# Patient Record
Sex: Female | Born: 1960 | Race: Black or African American | Hispanic: No | Marital: Married | Smoking: Never smoker
Health system: Southern US, Community
[De-identification: ages and names within clinical notes are randomized; demographics above are authoritative.]

## PROBLEM LIST (undated history)

## (undated) DIAGNOSIS — K219 Gastro-esophageal reflux disease without esophagitis: Secondary | ICD-10-CM

## (undated) DIAGNOSIS — A419 Sepsis, unspecified organism: Secondary | ICD-10-CM

## (undated) DIAGNOSIS — F988 Other specified behavioral and emotional disorders with onset usually occurring in childhood and adolescence: Secondary | ICD-10-CM

## (undated) DIAGNOSIS — E119 Type 2 diabetes mellitus without complications: Secondary | ICD-10-CM

## (undated) DIAGNOSIS — G4733 Obstructive sleep apnea (adult) (pediatric): Secondary | ICD-10-CM

## (undated) DIAGNOSIS — E7800 Pure hypercholesterolemia, unspecified: Secondary | ICD-10-CM

## (undated) DIAGNOSIS — M549 Dorsalgia, unspecified: Secondary | ICD-10-CM

## (undated) DIAGNOSIS — C73 Malignant neoplasm of thyroid gland: Secondary | ICD-10-CM

## (undated) DIAGNOSIS — Z923 Personal history of irradiation: Secondary | ICD-10-CM

## (undated) DIAGNOSIS — D649 Anemia, unspecified: Secondary | ICD-10-CM

## (undated) DIAGNOSIS — C50919 Malignant neoplasm of unspecified site of unspecified female breast: Secondary | ICD-10-CM

## (undated) DIAGNOSIS — M1990 Unspecified osteoarthritis, unspecified site: Secondary | ICD-10-CM

## (undated) DIAGNOSIS — K760 Fatty (change of) liver, not elsewhere classified: Secondary | ICD-10-CM

## (undated) DIAGNOSIS — I10 Essential (primary) hypertension: Secondary | ICD-10-CM

## (undated) DIAGNOSIS — E042 Nontoxic multinodular goiter: Secondary | ICD-10-CM

## (undated) DIAGNOSIS — K429 Umbilical hernia without obstruction or gangrene: Secondary | ICD-10-CM

## (undated) DIAGNOSIS — F419 Anxiety disorder, unspecified: Secondary | ICD-10-CM

## (undated) DIAGNOSIS — E559 Vitamin D deficiency, unspecified: Secondary | ICD-10-CM

## (undated) DIAGNOSIS — Z91018 Allergy to other foods: Secondary | ICD-10-CM

## (undated) DIAGNOSIS — F32A Depression, unspecified: Secondary | ICD-10-CM

## (undated) DIAGNOSIS — F329 Major depressive disorder, single episode, unspecified: Secondary | ICD-10-CM

## (undated) DIAGNOSIS — A403 Sepsis due to Streptococcus pneumoniae: Secondary | ICD-10-CM

## (undated) DIAGNOSIS — E039 Hypothyroidism, unspecified: Secondary | ICD-10-CM

## (undated) DIAGNOSIS — R6521 Severe sepsis with septic shock: Secondary | ICD-10-CM

## (undated) DIAGNOSIS — Z9989 Dependence on other enabling machines and devices: Secondary | ICD-10-CM

## (undated) DIAGNOSIS — M2550 Pain in unspecified joint: Secondary | ICD-10-CM

## (undated) DIAGNOSIS — G43909 Migraine, unspecified, not intractable, without status migrainosus: Secondary | ICD-10-CM

## (undated) DIAGNOSIS — J189 Pneumonia, unspecified organism: Secondary | ICD-10-CM

## (undated) HISTORY — PX: ABDOMINAL HYSTERECTOMY: SHX81

## (undated) HISTORY — DX: Malignant neoplasm of unspecified site of unspecified female breast: C50.919

## (undated) HISTORY — DX: Pain in unspecified joint: M25.50

## (undated) HISTORY — DX: Anxiety disorder, unspecified: F41.9

## (undated) HISTORY — DX: Major depressive disorder, single episode, unspecified: F32.9

## (undated) HISTORY — DX: Fatty (change of) liver, not elsewhere classified: K76.0

## (undated) HISTORY — DX: Other specified behavioral and emotional disorders with onset usually occurring in childhood and adolescence: F98.8

## (undated) HISTORY — DX: Dorsalgia, unspecified: M54.9

## (undated) HISTORY — DX: Sepsis, unspecified organism: A41.9

## (undated) HISTORY — DX: Essential (primary) hypertension: I10

## (undated) HISTORY — DX: Sepsis, unspecified organism: R65.21

## (undated) HISTORY — DX: Allergy to other foods: Z91.018

## (undated) HISTORY — DX: Sepsis due to Streptococcus pneumoniae: A40.3

## (undated) HISTORY — DX: Vitamin D deficiency, unspecified: E55.9

## (undated) HISTORY — PX: BREAST REDUCTION SURGERY: SHX8

## (undated) HISTORY — DX: Anemia, unspecified: D64.9

## (undated) HISTORY — DX: Depression, unspecified: F32.A

## (undated) HISTORY — DX: Migraine, unspecified, not intractable, without status migrainosus: G43.909

## (undated) HISTORY — DX: Umbilical hernia without obstruction or gangrene: K42.9

---

## 1984-07-16 HISTORY — PX: DIAPHRAGM SURGERY: SHX612

## 1993-07-16 HISTORY — PX: REDUCTION MAMMAPLASTY: SUR839

## 1998-06-17 ENCOUNTER — Encounter: Payer: Self-pay | Admitting: Obstetrics and Gynecology

## 1998-06-17 ENCOUNTER — Ambulatory Visit (HOSPITAL_COMMUNITY): Admission: RE | Admit: 1998-06-17 | Discharge: 1998-06-17 | Payer: Self-pay | Admitting: Obstetrics and Gynecology

## 1998-12-19 ENCOUNTER — Emergency Department (HOSPITAL_COMMUNITY): Admission: EM | Admit: 1998-12-19 | Discharge: 1998-12-19 | Payer: Self-pay | Admitting: Emergency Medicine

## 1998-12-20 ENCOUNTER — Emergency Department (HOSPITAL_COMMUNITY): Admission: EM | Admit: 1998-12-20 | Discharge: 1998-12-20 | Payer: Self-pay | Admitting: Emergency Medicine

## 2000-05-22 ENCOUNTER — Encounter (HOSPITAL_BASED_OUTPATIENT_CLINIC_OR_DEPARTMENT_OTHER): Payer: Self-pay | Admitting: General Surgery

## 2000-05-22 ENCOUNTER — Encounter: Admission: RE | Admit: 2000-05-22 | Discharge: 2000-05-22 | Payer: Self-pay | Admitting: General Surgery

## 2000-10-30 ENCOUNTER — Emergency Department (HOSPITAL_COMMUNITY): Admission: EM | Admit: 2000-10-30 | Discharge: 2000-10-30 | Payer: Self-pay

## 2001-03-20 ENCOUNTER — Emergency Department (HOSPITAL_COMMUNITY): Admission: EM | Admit: 2001-03-20 | Discharge: 2001-03-20 | Payer: Self-pay | Admitting: Emergency Medicine

## 2006-04-26 ENCOUNTER — Emergency Department (HOSPITAL_COMMUNITY): Admission: EM | Admit: 2006-04-26 | Discharge: 2006-04-26 | Payer: Self-pay | Admitting: Family Medicine

## 2007-07-17 HISTORY — PX: BREAST LUMPECTOMY: SHX2

## 2008-04-15 ENCOUNTER — Encounter: Admission: RE | Admit: 2008-04-15 | Discharge: 2008-04-15 | Payer: Self-pay | Admitting: Internal Medicine

## 2008-05-03 ENCOUNTER — Encounter: Admission: RE | Admit: 2008-05-03 | Discharge: 2008-05-03 | Payer: Self-pay | Admitting: Internal Medicine

## 2008-05-21 ENCOUNTER — Encounter: Admission: RE | Admit: 2008-05-21 | Discharge: 2008-05-21 | Payer: Self-pay | Admitting: General Surgery

## 2008-05-24 ENCOUNTER — Encounter (HOSPITAL_BASED_OUTPATIENT_CLINIC_OR_DEPARTMENT_OTHER): Payer: Self-pay | Admitting: General Surgery

## 2008-05-24 ENCOUNTER — Ambulatory Visit (HOSPITAL_BASED_OUTPATIENT_CLINIC_OR_DEPARTMENT_OTHER): Admission: RE | Admit: 2008-05-24 | Discharge: 2008-05-24 | Payer: Self-pay | Admitting: General Surgery

## 2008-05-24 ENCOUNTER — Encounter: Admission: RE | Admit: 2008-05-24 | Discharge: 2008-05-24 | Payer: Self-pay | Admitting: General Surgery

## 2008-06-08 ENCOUNTER — Ambulatory Visit: Payer: Self-pay | Admitting: Oncology

## 2008-06-09 ENCOUNTER — Ambulatory Visit: Admission: RE | Admit: 2008-06-09 | Discharge: 2008-08-26 | Payer: Self-pay | Admitting: Radiation Oncology

## 2008-07-19 LAB — COMPREHENSIVE METABOLIC PANEL
ALT: 23 U/L (ref 0–35)
AST: 20 U/L (ref 0–37)
Albumin: 4.3 g/dL (ref 3.5–5.2)
Alkaline Phosphatase: 55 U/L (ref 39–117)
BUN: 10 mg/dL (ref 6–23)
CO2: 22 mEq/L (ref 19–32)
Calcium: 9.6 mg/dL (ref 8.4–10.5)
Chloride: 99 mEq/L (ref 96–112)
Creatinine, Ser: 0.81 mg/dL (ref 0.40–1.20)
Glucose, Bld: 151 mg/dL — ABNORMAL HIGH (ref 70–99)
Potassium: 4.1 mEq/L (ref 3.5–5.3)
Sodium: 135 mEq/L (ref 135–145)
Total Bilirubin: 0.3 mg/dL (ref 0.3–1.2)
Total Protein: 7.7 g/dL (ref 6.0–8.3)

## 2008-10-26 ENCOUNTER — Ambulatory Visit: Payer: Self-pay | Admitting: Oncology

## 2009-03-01 ENCOUNTER — Ambulatory Visit: Payer: Self-pay | Admitting: Oncology

## 2009-03-03 LAB — FOLLICLE STIMULATING HORMONE: FSH: 46.8 m[IU]/mL

## 2009-03-03 LAB — LUTEINIZING HORMONE: LH: 39.1 m[IU]/mL

## 2009-03-13 LAB — ESTRADIOL, ULTRA SENS

## 2009-06-13 ENCOUNTER — Ambulatory Visit: Payer: Self-pay | Admitting: Oncology

## 2009-06-14 ENCOUNTER — Encounter: Admission: RE | Admit: 2009-06-14 | Discharge: 2009-06-14 | Payer: Self-pay | Admitting: Oncology

## 2009-06-15 LAB — CBC WITH DIFFERENTIAL/PLATELET
BASO%: 0.2 % (ref 0.0–2.0)
Basophils Absolute: 0 10*3/uL (ref 0.0–0.1)
EOS%: 2.3 % (ref 0.0–7.0)
Eosinophils Absolute: 0.2 10*3/uL (ref 0.0–0.5)
HCT: 39.2 % (ref 34.8–46.6)
HGB: 12.7 g/dL (ref 11.6–15.9)
LYMPH%: 12.5 % — ABNORMAL LOW (ref 14.0–49.7)
MCH: 24.9 pg — ABNORMAL LOW (ref 25.1–34.0)
MCHC: 32.5 g/dL (ref 31.5–36.0)
MCV: 76.8 fL — ABNORMAL LOW (ref 79.5–101.0)
MONO#: 0.7 10*3/uL (ref 0.1–0.9)
MONO%: 8.1 % (ref 0.0–14.0)
NEUT#: 6.5 10*3/uL (ref 1.5–6.5)
NEUT%: 76.9 % — ABNORMAL HIGH (ref 38.4–76.8)
Platelets: 272 10*3/uL (ref 145–400)
RBC: 5.11 10*6/uL (ref 3.70–5.45)
RDW: 16.6 % — ABNORMAL HIGH (ref 11.2–14.5)
WBC: 8.4 10*3/uL (ref 3.9–10.3)
lymph#: 1.1 10*3/uL (ref 0.9–3.3)

## 2009-06-15 LAB — COMPREHENSIVE METABOLIC PANEL
ALT: 54 U/L — ABNORMAL HIGH (ref 0–35)
AST: 31 U/L (ref 0–37)
Albumin: 4.3 g/dL (ref 3.5–5.2)
Alkaline Phosphatase: 60 U/L (ref 39–117)
BUN: 22 mg/dL (ref 6–23)
CO2: 29 mEq/L (ref 19–32)
Calcium: 9.8 mg/dL (ref 8.4–10.5)
Chloride: 97 mEq/L (ref 96–112)
Creatinine, Ser: 0.96 mg/dL (ref 0.40–1.20)
Glucose, Bld: 260 mg/dL — ABNORMAL HIGH (ref 70–99)
Potassium: 4.2 mEq/L (ref 3.5–5.3)
Sodium: 137 mEq/L (ref 135–145)
Total Bilirubin: 0.2 mg/dL — ABNORMAL LOW (ref 0.3–1.2)
Total Protein: 7.2 g/dL (ref 6.0–8.3)

## 2009-06-15 LAB — FOLLICLE STIMULATING HORMONE: FSH: 64.9 m[IU]/mL

## 2009-06-27 LAB — ESTRADIOL, ULTRA SENS: Estradiol, Ultra Sensitive: <2 pg/mL

## 2010-06-09 ENCOUNTER — Ambulatory Visit: Payer: Self-pay | Admitting: Oncology

## 2010-06-16 ENCOUNTER — Encounter: Admission: RE | Admit: 2010-06-16 | Discharge: 2010-06-16 | Payer: Self-pay | Admitting: Oncology

## 2010-06-19 LAB — CBC WITH DIFFERENTIAL/PLATELET
BASO%: 0.4 % (ref 0.0–2.0)
Basophils Absolute: 0 10*3/uL (ref 0.0–0.1)
EOS%: 3.8 % (ref 0.0–7.0)
Eosinophils Absolute: 0.3 10*3/uL (ref 0.0–0.5)
HCT: 39.6 % (ref 34.8–46.6)
HGB: 12.8 g/dL (ref 11.6–15.9)
LYMPH%: 14.4 % (ref 14.0–49.7)
MCH: 24.3 pg — ABNORMAL LOW (ref 25.1–34.0)
MCHC: 32.3 g/dL (ref 31.5–36.0)
MCV: 75.1 fL — ABNORMAL LOW (ref 79.5–101.0)
MONO#: 0.5 10*3/uL (ref 0.1–0.9)
MONO%: 6.6 % (ref 0.0–14.0)
NEUT#: 6.2 10*3/uL (ref 1.5–6.5)
NEUT%: 74.8 % (ref 38.4–76.8)
Platelets: 320 10*3/uL (ref 145–400)
RBC: 5.27 10*6/uL (ref 3.70–5.45)
RDW: 15.5 % — ABNORMAL HIGH (ref 11.2–14.5)
WBC: 8.3 10*3/uL (ref 3.9–10.3)
lymph#: 1.2 10*3/uL (ref 0.9–3.3)

## 2010-06-19 LAB — COMPREHENSIVE METABOLIC PANEL
ALT: 38 U/L — ABNORMAL HIGH (ref 0–35)
AST: 26 U/L (ref 0–37)
Albumin: 4 g/dL (ref 3.5–5.2)
Alkaline Phosphatase: 57 U/L (ref 39–117)
BUN: 20 mg/dL (ref 6–23)
CO2: 32 mEq/L (ref 19–32)
Calcium: 9.7 mg/dL (ref 8.4–10.5)
Chloride: 100 mEq/L (ref 96–112)
Creatinine, Ser: 1.03 mg/dL (ref 0.40–1.20)
Glucose, Bld: 203 mg/dL — ABNORMAL HIGH (ref 70–99)
Potassium: 4 mEq/L (ref 3.5–5.3)
Sodium: 141 mEq/L (ref 135–145)
Total Bilirubin: 0.3 mg/dL (ref 0.3–1.2)
Total Protein: 7.3 g/dL (ref 6.0–8.3)

## 2010-06-19 LAB — FOLLICLE STIMULATING HORMONE: FSH: 54.3 m[IU]/mL

## 2010-06-19 LAB — CANCER ANTIGEN 27.29: CA 27.29: 25 U/mL (ref 0–39)

## 2010-06-19 LAB — VITAMIN D 25 HYDROXY (VIT D DEFICIENCY, FRACTURES): Vit D, 25-Hydroxy: 66 ng/mL (ref 30–89)

## 2010-06-24 LAB — ESTRADIOL, ULTRA SENS: Estradiol, Ultra Sensitive: 24 pg/mL

## 2010-11-28 NOTE — Op Note (Signed)
Brittany, [name]:  [id number]   MEDICAL RECORD NO.:  [id number]          PATIENT TYPE:  AMB   LOCATION:  DSC                          FACILITY:  [facility]   PHYSICIAN:  Leonie Man, M.D.   DATE OF BIRTH:  01/21/1961   DATE OF PROCEDURE:  05/24/2008  DATE OF DISCHARGE:                               OPERATIVE REPORT   PREOPERATIVE DIAGNOSIS:  Papilloma of the left breast.   POSTOPERATIVE DIAGNOSIS:  Papilloma of the left breast.   PATHOLOGY:  Pending.   PROCEDURE:  Needle localized excision of left breast lesion.   SURGEON:  Leonie Man, MD   ASSISTANT:  OR nurse.   ANESTHESIA:  General.   NOTES AND INDICATIONS:  Ms. Badgett is a 50 year old female who is status  post excisional biopsy of the left breast in the remote past for  papillomatosis.  She is currently status post bilateral reduction  mammoplasties and on mammogram in early October 2009 was seen to have an  area of pleomorphic calcifications in the upper outer quadrant of the  breast.  The calcifications were biopsied by stereotactic core biopsy  with pathology showing a papilloma and addition to this, there was some  columnar ulceration without atypia, apocrine metaplasia and duct  ectasia.  There was also some a lobular hyperplasia without atypia.   Because of the presence of the intraductal papilloma, she comes now to  the operating room following needle localization for excision of this  lesion.   PROCEDURE:  The patient was positioned supinely following the induction  of general endotracheal anesthesia.  The left breast was prepped and  draped to be included in a sterile operative field.  The needle  insertion site was somewhat high and close to the anterior axillary  line.  I made an incision below this into the breast tissue, deepened  this through the skin and into the subcutaneous tissue raising a flap  toward the localizing needle.  The localizing needle now brought down  into the wound.  Large wedge of breast tissue was taken with the  localizing needle.  This was forwarded for pathologic evaluation after  specimen mammography showed the lesion to be centrally placed in the  breast and the clip was present.   Hemostasis was then obtained with electrocautery.  Sponge and instrument  counts to pathology verified.  Incision closed in three layers using  interrupted 2-0 Vicryl sutures in the deep breast layers, interrupted 3-  0 Vicryl in the  subcutaneous layers and 5-0 Monocryl in the skin.  This was reinforced  with Dermabond and Steri-Strips.  Sterile dressings were applied.  The  anesthetic reversed.  The patient removed from the operating room to the  recovery room in stable condition.  She tolerated the procedure well.      Leonie Man, M.D.  Electronically Signed     PB/MEDQ  D:  05/24/2008  T:  05/25/2008  Job:  [id number]

## 2011-04-17 LAB — COMPREHENSIVE METABOLIC PANEL
ALT: 19
AST: 21
Albumin: 3.6
Alkaline Phosphatase: 50
BUN: 8
CO2: 31
Calcium: 9.3
Chloride: 98
Creatinine, Ser: 0.71
GFR calc Af Amer: >60
GFR calc non Af Amer: >60
Glucose, Bld: 243 — ABNORMAL HIGH
Potassium: 4.3
Sodium: 135
Total Bilirubin: 0.6
Total Protein: 7.1

## 2011-04-17 LAB — DIFFERENTIAL
Basophils Absolute: 0
Basophils Relative: 0
Eosinophils Absolute: 0.2
Eosinophils Relative: 3
Lymphocytes Relative: 22
Lymphs Abs: 2.1
Monocytes Absolute: 0.7
Monocytes Relative: 7
Neutro Abs: 6.5
Neutrophils Relative %: 68

## 2011-04-17 LAB — CBC
HCT: 39.4
Hemoglobin: 12.3
MCHC: 31.3
MCV: 76.4 — ABNORMAL LOW
Platelets: 315
RBC: 5.16 — ABNORMAL HIGH
RDW: 15.8 — ABNORMAL HIGH
WBC: 9.5

## 2011-04-17 LAB — GLUCOSE, CAPILLARY
Glucose-Capillary: 152 — ABNORMAL HIGH
Glucose-Capillary: 170 — ABNORMAL HIGH

## 2011-06-14 ENCOUNTER — Other Ambulatory Visit: Payer: Self-pay | Admitting: Oncology

## 2011-06-14 DIAGNOSIS — Z853 Personal history of malignant neoplasm of breast: Secondary | ICD-10-CM

## 2011-06-29 ENCOUNTER — Ambulatory Visit
Admission: RE | Admit: 2011-06-29 | Discharge: 2011-06-29 | Disposition: A | Discharge status: Home / Self Care | Payer: BC Managed Care – PPO | Source: Ambulatory Visit | Attending: Oncology | Admitting: Oncology

## 2011-06-29 DIAGNOSIS — Z853 Personal history of malignant neoplasm of breast: Secondary | ICD-10-CM

## 2011-07-05 ENCOUNTER — Other Ambulatory Visit: Payer: BC Managed Care – PPO | Admitting: Lab

## 2011-07-05 ENCOUNTER — Other Ambulatory Visit: Payer: Self-pay | Admitting: Oncology

## 2011-07-05 ENCOUNTER — Ambulatory Visit (HOSPITAL_BASED_OUTPATIENT_CLINIC_OR_DEPARTMENT_OTHER): Payer: BC Managed Care – PPO | Admitting: Oncology

## 2011-07-05 VITALS — BP 143/82 | HR 85 | Temp 98.6°F | Ht 65.0 in | Wt 253.1 lb

## 2011-07-05 DIAGNOSIS — D0590 Unspecified type of carcinoma in situ of unspecified breast: Secondary | ICD-10-CM

## 2011-07-05 DIAGNOSIS — C50419 Malignant neoplasm of upper-outer quadrant of unspecified female breast: Secondary | ICD-10-CM

## 2011-07-05 DIAGNOSIS — K219 Gastro-esophageal reflux disease without esophagitis: Secondary | ICD-10-CM

## 2011-07-05 DIAGNOSIS — Z853 Personal history of malignant neoplasm of breast: Secondary | ICD-10-CM

## 2011-07-05 DIAGNOSIS — Z170 Estrogen receptor positive status [ER+]: Secondary | ICD-10-CM

## 2011-07-05 DIAGNOSIS — C50919 Malignant neoplasm of unspecified site of unspecified female breast: Secondary | ICD-10-CM | POA: Insufficient documentation

## 2011-07-05 DIAGNOSIS — Z923 Personal history of irradiation: Secondary | ICD-10-CM

## 2011-07-05 LAB — CBC WITH DIFFERENTIAL/PLATELET
BASO%: 0.2 % (ref 0.0–2.0)
Basophils Absolute: 0 10*3/uL (ref 0.0–0.1)
EOS%: 3.9 % (ref 0.0–7.0)
Eosinophils Absolute: 0.3 10*3/uL (ref 0.0–0.5)
HCT: 38.6 % (ref 34.8–46.6)
HGB: 12.4 g/dL (ref 11.6–15.9)
LYMPH%: 14.8 % (ref 14.0–49.7)
MCH: 23.7 pg — ABNORMAL LOW (ref 25.1–34.0)
MCHC: 32.1 g/dL (ref 31.5–36.0)
MCV: 73.8 fL — ABNORMAL LOW (ref 79.5–101.0)
MONO#: 0.7 10*3/uL (ref 0.1–0.9)
MONO%: 9.4 % (ref 0.0–14.0)
NEUT#: 5.5 10*3/uL (ref 1.5–6.5)
NEUT%: 71.7 % (ref 38.4–76.8)
Platelets: 253 10*3/uL (ref 145–400)
RBC: 5.23 10*6/uL (ref 3.70–5.45)
RDW: 16.4 % — ABNORMAL HIGH (ref 11.2–14.5)
WBC: 7.6 10*3/uL (ref 3.9–10.3)
lymph#: 1.1 10*3/uL (ref 0.9–3.3)

## 2011-07-05 LAB — FERRITIN: Ferritin: 25 ng/mL (ref 10–291)
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
## 2011-07-06 ENCOUNTER — Telehealth: Payer: Self-pay | Admitting: *Deleted

## 2011-07-06 NOTE — Telephone Encounter (Signed)
gave patient appointment for 11-2011 with the labs a week before doctor's visit

## 2011-11-19 ENCOUNTER — Other Ambulatory Visit (HOSPITAL_BASED_OUTPATIENT_CLINIC_OR_DEPARTMENT_OTHER): Payer: BC Managed Care – PPO | Admitting: Lab

## 2011-11-19 DIAGNOSIS — C50919 Malignant neoplasm of unspecified site of unspecified female breast: Secondary | ICD-10-CM

## 2011-11-19 DIAGNOSIS — C50419 Malignant neoplasm of upper-outer quadrant of unspecified female breast: Secondary | ICD-10-CM

## 2011-11-19 DIAGNOSIS — Z853 Personal history of malignant neoplasm of breast: Secondary | ICD-10-CM

## 2011-11-19 LAB — COMPREHENSIVE METABOLIC PANEL
ALT: 49 U/L — ABNORMAL HIGH (ref 0–35)
AST: 33 U/L (ref 0–37)
Albumin: 4.1 g/dL (ref 3.5–5.2)
Alkaline Phosphatase: 56 U/L (ref 39–117)
BUN: 16 mg/dL (ref 6–23)
CO2: 32 mEq/L (ref 19–32)
Calcium: 9.3 mg/dL (ref 8.4–10.5)
Chloride: 97 mEq/L (ref 96–112)
Creatinine, Ser: 0.95 mg/dL (ref 0.50–1.10)
Glucose, Bld: 331 mg/dL — ABNORMAL HIGH (ref 70–99)
Potassium: 4.3 mEq/L (ref 3.5–5.3)
Sodium: 136 mEq/L (ref 135–145)
Total Bilirubin: 0.3 mg/dL (ref 0.3–1.2)
Total Protein: 7.2 g/dL (ref 6.0–8.3)

## 2011-11-19 LAB — CBC WITH DIFFERENTIAL/PLATELET
BASO%: 0.4 % (ref 0.0–2.0)
Basophils Absolute: 0 10*3/uL (ref 0.0–0.1)
EOS%: 3.3 % (ref 0.0–7.0)
Eosinophils Absolute: 0.3 10*3/uL (ref 0.0–0.5)
HCT: 39.9 % (ref 34.8–46.6)
HGB: 12.7 g/dL (ref 11.6–15.9)
LYMPH%: 17.5 % (ref 14.0–49.7)
MCH: 23.4 pg — ABNORMAL LOW (ref 25.1–34.0)
MCHC: 31.8 g/dL (ref 31.5–36.0)
MCV: 73.6 fL — ABNORMAL LOW (ref 79.5–101.0)
MONO#: 0.8 10*3/uL (ref 0.1–0.9)
MONO%: 9.2 % (ref 0.0–14.0)
NEUT#: 5.8 10*3/uL (ref 1.5–6.5)
NEUT%: 69.6 % (ref 38.4–76.8)
Platelets: 283 10*3/uL (ref 145–400)
RBC: 5.43 10*6/uL (ref 3.70–5.45)
RDW: 16.6 % — ABNORMAL HIGH (ref 11.2–14.5)
WBC: 8.4 10*3/uL (ref 3.9–10.3)
lymph#: 1.5 10*3/uL (ref 0.9–3.3)
nRBC: 0 % (ref 0–0)

## 2011-11-26 ENCOUNTER — Telehealth: Payer: Self-pay | Admitting: *Deleted

## 2011-11-26 ENCOUNTER — Encounter: Payer: Self-pay | Admitting: Physician Assistant

## 2011-11-26 ENCOUNTER — Ambulatory Visit (HOSPITAL_BASED_OUTPATIENT_CLINIC_OR_DEPARTMENT_OTHER): Payer: BC Managed Care – PPO | Admitting: Physician Assistant

## 2011-11-26 VITALS — BP 151/91 | HR 103 | Temp 98.5°F | Ht 65.0 in | Wt 251.1 lb

## 2011-11-26 DIAGNOSIS — C50919 Malignant neoplasm of unspecified site of unspecified female breast: Secondary | ICD-10-CM

## 2011-11-26 DIAGNOSIS — C50419 Malignant neoplasm of upper-outer quadrant of unspecified female breast: Secondary | ICD-10-CM

## 2011-11-26 DIAGNOSIS — E119 Type 2 diabetes mellitus without complications: Secondary | ICD-10-CM

## 2011-11-26 DIAGNOSIS — Z853 Personal history of malignant neoplasm of breast: Secondary | ICD-10-CM

## 2011-11-26 DIAGNOSIS — I10 Essential (primary) hypertension: Secondary | ICD-10-CM | POA: Insufficient documentation

## 2011-11-26 DIAGNOSIS — G43909 Migraine, unspecified, not intractable, without status migrainosus: Secondary | ICD-10-CM | POA: Insufficient documentation

## 2011-11-26 HISTORY — DX: Essential (primary) hypertension: I10

## 2011-11-26 NOTE — Telephone Encounter (Signed)
made patient appointment for mammogram at the breast center in 06-2012 gave patient appointment for 2014 printed out calendar and gave to the patient
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
## 2012-06-20 ENCOUNTER — Telehealth: Payer: Self-pay | Admitting: Emergency Medicine

## 2012-06-20 NOTE — Telephone Encounter (Signed)
Patient called with complaints of "bloody discharge in right nipple".  Per Dr Darnelle Catalan, mammogram to be rescheduled ASAP.   Patient has appointment for 12/16.   At this time [facility] for [facility] next week.

## 2012-06-24 ENCOUNTER — Telehealth: Payer: Self-pay | Admitting: Emergency Medicine

## 2012-06-24 NOTE — Telephone Encounter (Signed)
Per GM, instructed patient to keep her mammogram as scheduled for 12/16.  Instructed patient to call with any concerns in the meantime.

## 2012-06-26 ENCOUNTER — Other Ambulatory Visit: Payer: Self-pay | Admitting: *Deleted

## 2012-06-30 ENCOUNTER — Other Ambulatory Visit: Payer: Self-pay | Admitting: Physician Assistant

## 2012-06-30 ENCOUNTER — Ambulatory Visit
Admission: RE | Admit: 2012-06-30 | Discharge: 2012-06-30 | Disposition: A | Discharge status: Home / Self Care | Payer: BC Managed Care – PPO | Source: Ambulatory Visit | Attending: Physician Assistant | Admitting: Physician Assistant

## 2012-06-30 DIAGNOSIS — N6310 Unspecified lump in the right breast, unspecified quadrant: Secondary | ICD-10-CM

## 2012-06-30 DIAGNOSIS — Z853 Personal history of malignant neoplasm of breast: Secondary | ICD-10-CM

## 2012-06-30 DIAGNOSIS — C50919 Malignant neoplasm of unspecified site of unspecified female breast: Secondary | ICD-10-CM

## 2012-07-11 ENCOUNTER — Ambulatory Visit
Admission: RE | Admit: 2012-07-11 | Discharge: 2012-07-11 | Disposition: A | Discharge status: Home / Self Care | Payer: BC Managed Care – PPO | Source: Ambulatory Visit | Attending: Physician Assistant | Admitting: Physician Assistant

## 2012-07-11 DIAGNOSIS — Z853 Personal history of malignant neoplasm of breast: Secondary | ICD-10-CM

## 2012-07-11 DIAGNOSIS — N6310 Unspecified lump in the right breast, unspecified quadrant: Secondary | ICD-10-CM

## 2012-07-11 DIAGNOSIS — C50919 Malignant neoplasm of unspecified site of unspecified female breast: Secondary | ICD-10-CM

## 2012-07-16 HISTORY — PX: BREAST EXCISIONAL BIOPSY: SUR124

## 2012-07-16 HISTORY — PX: BREAST LUMPECTOMY: SHX2

## 2012-07-18 ENCOUNTER — Ambulatory Visit (INDEPENDENT_AMBULATORY_CARE_PROVIDER_SITE_OTHER): Payer: BC Managed Care – PPO | Admitting: Surgery

## 2012-07-18 ENCOUNTER — Encounter (INDEPENDENT_AMBULATORY_CARE_PROVIDER_SITE_OTHER): Payer: Self-pay | Admitting: Surgery

## 2012-07-18 VITALS — BP 140/88 | HR 80 | Temp 97.3°F | Resp 18 | Ht 65.0 in | Wt 250.6 lb

## 2012-07-18 DIAGNOSIS — D249 Benign neoplasm of unspecified breast: Secondary | ICD-10-CM

## 2012-07-18 NOTE — Progress Notes (Signed)
Patient ID: Brittany Rubio, female   DOB: 04/18/1961, 51 y.o.   MRN: [id number]  Chief Complaint  Patient presents with  . Pre-op Exam    eval R br    HPI Brittany Rubio is a 51 y.o. female.  Patient sent at the request of Dr. Jackson due to right breast nipple discharge which is bloody for 2 months. 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 Jackson in the right breast. Denies any history of breast mass, and his chronic breast pain from previous breast reduction surgery. She had a previous left breast papilloma excised in 2009. HPI  Past Medical History  Diagnosis Date  . Hypertension 11/26/2011  . Diabetes mellitus 11/26/2011  . Migraines 11/26/2011  . Cancer     Breast    Past Surgical History  Procedure Date  . Breast surgery 1995    Breast Reduction  . Breast surgery 2009    Lumpectomy  . Ruptured dia   . Diaphragm surgery 1986  . Abdominal hysterectomy ? 1997    Family History  Problem Relation Age of Onset  . Cancer Mother     Pancreatic    Social History History  Substance Use Topics  . Smoking status: Never Smoker   . Smokeless tobacco: Never Used  . Alcohol Use: Yes     Comment: social    No Known Allergies  Current Outpatient Prescriptions  Medication Sig Dispense Refill  . butorphanol (STADOL) 10 MG/ML nasal spray Place 1 spray into the nose every 4 (four) hours as needed.        . diltiazem (MATZIM LA) 240 MG 24 hr tablet Take 240 mg by mouth daily.      . levocetirizine (XYZAL) 5 MG tablet Take 5 mg by mouth every evening.      . lisinopril-hydrochlorothiazide (PRINZIDE,ZESTORETIC) 20-12.5 MG per tablet Take 1 tablet by mouth daily.        . promethazine (PHENERGAN) 25 MG tablet Take 25 mg by mouth every 6 (six) hours as needed.        . Saxagliptin-Metformin (KOMBIGLYZE XR) 2.11-998 MG TB24 Take 2 tablets by mouth daily.       . sitaGLIPtin (JANUVIA) 25 MG tablet Take 25 mg  by mouth.        Review of Systems Review of Systems  Constitutional: Negative for fever, chills and unexpected weight change.  HENT: Negative for hearing loss, congestion, sore throat, trouble swallowing and voice change.   Eyes: Negative for visual disturbance.  Respiratory: Negative for cough and wheezing.   Cardiovascular: Negative for chest pain, palpitations and leg swelling.  Gastrointestinal: Negative for nausea, vomiting, abdominal pain, diarrhea, constipation, blood in stool, abdominal distention and anal bleeding.  Genitourinary: Negative for hematuria, vaginal bleeding and difficulty urinating.  Musculoskeletal: Negative for arthralgias.  Skin: Negative for rash and wound.  Neurological: Negative for seizures, syncope and headaches.  Hematological: Negative for adenopathy. Does not bruise/bleed easily.  Psychiatric/Behavioral: Negative for confusion.    Blood pressure 140/88, pulse 80, temperature 97.3 F (36.3 C), temperature source Temporal, resp. rate 18, height 5' 5" (1.651 m), weight 250 lb 9.6 oz (113.671 kg).  Physical Exam Physical Exam  Constitutional: She is oriented to person, place, and time. She appears well-developed and well-nourished.  HENT:  Head: Normocephalic and atraumatic.  Eyes: EOM are normal. Pupils are equal, round, and reactive to light.  Neck: Normal range of motion. Neck supple.    Cardiovascular: Normal rate and regular rhythm.   Pulmonary/Chest: Right breast exhibits nipple discharge. Right breast exhibits no inverted nipple, no mass, no skin change and no tenderness. Left breast exhibits no inverted nipple, no mass, no nipple discharge and no tenderness.    Musculoskeletal: Normal range of motion.  Neurological: She is alert and oriented to person, place, and time.  Skin: Skin is warm and dry.  Psychiatric: She has a normal mood and affect. Her behavior is normal. Thought content normal.    Data Reviewed The pathology associated withthe  right breast ultrasound guided  core biopsy demonstrated an intraductal papilloma. There was no  evidence for malignancy. The pathology is concordant with imaging  findings. I have discussed the findings with the patient by  telephone and answered her questions. The patient states that her  biopsy site is clean and dry without hematoma formation. Post  biopsy wound care instructions were reviewed with the patient.  Surgical excision of the papilloma is recommended and the patient  has been scheduled to see Dr. Manessa Buley on 07/18/2012. The patient  was encouraged to call the [facility] for additional questions  or concerns.   Assessment    Right breast bloody nipple discharge and papilloma    Plan    Right breast partial mastectomy with needle localization The procedure has been discussed with the patient. Alternatives to surgery have been discussed with the patient.  Risks of surgery include bleeding,  Infection,  Seroma formation, death,  and the need for further surgery.   The patient understands and wishes to proceed.       Brittany Rubio A. 07/18/2012, 4:32 PM    

## 2012-07-18 NOTE — Patient Instructions (Signed)
Lumpectomy, Breast Conserving Surgery A lumpectomy is breast surgery that removes only part of the breast. Another name used may be partial mastectomy. The amount removed varies. Make sure you understand how much of your breast will be removed. Reasons for a lumpectomy:  Any solid breast mass.  Grouped significant nodularity that may be confused with a solitary breast mass. Lumpectomy is the most common form of breast cancer surgery today. The surgeon removes the portion of your breast which contains the tumor (cancer). This is the lump. Some normal tissue around the lump is also removed to be sure that all the tumor has been removed.  If cancer cells are found in the margins where the breast tissue was removed, your surgeon will do more surgery to remove the remaining cancer tissue. This is called re-excision surgery. Radiation and/or chemotherapy treatments are often given following a lumpectomy to kill any cancer cells that could possibly remain.  REASONS YOU MAY NOT BE ABLE TO HAVE BREAST CONSERVING SURGERY:  The tumor is located in more than one place.  Your breast is small and the tumor is large so the breast would be disfigured.  The entire tumor removal is not successful with a lumpectomy.  You cannot commit to a full course of chemotherapy, radiation therapy or are pregnant and cannot have radiation.  You have previously had radiation to the breast to treat cancer. HOW A LUMPECTOMY IS PERFORMED If overnight nursing is not required following a biopsy, a lumpectomy can be performed as a same-day surgery. This can be done in a hospital, clinic, or surgical center. The anesthesia used will depend on your surgeon. They will discuss this with you. A general anesthetic keeps you sleeping through the procedure. LET YOUR CAREGIVERS KNOW ABOUT THE FOLLOWING:  Allergies  Medications taken including herbs, eye drops, over the counter medications, and creams.  Use of steroids (by mouth or  creams)  Previous problems with anesthetics or Novocaine.  Possibility of pregnancy, if this applies  History of blood clots (thrombophlebitis)  History of bleeding or blood problems.  Previous surgery  Other health problems BEFORE THE PROCEDURE You should be present one hour prior to your procedure unless directed otherwise.  AFTER THE PROCEDURE  After surgery, you will be taken to the recovery area where a nurse will watch and check your progress. Once you're awake, stable, and taking fluids well, barring other problems you will be allowed to go home.  Ice packs applied to your operative site may help with discomfort and keep the swelling down.  A small rubber drain may be placed in the breast for a couple of days to prevent a hematoma from developing in the breast.  A pressure dressing may be applied for 24 to 48 hours to prevent bleeding.  Keep the wound dry.  You may resume a normal diet and activities as directed. Avoid strenuous activities affecting the arm on the side of the biopsy site such as tennis, swimming, heavy lifting (more than 10 pounds) or pulling.  Bruising in the breast is normal following this procedure.  Wearing a bra - even to bed - may be more comfortable and also help keep the dressing on.  Change dressings as directed.  Only take over-the-counter or prescription medicines for pain, discomfort, or fever as directed by your caregiver. Call for your results as instructed by your surgeon. Remember it is your responsibility to get the results of your lumpectomy if your surgeon asked you to follow-up. Do not assume   everything is fine if you have not heard from your caregiver. SEEK MEDICAL CARE IF:   There is increased bleeding (more than a small spot) from the wound.  You notice redness, swelling, or increasing pain in the wound.  Pus is coming from wound.  An unexplained oral temperature above 102 F (38.9 C) develops.  You notice a foul smell  coming from the wound or dressing. SEEK IMMEDIATE MEDICAL CARE IF:   You develop a rash.  You have difficulty breathing.  You have any allergic problems. Document Released: 08/13/2006 Document Revised: 09/24/2011 Document Reviewed: 11/14/2006 [employer] Patient Information 2013 [employer].  

## 2012-07-21 ENCOUNTER — Other Ambulatory Visit (INDEPENDENT_AMBULATORY_CARE_PROVIDER_SITE_OTHER): Payer: Self-pay | Admitting: Surgery

## 2012-07-31 ENCOUNTER — Other Ambulatory Visit (INDEPENDENT_AMBULATORY_CARE_PROVIDER_SITE_OTHER): Payer: Self-pay | Admitting: Surgery

## 2012-07-31 DIAGNOSIS — D249 Benign neoplasm of unspecified breast: Secondary | ICD-10-CM

## 2012-08-05 ENCOUNTER — Encounter (HOSPITAL_COMMUNITY)
Admission: RE | Admit: 2012-08-05 | Discharge: 2012-08-05 | Disposition: A | Discharge status: Home / Self Care | Payer: BC Managed Care – PPO | Source: Ambulatory Visit | Attending: Surgery | Admitting: Surgery

## 2012-08-05 ENCOUNTER — Encounter (HOSPITAL_COMMUNITY): Payer: Self-pay

## 2012-08-05 ENCOUNTER — Encounter (HOSPITAL_COMMUNITY)
Admission: RE | Admit: 2012-08-05 | Discharge: 2012-08-05 | Disposition: A | Discharge status: Home / Self Care | Payer: BC Managed Care – PPO | Source: Ambulatory Visit | Attending: Anesthesiology | Admitting: Anesthesiology

## 2012-08-05 HISTORY — DX: Unspecified osteoarthritis, unspecified site: M19.90

## 2012-08-05 HISTORY — DX: Gastro-esophageal reflux disease without esophagitis: K21.9

## 2012-08-05 LAB — CBC WITH DIFFERENTIAL/PLATELET
Basophils Absolute: 0 10*3/uL (ref 0.0–0.1)
Basophils Relative: 0 % (ref 0–1)
Eosinophils Absolute: 0.3 10*3/uL (ref 0.0–0.7)
Eosinophils Relative: 4 % (ref 0–5)
HCT: 40.3 % (ref 36.0–46.0)
Hemoglobin: 13.3 g/dL (ref 12.0–15.0)
Lymphocytes Relative: 27 % (ref 12–46)
Lymphs Abs: 2 10*3/uL (ref 0.7–4.0)
MCH: 24.2 pg — ABNORMAL LOW (ref 26.0–34.0)
MCHC: 33 g/dL (ref 30.0–36.0)
MCV: 73.4 fL — ABNORMAL LOW (ref 78.0–100.0)
Monocytes Absolute: 0.6 10*3/uL (ref 0.1–1.0)
Monocytes Relative: 8 % (ref 3–12)
Neutro Abs: 4.6 10*3/uL (ref 1.7–7.7)
Neutrophils Relative %: 62 % (ref 43–77)
Platelets: 261 10*3/uL (ref 150–400)
RBC: 5.49 MIL/uL — ABNORMAL HIGH (ref 3.87–5.11)
RDW: 15.5 % (ref 11.5–15.5)
WBC: 7.5 10*3/uL (ref 4.0–10.5)

## 2012-08-05 LAB — COMPREHENSIVE METABOLIC PANEL
ALT: 48 U/L — ABNORMAL HIGH (ref 0–35)
AST: 31 U/L (ref 0–37)
Albumin: 3.5 g/dL (ref 3.5–5.2)
Alkaline Phosphatase: 55 U/L (ref 39–117)
BUN: 12 mg/dL (ref 6–23)
CO2: 29 mEq/L (ref 19–32)
Calcium: 9.2 mg/dL (ref 8.4–10.5)
Chloride: 100 mEq/L (ref 96–112)
Creatinine, Ser: 0.67 mg/dL (ref 0.50–1.10)
GFR calc Af Amer: >90 mL/min (ref >90)
GFR calc non Af Amer: >90 mL/min (ref >90)
Glucose, Bld: 172 mg/dL — ABNORMAL HIGH (ref 70–99)
Potassium: 4.3 mEq/L (ref 3.5–5.1)
Sodium: 139 mEq/L (ref 135–145)
Total Bilirubin: 0.2 mg/dL — ABNORMAL LOW (ref 0.3–1.2)
Total Protein: 7.8 g/dL (ref 6.0–8.3)

## 2012-08-05 LAB — SURGICAL PCR SCREEN
MRSA, PCR: NEGATIVE
Staphylococcus aureus: POSITIVE — AB

## 2012-08-05 MED ORDER — CHLORHEXIDINE GLUCONATE 4 % EX LIQD: 1.0000 "application " | Freq: Once | CUTANEOUS | Status: DC

## 2012-08-05 NOTE — Progress Notes (Signed)
Contacted Dr. Allyne Gee office to request EKG.  Office stated no EKG's on record.  Forwarded EKG from 08/05/12 to anesthesia for review.

## 2012-08-05 NOTE — Pre-Procedure Instructions (Signed)
Brittany Rubio  08/05/2012   Your procedure is scheduled on:  Tuesday August 12, 2012  Report to [facility] at 12:30PM.  Call this number if you have problems the morning of surgery: [phone]   Remember:   Do not eat food or drink liquids after midnight.   Take these medicines the morning of surgery with A SIP OF WATER: stadol, diltiazem, phenergan (if needed),   Do not wear jewelry, make-up or nail polish.  Do not wear lotions, powders, or perfumes.   Do not shave 48 hours prior to surgery.  Do not bring valuables to the hospital.  Contacts, dentures or bridgework may not be worn into surgery.  Leave suitcase in the car. After surgery it may be brought to your room.  For patients admitted to the hospital, checkout time is 11:00 AM the day of  discharge.   Patients discharged the day of surgery will not be allowed to drive home.  Name and phone number of your driver: family / friend  Special Instructions: Shower using CHG 2 nights before surgery and the night before surgery.  If you shower the day of surgery use CHG.  Use special wash - you have one bottle of CHG for all showers.  You should use approximately 1/3 of the bottle for each shower.   Please read over the following fact sheets that you were given: Pain Booklet, Coughing and Deep Breathing, MRSA Information and Surgical Site Infection Prevention

## 2012-08-11 MED ORDER — DEXTROSE 5 % IV SOLN
3.0000 g | INTRAVENOUS | Status: DC
  Filled 2012-08-11: qty 3000

## 2012-08-12 ENCOUNTER — Other Ambulatory Visit (INDEPENDENT_AMBULATORY_CARE_PROVIDER_SITE_OTHER): Payer: Self-pay | Admitting: Surgery

## 2012-08-12 ENCOUNTER — Encounter (HOSPITAL_COMMUNITY)
Admission: RE | Disposition: A | Discharge status: Home / Self Care | Payer: Self-pay | Source: Ambulatory Visit | Attending: Surgery

## 2012-08-12 ENCOUNTER — Ambulatory Visit (HOSPITAL_COMMUNITY): Payer: BC Managed Care – PPO | Admitting: Certified Registered Nurse Anesthetist

## 2012-08-12 ENCOUNTER — Encounter (HOSPITAL_COMMUNITY): Payer: Self-pay | Admitting: Certified Registered Nurse Anesthetist

## 2012-08-12 ENCOUNTER — Ambulatory Visit
Admission: RE | Admit: 2012-08-12 | Discharge: 2012-08-12 | Disposition: A | Discharge status: Home / Self Care | Payer: BC Managed Care – PPO | Source: Ambulatory Visit | Attending: Surgery | Admitting: Surgery

## 2012-08-12 ENCOUNTER — Ambulatory Visit (HOSPITAL_COMMUNITY)
Admission: RE | Admit: 2012-08-12 | Discharge: 2012-08-12 | Disposition: A | Discharge status: Home / Self Care | Payer: BC Managed Care – PPO | Source: Ambulatory Visit | Attending: Surgery | Admitting: Surgery

## 2012-08-12 ENCOUNTER — Encounter (HOSPITAL_COMMUNITY): Payer: Self-pay | Admitting: *Deleted

## 2012-08-12 DIAGNOSIS — D249 Benign neoplasm of unspecified breast: Secondary | ICD-10-CM

## 2012-08-12 DIAGNOSIS — E119 Type 2 diabetes mellitus without complications: Secondary | ICD-10-CM | POA: Insufficient documentation

## 2012-08-12 DIAGNOSIS — Z01812 Encounter for preprocedural laboratory examination: Secondary | ICD-10-CM | POA: Insufficient documentation

## 2012-08-12 DIAGNOSIS — Z01810 Encounter for preprocedural cardiovascular examination: Secondary | ICD-10-CM | POA: Insufficient documentation

## 2012-08-12 DIAGNOSIS — N6089 Other benign mammary dysplasias of unspecified breast: Secondary | ICD-10-CM

## 2012-08-12 DIAGNOSIS — I10 Essential (primary) hypertension: Secondary | ICD-10-CM | POA: Insufficient documentation

## 2012-08-12 DIAGNOSIS — N63 Unspecified lump in unspecified breast: Secondary | ICD-10-CM

## 2012-08-12 DIAGNOSIS — Z01818 Encounter for other preprocedural examination: Secondary | ICD-10-CM | POA: Insufficient documentation

## 2012-08-12 HISTORY — PX: PARTIAL MASTECTOMY WITH NEEDLE LOCALIZATION: SHX6008

## 2012-08-12 LAB — GLUCOSE, CAPILLARY
Glucose-Capillary: 120 mg/dL — ABNORMAL HIGH (ref 70–99)
Glucose-Capillary: 107 mg/dL — ABNORMAL HIGH (ref 70–99)

## 2012-08-12 SURGERY — PARTIAL MASTECTOMY WITH NEEDLE LOCALIZATION
Anesthesia: General | Site: Breast | Laterality: Right | Wound class: Clean

## 2012-08-12 MED ORDER — PROPOFOL 10 MG/ML IV BOLUS
INTRAVENOUS | Status: DC | PRN
  Administered 2012-08-12: 200 mg via INTRAVENOUS

## 2012-08-12 MED ORDER — BUPIVACAINE-EPINEPHRINE PF 0.25-1:200000 % IJ SOLN
INTRAMUSCULAR | Status: AC
Start: 2012-08-12 — End: 2012-08-12
  Filled 2012-08-12: qty 30

## 2012-08-12 MED ORDER — OXYCODONE HCL 5 MG PO TABS
ORAL_TABLET | ORAL | Status: AC
  Filled 2012-08-12: qty 1

## 2012-08-12 MED ORDER — LIDOCAINE HCL (CARDIAC) 20 MG/ML IV SOLN
INTRAVENOUS | Status: DC | PRN
  Administered 2012-08-12: 100 mg via INTRAVENOUS

## 2012-08-12 MED ORDER — BUPIVACAINE-EPINEPHRINE 0.25% -1:200000 IJ SOLN
INTRAMUSCULAR | Status: DC | PRN
  Administered 2012-08-12: 30 mL

## 2012-08-12 MED ORDER — OXYCODONE HCL 5 MG PO TABS
5.0000 mg | ORAL_TABLET | Freq: Once | ORAL | Status: AC | PRN
  Administered 2012-08-12: 5 mg via ORAL

## 2012-08-12 MED ORDER — DEXTROSE 5 % IV SOLN: 3.0000 g | INTRAVENOUS | Status: DC

## 2012-08-12 MED ORDER — OXYCODONE HCL 5 MG/5ML PO SOLN: 5.0000 mg | Freq: Once | ORAL | Status: AC | PRN

## 2012-08-12 MED ORDER — ARTIFICIAL TEARS OP OINT
TOPICAL_OINTMENT | OPHTHALMIC | Status: DC | PRN
  Administered 2012-08-12: 1 via OPHTHALMIC

## 2012-08-12 MED ORDER — FENTANYL CITRATE 0.05 MG/ML IJ SOLN
INTRAMUSCULAR | Status: DC | PRN
  Administered 2012-08-12: 50 ug via INTRAVENOUS
  Administered 2012-08-12: 25 ug via INTRAVENOUS
  Administered 2012-08-12: 50 ug via INTRAVENOUS
  Administered 2012-08-12: 25 ug via INTRAVENOUS

## 2012-08-12 MED ORDER — ONDANSETRON HCL 4 MG/2ML IJ SOLN
INTRAMUSCULAR | Status: DC | PRN
  Administered 2012-08-12: 4 mg via INTRAVENOUS

## 2012-08-12 MED ORDER — METOCLOPRAMIDE HCL 5 MG/ML IJ SOLN
10.0000 mg | Freq: Once | INTRAMUSCULAR | Status: AC | PRN
  Administered 2012-08-12: 10 mg via INTRAVENOUS

## 2012-08-12 MED ORDER — MIDAZOLAM HCL 5 MG/5ML IJ SOLN
INTRAMUSCULAR | Status: DC | PRN
  Administered 2012-08-12 (×2): 2 mg via INTRAVENOUS

## 2012-08-12 MED ORDER — 0.9 % SODIUM CHLORIDE (POUR BTL) OPTIME
TOPICAL | Status: DC | PRN
  Administered 2012-08-12: 1000 mL

## 2012-08-12 MED ORDER — HYDROMORPHONE HCL PF 1 MG/ML IJ SOLN
0.2500 mg | INTRAMUSCULAR | Status: DC | PRN
  Administered 2012-08-12 (×2): 0.5 mg via INTRAVENOUS

## 2012-08-12 MED ORDER — OXYCODONE-ACETAMINOPHEN 5-325 MG PO TABS: 1.0000 | ORAL_TABLET | ORAL | Status: DC | PRN

## 2012-08-12 MED ORDER — HYDROMORPHONE HCL PF 1 MG/ML IJ SOLN
INTRAMUSCULAR | Status: AC
  Filled 2012-08-12: qty 1

## 2012-08-12 MED ORDER — LACTATED RINGERS IV SOLN
INTRAVENOUS | Status: DC | PRN
  Administered 2012-08-12: 15:00:00 via INTRAVENOUS

## 2012-08-12 MED ORDER — PHENYLEPHRINE HCL 10 MG/ML IJ SOLN
INTRAMUSCULAR | Status: DC | PRN
  Administered 2012-08-12: 40 ug via INTRAVENOUS
  Administered 2012-08-12: 20 ug via INTRAVENOUS

## 2012-08-12 MED ORDER — DEXAMETHASONE SODIUM PHOSPHATE 4 MG/ML IJ SOLN
INTRAMUSCULAR | Status: DC | PRN
  Administered 2012-08-12: 8 mg via INTRAVENOUS

## 2012-08-12 SURGICAL SUPPLY — 48 items
APPLIER CLIP 9.375 MED OPEN (MISCELLANEOUS)
BINDER BREAST LRG (GAUZE/BANDAGES/DRESSINGS) IMPLANT
BINDER BREAST XLRG (GAUZE/BANDAGES/DRESSINGS) IMPLANT
BLADE SURG 10 STRL SS (BLADE) ×2 IMPLANT
BLADE SURG 15 STRL LF DISP TIS (BLADE) ×1 IMPLANT
BLADE SURG 15 STRL SS (BLADE) ×1
CANISTER SUCTION 2500CC (MISCELLANEOUS) IMPLANT
CHLORAPREP W/TINT 26ML (MISCELLANEOUS) ×2 IMPLANT
CLIP APPLIE 9.375 MED OPEN (MISCELLANEOUS) IMPLANT
CLOTH BEACON ORANGE TIMEOUT ST (SAFETY) ×2 IMPLANT
COVER SURGICAL LIGHT HANDLE (MISCELLANEOUS) ×2 IMPLANT
DERMABOND ADVANCED (GAUZE/BANDAGES/DRESSINGS) ×1
DERMABOND ADVANCED .7 DNX12 (GAUZE/BANDAGES/DRESSINGS) ×1 IMPLANT
DEVICE DUBIN SPECIMEN MAMMOGRA (MISCELLANEOUS) ×2 IMPLANT
DRAPE CHEST BREAST 15X10 FENES (DRAPES) ×2 IMPLANT
DRAPE UTILITY 15X26 W/TAPE STR (DRAPE) ×4 IMPLANT
ELECT CAUTERY BLADE 6.4 (BLADE) ×2 IMPLANT
ELECT REM PT RETURN 9FT ADLT (ELECTROSURGICAL) ×2
ELECTRODE REM PT RTRN 9FT ADLT (ELECTROSURGICAL) ×1 IMPLANT
GLOVE BIO SURGEON STRL SZ7 (GLOVE) ×2 IMPLANT
GLOVE BIO SURGEON STRL SZ7.5 (GLOVE) ×2 IMPLANT
GLOVE BIOGEL PI IND STRL 7.5 (GLOVE) ×1 IMPLANT
GLOVE BIOGEL PI IND STRL 8 (GLOVE) ×1 IMPLANT
GLOVE BIOGEL PI INDICATOR 7.5 (GLOVE) ×1
GLOVE BIOGEL PI INDICATOR 8 (GLOVE) ×1
GOWN STRL NON-REIN LRG LVL3 (GOWN DISPOSABLE) ×4 IMPLANT
KIT BASIN OR (CUSTOM PROCEDURE TRAY) ×2 IMPLANT
KIT MARKER MARGIN INK (KITS) IMPLANT
KIT ROOM TURNOVER OR (KITS) ×2 IMPLANT
NEEDLE 22X1 1/2 (OR ONLY) (NEEDLE) ×2 IMPLANT
NS IRRIG 1000ML POUR BTL (IV SOLUTION) ×2 IMPLANT
PACK SURGICAL SETUP 50X90 (CUSTOM PROCEDURE TRAY) ×2 IMPLANT
PAD ARMBOARD 7.5X6 YLW CONV (MISCELLANEOUS) ×2 IMPLANT
PENCIL BUTTON HOLSTER BLD 10FT (ELECTRODE) ×2 IMPLANT
SPONGE LAP 18X18 X RAY DECT (DISPOSABLE) ×2 IMPLANT
STAPLER VISISTAT 35W (STAPLE) ×2 IMPLANT
SUT MNCRL AB 4-0 PS2 18 (SUTURE) IMPLANT
SUT SILK 2 0 SH (SUTURE) IMPLANT
SUT VIC AB 2-0 SH 27 (SUTURE) ×1
SUT VIC AB 2-0 SH 27XBRD (SUTURE) ×1 IMPLANT
SUT VIC AB 3-0 SH 27 (SUTURE) ×1
SUT VIC AB 3-0 SH 27X BRD (SUTURE) ×1 IMPLANT
SYR BULB 3OZ (MISCELLANEOUS) ×2 IMPLANT
SYR CONTROL 10ML LL (SYRINGE) ×4 IMPLANT
TOWEL OR 17X24 6PK STRL BLUE (TOWEL DISPOSABLE) ×2 IMPLANT
TOWEL OR 17X26 10 PK STRL BLUE (TOWEL DISPOSABLE) ×2 IMPLANT
TUBE CONNECTING 12X1/4 (SUCTIONS) IMPLANT
YANKAUER SUCT BULB TIP NO VENT (SUCTIONS) IMPLANT

## 2012-08-12 NOTE — Anesthesia Preprocedure Evaluation (Signed)
Anesthesia Evaluation  Patient identified by MRN, date of birth, ID band Patient awake    Reviewed: Allergy & Precautions, H&P , NPO status , Patient's Chart, lab work & pertinent test results, reviewed documented beta blocker date and time   Airway Mallampati: II TM Distance: >3 FB Neck ROM: full    Dental   Pulmonary neg pulmonary ROS,  breath sounds clear to auscultation        Cardiovascular hypertension, On Medications Rhythm:regular     Neuro/Psych  Headaches, negative psych ROS   GI/Hepatic Neg liver ROS, GERD-  Medicated and Controlled,  Endo/Other  diabetes, Oral Hypoglycemic AgentsMorbid obesity  Renal/GU negative Renal ROS  negative genitourinary   Musculoskeletal   Abdominal   Peds  Hematology negative hematology ROS (+)   Anesthesia Other Findings See surgeon's H&P   Reproductive/Obstetrics negative OB ROS                           Anesthesia Physical Anesthesia Plan  ASA: III  Anesthesia Plan: General   Post-op Pain Management:    Induction: Intravenous  Airway Management Planned: LMA  Additional Equipment:   Intra-op Plan:   Post-operative Plan: Extubation in OR  Informed Consent: I have reviewed the patients History and Physical, chart, labs and discussed the procedure including the risks, benefits and alternatives for the proposed anesthesia with the patient or authorized representative who has indicated his/her understanding and acceptance.   Dental Advisory Given  Plan Discussed with: CRNA and Surgeon  Anesthesia Plan Comments:         Anesthesia Quick Evaluation

## 2012-08-12 NOTE — H&P (View-Only) (Signed)
Patient ID: Brittany Rubio, female   DOB: 15-Aug-1960, 52 y.o.   MRN: [id number]  Chief Complaint  Patient presents with  . Pre-op Exam    eval R br    HPI Brittany Rubio is a 52 y.o. female.  Patient sent at the request of Dr. Jean Rosenthal due to right breast nipple discharge which is bloody for 2 months. 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 Jean Rosenthal in the right breast. Denies any history of breast mass, and his chronic breast pain from previous breast reduction surgery. She had a previous left breast papilloma excised in 2009. HPI  Past Medical History  Diagnosis Date  . Hypertension 11/26/2011  . Diabetes mellitus 11/26/2011  . Migraines 11/26/2011  . Cancer     Breast    Past Surgical History  Procedure Date  . Breast surgery 1995    Breast Reduction  . Breast surgery 2009    Lumpectomy  . Ruptured dia   . Diaphragm surgery 1986  . Abdominal hysterectomy ? 1997    Family History  Problem Relation Age of Onset  . Cancer Mother     Pancreatic    Social History History  Substance Use Topics  . Smoking status: Never Smoker   . Smokeless tobacco: Never Used  . Alcohol Use: Yes     Comment: social    No Known Allergies  Current Outpatient Prescriptions  Medication Sig Dispense Refill  . butorphanol (STADOL) 10 MG/ML nasal spray Place 1 spray into the nose every 4 (four) hours as needed.        . diltiazem (MATZIM LA) 240 MG 24 hr tablet Take 240 mg by mouth daily.      Marland Kitchen levocetirizine (XYZAL) 5 MG tablet Take 5 mg by mouth every evening.      Marland Kitchen lisinopril-hydrochlorothiazide (PRINZIDE,ZESTORETIC) 20-12.5 MG per tablet Take 1 tablet by mouth daily.        . promethazine (PHENERGAN) 25 MG tablet Take 25 mg by mouth every 6 (six) hours as needed.        . Saxagliptin-Metformin (KOMBIGLYZE XR) 2.11-998 MG TB24 Take 2 tablets by mouth daily.       . sitaGLIPtin (JANUVIA) 25 MG tablet Take 25 mg  by mouth.        Review of Systems Review of Systems  Constitutional: Negative for fever, chills and unexpected weight change.  HENT: Negative for hearing loss, congestion, sore throat, trouble swallowing and voice change.   Eyes: Negative for visual disturbance.  Respiratory: Negative for cough and wheezing.   Cardiovascular: Negative for chest pain, palpitations and leg swelling.  Gastrointestinal: Negative for nausea, vomiting, abdominal pain, diarrhea, constipation, blood in stool, abdominal distention and anal bleeding.  Genitourinary: Negative for hematuria, vaginal bleeding and difficulty urinating.  Musculoskeletal: Negative for arthralgias.  Skin: Negative for rash and wound.  Neurological: Negative for seizures, syncope and headaches.  Hematological: Negative for adenopathy. Does not bruise/bleed easily.  Psychiatric/Behavioral: Negative for confusion.    Blood pressure 140/88, pulse 80, temperature 97.3 F (36.3 C), temperature source Temporal, resp. rate 18, height 5\' 5"  (1.651 m), weight 250 lb 9.6 oz (113.671 kg).  Physical Exam Physical Exam  Constitutional: She is oriented to person, place, and time. She appears well-developed and well-nourished.  HENT:  Head: Normocephalic and atraumatic.  Eyes: EOM are normal. Pupils are equal, round, and reactive to light.  Neck: Normal range of motion. Neck supple.  Cardiovascular: Normal rate and regular rhythm.   Pulmonary/Chest: Right breast exhibits nipple discharge. Right breast exhibits no inverted nipple, no mass, no skin change and no tenderness. Left breast exhibits no inverted nipple, no mass, no nipple discharge and no tenderness.    Musculoskeletal: Normal range of motion.  Neurological: She is alert and oriented to person, place, and time.  Skin: Skin is warm and dry.  Psychiatric: She has a normal mood and affect. Her behavior is normal. Thought content normal.    Data Reviewed The pathology associated withthe  right breast ultrasound guided  core biopsy demonstrated an intraductal papilloma. There was no  evidence for malignancy. The pathology is concordant with imaging  findings. I have discussed the findings with the patient by  telephone and answered her questions. The patient states that her  biopsy site is clean and dry without hematoma formation. Post  biopsy wound care instructions were reviewed with the patient.  Surgical excision of the papilloma is recommended and the patient  has been scheduled to see Dr. Luisa Hart on 07/18/2012. The patient  was encouraged to call the [facility] for additional questions  or concerns.   Assessment    Right breast bloody nipple discharge and papilloma    Plan    Right breast partial mastectomy with needle localization The procedure has been discussed with the patient. Alternatives to surgery have been discussed with the patient.  Risks of surgery include bleeding,  Infection,  Seroma formation, death,  and the need for further surgery.   The patient understands and wishes to proceed.       Natausha Jungwirth A. 07/18/2012, 4:32 PM

## 2012-08-12 NOTE — Progress Notes (Signed)
Dr Frederick at bedside.

## 2012-08-12 NOTE — Interval H&P Note (Signed)
History and Physical Interval Note:  08/12/2012 2:03 PM  Brittany Rubio  has presented today for surgery, with the diagnosis of right breast papilloma  The various methods of treatment have been discussed with the patient and family. After consideration of risks, benefits and other options for treatment, the patient has consented to  Procedure(s) (LRB) with comments: PARTIAL MASTECTOMY WITH NEEDLE LOCALIZATION (Right) as a surgical intervention .  The patient's history has been reviewed, patient examined, no change in status, stable for surgery.  I have reviewed the patient's chart and labs.  Questions were answered to the patient's satisfaction.     Rogerick Baldwin A.

## 2012-08-12 NOTE — Op Note (Signed)
Preoperative diagnosis: right breast mass  Postop diagnosis: Same  Procedure: right  Partial mastectomy with wire localization  Surgeon: Harriette Bouillon M.D.  Anesthesia: LMA with 0.25% Sensorcaine local  EBL: Less than 40 cc  Specimen: Breast mass with wire and clip verified by radiography to pathology  Drains: None  Indications for procedure: The patient presents with a breast mass. Core biopsy showed a mass with papilloma.  Risk of malignancy is 10 %. The patient was to proceed with partial mastectomy with wire localization.  Description of procedure: The patient was seen in the holding area and the appropriate side was marked. Questions are answered. Wire localization was done the radiology. The patient was taken back to the operating room and placed supine on the operating room table. After induction of general anesthesia, chest and upper arm  were prepped and draped in a sterile fashion. Timeout was done and she received preoperative antibiotics. Curvilinear incision was made around the wire insertion site at the level of the nipple. . All tissue around the wire was excised and hemostasis was achieved with cautery.  Nipple was viable.  The area was removed in its entirety upon gross examination. Gross margin negative. Radiograph revealed the mass, wire and clip to be in the specimen. The wound was closed in layers using 3-0 Vicryl and 4-0 Monocryl subcuticular stitch. Dermabond applied. All final counts found to be correct. Patient awoke extubated taken recovery in satisfactory condition.

## 2012-08-12 NOTE — Progress Notes (Signed)
Report received from Elise RN

## 2012-08-12 NOTE — Preoperative (Signed)
Beta Blockers   Reason not to administer Beta Blockers:Not Applicable 

## 2012-08-12 NOTE — Transfer of Care (Signed)
Immediate Anesthesia Transfer of Care Note  Patient: Brittany Rubio  Procedure(s) Performed: Procedure(s) (LRB) with comments: PARTIAL MASTECTOMY WITH NEEDLE LOCALIZATION (Right)  Patient Location: PACU  Anesthesia Type:General  Level of Consciousness: awake, alert  and oriented  Airway & Oxygen Therapy: Patient Spontanous Breathing and Patient connected to nasal cannula oxygen  Post-op Assessment: Report given to PACU RN, Post -op Vital signs reviewed and stable and Patient moving all extremities  Post vital signs: Reviewed and stable  Complications: No apparent anesthesia complications

## 2012-08-12 NOTE — Anesthesia Postprocedure Evaluation (Signed)
Anesthesia Post Note  Patient: Brittany Rubio  Procedure(s) Performed: Procedure(s) (LRB): PARTIAL MASTECTOMY WITH NEEDLE LOCALIZATION (Right)  Anesthesia type: general  Patient location: PACU  Post pain: Pain level controlled  Post assessment: Patient's Cardiovascular Status Stable  Last Vitals:  Filed Vitals:   08/12/12 1730  BP: 146/77  Pulse: 93  Temp:   Resp: 19    Post vital signs: Reviewed and stable  Level of consciousness: sedated  Complications: No apparent anesthesia complications

## 2012-08-13 ENCOUNTER — Encounter (HOSPITAL_COMMUNITY): Payer: Self-pay | Admitting: Surgery

## 2012-08-15 ENCOUNTER — Telehealth (INDEPENDENT_AMBULATORY_CARE_PROVIDER_SITE_OTHER): Payer: Self-pay

## 2012-08-15 NOTE — Telephone Encounter (Signed)
The pt called in for the pathology results.  I gave them to her and let her know they were benign.

## 2012-09-01 ENCOUNTER — Ambulatory Visit (INDEPENDENT_AMBULATORY_CARE_PROVIDER_SITE_OTHER): Payer: BC Managed Care – PPO | Admitting: Surgery

## 2012-09-01 ENCOUNTER — Encounter (INDEPENDENT_AMBULATORY_CARE_PROVIDER_SITE_OTHER): Payer: Self-pay | Admitting: Surgery

## 2012-09-01 ENCOUNTER — Encounter (INDEPENDENT_AMBULATORY_CARE_PROVIDER_SITE_OTHER): Payer: BC Managed Care – PPO | Admitting: Surgery

## 2012-09-01 VITALS — BP 160/88 | HR 76 | Temp 98.0°F | Resp 20 | Ht 65.0 in | Wt 248.0 lb

## 2012-09-01 DIAGNOSIS — Z9889 Other specified postprocedural states: Secondary | ICD-10-CM

## 2012-09-01 NOTE — Progress Notes (Signed)
Patient returns after right breast lumpectomy. She's doing well. Final pathology shows papilloma.  Exam: Right breast incision clean dry and intact.  Impression: Status post right breast partial mastectomy for papilloma with negative margins  Plan: Resume routine screening mammography. Followup as needed

## 2012-09-01 NOTE — Patient Instructions (Signed)
Your diagnosis is papilloma  and this is benign. Resume routine screening mammogram.

## 2012-11-20 ENCOUNTER — Other Ambulatory Visit (HOSPITAL_BASED_OUTPATIENT_CLINIC_OR_DEPARTMENT_OTHER): Payer: BC Managed Care – PPO | Admitting: Lab

## 2012-11-20 DIAGNOSIS — C50419 Malignant neoplasm of upper-outer quadrant of unspecified female breast: Secondary | ICD-10-CM

## 2012-11-20 DIAGNOSIS — C50919 Malignant neoplasm of unspecified site of unspecified female breast: Secondary | ICD-10-CM

## 2012-11-20 DIAGNOSIS — Z853 Personal history of malignant neoplasm of breast: Secondary | ICD-10-CM

## 2012-11-20 LAB — CBC WITH DIFFERENTIAL/PLATELET
BASO%: 0.5 % (ref 0.0–2.0)
Basophils Absolute: 0.1 10*3/uL (ref 0.0–0.1)
EOS%: 1.6 % (ref 0.0–7.0)
Eosinophils Absolute: 0.2 10*3/uL (ref 0.0–0.5)
HCT: 40.1 % (ref 34.8–46.6)
HGB: 12.8 g/dL (ref 11.6–15.9)
LYMPH%: 18.1 % (ref 14.0–49.7)
MCH: 23.4 pg — ABNORMAL LOW (ref 25.1–34.0)
MCHC: 32 g/dL (ref 31.5–36.0)
MCV: 73.1 fL — ABNORMAL LOW (ref 79.5–101.0)
MONO#: 0.9 10*3/uL (ref 0.1–0.9)
MONO%: 8.8 % (ref 0.0–14.0)
NEUT#: 6.9 10*3/uL — ABNORMAL HIGH (ref 1.5–6.5)
NEUT%: 71 % (ref 38.4–76.8)
Platelets: 294 10*3/uL (ref 145–400)
RBC: 5.49 10*6/uL — ABNORMAL HIGH (ref 3.70–5.45)
RDW: 16.6 % — ABNORMAL HIGH (ref 11.2–14.5)
WBC: 9.7 10*3/uL (ref 3.9–10.3)
lymph#: 1.8 10*3/uL (ref 0.9–3.3)

## 2012-11-20 LAB — COMPREHENSIVE METABOLIC PANEL (CC13)
ALT: 42 U/L (ref 0–55)
AST: 28 U/L (ref 5–34)
Albumin: 3.7 g/dL (ref 3.5–5.0)
Alkaline Phosphatase: 55 U/L (ref 40–150)
BUN: 17 mg/dL (ref 7.0–26.0)
CO2: 29 mEq/L (ref 22–29)
Calcium: 9.7 mg/dL (ref 8.4–10.4)
Chloride: 99 mEq/L (ref 98–107)
Creatinine: 1 mg/dL (ref 0.6–1.1)
Glucose: 142 mg/dl — ABNORMAL HIGH (ref 70–99)
Potassium: 4.1 mEq/L (ref 3.5–5.1)
Sodium: 141 mEq/L (ref 136–145)
Total Bilirubin: 0.2 mg/dL (ref 0.20–1.20)
Total Protein: 8 g/dL (ref 6.4–8.3)

## 2012-11-27 ENCOUNTER — Ambulatory Visit (HOSPITAL_BASED_OUTPATIENT_CLINIC_OR_DEPARTMENT_OTHER): Payer: BC Managed Care – PPO | Admitting: Oncology

## 2012-11-27 VITALS — BP 138/61 | HR 96 | Temp 98.6°F | Resp 20 | Ht 65.0 in | Wt 238.2 lb

## 2012-11-27 DIAGNOSIS — C50912 Malignant neoplasm of unspecified site of left female breast: Secondary | ICD-10-CM

## 2012-11-27 DIAGNOSIS — C50919 Malignant neoplasm of unspecified site of unspecified female breast: Secondary | ICD-10-CM
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
## 2012-11-28 ENCOUNTER — Telehealth: Payer: Self-pay | Admitting: Oncology

## 2012-11-28 NOTE — Telephone Encounter (Signed)
, °

## 2013-02-16 ENCOUNTER — Other Ambulatory Visit: Payer: Self-pay | Admitting: Internal Medicine

## 2013-02-16 DIAGNOSIS — E049 Nontoxic goiter, unspecified: Secondary | ICD-10-CM

## 2013-02-27 ENCOUNTER — Ambulatory Visit
Admission: RE | Admit: 2013-02-27 | Discharge: 2013-02-27 | Disposition: A | Discharge status: Home / Self Care | Payer: BC Managed Care – PPO | Source: Ambulatory Visit | Attending: Internal Medicine | Admitting: Internal Medicine

## 2013-02-27 DIAGNOSIS — E049 Nontoxic goiter, unspecified: Secondary | ICD-10-CM

## 2013-05-27 ENCOUNTER — Telehealth: Payer: Self-pay | Admitting: *Deleted

## 2013-05-27 NOTE — Telephone Encounter (Signed)
Pt called stating that she will be having a dental procedure on 11/20. gv appt for 11/18 @ 8:45am...td

## 2013-05-28 ENCOUNTER — Encounter (INDEPENDENT_AMBULATORY_CARE_PROVIDER_SITE_OTHER): Payer: Self-pay

## 2013-05-28 ENCOUNTER — Other Ambulatory Visit (HOSPITAL_BASED_OUTPATIENT_CLINIC_OR_DEPARTMENT_OTHER): Payer: BC Managed Care – PPO

## 2013-05-28 DIAGNOSIS — C50912 Malignant neoplasm of unspecified site of left female breast: Secondary | ICD-10-CM

## 2013-05-28 DIAGNOSIS — C50419 Malignant neoplasm of upper-outer quadrant of unspecified female breast: Secondary | ICD-10-CM

## 2013-05-28 LAB — CBC WITH DIFFERENTIAL/PLATELET
BASO%: 1 % (ref 0.0–2.0)
Basophils Absolute: 0.1 10*3/uL (ref 0.0–0.1)
EOS%: 2.1 % (ref 0.0–7.0)
Eosinophils Absolute: 0.2 10*3/uL (ref 0.0–0.5)
HCT: 40.8 % (ref 34.8–46.6)
HGB: 12.9 g/dL (ref 11.6–15.9)
LYMPH%: 17.8 % (ref 14.0–49.7)
MCH: 22.9 pg — ABNORMAL LOW (ref 25.1–34.0)
MCHC: 31.7 g/dL (ref 31.5–36.0)
MCV: 72.1 fL — ABNORMAL LOW (ref 79.5–101.0)
MONO#: 0.8 10*3/uL (ref 0.1–0.9)
MONO%: 8.7 % (ref 0.0–14.0)
NEUT#: 6.7 10*3/uL — ABNORMAL HIGH (ref 1.5–6.5)
NEUT%: 70.4 % (ref 38.4–76.8)
Platelets: 317 10*3/uL (ref 145–400)
RBC: 5.66 10*6/uL — ABNORMAL HIGH (ref 3.70–5.45)
RDW: 17.6 % — ABNORMAL HIGH (ref 11.2–14.5)
WBC: 9.5 10*3/uL (ref 3.9–10.3)
lymph#: 1.7 10*3/uL (ref 0.9–3.3)

## 2013-05-28 LAB — COMPREHENSIVE METABOLIC PANEL (CC13)
ALT: 23 U/L (ref 0–55)
AST: 15 U/L (ref 5–34)
Albumin: 3.7 g/dL (ref 3.5–5.0)
Alkaline Phosphatase: 58 U/L (ref 40–150)
Anion Gap: 11 mEq/L (ref 3–11)
BUN: 16.9 mg/dL (ref 7.0–26.0)
CO2: 28 mEq/L (ref 22–29)
Calcium: 9.8 mg/dL (ref 8.4–10.4)
Chloride: 104 mEq/L (ref 98–109)
Creatinine: 0.8 mg/dL (ref 0.6–1.1)
Glucose: 94 mg/dl (ref 70–140)
Potassium: 4.4 mEq/L (ref 3.5–5.1)
Sodium: 143 mEq/L (ref 136–145)
Total Bilirubin: 0.28 mg/dL (ref 0.20–1.20)
Total Protein: 7.9 g/dL (ref 6.4–8.3)

## 2013-06-02 ENCOUNTER — Ambulatory Visit (HOSPITAL_BASED_OUTPATIENT_CLINIC_OR_DEPARTMENT_OTHER): Payer: BC Managed Care – PPO | Admitting: Physician Assistant

## 2013-06-02 ENCOUNTER — Encounter: Payer: Self-pay | Admitting: Physician Assistant

## 2013-06-02 VITALS — BP 139/78 | HR 87 | Temp 98.3°F | Resp 18 | Ht 65.0 in | Wt 234.0 lb

## 2013-06-02 DIAGNOSIS — D249 Benign neoplasm of unspecified breast: Secondary | ICD-10-CM | POA: Insufficient documentation

## 2013-06-02 DIAGNOSIS — Z853 Personal history of malignant neoplasm of breast: Secondary | ICD-10-CM

## 2013-06-02 DIAGNOSIS — D241 Benign neoplasm of right breast: Secondary | ICD-10-CM

## 2013-06-02 DIAGNOSIS — C50912 Malignant neoplasm of unspecified site of left female breast: Secondary | ICD-10-CM
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
## 2013-06-04 ENCOUNTER — Ambulatory Visit: Payer: BC Managed Care – PPO | Admitting: Physician Assistant

## 2013-06-18 ENCOUNTER — Other Ambulatory Visit: Payer: Self-pay | Admitting: Internal Medicine

## 2013-06-18 DIAGNOSIS — Z853 Personal history of malignant neoplasm of breast: Secondary | ICD-10-CM

## 2013-07-06 ENCOUNTER — Ambulatory Visit
Admission: RE | Admit: 2013-07-06 | Discharge: 2013-07-06 | Disposition: A | Discharge status: Home / Self Care | Payer: BC Managed Care – PPO | Source: Ambulatory Visit | Attending: Internal Medicine | Admitting: Internal Medicine

## 2013-07-06 DIAGNOSIS — Z853 Personal history of malignant neoplasm of breast: Secondary | ICD-10-CM

## 2013-07-16 DIAGNOSIS — E042 Nontoxic multinodular goiter: Secondary | ICD-10-CM

## 2013-07-16 HISTORY — DX: Nontoxic multinodular goiter: E04.2

## 2014-05-15 ENCOUNTER — Encounter (HOSPITAL_COMMUNITY): Payer: Self-pay | Admitting: Emergency Medicine

## 2014-05-15 ENCOUNTER — Emergency Department (HOSPITAL_COMMUNITY): Payer: BC Managed Care – PPO

## 2014-05-15 ENCOUNTER — Emergency Department (HOSPITAL_COMMUNITY)
Admission: EM | Admit: 2014-05-15 | Discharge: 2014-05-15 | Disposition: A | Discharge status: Home / Self Care | Payer: BC Managed Care – PPO | Attending: Emergency Medicine | Admitting: Emergency Medicine

## 2014-05-15 DIAGNOSIS — Z853 Personal history of malignant neoplasm of breast: Secondary | ICD-10-CM | POA: Diagnosis not present

## 2014-05-15 DIAGNOSIS — M545 Low back pain, unspecified: Secondary | ICD-10-CM

## 2014-05-15 DIAGNOSIS — M1990 Unspecified osteoarthritis, unspecified site: Secondary | ICD-10-CM | POA: Diagnosis not present

## 2014-05-15 DIAGNOSIS — K219 Gastro-esophageal reflux disease without esophagitis: Secondary | ICD-10-CM | POA: Insufficient documentation

## 2014-05-15 DIAGNOSIS — E041 Nontoxic single thyroid nodule: Secondary | ICD-10-CM | POA: Diagnosis not present

## 2014-05-15 DIAGNOSIS — E119 Type 2 diabetes mellitus without complications: Secondary | ICD-10-CM | POA: Insufficient documentation

## 2014-05-15 DIAGNOSIS — Z79899 Other long term (current) drug therapy: Secondary | ICD-10-CM | POA: Insufficient documentation

## 2014-05-15 DIAGNOSIS — S161XXA Strain of muscle, fascia and tendon at neck level, initial encounter: Secondary | ICD-10-CM | POA: Diagnosis not present

## 2014-05-15 DIAGNOSIS — S0990XA Unspecified injury of head, initial encounter: Secondary | ICD-10-CM | POA: Insufficient documentation

## 2014-05-15 DIAGNOSIS — Y9389 Activity, other specified: Secondary | ICD-10-CM | POA: Diagnosis not present

## 2014-05-15 DIAGNOSIS — I10 Essential (primary) hypertension: Secondary | ICD-10-CM | POA: Diagnosis not present

## 2014-05-15 DIAGNOSIS — Y92410 Unspecified street and highway as the place of occurrence of the external cause: Secondary | ICD-10-CM | POA: Insufficient documentation

## 2014-05-15 DIAGNOSIS — S3992XA Unspecified injury of lower back, initial encounter: Secondary | ICD-10-CM | POA: Diagnosis present

## 2014-05-15 DIAGNOSIS — M542 Cervicalgia: Secondary | ICD-10-CM

## 2014-05-15 MED ORDER — METHOCARBAMOL 500 MG PO TABS: 500.0000 mg | ORAL_TABLET | Freq: Two times a day (BID) | ORAL | Status: DC

## 2014-05-15 MED ORDER — DIAZEPAM 5 MG PO TABS
10.0000 mg | ORAL_TABLET | Freq: Once | ORAL | Status: AC
  Administered 2014-05-15: 10 mg via ORAL
  Filled 2014-05-15: qty 2

## 2014-05-15 MED ORDER — HYDROCODONE-ACETAMINOPHEN 5-325 MG PO TABS: 1.0000 | ORAL_TABLET | Freq: Four times a day (QID) | ORAL | Status: DC | PRN

## 2014-05-15 MED ORDER — METHOCARBAMOL 500 MG PO TABS: 500.0000 mg | ORAL_TABLET | Freq: Once | ORAL | Status: DC

## 2014-05-15 MED ORDER — OXYCODONE-ACETAMINOPHEN 5-325 MG PO TABS
2.0000 | ORAL_TABLET | Freq: Once | ORAL | Status: AC
  Administered 2014-05-15: 2 via ORAL
  Filled 2014-05-15: qty 2

## 2014-05-15 MED ORDER — IBUPROFEN 400 MG PO TABS
800.0000 mg | ORAL_TABLET | Freq: Once | ORAL | Status: AC
  Administered 2014-05-15: 800 mg via ORAL
  Filled 2014-05-15: qty 2

## 2014-05-15 MED ORDER — IBUPROFEN 800 MG PO TABS: 800.0000 mg | ORAL_TABLET | Freq: Three times a day (TID) | ORAL | Status: DC

## 2014-05-15 NOTE — Discharge Instructions (Signed)
1. Medications: robaxin, naproxyn, vicodin, usual home medications °2. Treatment: rest, drink plenty of fluids, gentle stretching as discussed, alternate ice and heat °3. Follow Up: Please followup with your primary doctor in 3 days for discussion of your diagnoses and further evaluation after today's visit; if you do not have a primary care doctor use the resource guide provided to find one;  Return to the ER for worsening back pain, difficulty walking, loss of bowel or bladder control or other concerning symptoms ° ° °Back Exercises °Back exercises help treat and prevent back injuries. The goal of back exercises is to increase the strength of your abdominal and back muscles and the flexibility of your back. These exercises should be started when you no longer have back pain. Back exercises include: °· Pelvic Tilt. Lie on your back with your knees bent. Tilt your pelvis until the lower part of your back is against the floor. Hold this position 5 to 10 sec and repeat 5 to 10 times. °· Knee to Chest. Pull first 1 knee up against your chest and hold for 20 to 30 seconds, repeat this with the other knee, and then both knees. This may be done with the other leg straight or bent, whichever feels better. °· Sit-Ups or Curl-Ups. Bend your knees 90 degrees. Start with tilting your pelvis, and do a partial, slow sit-up, lifting your trunk only 30 to 45 degrees off the floor. Take at least 2 to 3 seconds for each sit-up. Do not do sit-ups with your knees out straight. If partial sit-ups are difficult, simply do the above but with only tightening your abdominal muscles and holding it as directed. °· Hip-Lift. Lie on your back with your knees flexed 90 degrees. Push down with your feet and shoulders as you raise your hips a couple inches off the floor; hold for 10 seconds, repeat 5 to 10 times. °· Back arches. Lie on your stomach, propping yourself up on bent elbows. Slowly press on your hands, causing an arch in your low back.  Repeat 3 to 5 times. Any initial stiffness and discomfort should lessen with repetition over time. °· Shoulder-Lifts. Lie face down with arms beside your body. Keep hips and torso pressed to floor as you slowly lift your head and shoulders off the floor. °Do not overdo your exercises, especially in the beginning. Exercises may cause you some mild back discomfort which lasts for a few minutes; however, if the pain is more severe, or lasts for more than 15 minutes, do not continue exercises until you see your caregiver. Improvement with exercise therapy for back problems is slow.  °See your caregivers for assistance with developing a proper back exercise program. °Document Released: 08/09/2004 Document Revised: 09/24/2011 Document Reviewed: 05/03/2011 °ExitCare® Patient Information ©2015 [employer]. This information is not intended to replace advice given to you by your health care provider. Make sure you discuss any questions you have with your health care provider. ° ° °

## 2014-05-15 NOTE — ED Provider Notes (Signed)
Medical screening examination/treatment/procedure(s) were performed by non-physician practitioner and as supervising physician I was immediately available for consultation/collaboration.   EKG Interpretation None        Delice Bison Ward, DO 05/15/14 2351
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
## 2014-06-16 ENCOUNTER — Other Ambulatory Visit: Payer: Self-pay | Admitting: Otolaryngology

## 2014-06-28 ENCOUNTER — Other Ambulatory Visit: Payer: Self-pay | Admitting: Internal Medicine

## 2014-06-28 DIAGNOSIS — Z853 Personal history of malignant neoplasm of breast: Secondary | ICD-10-CM

## 2014-06-28 NOTE — Pre-Procedure Instructions (Signed)
Brittany Rubio  06/28/2014   Your procedure is scheduled on:  Wednesday, December 23rd  Report to [facility] Admitting at 8 AM.  Call this number if you have problems the morning of surgery: [phone]   Remember:   Do not eat food or drink liquids after midnight.   Take these medicines the morning of surgery with A SIP OF WATER: nexium, diltiazem, hydrocodone if needed   Do not wear jewelry, make-up or nail polish.  Do not wear lotions, powders, or perfumes, deodorant.  Do not shave 48 hours prior to surgery. Men may shave face and neck.  Do not bring valuables to the hospital.  [facility] is not responsible for any belongings or valuables.               Contacts, dentures or bridgework may not be worn into surgery.  Leave suitcase in the car. After surgery it may be brought to your room.  For patients admitted to the hospital, discharge time is determined by your   treatment team.          Please read over the following fact sheets that you were given: Pain Booklet, Coughing and Deep Breathing and Surgical Site Infection Prevention  [place] - Preparing for Surgery  Before surgery, you can play an important role.  Because skin is not sterile, your skin needs to be as free of germs as possible.  You can reduce the number of germs on you skin by washing with CHG (chlorahexidine gluconate) soap before surgery.  CHG is an antiseptic cleaner which kills germs and bonds with the skin to continue killing germs even after washing.  Please DO NOT use if you have an allergy to CHG or antibacterial soaps.  If your skin becomes reddened/irritated stop using the CHG and inform your nurse when you arrive at Short Stay.  Do not shave (including legs and underarms) for at least 48 hours prior to the first CHG shower.  You may shave your face.  Please follow these instructions carefully:   1.  Shower with CHG Soap the night before surgery and the morning of Surgery.  2.  If  you choose to wash your hair, wash your hair first as usual with your normal shampoo.  3.  After you shampoo, rinse your hair and body thoroughly to remove the shampoo.  4.  Use CHG as you would any other liquid soap.  You can apply CHG directly to the skin and wash gently with scrungie or a clean washcloth.  5.  Apply the CHG Soap to your body ONLY FROM THE NECK DOWN.  Do not use on open wounds or open sores.  Avoid contact with your eyes, ears, mouth and genitals (private parts).  Wash genitals (private parts) with your normal soap.  6.  Wash thoroughly, paying special attention to the area where your surgery will be performed.  7.  Thoroughly rinse your body with warm water from the neck down.  8.  DO NOT shower/wash with your normal soap after using and rinsing off the CHG Soap.  9.  Pat yourself dry with a clean towel.            10.  Wear clean pajamas.            11.  Place clean sheets on your bed the night of your first shower and do not sleep with pets.  Day of Surgery  Do not apply any lotions/deoderants the  morning of surgery.  Please wear clean clothes to the hospital/surgery center.

## 2014-06-29 ENCOUNTER — Encounter (HOSPITAL_COMMUNITY): Payer: Self-pay

## 2014-06-29 ENCOUNTER — Encounter (HOSPITAL_COMMUNITY)
Admission: RE | Admit: 2014-06-29 | Discharge: 2014-06-29 | Disposition: A | Discharge status: Home / Self Care | Payer: BC Managed Care – PPO | Source: Ambulatory Visit | Attending: Otolaryngology | Admitting: Otolaryngology

## 2014-06-29 ENCOUNTER — Ambulatory Visit (HOSPITAL_COMMUNITY)
Admission: RE | Admit: 2014-06-29 | Discharge: 2014-06-29 | Disposition: A | Discharge status: Home / Self Care | Payer: BC Managed Care – PPO | Source: Ambulatory Visit | Attending: Anesthesiology | Admitting: Anesthesiology

## 2014-06-29 DIAGNOSIS — Z01818 Encounter for other preprocedural examination: Secondary | ICD-10-CM

## 2014-06-29 LAB — CBC
HCT: 39.1 % (ref 36.0–46.0)
Hemoglobin: 12.4 g/dL (ref 12.0–15.0)
MCH: 22.9 pg — ABNORMAL LOW (ref 26.0–34.0)
MCHC: 31.7 g/dL (ref 30.0–36.0)
MCV: 72.3 fL — ABNORMAL LOW (ref 78.0–100.0)
Platelets: 281 10*3/uL (ref 150–400)
RBC: 5.41 MIL/uL — ABNORMAL HIGH (ref 3.87–5.11)
RDW: 16.8 % — ABNORMAL HIGH (ref 11.5–15.5)
WBC: 7.8 10*3/uL (ref 4.0–10.5)

## 2014-06-29 LAB — BASIC METABOLIC PANEL
Anion gap: 14 (ref 5–15)
BUN: 13 mg/dL (ref 6–23)
CO2: 25 mEq/L (ref 19–32)
Calcium: 9.3 mg/dL (ref 8.4–10.5)
Chloride: 101 mEq/L (ref 96–112)
Creatinine, Ser: 0.75 mg/dL (ref 0.50–1.10)
GFR calc Af Amer: >90 mL/min (ref >90)
GFR calc non Af Amer: >90 mL/min (ref >90)
Glucose, Bld: 129 mg/dL — ABNORMAL HIGH (ref 70–99)
Potassium: 4.3 mEq/L (ref 3.7–5.3)
Sodium: 140 mEq/L (ref 137–147)

## 2014-06-29 NOTE — Progress Notes (Signed)
[facility]   PCP R. Baird Cancer is aware.  She does have CPAP, but doesn't know the settings.

## 2014-07-07 ENCOUNTER — Encounter (HOSPITAL_COMMUNITY): Payer: Self-pay | Admitting: *Deleted

## 2014-07-07 ENCOUNTER — Ambulatory Visit (HOSPITAL_COMMUNITY): Payer: BC Managed Care – PPO | Admitting: Anesthesiology

## 2014-07-07 ENCOUNTER — Encounter (HOSPITAL_COMMUNITY)
Admission: RE | Disposition: A | Discharge status: Home / Self Care | Payer: Self-pay | Source: Ambulatory Visit | Attending: Otolaryngology

## 2014-07-07 ENCOUNTER — Ambulatory Visit (HOSPITAL_COMMUNITY)
Admission: RE | Admit: 2014-07-07 | Discharge: 2014-07-10 | Disposition: A | Discharge status: Home / Self Care | Payer: BC Managed Care – PPO | Source: Ambulatory Visit | Attending: Otolaryngology | Admitting: Otolaryngology

## 2014-07-07 DIAGNOSIS — M1990 Unspecified osteoarthritis, unspecified site: Secondary | ICD-10-CM | POA: Insufficient documentation

## 2014-07-07 DIAGNOSIS — E042 Nontoxic multinodular goiter: Secondary | ICD-10-CM | POA: Insufficient documentation

## 2014-07-07 DIAGNOSIS — Z90710 Acquired absence of both cervix and uterus: Secondary | ICD-10-CM | POA: Insufficient documentation

## 2014-07-07 DIAGNOSIS — Z9889 Other specified postprocedural states: Secondary | ICD-10-CM

## 2014-07-07 DIAGNOSIS — Z833 Family history of diabetes mellitus: Secondary | ICD-10-CM | POA: Insufficient documentation

## 2014-07-07 DIAGNOSIS — I10 Essential (primary) hypertension: Secondary | ICD-10-CM | POA: Insufficient documentation

## 2014-07-07 DIAGNOSIS — E890 Postprocedural hypothyroidism: Secondary | ICD-10-CM

## 2014-07-07 DIAGNOSIS — C73 Malignant neoplasm of thyroid gland: Secondary | ICD-10-CM | POA: Insufficient documentation

## 2014-07-07 DIAGNOSIS — Z853 Personal history of malignant neoplasm of breast: Secondary | ICD-10-CM | POA: Insufficient documentation

## 2014-07-07 HISTORY — DX: Pure hypercholesterolemia, unspecified: E78.00

## 2014-07-07 HISTORY — DX: Dependence on other enabling machines and devices: Z99.89

## 2014-07-07 HISTORY — PX: THYROIDECTOMY, PARTIAL: SHX18

## 2014-07-07 HISTORY — DX: Nontoxic multinodular goiter: E04.2

## 2014-07-07 HISTORY — PX: THYROIDECTOMY: SHX17

## 2014-07-07 HISTORY — DX: Obstructive sleep apnea (adult) (pediatric): G47.33

## 2014-07-07 HISTORY — DX: Type 2 diabetes mellitus without complications: E11.9

## 2014-07-07 LAB — GLUCOSE, CAPILLARY
Glucose-Capillary: 138 mg/dL — ABNORMAL HIGH (ref 70–99)
Glucose-Capillary: 203 mg/dL — ABNORMAL HIGH (ref 70–99)

## 2014-07-07 SURGERY — THYROIDECTOMY
Anesthesia: General | Site: Neck | Laterality: Left

## 2014-07-07 MED ORDER — LACTATED RINGERS IV SOLN
INTRAVENOUS | Status: DC
  Administered 2014-07-07: 09:00:00 via INTRAVENOUS

## 2014-07-07 MED ORDER — PROPOFOL 10 MG/ML IV BOLUS
INTRAVENOUS | Status: AC
  Filled 2014-07-07: qty 20

## 2014-07-07 MED ORDER — PROMETHAZINE HCL 25 MG RE SUPP: 25.0000 mg | Freq: Four times a day (QID) | RECTAL | Status: DC | PRN

## 2014-07-07 MED ORDER — PRAVASTATIN SODIUM 80 MG PO TABS
80.0000 mg | ORAL_TABLET | Freq: Every day | ORAL | Status: DC
  Administered 2014-07-08 – 2014-07-09 (×2): 80 mg via ORAL
  Filled 2014-07-07 (×4): qty 1

## 2014-07-07 MED ORDER — SUCCINYLCHOLINE CHLORIDE 20 MG/ML IJ SOLN
INTRAMUSCULAR | Status: DC | PRN
  Administered 2014-07-07: 120 mg via INTRAVENOUS

## 2014-07-07 MED ORDER — BUTORPHANOL TARTRATE 10 MG/ML NA SOLN
1.0000 | NASAL | Status: DC | PRN
  Administered 2014-07-10: 1 via NASAL

## 2014-07-07 MED ORDER — SAXAGLIPTIN-METFORMIN ER 2.5-1000 MG PO TB24: 2.0000 | ORAL_TABLET | Freq: Every day | ORAL | Status: DC

## 2014-07-07 MED ORDER — 0.9 % SODIUM CHLORIDE (POUR BTL) OPTIME
TOPICAL | Status: DC | PRN
  Administered 2014-07-07: 1000 mL

## 2014-07-07 MED ORDER — PNEUMOCOCCAL VAC POLYVALENT 25 MCG/0.5ML IJ INJ: 0.5000 mL | INJECTION | INTRAMUSCULAR | Status: DC

## 2014-07-07 MED ORDER — PROMETHAZINE HCL 25 MG PO TABS
25.0000 mg | ORAL_TABLET | Freq: Four times a day (QID) | ORAL | Status: DC | PRN
  Administered 2014-07-09 – 2014-07-10 (×2): 25 mg via ORAL
  Filled 2014-07-07 (×2): qty 1

## 2014-07-07 MED ORDER — FENTANYL CITRATE 0.05 MG/ML IJ SOLN
INTRAMUSCULAR | Status: AC
  Filled 2014-07-07: qty 5

## 2014-07-07 MED ORDER — HYDROMORPHONE HCL 1 MG/ML IJ SOLN
0.2500 mg | INTRAMUSCULAR | Status: DC | PRN
  Administered 2014-07-07 (×2): 0.5 mg via INTRAVENOUS

## 2014-07-07 MED ORDER — LACTATED RINGERS IV SOLN
INTRAVENOUS | Status: DC | PRN
  Administered 2014-07-07 (×2): via INTRAVENOUS

## 2014-07-07 MED ORDER — LIDOCAINE-EPINEPHRINE 1 %-1:100000 IJ SOLN
INTRAMUSCULAR | Status: DC | PRN
  Administered 2014-07-07: 20 mL

## 2014-07-07 MED ORDER — HYDROMORPHONE HCL 1 MG/ML IJ SOLN
INTRAMUSCULAR | Status: AC
Start: 2014-07-07 — End: 2014-07-07
  Administered 2014-07-07: 0.5 mg via INTRAVENOUS
  Filled 2014-07-07: qty 1

## 2014-07-07 MED ORDER — ONDANSETRON HCL 4 MG/2ML IJ SOLN
INTRAMUSCULAR | Status: DC | PRN
  Administered 2014-07-07: 4 mg via INTRAVENOUS

## 2014-07-07 MED ORDER — PHENYLEPHRINE 40 MCG/ML (10ML) SYRINGE FOR IV PUSH (FOR BLOOD PRESSURE SUPPORT)
PREFILLED_SYRINGE | INTRAVENOUS | Status: AC
  Filled 2014-07-07: qty 20

## 2014-07-07 MED ORDER — OXYCODONE-ACETAMINOPHEN 5-325 MG PO TABS: 1.0000 | ORAL_TABLET | ORAL | Status: DC | PRN

## 2014-07-07 MED ORDER — ONDANSETRON HCL 4 MG/2ML IJ SOLN
INTRAMUSCULAR | Status: AC
  Filled 2014-07-07: qty 2

## 2014-07-07 MED ORDER — MIDAZOLAM HCL 2 MG/2ML IJ SOLN
INTRAMUSCULAR | Status: AC
  Filled 2014-07-07: qty 2

## 2014-07-07 MED ORDER — DEXAMETHASONE SODIUM PHOSPHATE 4 MG/ML IJ SOLN
INTRAMUSCULAR | Status: AC
  Filled 2014-07-07: qty 1

## 2014-07-07 MED ORDER — LEVOCETIRIZINE DIHYDROCHLORIDE 5 MG PO TABS: 5.0000 mg | ORAL_TABLET | Freq: Every evening | ORAL | Status: DC

## 2014-07-07 MED ORDER — PHENYLEPHRINE HCL 10 MG/ML IJ SOLN
INTRAMUSCULAR | Status: DC | PRN
  Administered 2014-07-07: 120 ug via INTRAVENOUS
  Administered 2014-07-07 (×2): 140 ug via INTRAVENOUS

## 2014-07-07 MED ORDER — LIDOCAINE HCL (CARDIAC) 20 MG/ML IV SOLN
INTRAVENOUS | Status: DC | PRN
  Administered 2014-07-07: 100 mg via INTRAVENOUS

## 2014-07-07 MED ORDER — AMPHETAMINE-DEXTROAMPHETAMINE 10 MG PO TABS
20.0000 mg | ORAL_TABLET | Freq: Every day | ORAL | Status: DC
  Filled 2014-07-07 (×3): qty 2

## 2014-07-07 MED ORDER — POTASSIUM CHLORIDE IN NACL 20-0.45 MEQ/L-% IV SOLN
INTRAVENOUS | Status: DC
  Administered 2014-07-07 – 2014-07-10 (×6): via INTRAVENOUS
  Filled 2014-07-07 (×8): qty 1000

## 2014-07-07 MED ORDER — EPHEDRINE SULFATE 50 MG/ML IJ SOLN
INTRAMUSCULAR | Status: AC
  Filled 2014-07-07: qty 1

## 2014-07-07 MED ORDER — PROPOFOL 10 MG/ML IV BOLUS
INTRAVENOUS | Status: DC | PRN
  Administered 2014-07-07: 200 mg via INTRAVENOUS
  Administered 2014-07-07: 50 mg via INTRAVENOUS

## 2014-07-07 MED ORDER — EPHEDRINE SULFATE 50 MG/ML IJ SOLN
INTRAMUSCULAR | Status: DC | PRN
  Administered 2014-07-07 (×2): 15 mg via INTRAVENOUS

## 2014-07-07 MED ORDER — CEFAZOLIN SODIUM-DEXTROSE 2-3 GM-% IV SOLR
INTRAVENOUS | Status: DC | PRN
  Administered 2014-07-07: 2 g via INTRAVENOUS

## 2014-07-07 MED ORDER — LORATADINE 10 MG PO TABS
10.0000 mg | ORAL_TABLET | Freq: Every day | ORAL | Status: DC
  Administered 2014-07-09: 10 mg via ORAL
  Filled 2014-07-07 (×4): qty 1

## 2014-07-07 MED ORDER — METFORMIN HCL ER 500 MG PO TB24
2000.0000 mg | ORAL_TABLET | Freq: Every day | ORAL | Status: DC
  Administered 2014-07-09 – 2014-07-10 (×2): 2000 mg via ORAL
  Filled 2014-07-07 (×4): qty 1

## 2014-07-07 MED ORDER — DILTIAZEM HCL ER COATED BEADS 240 MG PO TB24
240.0000 mg | ORAL_TABLET | Freq: Every day | ORAL | Status: DC
  Administered 2014-07-09 – 2014-07-10 (×2): 240 mg via ORAL
  Filled 2014-07-07 (×4): qty 1

## 2014-07-07 MED ORDER — OXYCODONE-ACETAMINOPHEN 5-325 MG PO TABS
1.0000 | ORAL_TABLET | ORAL | Status: DC | PRN
  Administered 2014-07-07 – 2014-07-10 (×9): 2 via ORAL
  Filled 2014-07-07 (×9): qty 2

## 2014-07-07 MED ORDER — TRAZODONE HCL 50 MG PO TABS
50.0000 mg | ORAL_TABLET | Freq: Every day | ORAL | Status: DC
  Administered 2014-07-07 – 2014-07-09 (×3): 50 mg via ORAL
  Filled 2014-07-07 (×5): qty 1

## 2014-07-07 MED ORDER — MORPHINE SULFATE 2 MG/ML IJ SOLN
2.0000 mg | INTRAMUSCULAR | Status: DC | PRN
  Administered 2014-07-07: 4 mg via INTRAVENOUS
  Administered 2014-07-07: 2 mg via INTRAVENOUS
  Administered 2014-07-08 (×3): 4 mg via INTRAVENOUS
  Administered 2014-07-09: 2 mg via INTRAVENOUS
  Administered 2014-07-09: 4 mg via INTRAVENOUS
  Administered 2014-07-09 – 2014-07-10 (×2): 2 mg via INTRAVENOUS
  Filled 2014-07-07: qty 2
  Filled 2014-07-07 (×2): qty 1
  Filled 2014-07-07 (×3): qty 2
  Filled 2014-07-07: qty 1
  Filled 2014-07-07: qty 2
  Filled 2014-07-07: qty 1

## 2014-07-07 MED ORDER — LISINOPRIL-HYDROCHLOROTHIAZIDE 20-12.5 MG PO TABS: 1.0000 | ORAL_TABLET | Freq: Every day | ORAL | Status: DC

## 2014-07-07 MED ORDER — DEXAMETHASONE SODIUM PHOSPHATE 4 MG/ML IJ SOLN
INTRAMUSCULAR | Status: DC | PRN
  Administered 2014-07-07: 4 mg via INTRAVENOUS

## 2014-07-07 MED ORDER — LINAGLIPTIN 5 MG PO TABS
5.0000 mg | ORAL_TABLET | Freq: Every day | ORAL | Status: DC
  Administered 2014-07-09 – 2014-07-10 (×2): 5 mg via ORAL
  Filled 2014-07-07 (×3): qty 1

## 2014-07-07 MED ORDER — PANTOPRAZOLE SODIUM 40 MG PO TBEC
40.0000 mg | DELAYED_RELEASE_TABLET | Freq: Every day | ORAL | Status: DC
Start: 2014-07-07 — End: 2014-07-10
  Administered 2014-07-09 – 2014-07-10 (×2): 40 mg via ORAL
  Filled 2014-07-07 (×2): qty 1

## 2014-07-07 MED ORDER — SODIUM CHLORIDE 0.9 % IJ SOLN
INTRAMUSCULAR | Status: AC
  Filled 2014-07-07: qty 10

## 2014-07-07 MED ORDER — LISINOPRIL 20 MG PO TABS
20.0000 mg | ORAL_TABLET | Freq: Every day | ORAL | Status: DC
  Administered 2014-07-09 – 2014-07-10 (×2): 20 mg via ORAL
  Filled 2014-07-07 (×3): qty 1

## 2014-07-07 MED ORDER — DAPAGLIFLOZIN PROPANEDIOL 10 MG PO TABS: 10.0000 mg | ORAL_TABLET | Freq: Every day | ORAL | Status: DC

## 2014-07-07 MED ORDER — PHENYLEPHRINE HCL 10 MG/ML IJ SOLN
10.0000 mg | INTRAVENOUS | Status: DC | PRN
  Administered 2014-07-07: 40 ug/min via INTRAVENOUS

## 2014-07-07 MED ORDER — CEFAZOLIN SODIUM-DEXTROSE 2-3 GM-% IV SOLR
INTRAVENOUS | Status: AC
  Filled 2014-07-07: qty 50

## 2014-07-07 MED ORDER — LIDOCAINE-EPINEPHRINE 1 %-1:100000 IJ SOLN
INTRAMUSCULAR | Status: AC
  Filled 2014-07-07: qty 1

## 2014-07-07 MED ORDER — HYDROCHLOROTHIAZIDE 12.5 MG PO CAPS
12.5000 mg | ORAL_CAPSULE | Freq: Every day | ORAL | Status: DC
  Administered 2014-07-09 – 2014-07-10 (×2): 12.5 mg via ORAL
  Filled 2014-07-07 (×3): qty 1

## 2014-07-07 MED ORDER — MIDAZOLAM HCL 5 MG/5ML IJ SOLN
INTRAMUSCULAR | Status: DC | PRN
  Administered 2014-07-07: 2 mg via INTRAVENOUS

## 2014-07-07 MED ORDER — FENTANYL CITRATE 0.05 MG/ML IJ SOLN
INTRAMUSCULAR | Status: DC | PRN
  Administered 2014-07-07: 100 ug via INTRAVENOUS
  Administered 2014-07-07 (×2): 50 ug via INTRAVENOUS
  Administered 2014-07-07: 100 ug via INTRAVENOUS

## 2014-07-07 SURGICAL SUPPLY — 57 items
ATTRACTOMAT 16X20 MAGNETIC DRP (DRAPES) ×2 IMPLANT
BENZOIN TINCTURE PRP APPL 2/3 (GAUZE/BANDAGES/DRESSINGS) ×2 IMPLANT
BLADE 10 SAFETY STRL DISP (BLADE) ×2 IMPLANT
BLADE SURG 10 STRL SS (BLADE) ×2 IMPLANT
BLADE SURG 15 STRL LF DISP TIS (BLADE) ×1 IMPLANT
BLADE SURG 15 STRL SS (BLADE) ×1
CANISTER SUCTION 2500CC (MISCELLANEOUS) ×2 IMPLANT
CLEANER TIP ELECTROSURG 2X2 (MISCELLANEOUS) ×2 IMPLANT
CLIP TI WIDE RED SMALL 24 (CLIP) IMPLANT
CONT SPEC 4OZ CLIKSEAL STRL BL (MISCELLANEOUS) ×2 IMPLANT
CORDS BIPOLAR (ELECTRODE) ×4 IMPLANT
COVER SURGICAL LIGHT HANDLE (MISCELLANEOUS) ×2 IMPLANT
CRADLE DONUT ADULT HEAD (MISCELLANEOUS) ×2 IMPLANT
DECANTER SPIKE VIAL GLASS SM (MISCELLANEOUS) ×2 IMPLANT
DERMABOND ADVANCED (GAUZE/BANDAGES/DRESSINGS)
DERMABOND ADVANCED .7 DNX12 (GAUZE/BANDAGES/DRESSINGS) IMPLANT
DRAIN CHANNEL 10F 3/8 F FF (DRAIN) ×2 IMPLANT
ELECT COATED BLADE 2.86 ST (ELECTRODE) ×2 IMPLANT
ELECT REM PT RETURN 9FT ADLT (ELECTROSURGICAL) ×2
ELECTRODE REM PT RTRN 9FT ADLT (ELECTROSURGICAL) ×1 IMPLANT
EVACUATOR SILICONE 100CC (DRAIN) ×2 IMPLANT
GAUZE SPONGE 4X4 16PLY XRAY LF (GAUZE/BANDAGES/DRESSINGS) ×10 IMPLANT
GLOVE BIO SURGEON STRL SZ 6.5 (GLOVE) ×2 IMPLANT
GLOVE BIOGEL PI IND STRL 6.5 (GLOVE) ×1 IMPLANT
GLOVE BIOGEL PI IND STRL 7.0 (GLOVE) ×2 IMPLANT
GLOVE BIOGEL PI INDICATOR 6.5 (GLOVE) ×1
GLOVE BIOGEL PI INDICATOR 7.0 (GLOVE) ×2
GLOVE ECLIPSE 7.5 STRL STRAW (GLOVE) ×2 IMPLANT
GLOVE SURG SS PI 7.0 STRL IVOR (GLOVE) ×2 IMPLANT
GOWN STRL REUS W/ TWL LRG LVL3 (GOWN DISPOSABLE) ×3 IMPLANT
GOWN STRL REUS W/TWL LRG LVL3 (GOWN DISPOSABLE) ×3
HEMOSTAT SURGICEL 2X14 (HEMOSTASIS) IMPLANT
KIT BASIN OR (CUSTOM PROCEDURE TRAY) ×2 IMPLANT
KIT ROOM TURNOVER OR (KITS) ×2 IMPLANT
LIQUID BAND (GAUZE/BANDAGES/DRESSINGS) ×2 IMPLANT
LOCATOR NERVE 3 VOLT (DISPOSABLE) ×2 IMPLANT
NEEDLE HYPO 25GX1X1/2 BEV (NEEDLE) ×2 IMPLANT
NS IRRIG 1000ML POUR BTL (IV SOLUTION) ×2 IMPLANT
PAD ARMBOARD 7.5X6 YLW CONV (MISCELLANEOUS) ×2 IMPLANT
PENCIL BUTTON HOLSTER BLD 10FT (ELECTRODE) ×2 IMPLANT
PROBE NERVBE PRASS .33 (MISCELLANEOUS) ×2 IMPLANT
SHEARS HARMONIC 9CM CVD (BLADE) ×4 IMPLANT
SPONGE INTESTINAL PEANUT (DISPOSABLE) ×2 IMPLANT
SUT ETHILON 2 0 FS 18 (SUTURE) ×2 IMPLANT
SUT PROLENE 6 0 CC 1 (SUTURE) IMPLANT
SUT SILK 2 0 FS (SUTURE) ×4 IMPLANT
SUT SILK 3 0 REEL (SUTURE) ×2 IMPLANT
SUT VICRYL 4-0 PS2 18IN ABS (SUTURE) ×4 IMPLANT
TAPE CLOTH 4X10 WHT NS (GAUZE/BANDAGES/DRESSINGS) ×2 IMPLANT
TOWEL OR 17X26 10 PK STRL BLUE (TOWEL DISPOSABLE) ×2 IMPLANT
TRAY ENT MC OR (CUSTOM PROCEDURE TRAY) ×2 IMPLANT
TRAY FOLEY CATH 14FRSI W/METER (CATHETERS) IMPLANT
TUBE ENDOTRAC EMG 7X10.2 (MISCELLANEOUS) ×2 IMPLANT
TUBE ENDOTRAC EMG 8X11.3 (MISCELLANEOUS) IMPLANT
TUBE ENDOTRACH  EMG 6MMTUBE EN (MISCELLANEOUS)
TUBE ENDOTRACH EMG 6MMTUBE EN (MISCELLANEOUS) IMPLANT
YANKAUER SUCT BULB TIP NO VENT (SUCTIONS) ×2 IMPLANT

## 2014-07-07 NOTE — Anesthesia Preprocedure Evaluation (Addendum)
Anesthesia Evaluation  Patient identified by MRN, date of birth, ID band Patient awake    Reviewed: Allergy & Precautions, H&P , NPO status , Patient's Chart, lab work & pertinent test results  Airway Mallampati: II  TM Distance: >3 FB Neck ROM: Full    Dental  (+) Teeth Intact, Dental Advisory Given   Pulmonary sleep apnea ,  breath sounds clear to auscultation        Cardiovascular hypertension, Rhythm:Regular Rate:Normal     Neuro/Psych    GI/Hepatic Neg liver ROS, GERD-  ,  Endo/Other  diabetes  Renal/GU negative Renal ROS     Musculoskeletal   Abdominal   Peds  Hematology   Anesthesia Other Findings   Reproductive/Obstetrics                           Anesthesia Physical Anesthesia Plan  ASA: III  Anesthesia Plan: General   Post-op Pain Management:    Induction: Intravenous  Airway Management Planned: Oral ETT  Additional Equipment:   Intra-op Plan:   Post-operative Plan: Possible Post-op intubation/ventilation  Informed Consent: I have reviewed the patients History and Physical, chart, labs and discussed the procedure including the risks, benefits and alternatives for the proposed anesthesia with the patient or authorized representative who has indicated his/her understanding and acceptance.     Plan Discussed with: CRNA and Anesthesiologist  Anesthesia Plan Comments:         Anesthesia Quick Evaluation

## 2014-07-07 NOTE — Transfer of Care (Signed)
Immediate Anesthesia Transfer of Care Note  Patient: Brittany Rubio  Procedure(s) Performed: Procedure(s): LEFT HEMI-THYROIDECTOMY (Left)  Patient Location: PACU  Anesthesia Type:General  Level of Consciousness: awake, alert  and patient cooperative  Airway & Oxygen Therapy: Patient Spontanous Breathing and Patient connected to nasal cannula oxygen  Post-op Assessment: Report given to PACU RN, Post -op Vital signs reviewed and stable and Patient moving all extremities  Post vital signs: Reviewed and stable  Complications: No apparent anesthesia complications
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
## 2014-07-07 NOTE — Op Note (Signed)
Brittany Rubio, PERINE NO.:  [id number]  MEDICAL RECORD NO.:  [id number]  LOCATION:  6N12C                        FACILITY:  [facility]  PHYSICIAN:  Leta Baptist, MD            DATE OF BIRTH:  04/08/1961  DATE OF PROCEDURE:  07/07/2014 DATE OF DISCHARGE:                              OPERATIVE REPORT   SURGEON:  Leta Baptist, MD.  ASSISTANT:  Rometta Emery, P.A.  PREOPERATIVE DIAGNOSIS:  Left thyroid mass.  POSTOPERATIVE DIAGNOSIS:  Left thyroid mass.  PROCEDURE PERFORMED:  Left hemithyroidectomy.  ANESTHESIA:  General endotracheal tube anesthesia.  COMPLICATIONS:  None.  ESTIMATED BLOOD LOSS:  Less than 200 mL.  INDICATION FOR PROCEDURE:  The patient is a 53 year old female who was recently noted on a neck CT scan to have a large left thyroid mass.  Her thyroid was noted to be diffusely enlarged.  However, discrete 3-cm nodule was noted on the left side.  The size of the thyroid mass was noted to cause mass effect on her airway, causing deviation of her trachea to the right.  Based on the above findings, the decision was made for the patient to undergo the hemithyroidectomy surgery.  The risks, benefits, alternatives, and details of the procedure were discussed with the patient.  Questions were invited and answered. Informed consent was obtained.  DESCRIPTION OF PROCEDURE:  The patient was taken to the operating room and placed supine on the operating table.  General endotracheal tube anesthesia was administered by the anesthesiologist.  Preop IV antibiotic was given.  The patient was positioned and prepped and draped in a standard fashion for thyroid surgery.  A 1% lidocaine with 1:100,000 epinephrine was infiltrated at the planned site of incision.  A transverse lower neck incision was made.  The incision was carried past the level of the platysma muscles.  Superior and inferiorly based subplatysmal flaps were elevated in a standard fashion.  The strap  muscles were divided at midline and retracted laterally, exposing the thyroid gland.  The patient was noted to have large left thyroid lobe.  Multiple nodules were noted.  The largest nodule measured more than 3-cm in diameter.  The entire left thyroid lobe was then carefully dissected free from the surrounding soft tissue. The superior and inferior thyroid vessels were ligated.  The left recurrent laryngeal nerve was identified and preserved.  The nerve was noted to be functional throughout the case.  The entire left thyroid lobe was then removed and sent as frozen section specimen to the pathologist.  The frozen section analysis was consistent with follicular lesion.  Several nodules were also noted within the isthmus.  The isthmus was also removed and sent to the Pathology Department.  The surgical site was copiously irrigated.  The strap muscles were reattached with 4-0 Vicryl sutures.  A #10 JP drain was placed.  The incision was closed in layers with 4-0 Vicryl and Dermabond.  The care of the patient was turned over to the anesthesiologist.  The patient was awakened from anesthesia without difficulty.  She was extubated and transferred to the recovery room in good condition.  OPERATIVE FINDINGS:  A large multinodular left thyroid  lobe was noted. The largest nodule was more than 3-cm in diameter.  The entire left thyroid lobe and the isthmus were resected and sent to the Pathology Department.  SPECIMEN:  Left thyroid lobe and isthmus.  FOLLOWUP CARE:  The patient will be observed overnight in the hospital. She will most likely be discharged home on postop day #1.  She will be placed on Percocet p.r.n. pain and amoxicillin p.o. b.i.d. for 5 days. The patient will follow up in my office in approximately 1 week.     Leta Baptist, MD     ST/MEDQ  D:  07/07/2014  T:  07/07/2014  Job:  [id number]

## 2014-07-07 NOTE — Brief Op Note (Signed)
07/07/2014  12:13 PM  PATIENT:  Brittany Rubio  53 y.o. female  PRE-OPERATIVE DIAGNOSIS:  LEFT THYROID MASS  POST-OPERATIVE DIAGNOSIS:  LEFT THYROID MASS  PROCEDURE:  Procedure(s): LEFT HEMI-THYROIDECTOMY (Left)  SURGEON:  Surgeon(s) and Role:    * Ascencion Dike, MD - Primary  PHYSICIAN ASSISTANT:   ASSISTANTS: Rometta Emery, PA-C   ANESTHESIA:   general  EBL:  Total I/O In: 1400 [I.V.:1400] Out: 51 [Blood:50]  BLOOD ADMINISTERED:none  DRAINS: (# 10) Jackson-Pratt drain(s) with closed bulb suction in the neck   LOCAL MEDICATIONS USED:  LIDOCAINE   SPECIMEN:  Source of Specimen:  Left thyroid lobe and isthmus  DISPOSITION OF SPECIMEN:  PATHOLOGY  COUNTS:  YES  TOURNIQUET:  * No tourniquets in log *  DICTATION: .Other Dictation: Dictation Number [id number]  PLAN OF CARE: Admit for overnight observation  PATIENT DISPOSITION:  PACU - hemodynamically stable.   Delay start of Pharmacological VTE agent (>24hrs) due to surgical blood loss or risk of bleeding: not applicable

## 2014-07-07 NOTE — Anesthesia Procedure Notes (Signed)
Procedure Name: Intubation Date/Time: 07/07/2014 10:09 AM Performed by: Trixie Deis A Pre-anesthesia Checklist: Patient identified, Timeout performed, Emergency Drugs available, Suction available and Patient being monitored Patient Re-evaluated:Patient Re-evaluated prior to inductionOxygen Delivery Method: Circle system utilized Preoxygenation: Pre-oxygenation with 100% oxygen Intubation Type: IV induction Ventilation: Mask ventilation without difficulty and Oral airway inserted - appropriate to patient size Grade View: Grade I Tube type: NIMS. Tube size: 7.0 mm Number of attempts: 1 Airway Equipment and Method: Rigid stylet and Video-laryngoscopy Placement Confirmation: ETT inserted through vocal cords under direct vision,  breath sounds checked- equal and bilateral and positive ETCO2 Secured at: 21 cm Tube secured with: Tape Dental Injury: Teeth and Oropharynx as per pre-operative assessment  Difficulty Due To: Difficult Airway- due to large tongue Comments: Large body habitus.

## 2014-07-07 NOTE — Anesthesia Postprocedure Evaluation (Signed)
  Anesthesia Post-op Note  Patient: Brittany Rubio  Procedure(s) Performed: Procedure(s): LEFT HEMI-THYROIDECTOMY (Left)  Patient Location: PACU  Anesthesia Type:General  Level of Consciousness: awake  Airway and Oxygen Therapy: Patient Spontanous Breathing  Post-op Pain: mild  Post-op Assessment: Post-op Vital signs reviewed  Post-op Vital Signs: Reviewed  Last Vitals:  Filed Vitals:   07/07/14 1324  BP: 158/87  Pulse: 103  Temp:   Resp: 29    Complications: No apparent anesthesia complications

## 2014-07-07 NOTE — OR Nursing (Signed)
Fenton in histology called to let Dr Benjamine Mola that specimen was recieved

## 2014-07-07 NOTE — OR Nursing (Signed)
Specimen left the room at 1143 on its way to histology. Histology called [phone] spoke w/ Noreene Larsson to let her know thyroid frozen specimen was being sent down.

## 2014-07-08 ENCOUNTER — Encounter (HOSPITAL_COMMUNITY): Payer: Self-pay | Admitting: Otolaryngology

## 2014-07-08 ENCOUNTER — Observation Stay (HOSPITAL_COMMUNITY): Payer: BC Managed Care – PPO | Admitting: Anesthesiology

## 2014-07-08 ENCOUNTER — Encounter (HOSPITAL_COMMUNITY)
Admission: RE | Disposition: A | Discharge status: Home / Self Care | Payer: Self-pay | Source: Ambulatory Visit | Attending: Otolaryngology

## 2014-07-08 HISTORY — PX: THYROIDECTOMY: SHX17

## 2014-07-08 LAB — GLUCOSE, CAPILLARY
Glucose-Capillary: 134 mg/dL — ABNORMAL HIGH (ref 70–99)
Glucose-Capillary: 186 mg/dL — ABNORMAL HIGH (ref 70–99)

## 2014-07-08 LAB — SURGICAL PCR SCREEN
MRSA, PCR: NEGATIVE
Staphylococcus aureus: NEGATIVE

## 2014-07-08 LAB — CALCIUM
Calcium: 8.1 mg/dL — ABNORMAL LOW (ref 8.4–10.5)
Calcium: 9 mg/dL (ref 8.4–10.5)

## 2014-07-08 SURGERY — THYROIDECTOMY
Anesthesia: General | Site: Neck | Laterality: Right

## 2014-07-08 MED ORDER — LIDOCAINE HCL (CARDIAC) 20 MG/ML IV SOLN
INTRAVENOUS | Status: DC | PRN
  Administered 2014-07-08: 80 mg via INTRAVENOUS

## 2014-07-08 MED ORDER — ACETAMINOPHEN 160 MG/5ML PO SOLN
325.0000 mg | ORAL | Status: DC | PRN
  Filled 2014-07-08: qty 20.3

## 2014-07-08 MED ORDER — 0.9 % SODIUM CHLORIDE (POUR BTL) OPTIME
TOPICAL | Status: DC | PRN
  Administered 2014-07-08: 1000 mL

## 2014-07-08 MED ORDER — MIDAZOLAM HCL 5 MG/5ML IJ SOLN
INTRAMUSCULAR | Status: DC | PRN
  Administered 2014-07-08: 2 mg via INTRAVENOUS

## 2014-07-08 MED ORDER — OXYCODONE HCL 5 MG PO TABS: 5.0000 mg | ORAL_TABLET | Freq: Once | ORAL | Status: DC | PRN

## 2014-07-08 MED ORDER — OXYCODONE HCL 5 MG/5ML PO SOLN: 5.0000 mg | Freq: Once | ORAL | Status: DC | PRN

## 2014-07-08 MED ORDER — LIDOCAINE-EPINEPHRINE 1 %-1:100000 IJ SOLN
INTRAMUSCULAR | Status: AC
  Filled 2014-07-08: qty 1

## 2014-07-08 MED ORDER — FENTANYL CITRATE 0.05 MG/ML IJ SOLN
INTRAMUSCULAR | Status: DC | PRN
  Administered 2014-07-08: 50 ug via INTRAVENOUS
  Administered 2014-07-08: 100 ug via INTRAVENOUS
  Administered 2014-07-08 (×2): 50 ug via INTRAVENOUS

## 2014-07-08 MED ORDER — ACETAMINOPHEN 325 MG PO TABS: 325.0000 mg | ORAL_TABLET | ORAL | Status: DC | PRN

## 2014-07-08 MED ORDER — LACTATED RINGERS IV SOLN
INTRAVENOUS | Status: DC
  Administered 2014-07-08: 10:00:00 via INTRAVENOUS

## 2014-07-08 MED ORDER — WHITE PETROLATUM GEL
Status: AC
  Administered 2014-07-08: 0.2
  Filled 2014-07-08: qty 5

## 2014-07-08 MED ORDER — NEOSTIGMINE METHYLSULFATE 10 MG/10ML IV SOLN
INTRAVENOUS | Status: AC
  Filled 2014-07-08: qty 1

## 2014-07-08 MED ORDER — ONDANSETRON HCL 4 MG/2ML IJ SOLN
INTRAMUSCULAR | Status: DC | PRN
  Administered 2014-07-08: 4 mg via INTRAVENOUS

## 2014-07-08 MED ORDER — PHENYLEPHRINE HCL 10 MG/ML IJ SOLN
INTRAMUSCULAR | Status: DC | PRN
  Administered 2014-07-08: 80 ug via INTRAVENOUS

## 2014-07-08 MED ORDER — ARTIFICIAL TEARS OP OINT
TOPICAL_OINTMENT | OPHTHALMIC | Status: AC
  Filled 2014-07-08: qty 3.5

## 2014-07-08 MED ORDER — GLYCOPYRROLATE 0.2 MG/ML IJ SOLN
INTRAMUSCULAR | Status: AC
  Filled 2014-07-08: qty 2

## 2014-07-08 MED ORDER — ONDANSETRON HCL 4 MG/2ML IJ SOLN
INTRAMUSCULAR | Status: AC
  Filled 2014-07-08: qty 2

## 2014-07-08 MED ORDER — LACTATED RINGERS IV SOLN
INTRAVENOUS | Status: DC | PRN
  Administered 2014-07-08 (×2): via INTRAVENOUS

## 2014-07-08 MED ORDER — HYDROMORPHONE HCL 1 MG/ML IJ SOLN
0.2500 mg | INTRAMUSCULAR | Status: DC | PRN
  Administered 2014-07-08 (×4): 0.5 mg via INTRAVENOUS

## 2014-07-08 MED ORDER — MIDAZOLAM HCL 2 MG/2ML IJ SOLN
INTRAMUSCULAR | Status: AC
  Filled 2014-07-08: qty 2

## 2014-07-08 MED ORDER — CALCIUM CARBONATE 1250 (500 CA) MG PO TABS
1.0000 | ORAL_TABLET | Freq: Two times a day (BID) | ORAL | Status: DC
Start: 2014-07-08 — End: 2014-07-10
  Administered 2014-07-08 – 2014-07-10 (×4): 500 mg via ORAL
  Filled 2014-07-08 (×6): qty 1

## 2014-07-08 MED ORDER — DEXTROSE 5 % IV SOLN
10.0000 mg | INTRAVENOUS | Status: DC | PRN
  Administered 2014-07-08: 40 ug/min via INTRAVENOUS

## 2014-07-08 MED ORDER — FENTANYL CITRATE 0.05 MG/ML IJ SOLN
INTRAMUSCULAR | Status: AC
  Filled 2014-07-08: qty 5

## 2014-07-08 MED ORDER — PROPOFOL 10 MG/ML IV BOLUS
INTRAVENOUS | Status: DC | PRN
  Administered 2014-07-08: 160 mg via INTRAVENOUS

## 2014-07-08 MED ORDER — SUCCINYLCHOLINE CHLORIDE 20 MG/ML IJ SOLN
INTRAMUSCULAR | Status: DC | PRN
  Administered 2014-07-08: 60 mg via INTRAVENOUS

## 2014-07-08 MED ORDER — ROCURONIUM BROMIDE 50 MG/5ML IV SOLN
INTRAVENOUS | Status: AC
  Filled 2014-07-08: qty 1

## 2014-07-08 MED ORDER — HYDROMORPHONE HCL 1 MG/ML IJ SOLN
INTRAMUSCULAR | Status: AC
  Filled 2014-07-08: qty 1

## 2014-07-08 MED ORDER — CEFAZOLIN SODIUM-DEXTROSE 2-3 GM-% IV SOLR
INTRAVENOUS | Status: DC | PRN
  Administered 2014-07-08: 2 g via INTRAVENOUS

## 2014-07-08 MED ORDER — PROPOFOL 10 MG/ML IV BOLUS
INTRAVENOUS | Status: AC
  Filled 2014-07-08: qty 20

## 2014-07-08 MED ORDER — LIDOCAINE-EPINEPHRINE 1 %-1:100000 IJ SOLN
INTRAMUSCULAR | Status: DC | PRN
  Administered 2014-07-08: 20 mL

## 2014-07-08 MED ORDER — DEXAMETHASONE SODIUM PHOSPHATE 4 MG/ML IJ SOLN
INTRAMUSCULAR | Status: DC | PRN
  Administered 2014-07-08: 8 mg via INTRAVENOUS

## 2014-07-08 SURGICAL SUPPLY — 58 items
ATTRACTOMAT 16X20 MAGNETIC DRP (DRAPES) IMPLANT
BENZOIN TINCTURE PRP APPL 2/3 (GAUZE/BANDAGES/DRESSINGS) ×2 IMPLANT
BLADE 10 SAFETY STRL DISP (BLADE) ×2 IMPLANT
BLADE SURG CLIPPER 3M 9600 (MISCELLANEOUS) IMPLANT
CANISTER SUCTION 2500CC (MISCELLANEOUS) ×2 IMPLANT
CLEANER TIP ELECTROSURG 2X2 (MISCELLANEOUS) ×2 IMPLANT
CLIP TI WIDE RED SMALL 24 (CLIP) IMPLANT
CONT SPEC 4OZ CLIKSEAL STRL BL (MISCELLANEOUS) ×4 IMPLANT
CORDS BIPOLAR (ELECTRODE) ×2 IMPLANT
COVER SURGICAL LIGHT HANDLE (MISCELLANEOUS) ×2 IMPLANT
CRADLE DONUT ADULT HEAD (MISCELLANEOUS) ×2 IMPLANT
DERMABOND ADVANCED (GAUZE/BANDAGES/DRESSINGS)
DERMABOND ADVANCED .7 DNX12 (GAUZE/BANDAGES/DRESSINGS) IMPLANT
DRAIN CHANNEL 10F 3/8 F FF (DRAIN) ×2 IMPLANT
DRAPE PROXIMA HALF (DRAPES) ×2 IMPLANT
ELECT COATED BLADE 2.86 ST (ELECTRODE) ×2 IMPLANT
ELECT REM PT RETURN 9FT ADLT (ELECTROSURGICAL) ×2
ELECTRODE REM PT RTRN 9FT ADLT (ELECTROSURGICAL) ×1 IMPLANT
EVACUATOR SILICONE 100CC (DRAIN) ×2 IMPLANT
GAUZE SPONGE 4X4 16PLY XRAY LF (GAUZE/BANDAGES/DRESSINGS) ×2 IMPLANT
GLOVE BIO SURGEON STRL SZ 6.5 (GLOVE) IMPLANT
GLOVE BIO SURGEON STRL SZ7 (GLOVE) ×6 IMPLANT
GLOVE BIO SURGEON STRL SZ7.5 (GLOVE) ×2 IMPLANT
GLOVE BIOGEL PI IND STRL 6.5 (GLOVE) ×1 IMPLANT
GLOVE BIOGEL PI IND STRL 7.0 (GLOVE) ×1 IMPLANT
GLOVE BIOGEL PI IND STRL 7.5 (GLOVE) ×1 IMPLANT
GLOVE BIOGEL PI INDICATOR 6.5 (GLOVE) ×1
GLOVE BIOGEL PI INDICATOR 7.0 (GLOVE) ×1
GLOVE BIOGEL PI INDICATOR 7.5 (GLOVE) ×1
GLOVE ECLIPSE 7.5 STRL STRAW (GLOVE) ×2 IMPLANT
GLOVE ORTHOPEDIC STR SZ6.5 (GLOVE) ×2 IMPLANT
GOWN STRL REUS W/ TWL LRG LVL3 (GOWN DISPOSABLE) ×3 IMPLANT
GOWN STRL REUS W/TWL LRG LVL3 (GOWN DISPOSABLE) ×3
HEMOSTAT SURGICEL 2X14 (HEMOSTASIS) IMPLANT
KIT BASIN OR (CUSTOM PROCEDURE TRAY) ×2 IMPLANT
KIT ROOM TURNOVER OR (KITS) ×2 IMPLANT
LIQUID BAND (GAUZE/BANDAGES/DRESSINGS) ×2 IMPLANT
LOCATOR NERVE 3 VOLT (DISPOSABLE) ×2 IMPLANT
NEEDLE HYPO 25GX1X1/2 BEV (NEEDLE) ×2 IMPLANT
NS IRRIG 1000ML POUR BTL (IV SOLUTION) ×2 IMPLANT
PAD ARMBOARD 7.5X6 YLW CONV (MISCELLANEOUS) ×2 IMPLANT
PENCIL BUTTON HOLSTER BLD 10FT (ELECTRODE) ×2 IMPLANT
PROBE NERVBE PRASS .33 (MISCELLANEOUS) ×2 IMPLANT
SHEARS HARMONIC 9CM CVD (BLADE) ×2 IMPLANT
SPONGE INTESTINAL PEANUT (DISPOSABLE) ×2 IMPLANT
SUT ETHILON 2 0 FS 18 (SUTURE) ×2 IMPLANT
SUT PROLENE 6 0 CC 1 (SUTURE) IMPLANT
SUT SILK 2 0 FS (SUTURE) ×2 IMPLANT
SUT SILK 3 0 REEL (SUTURE) ×2 IMPLANT
SUT VICRYL 4-0 PS2 18IN ABS (SUTURE) ×4 IMPLANT
TAPE CLOTH 4X10 WHT NS (GAUZE/BANDAGES/DRESSINGS) ×2 IMPLANT
TOWEL OR 17X26 10 PK STRL BLUE (TOWEL DISPOSABLE) ×2 IMPLANT
TRAY ENT MC OR (CUSTOM PROCEDURE TRAY) ×2 IMPLANT
TRAY FOLEY CATH 14FRSI W/METER (CATHETERS) IMPLANT
TUBE ENDOTRAC EMG 7X10.2 (MISCELLANEOUS) ×2 IMPLANT
TUBE ENDOTRAC EMG 8X11.3 (MISCELLANEOUS) IMPLANT
TUBE ENDOTRACH  EMG 6MMTUBE EN (MISCELLANEOUS)
TUBE ENDOTRACH EMG 6MMTUBE EN (MISCELLANEOUS) IMPLANT

## 2014-07-08 NOTE — Transfer of Care (Signed)
Immediate Anesthesia Transfer of Care Note  Patient: Brittany Rubio  Procedure(s) Performed: Procedure(s): Completion THYROIDECTOMY (Right)  Patient Location: PACU  Anesthesia Type:General  Level of Consciousness: awake, alert  and oriented  Airway & Oxygen Therapy: Patient Spontanous Breathing and Patient connected to face mask oxygen  Post-op Assessment: Report given to PACU RN  Post vital signs: Reviewed and stable  Complications: No apparent anesthesia complications

## 2014-07-08 NOTE — Anesthesia Preprocedure Evaluation (Addendum)
Anesthesia Evaluation  Patient identified by MRN, date of birth, ID band Patient awake    Reviewed: Allergy & Precautions, H&P , NPO status , Patient's Chart, lab work & pertinent test results  History of Anesthesia Complications Negative for: history of anesthetic complications  Airway Mallampati: III  TM Distance: >3 FB Neck ROM: Full    Dental  (+) Teeth Intact   Pulmonary sleep apnea and Continuous Positive Airway Pressure Ventilation ,  + rhonchi         Cardiovascular hypertension, Pt. on medications - angina- Past MI and - CHF Rhythm:Regular     Neuro/Psych  Headaches, negative psych ROS   GI/Hepatic Neg liver ROS, GERD-  Medicated and Controlled,  Endo/Other  diabetes, Type 2Morbid obesityThyroid ca s/p partial thyroidectomy  Renal/GU negative Renal ROS     Musculoskeletal   Abdominal   Peds  Hematology   Anesthesia Other Findings   Reproductive/Obstetrics                            Anesthesia Physical Anesthesia Plan  ASA: III  Anesthesia Plan: General   Post-op Pain Management:    Induction: Intravenous  Airway Management Planned: Oral ETT and Video Laryngoscope Planned  Additional Equipment: None  Intra-op Plan:   Post-operative Plan: Extubation in OR  Informed Consent: I have reviewed the patients History and Physical, chart, labs and discussed the procedure including the risks, benefits and alternatives for the proposed anesthesia with the patient or authorized representative who has indicated his/her understanding and acceptance.   Dental advisory given  Plan Discussed with: CRNA, Anesthesiologist and Surgeon  Anesthesia Plan Comments:        Anesthesia Quick Evaluation

## 2014-07-08 NOTE — Brief Op Note (Signed)
07/07/2014 - 07/08/2014  12:54 PM  PATIENT:  Larina Bras  53 y.o. female  PRE-OPERATIVE DIAGNOSIS:  Papillary thyroid carcinoma  POST-OPERATIVE DIAGNOSIS:  Papillary thyroid carcinoma  PROCEDURE:  Procedure(s): Completion THYROIDECTOMY (Right)  SURGEON:  Surgeon(s) and Role:    * Ascencion Dike, MD - Primary  PHYSICIAN ASSISTANT:   ASSISTANTS: none   ANESTHESIA:   general  EBL:  Total I/O In: 1400 [I.V.:1400] Out: 100 [Blood:100]  BLOOD ADMINISTERED:none  DRAINS: (#10) Jackson-Pratt drain(s) with closed bulb suction in the neck   LOCAL MEDICATIONS USED:  LIDOCAINE   SPECIMEN:  Source of Specimen:  Right thyroid lobe  DISPOSITION OF SPECIMEN:  PATHOLOGY  COUNTS:  YES  TOURNIQUET:  * No tourniquets in log *  DICTATION: .Other Dictation: Dictation Number [id number]  PLAN OF CARE: Admit for overnight observation  PATIENT DISPOSITION:  PACU - hemodynamically stable.   Delay start of Pharmacological VTE agent (>24hrs) due to surgical blood loss or risk of bleeding: no

## 2014-07-08 NOTE — Anesthesia Postprocedure Evaluation (Signed)
  Anesthesia Post-op Note  Patient: Brittany Rubio  Procedure(s) Performed: Procedure(s): Completion THYROIDECTOMY (Right)  Patient Location: PACU  Anesthesia Type:General  Level of Consciousness: responds to stimulation  Airway and Oxygen Therapy: Patient Spontanous Breathing and Patient connected to nasal cannula oxygen  Post-op Pain: mild  Post-op Assessment: Post-op Vital signs reviewed, Patient's Cardiovascular Status Stable, Respiratory Function Stable, Patent Airway, No signs of Nausea or vomiting and Pain level controlled  Post-op Vital Signs: Reviewed and stable  Last Vitals:  Filed Vitals:   07/08/14 1430  BP:   Pulse: 96  Temp: 36.3 C  Resp: 17    Complications: No apparent anesthesia complications

## 2014-07-08 NOTE — Progress Notes (Signed)
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 However, a small satellite isthmus nodule was later found to have papillary thyroid carcinoma.  The pathology findings are discussed with the family.  The decision is made to proceed with completion right thyroidectomy today.

## 2014-07-09 LAB — CALCIUM
Calcium: 8.7 mg/dL (ref 8.4–10.5)
Calcium: 8.4 mg/dL (ref 8.4–10.5)
Calcium: 8.4 mg/dL (ref 8.4–10.5)

## 2014-07-09 MED ORDER — POLYETHYLENE GLYCOL 3350 17 G PO PACK
17.0000 g | PACK | Freq: Every day | ORAL | Status: DC
  Administered 2014-07-09 – 2014-07-10 (×2): 17 g via ORAL
  Filled 2014-07-09: qty 1

## 2014-07-09 MED ORDER — POLYETHYLENE GLYCOL 3350 17 G PO PACK: 17.0000 g | PACK | Freq: Every day | ORAL | Status: DC

## 2014-07-10 LAB — CALCIUM: Calcium: 8.4 mg/dL (ref 8.4–10.5)

## 2014-07-10 MED ORDER — CALCIUM CARBONATE 1250 (500 CA) MG PO TABS: 1.0000 | ORAL_TABLET | Freq: Two times a day (BID) | ORAL | Status: DC

## 2014-07-10 MED ORDER — LEVOTHYROXINE SODIUM 100 MCG PO TABS: 100.0000 ug | ORAL_TABLET | Freq: Every day | ORAL | Status: DC

## 2014-07-10 NOTE — Progress Notes (Signed)
Reviewed discharge instructions, Rx's, and reasons to call the doctor with pt and pt's husband at the bedside. Questions answered. Pt ready for discharge.

## 2014-07-10 NOTE — Discharge Instructions (Signed)
Thyroidectomy Care After Refer to this sheet in the next few weeks. These instructions provide you with general information on caring for yourself after you leave the hospital. Your caregiver also may give you specific instructions. Your treatment has been planned according to the most current medical practices available, but problems sometimes occur. Call your caregiver if you have any problems or questions after your procedure. HOME CARE INSTRUCTIONS   It is normal to be sore for a few weeks following surgery. See your caregiver if your pain seems to be getting worse rather than better.  You may resume a normal diet and activities as directed by your caregiver.  Avoid lifting weight greater than 20 lb (9 kg) or participating in heavy exercise or contact sports for 10 days or as instructed by your caregiver.  Make an appointment to see your caregiver for follow up. SEEK MEDICAL CARE IF:   You have increased bleeding from your wound.  You have redness, swelling, or increasing pain from your wound or in your neck.  There is pus coming from your wound.  You have an oral temperature above 102 F (38.9 C).  There is a bad smell coming from the wound or dressing.  You develop lightheadedness or feel faint.  You develop numbness, tingling, or muscle spasms in your arms, hands, feet, or face.  You have difficulty swallowing. SEEK IMMEDIATE MEDICAL CARE IF:   You develop a rash.  You have difficulty breathing.  You hear whistling noises that come from your chest.  You develop a cough that becomes increasingly worse.  You develop any reaction or side effects to medicines given.  There is swelling in your neck.  You develop changes in speech or hoarseness, which is getting worse. MAKE SURE YOU:   Understand these instructions.  Will watch your condition.  Will get help right away if you are not doing well or get worse. Document Released: 01/19/2005 Document Revised:  09/24/2011 Document Reviewed: 09/08/2010 [facility] Patient Information 2015 [place], [place]. This information is not intended to replace advice given to you by your health care provider. Make sure you discuss any questions you have with your health care provider.

## 2014-07-10 NOTE — Discharge Summary (Signed)
Physician Discharge Summary  Patient ID: Brittany Rubio MRN: [id number] DOB/AGE: August 03, 1960 53 y.o.  Admit date: 07/07/2014 Discharge date: 07/10/2014  Admission Diagnoses: Left thyroid mass  Discharge Diagnoses: Papillary thyroid carcinoma  Discharged Condition: good  Hospital Course: Pt's original hemithyroidectomy specimen has a nodule that was positive for papillary thyroid carcinoma. She underwent completion thyroidectomy on the following day. She had an uneventful recovery course. Pt tolerated po well. No bleeding. No stridor.  Consults: None  Significant Diagnostic Studies: labs: Calcium  Treatments: surgery: hemithyroidectomy and completion thyroidectomy  Discharge Exam: Blood pressure 145/83, pulse 82, temperature 97.9 F (36.6 C), temperature source Oral, resp. rate 19, height 5\' 5"  (1.651 m), weight 242 lb 11.2 oz (110.088 kg), SpO2 100 %. Incision/Wound:c/d/i No drainage, erythema.  Disposition: 01-Home or Self Care  Discharge Instructions    Activity as tolerated - No restrictions    Complete by:  As directed      Diet general    Complete by:  As directed             Medication List    TAKE these medications        amphetamine-dextroamphetamine 20 MG tablet  Commonly known as:  ADDERALL  Take 20 mg by mouth daily.     butorphanol 10 MG/ML nasal spray  Commonly known as:  STADOL  Place 1 spray into the nose every 4 (four) hours as needed. For migraines     calcium carbonate 1250 MG tablet  Commonly known as:  OS-CAL - dosed in mg of elemental calcium  Take 1 tablet (500 mg of elemental calcium total) by mouth 2 (two) times daily with a meal.     esomeprazole 20 MG capsule  Commonly known as:  NEXIUM  Take 20 mg by mouth daily at 12 noon.     FARXIGA 10 MG Tabs tablet  Generic drug:  dapagliflozin propanediol  Take 10 mg by mouth daily.     HYDROcodone-acetaminophen 5-325 MG per tablet  Commonly known as:  NORCO/VICODIN  Take 1-2 tablets  by mouth every 6 (six) hours as needed for moderate pain or severe pain.     ibuprofen 800 MG tablet  Commonly known as:  ADVIL,MOTRIN  Take 1 tablet (800 mg total) by mouth 3 (three) times daily.     KOMBIGLYZE XR 2.11-998 MG Tb24  Generic drug:  Saxagliptin-Metformin  Take 2 tablets by mouth daily.     levocetirizine 5 MG tablet  Commonly known as:  XYZAL  Take 5 mg by mouth every evening. As needed for allergies     levothyroxine 100 MCG tablet  Commonly known as:  SYNTHROID  Take 1 tablet (100 mcg total) by mouth daily before breakfast.     lisinopril-hydrochlorothiazide 20-12.5 MG per tablet  Commonly known as:  PRINZIDE,ZESTORETIC  Take 1 tablet by mouth daily.     LIVALO 4 MG Tabs  Generic drug:  Pitavastatin Calcium  Take 4 mg by mouth daily.     MATZIM LA 240 MG 24 hr tablet  Generic drug:  diltiazem  Take 240 mg by mouth daily.     oxyCODONE-acetaminophen 5-325 MG per tablet  Commonly known as:  ROXICET  Take 1-2 tablets by mouth every 4 (four) hours as needed for moderate pain or severe pain.     traZODone 50 MG tablet  Commonly known as:  DESYREL  Take 50 mg by mouth at bedtime.           Follow-up Information  Follow up with Ascencion Dike, MD On 07/14/2014.   Specialty:  Otolaryngology   Why:  at 4:10pm   Contact information:   [address] [place] [address] [phone]       Follow up with Ascencion Dike, MD On 07/14/2014.   Specialty:  Otolaryngology   Why:  As scheduled   Contact information:   [place] [address] [phone]       Signed: Ascencion Dike 07/10/2014, 10:31 AM

## 2014-07-11 NOTE — Op Note (Signed)
[name], Brittany [name]:  [id number]  MEDICAL RECORD NO.:  [id number]  LOCATION:                                 FACILITY:  PHYSICIAN:  Leta Baptist, MD            DATE OF BIRTH:  19-Jan-1961  DATE OF PROCEDURE:  07/08/2014 DATE OF DISCHARGE:  07/07/2014                              OPERATIVE REPORT   INDICATIONS FOR PROCEDURE:  The patient is a 53 year old female with a history of multinodular thyroid goiter.  She underwent left hemithyroidectomy 1 day ago to treat her compressive symptoms.  After the surgery, a small nodule within her isthmus was noted to have elements of papillary thyroid carcinoma.  As a result, the decision was made for the patient to undergo a completion of thyroidectomy to remove the residual right thyroid lobe.  The risks, benefits, alternatives, and details of the procedure were discussed with the patient and her husband.  Questions were invited and answered.  Informed consent was obtained.  DESCRIPTION:  The patient was taken to the operating room and placed supine on the operating table.  General endotracheal tube anesthesia was administered by the anesthesiologist.  Preop IV antibiotic was given. The patient was positioned, and prepped and draped in standard fashion for a thyroidectomy surgery.  A 1% lidocaine with 1:100,000 epinephrine was injected at the site of incisions.  A lower transverse neck incision was made.  The portion of the incision was carried out through her previous operative site.  The strap muscles were then divided at midline and retracted laterally.  The large right thyroid lobe was noted.  The right thyroid lobe was carefully dissected free from the surrounding soft tissue.  The superior and middle thyroid vessels were ligated and divided.  The right recurrent laryngeal nerve was identified and preserved.  The nerve was noted to be functional throughout the case.  Parathyroid tissues were identified and  preserved. It should be noted that a possible parathyroid/lymph node nodule was noted around the paratracheal area.  It was removed and sent to the Pathology Department for a permanent histologic identification.  The surgical site was copiously irrigated.  A #10 JP drain was placed.  The strap muscles were closed with interrupted 4-0 Vicryl sutures.  The incision was closed in layers with 4-0 Vicryl and Dermabond.  Care of the patient was turned over to the anesthesiologist.  The patient was awakened from anesthesia without difficulty.  She was extubated and transferred to the recovery room in good condition.  OPERATIVE FINDINGS:  The remaining right thyroid lobe was removed without difficulty.  SPECIMEN:  Right thyroid lobe.  FOLLOWUP CARE:  The patient will be admitted for overnight observation. Her postop calcium level will be monitored.     Leta Baptist, MD     ST/MEDQ  D:  07/08/2014  T:  07/08/2014  Job:  [id number]

## 2014-07-12 ENCOUNTER — Encounter (HOSPITAL_COMMUNITY): Payer: Self-pay | Admitting: Otolaryngology

## 2014-07-27 ENCOUNTER — Ambulatory Visit
Admission: RE | Admit: 2014-07-27 | Discharge: 2014-07-27 | Disposition: A | Discharge status: Home / Self Care | Payer: BC Managed Care – PPO | Source: Ambulatory Visit | Attending: Internal Medicine | Admitting: Internal Medicine

## 2014-07-27 DIAGNOSIS — Z853 Personal history of malignant neoplasm of breast: Secondary | ICD-10-CM

## 2014-08-30 ENCOUNTER — Encounter (HOSPITAL_COMMUNITY)
Admission: RE | Admit: 2014-08-30 | Discharge: 2014-08-30 | Disposition: A | Discharge status: Home / Self Care | Payer: BC Managed Care – PPO | Source: Ambulatory Visit | Attending: Endocrinology | Admitting: Endocrinology

## 2014-08-30 ENCOUNTER — Other Ambulatory Visit (HOSPITAL_COMMUNITY): Payer: Self-pay | Admitting: Endocrinology

## 2014-08-30 DIAGNOSIS — C73 Malignant neoplasm of thyroid gland: Secondary | ICD-10-CM | POA: Diagnosis present

## 2014-08-30 MED ORDER — THYROTROPIN ALFA 1.1 MG IM SOLR
0.9000 mg | INTRAMUSCULAR | Status: AC
  Administered 2014-08-30: 0.9 mg via INTRAMUSCULAR

## 2014-08-31 ENCOUNTER — Encounter (HOSPITAL_COMMUNITY)
Admission: RE | Admit: 2014-08-31 | Discharge: 2014-08-31 | Disposition: A | Discharge status: Home / Self Care | Payer: BC Managed Care – PPO | Source: Ambulatory Visit | Attending: Endocrinology | Admitting: Endocrinology

## 2014-08-31 DIAGNOSIS — C73 Malignant neoplasm of thyroid gland: Secondary | ICD-10-CM | POA: Diagnosis not present

## 2014-08-31 MED ORDER — THYROTROPIN ALFA 1.1 MG IM SOLR
0.9000 mg | INTRAMUSCULAR | Status: AC
Start: 2014-08-31 — End: 2014-08-31
  Administered 2014-08-31: 0.9 mg via INTRAMUSCULAR

## 2014-09-01 ENCOUNTER — Encounter (HOSPITAL_COMMUNITY)
Admission: RE | Admit: 2014-09-01 | Discharge: 2014-09-01 | Disposition: A | Discharge status: Home / Self Care | Payer: BC Managed Care – PPO | Source: Ambulatory Visit | Attending: Endocrinology | Admitting: Endocrinology

## 2014-09-01 DIAGNOSIS — C73 Malignant neoplasm of thyroid gland: Secondary | ICD-10-CM | POA: Diagnosis not present

## 2014-09-01 LAB — HCG, SERUM, QUALITATIVE: Preg, Serum: NEGATIVE

## 2014-09-01 MED ORDER — SODIUM IODIDE I 131 CAPSULE
70.3000 | Freq: Once | INTRAVENOUS | Status: AC | PRN
  Administered 2014-09-01: 70.3 via ORAL

## 2014-09-08 ENCOUNTER — Encounter (HOSPITAL_COMMUNITY)
Admission: RE | Admit: 2014-09-08 | Discharge: 2014-09-08 | Disposition: A | Discharge status: Home / Self Care | Payer: BC Managed Care – PPO | Source: Ambulatory Visit | Attending: Endocrinology | Admitting: Endocrinology

## 2014-09-08 DIAGNOSIS — C73 Malignant neoplasm of thyroid gland: Secondary | ICD-10-CM | POA: Insufficient documentation

## 2014-10-06 ENCOUNTER — Encounter (HOSPITAL_COMMUNITY): Payer: Self-pay | Admitting: *Deleted

## 2014-10-06 ENCOUNTER — Emergency Department (HOSPITAL_COMMUNITY): Payer: Worker's Compensation

## 2014-10-06 ENCOUNTER — Emergency Department (HOSPITAL_COMMUNITY)
Admission: EM | Admit: 2014-10-06 | Discharge: 2014-10-06 | Disposition: A | Discharge status: Home / Self Care | Payer: Worker's Compensation | Attending: Emergency Medicine | Admitting: Emergency Medicine

## 2014-10-06 DIAGNOSIS — E780 Pure hypercholesterolemia: Secondary | ICD-10-CM | POA: Insufficient documentation

## 2014-10-06 DIAGNOSIS — Z79899 Other long term (current) drug therapy: Secondary | ICD-10-CM | POA: Insufficient documentation

## 2014-10-06 DIAGNOSIS — Y9389 Activity, other specified: Secondary | ICD-10-CM | POA: Insufficient documentation

## 2014-10-06 DIAGNOSIS — Z9981 Dependence on supplemental oxygen: Secondary | ICD-10-CM | POA: Insufficient documentation

## 2014-10-06 DIAGNOSIS — Z853 Personal history of malignant neoplasm of breast: Secondary | ICD-10-CM | POA: Diagnosis not present

## 2014-10-06 DIAGNOSIS — S3992XA Unspecified injury of lower back, initial encounter: Secondary | ICD-10-CM | POA: Insufficient documentation

## 2014-10-06 DIAGNOSIS — M19012 Primary osteoarthritis, left shoulder: Secondary | ICD-10-CM | POA: Diagnosis not present

## 2014-10-06 DIAGNOSIS — I10 Essential (primary) hypertension: Secondary | ICD-10-CM | POA: Insufficient documentation

## 2014-10-06 DIAGNOSIS — W01198A Fall on same level from slipping, tripping and stumbling with subsequent striking against other object, initial encounter: Secondary | ICD-10-CM | POA: Insufficient documentation

## 2014-10-06 DIAGNOSIS — M791 Myalgia, unspecified site: Secondary | ICD-10-CM

## 2014-10-06 DIAGNOSIS — E042 Nontoxic multinodular goiter: Secondary | ICD-10-CM | POA: Insufficient documentation

## 2014-10-06 DIAGNOSIS — G4733 Obstructive sleep apnea (adult) (pediatric): Secondary | ICD-10-CM | POA: Insufficient documentation

## 2014-10-06 DIAGNOSIS — W19XXXA Unspecified fall, initial encounter: Secondary | ICD-10-CM

## 2014-10-06 DIAGNOSIS — S9001XA Contusion of right ankle, initial encounter: Secondary | ICD-10-CM | POA: Insufficient documentation

## 2014-10-06 DIAGNOSIS — Y998 Other external cause status: Secondary | ICD-10-CM | POA: Insufficient documentation

## 2014-10-06 DIAGNOSIS — Y9289 Other specified places as the place of occurrence of the external cause: Secondary | ICD-10-CM | POA: Diagnosis not present

## 2014-10-06 DIAGNOSIS — K219 Gastro-esophageal reflux disease without esophagitis: Secondary | ICD-10-CM | POA: Diagnosis not present

## 2014-10-06 DIAGNOSIS — S4991XA Unspecified injury of right shoulder and upper arm, initial encounter: Secondary | ICD-10-CM | POA: Diagnosis not present

## 2014-10-06 DIAGNOSIS — S199XXA Unspecified injury of neck, initial encounter: Secondary | ICD-10-CM | POA: Diagnosis not present

## 2014-10-06 DIAGNOSIS — T148XXA Other injury of unspecified body region, initial encounter: Secondary | ICD-10-CM

## 2014-10-06 DIAGNOSIS — E119 Type 2 diabetes mellitus without complications: Secondary | ICD-10-CM | POA: Insufficient documentation

## 2014-10-06 DIAGNOSIS — S99911A Unspecified injury of right ankle, initial encounter: Secondary | ICD-10-CM | POA: Diagnosis present

## 2014-10-06 MED ORDER — CYCLOBENZAPRINE HCL 5 MG PO TABS: 5.0000 mg | ORAL_TABLET | Freq: Three times a day (TID) | ORAL | Status: DC | PRN

## 2014-10-06 MED ORDER — LIDOCAINE 5 % EX PTCH: 1.0000 | MEDICATED_PATCH | CUTANEOUS | Status: DC

## 2014-10-06 MED ORDER — ONDANSETRON 4 MG PO TBDP
4.0000 mg | ORAL_TABLET | Freq: Once | ORAL | Status: AC
  Administered 2014-10-06: 4 mg via ORAL
  Filled 2014-10-06: qty 1

## 2014-10-06 MED ORDER — NAPROXEN 500 MG PO TABS: 500.0000 mg | ORAL_TABLET | Freq: Two times a day (BID) | ORAL | Status: DC

## 2014-10-06 MED ORDER — OXYCODONE-ACETAMINOPHEN 5-325 MG PO TABS
2.0000 | ORAL_TABLET | Freq: Once | ORAL | Status: AC
  Administered 2014-10-06: 2 via ORAL
  Filled 2014-10-06: qty 2

## 2014-10-06 MED ORDER — OXYCODONE-ACETAMINOPHEN 5-325 MG PO TABS: 2.0000 | ORAL_TABLET | ORAL | Status: DC | PRN

## 2014-10-06 NOTE — ED Notes (Signed)
Pt is in stable condition upon d/c and is escorted from ED via wheelchair by staff. 

## 2014-10-06 NOTE — Discharge Instructions (Signed)
Contusion A contusion is a deep bruise. Contusions are the result of an injury that caused bleeding under the skin. The contusion may turn blue, purple, or yellow. Minor injuries will give you a painless contusion, but more severe contusions may stay painful and swollen for a few weeks.  CAUSES  A contusion is usually caused by a blow, trauma, or direct force to an area of the body. SYMPTOMS   Swelling and redness of the injured area.  Bruising of the injured area.  Tenderness and soreness of the injured area.  Pain. DIAGNOSIS  The diagnosis can be made by taking a history and physical exam. An X-ray, CT scan, or MRI may be needed to determine if there were any associated injuries, such as fractures. TREATMENT  Specific treatment will depend on what area of the body was injured. In general, the best treatment for a contusion is resting, icing, elevating, and applying cold compresses to the injured area. Over-the-counter medicines may also be recommended for pain control. Ask your caregiver what the best treatment is for your contusion. HOME CARE INSTRUCTIONS   Put ice on the injured area.  Put ice in a plastic bag.  Place a towel between your skin and the bag.  Leave the ice on for 15-20 minutes, 3-4 times a day, or as directed by your health care provider.  Only take over-the-counter or prescription medicines for pain, discomfort, or fever as directed by your caregiver. Your caregiver may recommend avoiding anti-inflammatory medicines (aspirin, ibuprofen, and naproxen) for 48 hours because these medicines may increase bruising.  Rest the injured area.  If possible, elevate the injured area to reduce swelling. SEEK IMMEDIATE MEDICAL CARE IF:   You have increased bruising or swelling.  You have pain that is getting worse.  Your swelling or pain is not relieved with medicines. MAKE SURE YOU:   Understand these instructions.  Will watch your condition.  Will get help right  away if you are not doing well or get worse. Document Released: 04/11/2005 Document Revised: 07/07/2013 Document Reviewed: 05/07/2011 [facility] Patient Information 2015 [place], [place]. This information is not intended to replace advice given to you by your health care provider. Make sure you discuss any questions you have with your health care provider.  Muscle Pain Muscle pain (myalgia) may be caused by many things, including:  Overuse or muscle strain, especially if you are not in shape. This is the most common cause of muscle pain.  Injury.  Bruises.  Viruses, such as the flu.  Infectious diseases.  Fibromyalgia, which is a chronic condition that causes muscle tenderness, fatigue, and headache.  Autoimmune diseases, including lupus.  Certain drugs, including ACE inhibitors and statins. Muscle pain may be mild or severe. In most cases, the pain lasts only a short time and goes away without treatment. To diagnose the cause of your muscle pain, your health care provider will take your medical history. This means he or she will ask you when your muscle pain began and what has been happening. If you have not had muscle pain for very long, your health care provider may want to wait before doing much testing. If your muscle pain has lasted a long time, your health care provider may want to run tests right away. If your health care provider thinks your muscle pain may be caused by illness, you may need to have additional tests to rule out certain conditions.  Treatment for muscle pain depends on the cause. Home care is often  enough to relieve muscle pain. Your health care provider may also prescribe anti-inflammatory medicine. HOME CARE INSTRUCTIONS Watch your condition for any changes. The following actions may help to lessen any discomfort you are feeling:  Only take over-the-counter or prescription medicines as directed by your health care provider.  Apply ice to the sore muscle:  Put  ice in a plastic bag.  Place a towel between your skin and the bag.  Leave the ice on for 15-20 minutes, 3-4 times a day.  You may alternate applying hot and cold packs to the muscle as directed by your health care provider.  If overuse is causing your muscle pain, slow down your activities until the pain goes away.  Remember that it is normal to feel some muscle pain after starting a workout program. Muscles that have not been used often will be sore at first.  Do regular, gentle exercises if you are not usually active.  Warm up before exercising to lower your risk of muscle pain.  Do not continue working out if the pain is very bad. Bad pain could mean you have injured a muscle. SEEK MEDICAL CARE IF:  Your muscle pain gets worse, and medicines do not help.  You have muscle pain that lasts longer than 3 days.  You have a rash or fever along with muscle pain.  You have muscle pain after a tick bite.  You have muscle pain while working out, even though you are in good physical condition.  You have redness, soreness, or swelling along with muscle pain.  You have muscle pain after starting a new medicine or changing the dose of a medicine. SEEK IMMEDIATE MEDICAL CARE IF:  You have trouble breathing.  You have trouble swallowing.  You have muscle pain along with a stiff neck, fever, and vomiting.  You have severe muscle weakness or cannot move part of your body. MAKE SURE YOU:   Understand these instructions.  Will watch your condition.  Will get help right away if you are not doing well or get worse. Document Released: 05/24/2006 Document Revised: 07/07/2013 Document Reviewed: 04/28/2013 [facility] Patient Information 2015 [place], [place]. This information is not intended to replace advice given to you by your health care provider. Make sure you discuss any questions you have with your health care provider.

## 2014-10-06 NOTE — ED Provider Notes (Signed)
CSN: [id number]     Arrival date & time 10/06/14  1346 History  This chart was scribed for non-physician practitioner, Margarita Mail, PA-C, working with Daleen Bo, MD by Ladene Artist, ED Scribe. This patient was seen in room TR11C/TR11C and the patient's care was started at 3:18 PM.   Chief Complaint  Patient presents with  . Fall   The history is provided by the patient. No language interpreter was used.   HPI Comments: Brittany Rubio is a 54 y.o. female, with a h/o HTN, high cholesterol, DM, who presents to the Emergency Department complaining of a fall that occurred earlier today. Pt reports slipping and falling on a wet floor into a split. She reports associated constant R ankle pain, R knee pain, R hip pain, R shoulder pain, back pain. Pain is exacerbated with bearing weight and movement. Pt reports tapping her head upon falling. She denies LOC and abdominal pain.   Past Medical History  Diagnosis Date  . Hypertension 11/26/2011    sees Dr. Bryon Lions  . GERD (gastroesophageal reflux disease)     "takes over the counters as needed"  . High cholesterol   . OSA on CPAP   . Type II diabetes mellitus   . Migraines     "maybe once q 3 months" (07/07/2014)  . Arthritis     "left shoulder" (07/07/2014)  . Breast cancer     "left"  . Multinodular thyroid 2015   Past Surgical History  Procedure Laterality Date  . Diaphragm surgery  1986    "trauma"  . Abdominal hysterectomy  ? 1997  . Partial mastectomy with needle localization  08/12/2012    Procedure: PARTIAL MASTECTOMY WITH NEEDLE LOCALIZATION;  Surgeon: Marcello Moores A. Cornett, MD;  Location: [place];  Service: General;  Laterality: Right;  . Thyroidectomy, partial Left 07/07/2014    hemi  . Reduction mammaplasty Bilateral 1995  . Breast lumpectomy Left 2009  . Breast lumpectomy Right 07/2012    "not a mastectomy"  . Thyroidectomy Left 07/07/2014    Procedure: LEFT HEMI-THYROIDECTOMY;  Surgeon: Ascencion Dike, MD;  Location:  [place];  Service: ENT;  Laterality: Left;  . Thyroidectomy Right 07/08/2014    Procedure: Completion THYROIDECTOMY;  Surgeon: Ascencion Dike, MD;  Location: [facility];  Service: ENT;  Laterality: Right;   Family History  Problem Relation Age of Onset  . Cancer Mother     Pancreatic   History  Substance Use Topics  . Smoking status: Never Smoker   . Smokeless tobacco: Never Used  . Alcohol Use: Yes     Comment: 07/07/2014 "a few times/year; weddings, anniversary, etc"   OB History    No data available     Review of Systems  Gastrointestinal: Negative for abdominal pain.  Musculoskeletal: Positive for back pain and arthralgias.  Skin: Negative for color change.   Allergies  Shellfish allergy  Home Medications   Prior to Admission medications   Medication Sig Start Date End Date Taking? Authorizing Provider  amphetamine-dextroamphetamine (ADDERALL) 20 MG tablet Take 20 mg by mouth daily.  08/26/12   Historical Provider, MD  butorphanol (STADOL) 10 MG/ML nasal spray Place 1 spray into the nose every 4 (four) hours as needed. For migraines    Historical Provider, MD  calcium carbonate (OS-CAL - DOSED IN MG OF ELEMENTAL CALCIUM) 1250 MG tablet Take 1 tablet (500 mg of elemental calcium total) by mouth 2 (two) times daily with a meal. 07/10/14   Su  Benjamine Mola, MD  Dapagliflozin Propanediol (FARXIGA) 10 MG TABS Take 10 mg by mouth daily.    Historical Provider, MD  diltiazem (MATZIM LA) 240 MG 24 hr tablet Take 240 mg by mouth daily.    Glendale Chard, MD  esomeprazole (NEXIUM) 20 MG capsule Take 20 mg by mouth daily at 12 noon.    Historical Provider, MD  HYDROcodone-acetaminophen (NORCO/VICODIN) 5-325 MG per tablet Take 1-2 tablets by mouth every 6 (six) hours as needed for moderate pain or severe pain. Patient not taking: Reported on 06/24/2014 05/15/14   Jarrett Soho Muthersbaugh, PA-C  ibuprofen (ADVIL,MOTRIN) 800 MG tablet Take 1 tablet (800 mg total) by mouth 3 (three) times daily. Patient not  taking: Reported on 06/24/2014 05/15/14   Jarrett Soho Muthersbaugh, PA-C  levocetirizine (XYZAL) 5 MG tablet Take 5 mg by mouth every evening. As needed for allergies    Historical Provider, MD  levothyroxine (SYNTHROID) 100 MCG tablet Take 1 tablet (100 mcg total) by mouth daily before breakfast. 07/10/14   Leta Baptist, MD  lisinopril-hydrochlorothiazide (PRINZIDE,ZESTORETIC) 20-12.5 MG per tablet Take 1 tablet by mouth daily.     Historical Provider, MD  LIVALO 4 MG TABS Take 4 mg by mouth daily.  06/14/14   Historical Provider, MD  oxyCODONE-acetaminophen (ROXICET) 5-325 MG per tablet Take 1-2 tablets by mouth every 4 (four) hours as needed for moderate pain or severe pain. 07/07/14   Leta Baptist, MD  Saxagliptin-Metformin (KOMBIGLYZE XR) 2.11-998 MG TB24 Take 2 tablets by mouth daily.     Glendale Chard, MD  traZODone (DESYREL) 50 MG tablet Take 50 mg by mouth at bedtime. 06/15/14   Historical Provider, MD   BP 162/90 mmHg  Pulse 97  Temp(Src) 98.6 F (37 C) (Oral)  Resp 18  Ht 5' 4.25" (1.632 m)  Wt 240 lb (108.863 kg)  BMI 40.87 kg/m2  SpO2 100% Physical Exam  Constitutional: She is oriented to person, place, and time. She appears well-developed and well-nourished. No distress.  HENT:  Head: Normocephalic and atraumatic.  Eyes: Conjunctivae and EOM are normal.  Neck: Neck supple. No tracheal deviation present.  Cardiovascular: Normal rate.   Pulmonary/Chest: Effort normal. No respiratory distress.  Musculoskeletal: Normal range of motion.  Tender in R lateral malleolus. Good pulses. Good strength with flexion and extension at knee. No bony tenderness along femur. No bony tenderness in R shoulder but tenderness in trapezius and paraspinal muscles to the R of C7.  Neurological: She is alert and oriented to person, place, and time.  Skin: Skin is warm and dry.  Bruising on R ankle. No visible bruising to R thigh.  Psychiatric: She has a normal mood and affect. Her behavior is normal.  Nursing  note and vitals reviewed.  ED Course  Procedures (including critical care time) DIAGNOSTIC STUDIES: Oxygen Saturation is 100% on RA, normal by my interpretation.    COORDINATION OF CARE: 3:24 PM-Discussed treatment plan which includes XR with pt at bedside and pt agreed to plan.   Labs Review Labs Reviewed - No data to display  Imaging Review No results found.   EKG Interpretation None      MDM   Final diagnoses:  Fall  Myalgia  Contusion    Patient X-Rays negative for obvious fracture or dislocation. Pain managed in ED. Pt advised to follow up with orthopedics/pcp if symptoms persist for possibility of missed fracture diagnosis. Patient given crutches while in ED, conservative therapy recommended and discussed. Patient will be dc home & is agreeable with  above plan.   I personally performed the services described in this documentation, which was scribed in my presence. The recorded information has been reviewed and is accurate.      Margarita Mail, PA-C 10/11/14 [place], MD 10/11/14 1148

## 2014-10-06 NOTE — ED Notes (Signed)
Pt reports slipping and falling on wet floor. Hit her head on floor but denies loc. Having pain to right ankle, knee, hip, back and shoulders.

## 2015-03-11 ENCOUNTER — Emergency Department (HOSPITAL_COMMUNITY)
Admission: EM | Admit: 2015-03-11 | Discharge: 2015-03-11 | Disposition: A | Discharge status: Home / Self Care | Payer: BC Managed Care – PPO | Attending: Emergency Medicine | Admitting: Emergency Medicine

## 2015-03-11 ENCOUNTER — Emergency Department (HOSPITAL_COMMUNITY): Payer: BC Managed Care – PPO

## 2015-03-11 DIAGNOSIS — S6992XA Unspecified injury of left wrist, hand and finger(s), initial encounter: Secondary | ICD-10-CM | POA: Diagnosis present

## 2015-03-11 DIAGNOSIS — Z791 Long term (current) use of non-steroidal anti-inflammatories (NSAID): Secondary | ICD-10-CM | POA: Insufficient documentation

## 2015-03-11 DIAGNOSIS — E119 Type 2 diabetes mellitus without complications: Secondary | ICD-10-CM | POA: Diagnosis not present

## 2015-03-11 DIAGNOSIS — K219 Gastro-esophageal reflux disease without esophagitis: Secondary | ICD-10-CM | POA: Insufficient documentation

## 2015-03-11 DIAGNOSIS — S299XXA Unspecified injury of thorax, initial encounter: Secondary | ICD-10-CM | POA: Diagnosis not present

## 2015-03-11 DIAGNOSIS — I10 Essential (primary) hypertension: Secondary | ICD-10-CM | POA: Insufficient documentation

## 2015-03-11 DIAGNOSIS — E780 Pure hypercholesterolemia: Secondary | ICD-10-CM | POA: Insufficient documentation

## 2015-03-11 DIAGNOSIS — Y9389 Activity, other specified: Secondary | ICD-10-CM | POA: Diagnosis not present

## 2015-03-11 DIAGNOSIS — Z9981 Dependence on supplemental oxygen: Secondary | ICD-10-CM | POA: Diagnosis not present

## 2015-03-11 DIAGNOSIS — Z79899 Other long term (current) drug therapy: Secondary | ICD-10-CM | POA: Diagnosis not present

## 2015-03-11 DIAGNOSIS — G4733 Obstructive sleep apnea (adult) (pediatric): Secondary | ICD-10-CM | POA: Insufficient documentation

## 2015-03-11 DIAGNOSIS — S61412A Laceration without foreign body of left hand, initial encounter: Secondary | ICD-10-CM | POA: Diagnosis not present

## 2015-03-11 DIAGNOSIS — M19012 Primary osteoarthritis, left shoulder: Secondary | ICD-10-CM | POA: Diagnosis not present

## 2015-03-11 DIAGNOSIS — Z853 Personal history of malignant neoplasm of breast: Secondary | ICD-10-CM | POA: Diagnosis not present

## 2015-03-11 DIAGNOSIS — Y92410 Unspecified street and highway as the place of occurrence of the external cause: Secondary | ICD-10-CM | POA: Diagnosis not present

## 2015-03-11 DIAGNOSIS — Y999 Unspecified external cause status: Secondary | ICD-10-CM | POA: Insufficient documentation

## 2015-03-11 DIAGNOSIS — G43009 Migraine without aura, not intractable, without status migrainosus: Secondary | ICD-10-CM | POA: Diagnosis not present

## 2015-03-11 DIAGNOSIS — E042 Nontoxic multinodular goiter: Secondary | ICD-10-CM | POA: Insufficient documentation

## 2015-03-11 MED ORDER — LIDOCAINE HCL (PF) 1 % IJ SOLN
10.0000 mL | Freq: Once | INTRAMUSCULAR | Status: AC
  Administered 2015-03-11: 10 mL via INTRADERMAL

## 2015-03-11 MED ORDER — OXYCODONE-ACETAMINOPHEN 5-325 MG PO TABS: 1.0000 | ORAL_TABLET | ORAL | Status: DC | PRN

## 2015-03-11 MED ORDER — OXYCODONE-ACETAMINOPHEN 5-325 MG PO TABS: 1.0000 | ORAL_TABLET | Freq: Once | ORAL | Status: DC

## 2015-03-11 MED ORDER — LIDOCAINE HCL (PF) 1 % IJ SOLN
10.0000 mL | Freq: Once | INTRAMUSCULAR | Status: AC
  Administered 2015-03-11: 10 mL via INTRADERMAL
  Filled 2015-03-11: qty 10

## 2015-03-11 MED ORDER — ONDANSETRON HCL 4 MG/2ML IJ SOLN
4.0000 mg | Freq: Once | INTRAMUSCULAR | Status: AC
  Administered 2015-03-11: 4 mg via INTRAVENOUS
  Filled 2015-03-11: qty 2

## 2015-03-11 MED ORDER — LIDOCAINE HCL (PF) 1 % IJ SOLN
5.0000 mL | Freq: Once | INTRAMUSCULAR | Status: DC
  Filled 2015-03-11: qty 5

## 2015-03-11 MED ORDER — MORPHINE SULFATE (PF) 4 MG/ML IV SOLN
6.0000 mg | Freq: Once | INTRAVENOUS | Status: AC
  Administered 2015-03-11: 6 mg via INTRAVENOUS

## 2015-03-11 MED ORDER — OXYCODONE-ACETAMINOPHEN 5-325 MG PO TABS
1.0000 | ORAL_TABLET | Freq: Once | ORAL | Status: AC
  Administered 2015-03-11: 1 via ORAL
  Filled 2015-03-11: qty 1

## 2015-03-11 MED ORDER — LORAZEPAM 2 MG/ML IJ SOLN
0.5000 mg | Freq: Once | INTRAMUSCULAR | Status: AC
Start: 2015-03-11 — End: 2015-03-11
  Administered 2015-03-11: 0.5 mg via INTRAVENOUS
  Filled 2015-03-11: qty 1

## 2015-03-11 MED ORDER — MORPHINE SULFATE (PF) 4 MG/ML IV SOLN
6.0000 mg | Freq: Once | INTRAVENOUS | Status: DC
  Filled 2015-03-11: qty 2
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
## 2015-03-11 NOTE — ED Notes (Signed)
Off unit with xray. 

## 2015-03-11 NOTE — ED Notes (Signed)
PA at bedside with suture kit.

## 2015-03-11 NOTE — ED Notes (Signed)
Ortho tech called to apply thumb spica to pt

## 2015-03-11 NOTE — ED Provider Notes (Signed)
LACERATION REPAIR Performed by: Faustino Congress Authorized by: Faustino Congress Consent: Verbal consent obtained. Risks and benefits: risks, benefits and alternatives were discussed Consent given by: patient Patient identity confirmed: provided demographic data Prepped and Draped in normal sterile fashion Wound explored  Laceration Location: L hand, webspace between thumb and index digits  Laceration Length: 4cm  No Foreign Bodies seen or palpated  Anesthesia: local infiltration  Local anesthetic: lidocaine 1% without epinephrine  Anesthetic total: 10 ml  Irrigation method: bulk flow, 1000cc Amount of cleaning: standard  Skin closure: 5-0 [place]  Number of sutures: 11  Technique: simple interrupted  Patient tolerance: Patient tolerated the procedure well with no immediate complications.   Carlisle Cater, PA-C 03/11/15 1328  Gareth Morgan, MD 03/14/15 1000
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
## 2015-03-11 NOTE — Discharge Instructions (Signed)

## 2015-03-11 NOTE — Progress Notes (Signed)
Orthopedic Tech Progress Note Patient Details:  Brittany Rubio 1961/05/21 [id number]  Ortho Devices Type of Ortho Device: Thumb velcro splint Ortho Device/Splint Location: lue Ortho Device/Splint Interventions: Application   [name] 03/11/2015, 1:41 PM

## 2015-05-09 ENCOUNTER — Ambulatory Visit: Payer: Self-pay | Admitting: Surgery

## 2015-08-25 ENCOUNTER — Other Ambulatory Visit (HOSPITAL_COMMUNITY): Payer: Self-pay | Admitting: Endocrinology

## 2015-08-25 DIAGNOSIS — C73 Malignant neoplasm of thyroid gland: Secondary | ICD-10-CM

## 2015-09-05 ENCOUNTER — Encounter (HOSPITAL_COMMUNITY)
Admission: RE | Admit: 2015-09-05 | Discharge: 2015-09-05 | Disposition: A | Discharge status: Home / Self Care | Payer: BC Managed Care – PPO | Source: Ambulatory Visit | Attending: Endocrinology | Admitting: Endocrinology

## 2015-09-05 DIAGNOSIS — C73 Malignant neoplasm of thyroid gland: Secondary | ICD-10-CM | POA: Diagnosis not present

## 2015-09-05 MED ORDER — THYROTROPIN ALFA 1.1 MG IM SOLR
0.9000 mg | INTRAMUSCULAR | Status: AC
  Administered 2015-09-05: 0.9 mg via INTRAMUSCULAR

## 2015-09-05 MED ORDER — STERILE WATER FOR INJECTION IJ SOLN
INTRAMUSCULAR | Status: AC
  Filled 2015-09-05: qty 10

## 2015-09-06 ENCOUNTER — Encounter (HOSPITAL_COMMUNITY)
Admission: RE | Admit: 2015-09-06 | Discharge: 2015-09-06 | Disposition: A | Discharge status: Home / Self Care | Payer: BC Managed Care – PPO | Source: Ambulatory Visit | Attending: Endocrinology | Admitting: Endocrinology

## 2015-09-06 DIAGNOSIS — C73 Malignant neoplasm of thyroid gland: Secondary | ICD-10-CM | POA: Diagnosis not present

## 2015-09-06 MED ORDER — STERILE WATER FOR INJECTION IJ SOLN
INTRAMUSCULAR | Status: AC
  Filled 2015-09-06: qty 10

## 2015-09-06 MED ORDER — STERILE WATER FOR INJECTION IJ SOLN
1.0000 mL | Freq: Once | INTRAMUSCULAR | Status: AC
  Administered 2015-09-06: 1 mL via INTRAMUSCULAR

## 2015-09-06 MED ORDER — THYROTROPIN ALFA 1.1 MG IM SOLR
0.9000 mg | INTRAMUSCULAR | Status: AC
  Administered 2015-09-06: 0.9 mg via INTRAMUSCULAR

## 2015-09-07 ENCOUNTER — Encounter (HOSPITAL_COMMUNITY)
Admission: RE | Admit: 2015-09-07 | Discharge: 2015-09-07 | Disposition: A | Discharge status: Home / Self Care | Payer: BC Managed Care – PPO | Source: Ambulatory Visit | Attending: Endocrinology | Admitting: Endocrinology

## 2015-09-07 DIAGNOSIS — C73 Malignant neoplasm of thyroid gland: Secondary | ICD-10-CM | POA: Diagnosis not present

## 2015-09-07 LAB — HCG, SERUM, QUALITATIVE: Preg, Serum: POSITIVE — AB

## 2015-09-07 MED ORDER — SODIUM IODIDE I 131 CAPSULE
4.0000 | Freq: Once | INTRAVENOUS | Status: AC | PRN
  Administered 2015-09-07: 4 via ORAL

## 2015-09-09 ENCOUNTER — Encounter (HOSPITAL_COMMUNITY)
Admission: RE | Admit: 2015-09-09 | Discharge: 2015-09-09 | Disposition: A | Discharge status: Home / Self Care | Payer: BC Managed Care – PPO | Source: Ambulatory Visit | Attending: Endocrinology | Admitting: Endocrinology

## 2015-09-09 DIAGNOSIS — C73 Malignant neoplasm of thyroid gland: Secondary | ICD-10-CM | POA: Diagnosis not present

## 2015-09-29 ENCOUNTER — Other Ambulatory Visit: Payer: Self-pay

## 2015-09-29 DIAGNOSIS — Z1231 Encounter for screening mammogram for malignant neoplasm of breast: Secondary | ICD-10-CM

## 2015-10-13 ENCOUNTER — Ambulatory Visit
Admission: RE | Admit: 2015-10-13 | Discharge: 2015-10-13 | Disposition: A | Discharge status: Home / Self Care | Payer: BC Managed Care – PPO | Source: Ambulatory Visit

## 2015-10-13 DIAGNOSIS — Z1231 Encounter for screening mammogram for malignant neoplasm of breast: Secondary | ICD-10-CM

## 2016-10-16 ENCOUNTER — Other Ambulatory Visit: Payer: Self-pay | Admitting: Endocrinology

## 2016-10-16 DIAGNOSIS — Z1231 Encounter for screening mammogram for malignant neoplasm of breast: Secondary | ICD-10-CM

## 2016-10-18 ENCOUNTER — Ambulatory Visit
Admission: RE | Admit: 2016-10-18 | Discharge: 2016-10-18 | Disposition: A | Discharge status: Home / Self Care | Payer: BC Managed Care – PPO | Source: Ambulatory Visit | Attending: Endocrinology | Admitting: Endocrinology

## 2016-10-18 DIAGNOSIS — Z1231 Encounter for screening mammogram for malignant neoplasm of breast: Secondary | ICD-10-CM

## 2016-10-18 HISTORY — DX: Personal history of irradiation: Z92.3

## 2016-11-26 ENCOUNTER — Inpatient Hospital Stay (HOSPITAL_COMMUNITY): Payer: BC Managed Care – PPO

## 2016-11-26 ENCOUNTER — Encounter (HOSPITAL_COMMUNITY): Payer: Self-pay

## 2016-11-26 ENCOUNTER — Emergency Department (HOSPITAL_COMMUNITY): Payer: BC Managed Care – PPO

## 2016-11-26 ENCOUNTER — Inpatient Hospital Stay (HOSPITAL_COMMUNITY)
Admission: EM | Admit: 2016-11-26 | Discharge: 2016-12-02 | DRG: 871 | Disposition: A | Discharge status: Home / Self Care | Payer: BC Managed Care – PPO | Attending: Internal Medicine | Admitting: Internal Medicine

## 2016-11-26 DIAGNOSIS — Z6841 Body Mass Index (BMI) 40.0 and over, adult: Secondary | ICD-10-CM

## 2016-11-26 DIAGNOSIS — E872 Acidosis: Secondary | ICD-10-CM | POA: Diagnosis present

## 2016-11-26 DIAGNOSIS — G43909 Migraine, unspecified, not intractable, without status migrainosus: Secondary | ICD-10-CM | POA: Diagnosis present

## 2016-11-26 DIAGNOSIS — E119 Type 2 diabetes mellitus without complications: Secondary | ICD-10-CM | POA: Diagnosis present

## 2016-11-26 DIAGNOSIS — A419 Sepsis, unspecified organism: Principal | ICD-10-CM | POA: Diagnosis present

## 2016-11-26 DIAGNOSIS — F329 Major depressive disorder, single episode, unspecified: Secondary | ICD-10-CM | POA: Diagnosis present

## 2016-11-26 DIAGNOSIS — J9601 Acute respiratory failure with hypoxia: Secondary | ICD-10-CM | POA: Diagnosis present

## 2016-11-26 DIAGNOSIS — Z79899 Other long term (current) drug therapy: Secondary | ICD-10-CM | POA: Diagnosis not present

## 2016-11-26 DIAGNOSIS — J810 Acute pulmonary edema: Secondary | ICD-10-CM | POA: Diagnosis present

## 2016-11-26 DIAGNOSIS — K219 Gastro-esophageal reflux disease without esophagitis: Secondary | ICD-10-CM | POA: Diagnosis not present

## 2016-11-26 DIAGNOSIS — G4733 Obstructive sleep apnea (adult) (pediatric): Secondary | ICD-10-CM | POA: Diagnosis present

## 2016-11-26 DIAGNOSIS — E785 Hyperlipidemia, unspecified: Secondary | ICD-10-CM | POA: Diagnosis present

## 2016-11-26 DIAGNOSIS — N179 Acute kidney failure, unspecified: Secondary | ICD-10-CM | POA: Diagnosis present

## 2016-11-26 DIAGNOSIS — Z9289 Personal history of other medical treatment: Secondary | ICD-10-CM

## 2016-11-26 DIAGNOSIS — Z853 Personal history of malignant neoplasm of breast: Secondary | ICD-10-CM

## 2016-11-26 DIAGNOSIS — E7800 Pure hypercholesterolemia, unspecified: Secondary | ICD-10-CM | POA: Diagnosis present

## 2016-11-26 DIAGNOSIS — E118 Type 2 diabetes mellitus with unspecified complications: Secondary | ICD-10-CM | POA: Diagnosis not present

## 2016-11-26 DIAGNOSIS — R6521 Severe sepsis with septic shock: Secondary | ICD-10-CM | POA: Diagnosis present

## 2016-11-26 DIAGNOSIS — R0603 Acute respiratory distress: Secondary | ICD-10-CM

## 2016-11-26 DIAGNOSIS — Z923 Personal history of irradiation: Secondary | ICD-10-CM

## 2016-11-26 DIAGNOSIS — R0602 Shortness of breath: Secondary | ICD-10-CM | POA: Diagnosis present

## 2016-11-26 DIAGNOSIS — E890 Postprocedural hypothyroidism: Secondary | ICD-10-CM | POA: Diagnosis present

## 2016-11-26 DIAGNOSIS — Z9011 Acquired absence of right breast and nipple: Secondary | ICD-10-CM | POA: Diagnosis not present

## 2016-11-26 DIAGNOSIS — Z90710 Acquired absence of both cervix and uterus: Secondary | ICD-10-CM | POA: Diagnosis not present

## 2016-11-26 DIAGNOSIS — A403 Sepsis due to Streptococcus pneumoniae: Secondary | ICD-10-CM

## 2016-11-26 DIAGNOSIS — Z91013 Allergy to seafood: Secondary | ICD-10-CM

## 2016-11-26 DIAGNOSIS — J181 Lobar pneumonia, unspecified organism: Secondary | ICD-10-CM | POA: Diagnosis not present

## 2016-11-26 DIAGNOSIS — I10 Essential (primary) hypertension: Secondary | ICD-10-CM | POA: Diagnosis present

## 2016-11-26 DIAGNOSIS — E784 Other hyperlipidemia: Secondary | ICD-10-CM

## 2016-11-26 DIAGNOSIS — J9621 Acute and chronic respiratory failure with hypoxia: Secondary | ICD-10-CM | POA: Diagnosis present

## 2016-11-26 DIAGNOSIS — Z452 Encounter for adjustment and management of vascular access device: Secondary | ICD-10-CM

## 2016-11-26 DIAGNOSIS — I517 Cardiomegaly: Secondary | ICD-10-CM | POA: Diagnosis not present

## 2016-11-26 DIAGNOSIS — J189 Pneumonia, unspecified organism: Secondary | ICD-10-CM

## 2016-11-26 DIAGNOSIS — Z85850 Personal history of malignant neoplasm of thyroid: Secondary | ICD-10-CM

## 2016-11-26 DIAGNOSIS — E1129 Type 2 diabetes mellitus with other diabetic kidney complication: Secondary | ICD-10-CM | POA: Diagnosis present

## 2016-11-26 DIAGNOSIS — R809 Proteinuria, unspecified: Secondary | ICD-10-CM | POA: Diagnosis present

## 2016-11-26 DIAGNOSIS — Z5329 Procedure and treatment not carried out because of patient's decision for other reasons: Secondary | ICD-10-CM | POA: Diagnosis present

## 2016-11-26 LAB — BASIC METABOLIC PANEL
Anion gap: 15 (ref 5–15)
Anion gap: 15 (ref 5–15)
BUN: 17 mg/dL (ref 6–20)
BUN: 22 mg/dL — ABNORMAL HIGH (ref 6–20)
CO2: 24 mmol/L (ref 22–32)
CO2: 20 mmol/L — ABNORMAL LOW (ref 22–32)
Calcium: 8.9 mg/dL (ref 8.9–10.3)
Calcium: 7.6 mg/dL — ABNORMAL LOW (ref 8.9–10.3)
Chloride: 95 mmol/L — ABNORMAL LOW (ref 101–111)
Chloride: 99 mmol/L — ABNORMAL LOW (ref 101–111)
Creatinine, Ser: 1.35 mg/dL — ABNORMAL HIGH (ref 0.44–1.00)
Creatinine, Ser: 1.48 mg/dL — ABNORMAL HIGH (ref 0.44–1.00)
GFR calc Af Amer: 49 mL/min — ABNORMAL LOW (ref >60)
GFR calc Af Amer: 44 mL/min — ABNORMAL LOW (ref >60)
GFR calc non Af Amer: 43 mL/min — ABNORMAL LOW (ref >60)
GFR calc non Af Amer: 38 mL/min — ABNORMAL LOW (ref >60)
Glucose, Bld: 291 mg/dL — ABNORMAL HIGH (ref 65–99)
Glucose, Bld: 213 mg/dL — ABNORMAL HIGH (ref 65–99)
Potassium: 3.7 mmol/L (ref 3.5–5.1)
Potassium: 3.4 mmol/L — ABNORMAL LOW (ref 3.5–5.1)
Sodium: 134 mmol/L — ABNORMAL LOW (ref 135–145)
Sodium: 134 mmol/L — ABNORMAL LOW (ref 135–145)

## 2016-11-26 LAB — CBC WITH DIFFERENTIAL/PLATELET
Band Neutrophils: 21 %
Basophils Absolute: 0 10*3/uL (ref 0.0–0.1)
Basophils Absolute: 0 10*3/uL (ref 0.0–0.1)
Basophils Relative: 0 %
Basophils Relative: 0 %
Blasts: 0 %
Eosinophils Absolute: 0 10*3/uL (ref 0.0–0.7)
Eosinophils Absolute: 0 10*3/uL (ref 0.0–0.7)
Eosinophils Relative: 0 %
Eosinophils Relative: 0 %
HCT: 39 % (ref 36.0–46.0)
HCT: 35.3 % — ABNORMAL LOW (ref 36.0–46.0)
Hemoglobin: 12.5 g/dL (ref 12.0–15.0)
Hemoglobin: 11.1 g/dL — ABNORMAL LOW (ref 12.0–15.0)
Lymphocytes Relative: 5 %
Lymphocytes Relative: 8 %
Lymphs Abs: 1 10*3/uL (ref 0.7–4.0)
Lymphs Abs: 1.3 10*3/uL (ref 0.7–4.0)
MCH: 23.2 pg — ABNORMAL LOW (ref 26.0–34.0)
MCH: 23.1 pg — ABNORMAL LOW (ref 26.0–34.0)
MCHC: 32.1 g/dL (ref 30.0–36.0)
MCHC: 31.4 g/dL (ref 30.0–36.0)
MCV: 72.4 fL — ABNORMAL LOW (ref 78.0–100.0)
MCV: 73.4 fL — ABNORMAL LOW (ref 78.0–100.0)
Metamyelocytes Relative: 5 %
Monocytes Absolute: 0.4 10*3/uL (ref 0.1–1.0)
Monocytes Absolute: 0.2 10*3/uL (ref 0.1–1.0)
Monocytes Relative: 2 %
Monocytes Relative: 1 %
Myelocytes: 0 %
Neutro Abs: 18.5 10*3/uL — ABNORMAL HIGH (ref 1.7–7.7)
Neutro Abs: 14.2 10*3/uL — ABNORMAL HIGH (ref 1.7–7.7)
Neutrophils Relative %: 67 %
Neutrophils Relative %: 91 %
Other: 0 %
Platelets: 250 10*3/uL (ref 150–400)
Platelets: 204 10*3/uL (ref 150–400)
Promyelocytes Absolute: 0 %
RBC: 5.39 MIL/uL — ABNORMAL HIGH (ref 3.87–5.11)
RBC: 4.81 MIL/uL (ref 3.87–5.11)
RDW: 17.8 % — ABNORMAL HIGH (ref 11.5–15.5)
RDW: 17.8 % — ABNORMAL HIGH (ref 11.5–15.5)
WBC Morphology: INCREASED
WBC: 19.9 10*3/uL — ABNORMAL HIGH (ref 4.0–10.5)
WBC: 15.7 10*3/uL — ABNORMAL HIGH (ref 4.0–10.5)
nRBC: 0 /100 WBC

## 2016-11-26 LAB — BLOOD GAS, ARTERIAL
Acid-base deficit: 4.3 mmol/L — ABNORMAL HIGH (ref 0.0–2.0)
Acid-base deficit: 5 mmol/L — ABNORMAL HIGH (ref 0.0–2.0)
Bicarbonate: 20.7 mmol/L (ref 20.0–28.0)
Bicarbonate: 19.7 mmol/L — ABNORMAL LOW (ref 20.0–28.0)
Delivery systems: POSITIVE
Drawn by: 398891
Drawn by: 244851
Expiratory PAP: 8
FIO2: 100
FIO2: 100
Inspiratory PAP: 16
O2 Saturation: 90.8 %
O2 Saturation: 98.5 %
Patient temperature: 98.6
Patient temperature: 98.6
RATE: 10 resp/min
pCO2 arterial: 40.6 mmHg (ref 32.0–48.0)
pCO2 arterial: 37.7 mmHg (ref 32.0–48.0)
pH, Arterial: 7.326 — ABNORMAL LOW (ref 7.350–7.450)
pH, Arterial: 7.339 — ABNORMAL LOW (ref 7.350–7.450)
pO2, Arterial: 68.2 mmHg — ABNORMAL LOW (ref 83.0–108.0)
pO2, Arterial: 144 mmHg — ABNORMAL HIGH (ref 83.0–108.0)

## 2016-11-26 LAB — GLUCOSE, CAPILLARY
Glucose-Capillary: 198 mg/dL — ABNORMAL HIGH (ref 65–99)
Glucose-Capillary: 229 mg/dL — ABNORMAL HIGH (ref 65–99)
Glucose-Capillary: 252 mg/dL — ABNORMAL HIGH (ref 65–99)

## 2016-11-26 LAB — PHOSPHORUS: Phosphorus: 3.4 mg/dL (ref 2.5–4.6)

## 2016-11-26 LAB — LACTIC ACID, PLASMA: Lactic Acid, Venous: 4.1 mmol/L (ref 0.5–1.9)

## 2016-11-26 LAB — CORTISOL: Cortisol, Plasma: 52.6 ug/dL

## 2016-11-26 LAB — TROPONIN I: Troponin I: 0.03 ng/mL (ref <0.03)

## 2016-11-26 LAB — MRSA PCR SCREENING: MRSA by PCR: INVALID — AB

## 2016-11-26 LAB — PROCALCITONIN: Procalcitonin: 35.53 ng/mL

## 2016-11-26 LAB — D-DIMER, QUANTITATIVE: D-Dimer, Quant: 0.97 ug/mL-FEU — ABNORMAL HIGH (ref 0.00–0.50)

## 2016-11-26 LAB — I-STAT CG4 LACTIC ACID, ED: Lactic Acid, Venous: 4.02 mmol/L (ref 0.5–1.9)

## 2016-11-26 LAB — BRAIN NATRIURETIC PEPTIDE: B Natriuretic Peptide: 291.9 pg/mL — ABNORMAL HIGH (ref 0.0–100.0)

## 2016-11-26 LAB — PROTIME-INR
INR: 1.21
Prothrombin Time: 15.4 seconds — ABNORMAL HIGH (ref 11.4–15.2)

## 2016-11-26 LAB — I-STAT TROPONIN, ED: Troponin i, poc: 0 ng/mL (ref 0.00–0.08)

## 2016-11-26 LAB — APTT: aPTT: 38 seconds — ABNORMAL HIGH (ref 24–36)

## 2016-11-26 LAB — MAGNESIUM: Magnesium: 1.2 mg/dL — ABNORMAL LOW (ref 1.7–2.4)

## 2016-11-26 MED ORDER — IPRATROPIUM-ALBUTEROL 0.5-2.5 (3) MG/3ML IN SOLN
RESPIRATORY_TRACT | Status: AC
  Administered 2016-11-26: 3 mL via RESPIRATORY_TRACT
  Filled 2016-11-26: qty 3

## 2016-11-26 MED ORDER — DEXTROSE 5 % IV SOLN: 2.0000 g | Freq: Once | INTRAVENOUS | Status: DC

## 2016-11-26 MED ORDER — ACETAMINOPHEN 650 MG RE SUPP: 650.0000 mg | Freq: Four times a day (QID) | RECTAL | Status: DC | PRN

## 2016-11-26 MED ORDER — SODIUM CHLORIDE 0.9 % IV BOLUS (SEPSIS): 1000.0000 mL | Freq: Once | INTRAVENOUS | Status: DC

## 2016-11-26 MED ORDER — MORPHINE SULFATE (PF) 4 MG/ML IV SOLN
4.0000 mg | Freq: Once | INTRAVENOUS | Status: AC
  Administered 2016-11-26: 4 mg via INTRAVENOUS
  Filled 2016-11-26: qty 1

## 2016-11-26 MED ORDER — HEPARIN SODIUM (PORCINE) 5000 UNIT/ML IJ SOLN
5000.0000 [IU] | Freq: Three times a day (TID) | INTRAMUSCULAR | Status: DC
  Administered 2016-11-26 – 2016-12-02 (×16): 5000 [IU] via SUBCUTANEOUS
  Filled 2016-11-26 (×19): qty 1

## 2016-11-26 MED ORDER — SODIUM CHLORIDE 0.9 % IV SOLN
1250.0000 mg | INTRAVENOUS | Status: DC
  Administered 2016-11-27: 1250 mg via INTRAVENOUS
  Filled 2016-11-26: qty 1250

## 2016-11-26 MED ORDER — VANCOMYCIN HCL 10 G IV SOLR: 1250.0000 mg | INTRAVENOUS | Status: DC

## 2016-11-26 MED ORDER — VANCOMYCIN HCL 10 G IV SOLR
2500.0000 mg | Freq: Once | INTRAVENOUS | Status: DC
  Filled 2016-11-26: qty 2500

## 2016-11-26 MED ORDER — SODIUM CHLORIDE 0.9 % IV BOLUS (SEPSIS): 500.0000 mL | Freq: Once | INTRAVENOUS | Status: DC

## 2016-11-26 MED ORDER — CEFTRIAXONE SODIUM 1 G IJ SOLR: 1.0000 g | INTRAMUSCULAR | Status: DC

## 2016-11-26 MED ORDER — HYDROCORTISONE NA SUCCINATE PF 100 MG IJ SOLR
50.0000 mg | Freq: Four times a day (QID) | INTRAMUSCULAR | Status: DC
  Administered 2016-11-26 – 2016-11-27 (×3): 50 mg via INTRAVENOUS
  Filled 2016-11-26: qty 1
  Filled 2016-11-26 (×3): qty 2

## 2016-11-26 MED ORDER — ONDANSETRON HCL 4 MG/2ML IJ SOLN
4.0000 mg | Freq: Once | INTRAMUSCULAR | Status: AC
  Administered 2016-11-26: 4 mg via INTRAVENOUS
  Filled 2016-11-26: qty 2

## 2016-11-26 MED ORDER — DEXTROSE 5 % IV SOLN
1.0000 g | Freq: Three times a day (TID) | INTRAVENOUS | Status: DC
  Administered 2016-11-27 – 2016-11-28 (×4): 1 g via INTRAVENOUS
  Filled 2016-11-26 (×5): qty 1

## 2016-11-26 MED ORDER — SODIUM CHLORIDE 0.9 % IV BOLUS (SEPSIS)
1000.0000 mL | Freq: Once | INTRAVENOUS | Status: AC
  Administered 2016-11-26: 1000 mL via INTRAVENOUS

## 2016-11-26 MED ORDER — ORAL CARE MOUTH RINSE
15.0000 mL | Freq: Two times a day (BID) | OROMUCOSAL | Status: DC
  Administered 2016-11-27 (×2): 15 mL via OROMUCOSAL

## 2016-11-26 MED ORDER — SODIUM CHLORIDE 0.9 % IV BOLUS (SEPSIS)
500.0000 mL | Freq: Once | INTRAVENOUS | Status: AC
  Administered 2016-11-26: 500 mL via INTRAVENOUS

## 2016-11-26 MED ORDER — VANCOMYCIN HCL 10 G IV SOLR
2000.0000 mg | Freq: Once | INTRAVENOUS | Status: AC
  Administered 2016-11-26: 2000 mg via INTRAVENOUS
  Filled 2016-11-26: qty 2000

## 2016-11-26 MED ORDER — PANTOPRAZOLE SODIUM 40 MG PO TBEC: 80.0000 mg | DELAYED_RELEASE_TABLET | Freq: Every day | ORAL | Status: DC

## 2016-11-26 MED ORDER — IPRATROPIUM-ALBUTEROL 0.5-2.5 (3) MG/3ML IN SOLN
3.0000 mL | Freq: Four times a day (QID) | RESPIRATORY_TRACT | Status: DC
  Administered 2016-11-26 – 2016-11-28 (×10): 3 mL via RESPIRATORY_TRACT
  Filled 2016-11-26 (×8): qty 3

## 2016-11-26 MED ORDER — DEXTROSE 5 % IV SOLN
500.0000 mg | Freq: Once | INTRAVENOUS | Status: AC
  Administered 2016-11-26: 500 mg via INTRAVENOUS
  Filled 2016-11-26: qty 500

## 2016-11-26 MED ORDER — MORPHINE SULFATE (PF) 4 MG/ML IV SOLN
2.0000 mg | INTRAVENOUS | Status: DC | PRN
  Filled 2016-11-26: qty 1

## 2016-11-26 MED ORDER — DEXTROSE 5 % IV SOLN: 500.0000 mg | INTRAVENOUS | Status: DC

## 2016-11-26 MED ORDER — LEVOTHYROXINE SODIUM 112 MCG PO TABS
224.0000 ug | ORAL_TABLET | ORAL | Status: DC
  Filled 2016-11-26: qty 2

## 2016-11-26 MED ORDER — WHITE PETROLATUM GEL
Status: AC
  Administered 2016-11-26: 1
  Filled 2016-11-26: qty 1

## 2016-11-26 MED ORDER — ONDANSETRON HCL 4 MG PO TABS
4.0000 mg | ORAL_TABLET | Freq: Four times a day (QID) | ORAL | Status: DC | PRN
  Administered 2016-11-28: 4 mg via ORAL
  Filled 2016-11-26: qty 1

## 2016-11-26 MED ORDER — ACETAMINOPHEN 325 MG PO TABS
650.0000 mg | ORAL_TABLET | Freq: Four times a day (QID) | ORAL | Status: DC | PRN
  Administered 2016-11-27 – 2016-12-02 (×6): 650 mg via ORAL
  Filled 2016-11-26 (×7): qty 2

## 2016-11-26 MED ORDER — DEXTROSE 5 % IV SOLN
1.0000 g | Freq: Three times a day (TID) | INTRAVENOUS | Status: DC
  Filled 2016-11-26 (×2): qty 1

## 2016-11-26 MED ORDER — SODIUM CHLORIDE 0.9% FLUSH
3.0000 mL | Freq: Two times a day (BID) | INTRAVENOUS | Status: DC
  Administered 2016-11-26 – 2016-12-02 (×11): 3 mL via INTRAVENOUS

## 2016-11-26 MED ORDER — NOREPINEPHRINE BITARTRATE 1 MG/ML IV SOLN
0.0000 ug/min | INTRAVENOUS | Status: DC
  Administered 2016-11-26: 5 ug/min via INTRAVENOUS
  Filled 2016-11-26 (×2): qty 4

## 2016-11-26 MED ORDER — FUROSEMIDE 10 MG/ML IJ SOLN
INTRAMUSCULAR | Status: AC
  Filled 2016-11-26: qty 4

## 2016-11-26 MED ORDER — DEXTROSE 5 % IV SOLN
2.0000 g | Freq: Once | INTRAVENOUS | Status: AC
  Administered 2016-11-26: 2 g via INTRAVENOUS
  Filled 2016-11-26: qty 2

## 2016-11-26 MED ORDER — IOPAMIDOL (ISOVUE-370) INJECTION 76%
INTRAVENOUS | Status: AC
  Administered 2016-11-26: 80 mL
  Filled 2016-11-26: qty 100

## 2016-11-26 MED ORDER — DEXTROSE 5 % IV SOLN
500.0000 mg | INTRAVENOUS | Status: DC
  Filled 2016-11-26: qty 500

## 2016-11-26 MED ORDER — DEXTROSE 5 % IV SOLN
500.0000 mg | INTRAVENOUS | Status: DC
  Administered 2016-11-27: 500 mg via INTRAVENOUS
  Filled 2016-11-26 (×2): qty 500

## 2016-11-26 MED ORDER — LEVOTHYROXINE SODIUM 100 MCG IV SOLR
112.0000 ug | Freq: Every day | INTRAVENOUS | Status: DC
  Filled 2016-11-26: qty 10

## 2016-11-26 MED ORDER — DIAZEPAM 5 MG PO TABS: 2.5000 mg | ORAL_TABLET | Freq: Every day | ORAL | Status: DC | PRN

## 2016-11-26 MED ORDER — INSULIN ASPART 100 UNIT/ML ~~LOC~~ SOLN: 0.0000 [IU] | Freq: Three times a day (TID) | SUBCUTANEOUS | Status: DC

## 2016-11-26 MED ORDER — POLYETHYLENE GLYCOL 3350 17 G PO PACK
17.0000 g | PACK | Freq: Every day | ORAL | Status: DC | PRN
  Filled 2016-11-26: qty 1

## 2016-11-26 MED ORDER — VILAZODONE HCL 40 MG PO TABS
40.0000 mg | ORAL_TABLET | Freq: Every day | ORAL | Status: DC
  Filled 2016-11-26: qty 1

## 2016-11-26 MED ORDER — VITAMIN B-12 1000 MCG PO TABS
500.0000 ug | ORAL_TABLET | Freq: Every day | ORAL | Status: DC
  Administered 2016-11-27 – 2016-12-02 (×6): 500 ug via ORAL
  Filled 2016-11-26 (×6): qty 1

## 2016-11-26 MED ORDER — TRAZODONE HCL 100 MG PO TABS
100.0000 mg | ORAL_TABLET | Freq: Every day | ORAL | Status: DC
  Filled 2016-11-26: qty 1

## 2016-11-26 MED ORDER — CHLORHEXIDINE GLUCONATE 0.12 % MT SOLN
15.0000 mL | Freq: Two times a day (BID) | OROMUCOSAL | Status: DC
  Administered 2016-11-26 – 2016-11-28 (×3): 15 mL via OROMUCOSAL

## 2016-11-26 MED ORDER — GUAIFENESIN ER 600 MG PO TB12
1200.0000 mg | ORAL_TABLET | Freq: Two times a day (BID) | ORAL | Status: DC
  Administered 2016-11-27 – 2016-12-02 (×11): 1200 mg via ORAL
  Filled 2016-11-26 (×13): qty 2

## 2016-11-26 MED ORDER — MORPHINE SULFATE (PF) 2 MG/ML IV SOLN
1.0000 mg | INTRAVENOUS | Status: DC | PRN
  Administered 2016-11-26 – 2016-11-27 (×2): 1 mg via INTRAVENOUS
  Administered 2016-11-27 – 2016-11-28 (×4): 2 mg via INTRAVENOUS
  Administered 2016-11-29: 1 mg via INTRAVENOUS
  Filled 2016-11-26 (×7): qty 1

## 2016-11-26 MED ORDER — VANCOMYCIN HCL IN DEXTROSE 1-5 GM/200ML-% IV SOLN: 1000.0000 mg | Freq: Once | INTRAVENOUS | Status: DC

## 2016-11-26 MED ORDER — DEXTROSE 5 % IV SOLN
1.0000 g | Freq: Once | INTRAVENOUS | Status: AC
  Administered 2016-11-26: 1 g via INTRAVENOUS
  Filled 2016-11-26: qty 10

## 2016-11-26 MED ORDER — ONDANSETRON HCL 4 MG/2ML IJ SOLN
4.0000 mg | Freq: Four times a day (QID) | INTRAMUSCULAR | Status: DC | PRN
  Administered 2016-11-27: 4 mg via INTRAVENOUS
  Filled 2016-11-26: qty 2

## 2016-11-26 MED ORDER — VITAMIN D3 25 MCG (1000 UNIT) PO TABS
1000.0000 [IU] | ORAL_TABLET | Freq: Every day | ORAL | Status: DC
  Administered 2016-11-27 – 2016-12-02 (×6): 1000 [IU] via ORAL
  Filled 2016-11-26 (×10): qty 1

## 2016-11-26 NOTE — Procedures (Signed)
Central Venous Catheter Insertion Procedure Note Brittany Rubio [id number] 20-Mar-1961  Procedure: Insertion of Central Venous Catheter Indications: Assessment of intravascular volume and Drug and/or fluid administration  Procedure Details Consent: Risks of procedure as well as the alternatives and risks of each were explained to the (patient/caregiver).  Consent for procedure obtained. Time Out: Verified patient identification, verified procedure, site/side was marked, verified correct patient position, special equipment/implants available, medications/allergies/relevent history reviewed, required imaging and test results available.  Performed  Maximum sterile technique was used including antiseptics, cap, gloves, gown, hand hygiene, mask and sheet. Skin prep: Chlorhexidine; local anesthetic administered A antimicrobial bonded/coated triple lumen catheter was placed in the left internal jugular vein using the Seldinger technique.  Evaluation Blood flow good Complications: No apparent complications Patient did tolerate procedure well. Chest X-ray ordered to verify placement.  CXR: pending.  U/S used in placement.  Tomas Schamp 11/26/2016, 7:09 PM

## 2016-11-26 NOTE — ED Notes (Signed)
Report attempted 

## 2016-11-26 NOTE — Progress Notes (Signed)
Pharmacy Antibiotic Note  Brittany Rubio is a 56 y.o. female admitted on 11/26/2016 with sepsis 2/2 PNA.  Pharmacy has been consulted for vancomycin and cefepime dosing. Patient is afebrile, WBC elevated at 19.9, lactic acid 4.02, normalized CrCl ~ 52 ml/min  Given azithromycin and ceftriaxone in the ED. Vanc and cefepime ordered in the ED but not given yet.  Plan: Give vancomycin 2g IV x 1, then start vancomycin 1,250mg  IV Q24h Give cefepime 2g IV x 1, then start cefepime 1g IV Q8h Monitor clinical picture, renal function, VT prn F/U C&S, abx deescalation / LOT  Height: 5\' 4"  (162.6 cm) Weight: 248 lb 9.6 oz (112.8 kg) IBW/kg (Calculated) : 54.7  Temp (24hrs), Avg:98.5 F (36.9 C), Min:97.8 F (36.6 C), Max:99 F (37.2 C)   Recent Labs Lab 11/26/16 1008 11/26/16 1234  WBC 19.9*  --   CREATININE 1.35*  --   LATICACIDVEN  --  4.02*    Estimated Creatinine Clearance: 56.5 mL/min (A) (by C-G formula based on SCr of 1.35 mg/dL (H)).    Allergies  Allergen Reactions  . Iodine     Other reaction(s): Hives/Skin Rash  . Shellfish Allergy Swelling    Antimicrobials this admission: 5/14 vanc>> 5/14 cefepime>>  Dose adjustments this admission: n/a  Microbiology results: 5/14 BCx: sent  Thank you for allowing pharmacy to be a part of this patient's care.  Elenor Quinones, PharmD, BCPS Clinical Pharmacist Pager [phone] 11/26/2016 6:41 PM

## 2016-11-26 NOTE — Progress Notes (Signed)
Pharmacy Antibiotic Note  Brittany Rubio is a 56 y.o. female admitted on 11/26/2016 with sepsis 2/2 PNA.  Pharmacy has been consulted for vancomycin and cefepime dosing. Patient is afebrile, WBC elevated at 19.9, lactic acid 4.02, normalized CrCl ~ 52 ml/min  Plan: Vancomycin 2500 mg x1 then 1250 mg IV every 24 hours.  Goal trough 15-20 mcg/mL.  Cefepime 2g x1 then 1g IV Q8H Monitor renal function, cultures, ability to de-escalate   Height: 5\' 4"  (162.6 cm) Weight: 242 lb (109.8 kg) IBW/kg (Calculated) : 54.7  Temp (24hrs), Avg:99 F (37.2 C), Min:99 F (37.2 C), Max:99 F (37.2 C)   Recent Labs Lab 11/26/16 1008 11/26/16 1234  WBC 19.9*  --   CREATININE 1.35*  --   LATICACIDVEN  --  4.02*    Estimated Creatinine Clearance: 55.7 mL/min (A) (by C-G formula based on SCr of 1.35 mg/dL (H)).    Allergies  Allergen Reactions  . Iodine     Other reaction(s): Hives/Skin Rash  . Shellfish Allergy Swelling    Antimicrobials this admission: 5/14 vanc>> 5/14 cefepime>>  Dose adjustments this admission: n/a  Microbiology results: 5/14 BCx: sent  Thank you for allowing pharmacy to be a part of this patient's care.   Gwenlyn Perking, PharmD PGY1 Pharmacy Resident Rx ED [phone] 11/26/2016 4:55 PM

## 2016-11-26 NOTE — Progress Notes (Signed)
PCCM Interval Progress Note  Left IJ CVL placed, CXR shows line projecting over left heart with a loop around aortic notch.  Blood aspirated is very dark, certainly appears venous.  ABG c/w venous (pO2 37).  After placement, CVL would intermittently malfunction where certain ports would randomly not draw back blood and / or flush; however, on 2nd or 3rd attempt, would work normally.  Given urgent need for levophed and worsening BP, decision made to place new right sided IJ CVL given intermittent malfunction of current left Iine.  Discussed with pt's husband and son who were in agreement.  New right IJ CVL placed under US guidance.  CXR pending.   Montey Hora, [place] [facility] Pager: [phone]  or [phone] 11/26/2016, 7:59 PM

## 2016-11-26 NOTE — Consult Note (Signed)
PULMONARY / CRITICAL CARE MEDICINE   Name: Brittany Rubio MRN: [id number] DOB: [birth year]    ADMISSION DATE:  11/26/2016 CONSULTATION DATE:  11/26/2016  REFERRING MD:  Dr. Marily Memos  CHIEF COMPLAINT:  Septic Shock   HISTORY OF PRESENT ILLNESS:   56 year old female with PMH as below who was admitted on 5/14 with sepsis most likely related to CAP.  She presented to the ER with complaints of progressive shortness of breath and chest pain that started the night before.  She was found to be tachycardic at 122, tachypneic in the 40's, temp 99, and blood pressure initially normal but became hypotensive to 80/66 and hypoxic around 80% on room air.  WBC 19,900, Na 134, Cl 95, sCr 1.35, lactic acid 4.02, and D-dimer 0.97.  EKG showed sinus tachycardia.  Chest xray showed right upper lobe consolidation and small bilateral pleural effusions.  Chest CT showed right upper lobe pneumonia with significant consolidation and some patchy infiltrate in the right lower lobe; no pulmonary emboli.  She was started on Zithromax and cefepime.  She was admitted to [facility] and placed in stepdown.  While there, she was placed on BiPAP for worsening shortness of breath and increasing work of breathing.  Additionally, she became hypotensive, not fluid responsive.  PCCM was consulted and she was moved to the ICU for septic shock.  PAST MEDICAL HISTORY :  She  has a past medical history of Arthritis; Breast cancer ([place]); GERD (gastroesophageal reflux disease); High cholesterol; Hypertension (11/26/2011); Migraines; Multinodular thyroid (2015); OSA on CPAP; Personal history of radiation therapy; and Type II diabetes mellitus ([place]).  PAST SURGICAL HISTORY: She  has a past surgical history that includes Diaphragm surgery (1986); Abdominal hysterectomy (? 1997); Partial mastectomy with needle localization (08/12/2012); Thyroidectomy, partial (Left, 07/07/2014); Reduction mammaplasty (Bilateral, 1995); Breast lumpectomy (Left, 2009);  Breast lumpectomy (Right, 07/2012); Thyroidectomy (Left, 07/07/2014); Thyroidectomy (Right, 07/08/2014); and Breast excisional biopsy (Right, 2014).  Allergies  Allergen Reactions  . Iodine     Other reaction(s): Hives/Skin Rash  . Shellfish Allergy Swelling    No current facility-administered medications on file prior to encounter.    Current Outpatient Prescriptions on File Prior to Encounter  Medication Sig  . amphetamine-dextroamphetamine (ADDERALL) 20 MG tablet Take 20 mg by mouth daily.   . butorphanol (STADOL) 10 MG/ML nasal spray Place 1 spray into the nose every 4 (four) hours as needed. For migraines  . Dapagliflozin Propanediol (FARXIGA) 10 MG TABS Take 10 mg by mouth daily.  Marland Kitchen diltiazem (MATZIM LA) 240 MG 24 hr tablet Take 240 mg by mouth at bedtime.   Marland Kitchen esomeprazole (NEXIUM) 20 MG capsule Take 20 mg by mouth daily at 12 noon.  Marland Kitchen ibuprofen (ADVIL,MOTRIN) 800 MG tablet Take 1 tablet (800 mg total) by mouth 3 (three) times daily. (Patient taking differently: Take 800 mg by mouth 2 (two) times daily as needed for mild pain. )  . lisinopril-hydrochlorothiazide (PRINZIDE,ZESTORETIC) 20-25 MG tablet Take 1 tablet by mouth daily.   Marland Kitchen LIVALO 4 MG TABS Take 4 mg by mouth daily.   . traZODone (DESYREL) 50 MG tablet Take 100 mg by mouth at bedtime.     FAMILY HISTORY:  Her indicated that her mother is deceased. She indicated that her father is deceased.    SOCIAL HISTORY: She  reports that she has never smoked. She has never used smokeless tobacco. She reports that she drinks alcohol. She reports that she does not use drugs.  REVIEW OF SYSTEMS:  Unable to assess    VITAL SIGNS: BP 102/66 (BP Location: Left Arm)   Pulse (!) 136   Temp 98.8 F (37.1 C) (Oral)   Resp (!) 46   Ht 5\' 4"  (1.626 m)   Wt 248 lb 9.6 oz (112.8 kg)   SpO2 96%   BMI 42.67 kg/m   HEMODYNAMICS:    VENTILATOR SETTINGS: FiO2 (%):  [100 %] 100 %  INTAKE / OUTPUT: No intake/output data  recorded.  PHYSICAL EXAMINATION: General:  Adult female, sitting in bed in mild distress HEENT: MM pink/dry, scleral anicteric  Neuro: Awake, follows commands, non focal CV: s1s2 rrr, no m/r/g, hypotensive SBP 80's PULM: even/mildly-labored on BiPAP, Diffuse crackles in right lobe GI: obese, soft, non-tender, bsx4 active  Extremities: warm/dry, no edema  Skin: no rashes or lesions   LABS:  BMET  Recent Labs Lab 11/26/16 1008  NA 134*  K 3.7  CL 95*  CO2 24  BUN 17  CREATININE 1.35*  GLUCOSE 291*    Electrolytes  Recent Labs Lab 11/26/16 1008  CALCIUM 8.9    CBC  Recent Labs Lab 11/26/16 1008  WBC 19.9*  HGB 12.5  HCT 39.0  PLT 250    Coag's No results for input(s): APTT, INR in the last 168 hours.  Sepsis Markers  Recent Labs Lab 11/26/16 1234  LATICACIDVEN 4.02*    ABG  Recent Labs Lab 11/26/16 1742  PHART 7.326*  PCO2ART 40.6  PO2ART 68.2*    Liver Enzymes No results for input(s): AST, ALT, ALKPHOS, BILITOT, ALBUMIN in the last 168 hours.  Cardiac Enzymes No results for input(s): TROPONINI, PROBNP in the last 168 hours.  Glucose  Recent Labs Lab 11/26/16 1823  GLUCAP 198*    Imaging Dg Chest 2 View  Result Date: 11/26/2016 CLINICAL DATA:  Chest pain and shortness of breath EXAM: CHEST  2 VIEW COMPARISON:  03/11/2015 FINDINGS: Normal heart size. Small pleural effusions suspected. Airspace consolidation within the right upper lobe is identified. Left lung appears clear. IMPRESSION: 1. Right upper lobe airspace consolidation is identified. In the acute setting this is compatible with pneumonia. Followup PA and lateral chest X-ray is recommended in 3-4 weeks following trial of antibiotic therapy to ensure resolution and exclude underlying malignancy. 2. Small pleural effusions. Electronically Signed   By: Kerby Moors M.D.   On: 11/26/2016 10:44   Ct Angio Chest Pe W Or Wo Contrast  Result Date: 11/26/2016 CLINICAL DATA:   Shortness of breath and right-sided chest pain EXAM: CT ANGIOGRAPHY CHEST WITH CONTRAST TECHNIQUE: Multidetector CT imaging of the chest was performed using the standard protocol during bolus administration of intravenous contrast. Multiplanar CT image reconstructions and MIPs were obtained to evaluate the vascular anatomy. CONTRAST:  80 mL Isovue 370. COMPARISON:  None. FINDINGS: Cardiovascular: The thoracic aorta is well visualized without evidence of aneurysmal dilatation or dissection. The pulmonary artery shows a normal branching pattern. No filling defects to suggest pulmonary emboli are identified. Mediastinum/Nodes: The thoracic inlet is within normal limits. Scattered small mediastinal lymph nodes are identified. No significant adenopathy is seen. Lungs/Pleura: Left lung is well aerated without focal infiltrate or sizable effusion. The right lung demonstrates considerable consolidation in the right upper lobe similar to that seen on recent plain film examination consistent with acute pneumonia. Additionally some patchy changes are noted in the right lower lobe. No significant effusion or pneumothorax is seen. Upper Abdomen: Fatty infiltration of the liver is noted. Musculoskeletal: Degenerative changes of the thoracic spine  are seen. Review of the MIP images confirms the above findings. IMPRESSION: Right upper lobe pneumonia with significant consolidation. Some patchy infiltrate is also noted in the right lower lobe. No evidence of pulmonary emboli. Electronically Signed   By: Inez Catalina M.D.   On: 11/26/2016 13:22   Dg Chest Port 1 View  Result Date: 11/26/2016 CLINICAL DATA:  Respiratory distress. Hx of breast cancer, GERD, hypertension, migraines, diabetes, OSA on CPAP, diaphragm surgery(1986)(trauma). Nonsmoker. EXAM: PORTABLE CHEST 1 VIEW COMPARISON:  11/26/2016 at 1552 hours FINDINGS: As noted on the earlier exam, there is right mid to lower lung zone consolidation, sparing the right apex. This  is without significant change from the prior exam allowing for differences in patient positioning and technique. Left lung remains clear. Cardiac silhouette is normal in size. No definite pleural effusion.  No pneumothorax. IMPRESSION: 1. No significant change from the earlier exam. 2. Extensive right lung consolidation consistent with pneumonia. Electronically Signed   By: Lajean Manes M.D.   On: 11/26/2016 17:59   Dg Chest Port 1 View  Result Date: 11/26/2016 CLINICAL DATA:  56 year old female with history of shortness of breath and right-sided chest pain, worsening this morning with some associated shortness of breath. EXAM: PORTABLE CHEST 1 VIEW COMPARISON:  Chest x-ray 11/26/2016. FINDINGS: Significantly worsening airspace consolidation throughout the entire right lung, concerning for progressively worsening multilobar pneumonia. Probable small right pleural effusion. Left lung is clear. No evidence of pulmonary edema. Heart size is borderline enlarged. Upper mediastinal contours are within normal limits. IMPRESSION: 1. Worsening multilobar pneumonia in the right lung involving at least the right upper and right lower lobes. 2. Probable small right parapneumonic pleural effusion. Electronically Signed   By: Vinnie Langton M.D.   On: 11/26/2016 16:14    STUDIES:  Chest CTA 5/14 >> RUL pneumonia with significant consolidation, patchy RLL inflitrate, neg for PE ECHO >>  CULTURES: MRSA 5/14 >> [place] 5/14 >> [facility] 5/14 >>  ANTIBIOTICS: ceftriaxiane 5/14 >>5/14 Cefepime 5/14 >>  Azithromycin 5/14 >> Vancomycin 5/14 >>  SIGNIFICANT EVENTS: 5/14 Admit with sepsis and CAP  LINES/TUBES: PIV   DISCUSSION:  35 yof with septic shock 2/2 right CAP on BiPAP.  Moved to ICU for progressive hypotension and respiratory distress  ASSESSMENT / PLAN:  PULMONARY A: Acute hypoxic respiratory failure Right CAP  Pulmonary edema  P:   Continue BiPAP  Wean FiO2 for sats > 94% Continue abx as below, add  vanc ABG now Trend CXR as needed  Duonebs prn  CARDIOVASCULAR A:  Septic shock -  Hx HTN, HLD P:  ICU monitoring Goal map > 65 Place [place] for better BP assessment Place CVL now Start levophed  Trend CVP  Trend troponin's Assess ECHO  Holding home HTN, HLD meds   RENAL A:   AKI-  Lactic acidosis -s/p 3l ? Hx of CKD- normal sCr previously P:   Hold further IVF in the setting of pulmonary edema Trend BMP, mag, phos Trend urinary output Replace electrolytes as indicated Avoid nephrotoxic agents, ensure adequate renal perfusion   GASTROINTESTINAL A:   GERD P:   Protonix for SUP NPO for now  Assess coags  HEMATOLOGIC A:   Leukocytosis  P:  Heparin for DVT prophylaxis   INFECTIOUS A:   CAP Leukocytosis  P:   Continue zithromax and  Add vancomycin Follow cultures Trend PCT Trend lactate  Assess urine strep antigen Assess HIV   ENDOCRINE A:   DM type II Hypothyroidism s/p Thyroid  carcinoma P:   SSI Check cortisol CBG q 4hr Continue synthroid    NEUROLOGIC A:   No acute issues  Hx depression P:   Minimize sedation Continue viibryd Hold trazadone   FAMILY  - Updates: Pts husband updated at bedside by Dr. Nelda Marseille  - Inter-disciplinary family meet or Palliative Care meeting due by:  12/03/16   Kennieth Rad, AGACNP-BC [place] Pgr: [phone] or if no answer [phone] 11/26/2016, 7:15 PM  Attending Note:  56 year old with morbid obesity and diabetes who presents to the hospital with right sided back and chest pain that was noted to have a right sided PNA and BP started dropping.  Patient was given 3L NS so far but patient started developing respiratory failure with repeat CXR revealing pulmonary edema.  Will d/c IVF, if BP is truly low then will need to start pressors.  Hold lasix.  2D echo ordered.  BNP ordered.  BiPAP for respiratory support.  Broaden coverage to vanc/cefepime/zithromax.  Pan culture and f/u.   Check cortisol level.  Stress dose steroid ordered.  Will transfer to the ICU.  Patient is in evolving respiratory failure but given size and comorbids will attempt BiPAP first and monitor.  Repeat ABG ordered after placement of an a-line.  The patient is critically ill with multiple organ systems failure and requires high complexity decision making for assessment and support, frequent evaluation and titration of therapies, application of advanced monitoring technologies and extensive interpretation of multiple databases.   Critical Care Time devoted to patient care services described in this note is  45  Minutes. This time reflects time of care of this signee Dr Jennet Maduro. This critical care time does not reflect procedure time, or teaching time or supervisory time of PA/NP/Med student/Med Resident etc but could involve care discussion time.  Rush Farmer, M.D. [facility] Pulmonary/Critical Care Medicine. Pager: [phone]. After hours pager: [phone].

## 2016-11-26 NOTE — Progress Notes (Signed)
RT called to patient room due to patient in respiratory distress.  Upon arrival, patient was on non-rebreather mask, had accessory muscles, elevated heart rate, and decreased sats.  Per MD, placed patient on bipap.  ABG was obtained prior to placing on bipap.  While on bipap, sats improved to 96% and work of breathing slightly improved.  Duoneb treatment also given.  Patient was transported from 3W18 to room 5Y51 without complications.  Will continue to monitor.

## 2016-11-26 NOTE — Progress Notes (Signed)
CRITICAL VALUE ALERT  Critical value received:  Lactic acid-4.1  Date of notification:  11/26/2016  Time of notification:  1946  Critical value read back:Yes.    Nurse who received alert:  Dillard Essex  MD notified (1st page):  Montey Hora, [place]  Time of first page:  1946  MD notified (2nd page):  Time of second page:  Responding MD:  Montey Hora, PA  Time MD responded:  [phone]

## 2016-11-26 NOTE — Progress Notes (Signed)
Received patient from ED.  Patient with increased respirations to 40's, Oxygen saturations 70% 2LNC, using accessory muscles and gurgly.  Heart rate in 130s.  Oxygen increased to 6L with sats only up to 80. Respiratory Therapy called to assist. 100% nonrebreather placed on patient with sats increasing to 89%.  Dr. Marily Memos paged and notified, new orders received.  Rapid Response called to assist.  Dr. Marily Memos paged to come see patient at bedside.  Patient transferred to [facility] ICU.  Sanda Linger

## 2016-11-26 NOTE — ED Provider Notes (Signed)
[place] DEPT Provider Note   CSN: [id number] Arrival date & time: 11/26/16  0920     History   Chief Complaint Chief Complaint  Patient presents with  . Shortness of Breath    HPI Brittany Rubio is a 56 y.o. female.  HPI   Pt is a 56 yo female with PMH of HTN, GERD, HLD, DM, breast cancer (left, s/p surgery and radiation 5 years ago) and thyroid cancer (s/p surgery 2 years ago) who presents to the ED with SOB and CP. Pt reports last night beginning to have sharp pain to her right shoulder blade that intermittently radiates to her right anterior chest. Reports pain worsened this morning with associated SOB. She states she feels SOB bc her pain worsens when she tries to take a deep breath. Denies fever, chills, HA, dizziness, cough, wheezing, palpitations, diaphoresis, abdominal pain, N/V, numbness, weakness, leg swelling. Denies hx of recent surgery/immobilization, recent long car ride or airplane travel, hormone use, hx of DVT/PE. Denies personal or family hx of CAD. Denies smoking.   PCP- Dr. Baird Cancer  Past Medical History:  Diagnosis Date  . Arthritis    "left shoulder" (07/07/2014)  . Breast cancer ([place])    "left"  . GERD (gastroesophageal reflux disease)    "takes over the counters as needed"  . High cholesterol   . Hypertension 11/26/2011   sees Dr. Bryon Lions  . Migraines    "maybe once q 3 months" (07/07/2014)  . Multinodular thyroid 2015  . OSA on CPAP   . Personal history of radiation therapy   . Type II diabetes mellitus [facility])     Patient Active Problem List   Diagnosis Date Noted  . S/P partial thyroidectomy 07/07/2014  . 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 Hypertension 11/26/2011  . Diabetes mellitus ([place]) 11/26/2011  . Migraines 11/26/2011  . Breast cancer ([place]) 07/05/2011    Past Surgical History:  Procedure Laterality Date  . ABDOMINAL HYSTERECTOMY  ? 1997  . BREAST EXCISIONAL BIOPSY Right 2014  . BREAST LUMPECTOMY Left 2009   . BREAST LUMPECTOMY Right 07/2012   "not a mastectomy"  . [place]   "trauma"  . PARTIAL MASTECTOMY WITH NEEDLE LOCALIZATION  08/12/2012   Procedure: PARTIAL MASTECTOMY WITH NEEDLE LOCALIZATION;  Surgeon: Marcello Moores A. Cornett, MD;  Location: [place];  Service: General;  Laterality: Right;  . REDUCTION MAMMAPLASTY Bilateral 1995  . THYROIDECTOMY Left 07/07/2014   Procedure: LEFT HEMI-THYROIDECTOMY;  Surgeon: Ascencion Dike, MD;  Location: [place];  Service: ENT;  Laterality: Left;  . THYROIDECTOMY Right 07/08/2014   Procedure: Completion THYROIDECTOMY;  Surgeon: Ascencion Dike, MD;  Location: [place];  Service: ENT;  Laterality: Right;  . THYROIDECTOMY, PARTIAL Left 07/07/2014   hemi    OB History    No data available       Home Medications    Prior to Admission medications   Medication Sig Start Date End Date Taking? Authorizing Provider  amphetamine-dextroamphetamine (ADDERALL) 20 MG tablet Take 20 mg by mouth daily.  08/26/12   [provider]  butorphanol (STADOL) 10 MG/ML nasal spray Place 1 spray into the nose every 4 (four) hours as needed. For migraines    [provider]  calcium carbonate (OS-CAL - DOSED IN MG OF ELEMENTAL CALCIUM) 1250 MG tablet Take 1 tablet (500 mg of elemental calcium total) by mouth 2 (two) times daily with a meal. 07/10/14   Leta Baptist, MD  cyclobenzaprine (FLEXERIL) 5  MG tablet Take 1 tablet (5 mg total) by mouth 3 (three) times daily as needed for muscle spasms. 10/06/14   Margarita Mail, PA-C  Dapagliflozin Propanediol (FARXIGA) 10 MG TABS Take 10 mg by mouth daily.    [provider]  diltiazem (MATZIM LA) 240 MG 24 hr tablet Take 240 mg by mouth daily.    Glendale Chard, MD  esomeprazole (NEXIUM) 20 MG capsule Take 20 mg by mouth daily at 12 noon.    [provider]  HYDROcodone-acetaminophen (NORCO/VICODIN) 5-325 MG per tablet Take 1-2 tablets by mouth every 6 (six) hours as needed for moderate pain or severe  pain. Patient not taking: Reported on 06/24/2014 05/15/14   Muthersbaugh, Jarrett Soho, PA-C  ibuprofen (ADVIL,MOTRIN) 800 MG tablet Take 1 tablet (800 mg total) by mouth 3 (three) times daily. Patient not taking: Reported on 06/24/2014 05/15/14   Muthersbaugh, Jarrett Soho, PA-C  levocetirizine (XYZAL) 5 MG tablet Take 5 mg by mouth every evening. As needed for allergies    [provider]  levothyroxine (SYNTHROID) 100 MCG tablet Take 1 tablet (100 mcg total) by mouth daily before breakfast. 07/10/14   Leta Baptist, MD  lidocaine (LIDODERM) 5 % Place 1 patch onto the skin daily. Remove & Discard patch within 12 hours or as directed by MD 10/06/14   Margarita Mail, PA-C  lisinopril-hydrochlorothiazide (PRINZIDE,ZESTORETIC) 20-12.5 MG per tablet Take 1 tablet by mouth daily.     [provider]  LIVALO 4 MG TABS Take 4 mg by mouth daily.  06/14/14   [provider]  naproxen (NAPROSYN) 500 MG tablet Take 1 tablet (500 mg total) by mouth 2 (two) times daily. 10/06/14   Margarita Mail, PA-C  oxyCODONE-acetaminophen (PERCOCET/ROXICET) 5-325 MG per tablet Take 1 tablet by mouth every 4 (four) hours as needed for severe pain. 03/11/15   Gloriann Loan, PA-C  Saxagliptin-Metformin (KOMBIGLYZE XR) 2.11-998 MG TB24 Take 2 tablets by mouth daily.     Glendale Chard, MD  traZODone (DESYREL) 50 MG tablet Take 50 mg by mouth at bedtime. 06/15/14   [provider]    Family History Family History  Problem Relation Age of Onset  . Cancer Mother        Pancreatic    Social History Social History  Substance Use Topics  . Smoking status: Never Smoker  . Smokeless tobacco: Never Used  . Alcohol use Yes     Comment: 07/07/2014 "a few times/year; weddings, anniversary, etc"     Allergies   Shellfish allergy   Review of Systems Review of Systems  Respiratory: Positive for shortness of breath.   Cardiovascular: Positive for chest pain.  Musculoskeletal: Positive for back pain.  All  other systems reviewed and are negative.    Physical Exam Updated Vital Signs BP 96/69   Pulse (!) 119   Temp 99 F (37.2 C) (Oral)   Resp (!) 22   SpO2 (!) 88%   Physical Exam  Constitutional: She is oriented to person, place, and time. She appears well-developed and well-nourished.  HENT:  Head: Normocephalic and atraumatic.  Mouth/Throat: Oropharynx is clear and moist. No oropharyngeal exudate.  Eyes: Conjunctivae and EOM are normal. Right eye exhibits no discharge. Left eye exhibits no discharge. No scleral icterus.  Neck: Normal range of motion. Neck supple.  Cardiovascular: Regular rhythm, normal heart sounds and intact distal pulses.   Tachycardic, HR 117  Pulmonary/Chest: She has no wheezes. She has rales (right sided). She exhibits no tenderness.  Pt mildly tachypneic  on exam, RR 28  Abdominal: Soft. Bowel sounds are normal. She exhibits no distension and no mass. There is no tenderness. There is no rebound and no guarding. No hernia.  Musculoskeletal: Normal range of motion. She exhibits tenderness. She exhibits no edema or deformity.  No midline C, T, or L tenderness. Mild TTP over right upper thoracic back around scapula. Full range of motion of neck and back. Full range of motion of bilateral upper and lower extremities, with 5/5 strength. Sensation intact. 2+ radial and PT pulses. Cap refill <2 seconds.   Neurological: She is alert and oriented to person, place, and time.  Skin: Skin is warm and dry. She is not diaphoretic.  Nursing note and vitals reviewed.    ED Treatments / Results  Labs (all labs ordered are listed, but only abnormal results are displayed) Labs Reviewed  CBC WITH DIFFERENTIAL/PLATELET - Abnormal; Notable for the following:       Result Value   WBC 19.9 (*)    RBC 5.39 (*)    MCV 72.4 (*)    MCH 23.2 (*)    RDW 17.8 (*)    Neutro Abs 18.5 (*)    All other components within normal limits  BASIC METABOLIC PANEL - Abnormal; Notable for the  following:    Sodium 134 (*)    Chloride 95 (*)    Glucose, Bld 291 (*)    Creatinine, Ser 1.35 (*)    GFR calc non Af Amer 43 (*)    GFR calc Af Amer 49 (*)    All other components within normal limits  D-DIMER, QUANTITATIVE (NOT AT [facility]) - Abnormal; Notable for the following:    D-Dimer, Quant 0.97 (*)    All other components within normal limits  I-STAT CG4 LACTIC ACID, ED - Abnormal; Notable for the following:    Lactic Acid, Venous 4.02 (*)    All other components within normal limits  CULTURE, BLOOD (ROUTINE X 2)  CULTURE, BLOOD (ROUTINE X 2)  I-STAT TROPOININ, ED  I-STAT CG4 LACTIC ACID, ED    EKG  EKG Interpretation None       Radiology Dg Chest 2 View  Result Date: 11/26/2016 CLINICAL DATA:  Chest pain and shortness of breath EXAM: CHEST  2 VIEW COMPARISON:  03/11/2015 FINDINGS: Normal heart size. Small pleural effusions suspected. Airspace consolidation within the right upper lobe is identified. Left lung appears clear. IMPRESSION: 1. Right upper lobe airspace consolidation is identified. In the acute setting this is compatible with pneumonia. Followup PA and lateral chest X-ray is recommended in 3-4 weeks following trial of antibiotic therapy to ensure resolution and exclude underlying malignancy. 2. Small pleural effusions. Electronically Signed   By: Kerby Moors M.D.   On: 11/26/2016 10:44   Ct Angio Chest Pe W Or Wo Contrast  Result Date: 11/26/2016 CLINICAL DATA:  Shortness of breath and right-sided chest pain EXAM: CT ANGIOGRAPHY CHEST WITH CONTRAST TECHNIQUE: Multidetector CT imaging of the chest was performed using the standard protocol during bolus administration of intravenous contrast. Multiplanar CT image reconstructions and MIPs were obtained to evaluate the vascular anatomy. CONTRAST:  80 mL Isovue 370. COMPARISON:  None. FINDINGS: Cardiovascular: The thoracic aorta is well visualized without evidence of aneurysmal dilatation or dissection. The pulmonary  artery shows a normal branching pattern. No filling defects to suggest pulmonary emboli are identified. Mediastinum/Nodes: The thoracic inlet is within normal limits. Scattered small mediastinal lymph nodes are identified. No significant adenopathy is seen. Lungs/Pleura: Left  lung is well aerated without focal infiltrate or sizable effusion. The right lung demonstrates considerable consolidation in the right upper lobe similar to that seen on recent plain film examination consistent with acute pneumonia. Additionally some patchy changes are noted in the right lower lobe. No significant effusion or pneumothorax is seen. Upper Abdomen: Fatty infiltration of the liver is noted. Musculoskeletal: Degenerative changes of the thoracic spine are seen. Review of the MIP images confirms the above findings. IMPRESSION: Right upper lobe pneumonia with significant consolidation. Some patchy infiltrate is also noted in the right lower lobe. No evidence of pulmonary emboli. Electronically Signed   By: Inez Catalina M.D.   On: 11/26/2016 13:22    Procedures .Critical Care Performed by: Nona Dell Authorized by: Nona Dell   Critical care provider statement:    Critical care time (minutes):  40   Critical care was necessary to treat or prevent imminent or life-threatening deterioration of the following conditions: Severe sepsis 2/2 CAP.   Critical care was time spent personally by me on the following activities:  Blood draw for specimens, development of treatment plan with patient or surrogate, discussions with consultants, evaluation of patient's response to treatment, examination of patient, interpretation of cardiac output measurements, obtaining history from patient or surrogate, ordering and performing treatments and interventions, ordering and review of laboratory studies, ordering and review of radiographic studies, pulse oximetry, re-evaluation of patient's condition and review of old  charts   (including critical care time)  Medications Ordered in ED Medications  azithromycin (ZITHROMAX) 500 mg in dextrose 5 % 250 mL IVPB (not administered)  cefTRIAXone (ROCEPHIN) 1 g in dextrose 5 % 50 mL IVPB (not administered)  sodium chloride 0.9 % bolus 1,000 mL (0 mLs Intravenous Stopped 11/26/16 1405)    And  sodium chloride 0.9 % bolus 1,000 mL (1,000 mLs Intravenous New Bag/Given 11/26/16 1408)    And  sodium chloride 0.9 % bolus 1,000 mL (not administered)    And  sodium chloride 0.9 % bolus 500 mL (not administered)  morphine 4 MG/ML injection 4 mg (4 mg Intravenous Given 11/26/16 1053)  cefTRIAXone (ROCEPHIN) 1 g in dextrose 5 % 50 mL IVPB (0 g Intravenous Stopped 11/26/16 1327)  azithromycin (ZITHROMAX) 500 mg in dextrose 5 % 250 mL IVPB (500 mg Intravenous New Bag/Given 11/26/16 1329)  iopamidol (ISOVUE-370) 76 % injection (80 mLs  Contrast Given 11/26/16 1227)  morphine 4 MG/ML injection 4 mg (4 mg Intravenous Given 11/26/16 1329)  ondansetron (ZOFRAN) injection 4 mg (4 mg Intravenous Given 11/26/16 1403)     Initial Impression / Assessment and Plan / ED Course  I have reviewed the triage vital signs and the nursing notes.  Pertinent labs & imaging results that were available during my care of the patient were reviewed by me and considered in my medical decision making (see chart for details).    Pt presents with right upper back pain that started yesterday with associated SOB which worsened this morning. Denies personal or family hx of CAD. Denies fever or cough. Initial vitals showed HR 117, RR 28, O2 sat 90% on RA; pt placed on 2L  with improvement to 97%. Exam showed mild TTP over right upper thoracic back around scapula, no midline spinal tenderness. Lungs CTAB. Remaining exam unremarkable. Exam showed sinus tachycardia with no acute ischemic changes. Trop negative. WBC 19.9. CXR showed RUL consolidation. Orders placed for lactic acid and blood cultures prior to  administration of abx (rocephin and azithro).  D-dimer 0.97. Will order CT angio chest to evaluation for PE as pt with tachycardia, tachypnea, afebrile and without a cough on initial presentation. CT angio negative for PE, confirmed RUL pneumonia with significant consolidation. Lactic acid 4.02. Orders placed for 30mg /kg IVF.   Sepsis - Repeat Assessment  Performed at:    2:00pm  Vitals     Blood pressure 96/69, pulse (!) 119, temperature 99 F (37.2 C), temperature source Oral, resp. rate (!) 22, SpO2 (!) 88 %.  Heart:     Tachycardic  Lungs:    Rales  Capillary Refill:   <2 sec  Peripheral Pulse:   Radial pulse palpable  Skin:     Normal Color and Dry  On reevaluation, pt O2 sat remaining 90-91% on RA, pt kept on 2L [place] with improvement of O2 sat to 98%. Discussed results and plan for admission with pt and family. Consulted hospitalist, York Grice PA-C agrees to admission.   Final Clinical Impressions(s) / ED Diagnoses   Final diagnoses:  SOB (shortness of breath)  Community acquired pneumonia of right upper lobe of lung ([place])  Sepsis, due to unspecified organism [facility])    New Prescriptions New Prescriptions   No medications on file     Nona Dell, Hershal Coria 11/26/16 1436    Lajean Saver, MD 11/26/16 [phone]

## 2016-11-26 NOTE — H&P (Signed)
History and Physical    KELAIAH ESCALONA LNL:[id number] DOB: 05-May-1961 DOA: 11/26/2016  PCP: Glendale Chard, MD  Patient coming from: home  Chief Complaint:   HPI: Brittany Rubio is a 56 y.o. female with medical history significant of HTN, breast CA, DM type II, HLD, GERD, thyroid carcinoma who abruptly developed SOB and chet pain last night. The pain located in right subscapular region and radiated tot he anterior chest. Pain had pleuritic component as well and since morning the SOB and pain were getting progressively worse so patient presented to the ED for an evaluation,. Patient denied cough, headache, rhinorrhea, muscle ache or joint pain, but reported that during the last two weeks she was experiencing malaise and weakness  ED Course: on arrival she was subfebrile with temperature 37F, tachycardic with heart rate 122, to keep moving with respirations of 48/m, hypotensive with initial blood pressure 156/99 and dropping to 80/66 millimeters of mercury. Her oxygen saturation dropped as well to 80% on room air Blood cells count was elevated-19,900, sodium 134, chloride 95, creatinine 1.35 withno previous history of abnormal renal function Lactic acid was elevated at 4.02 Chest x-ray showed a right upper lobe consolidation suspicious for pneumonia and small bilateral pleural effusions. Chest CT confirmed right upper lobe pneumonia with significant consolidation and some patchy infiltrate in the right lower lobe EKG - sinus tachycardia Review of Systems: As per HPI otherwise all other systems reviewed and  are negative  Ambulatory Status: Independent  Past Medical History:  Diagnosis Date  . Arthritis    "left shoulder" (07/07/2014)  . Breast cancer ([place])    "left"  . GERD (gastroesophageal reflux disease)    "takes over the counters as needed"  . High cholesterol   . Hypertension 11/26/2011   sees Dr. Bryon Lions  . Migraines    "maybe once q 3 months" (07/07/2014)  .  Multinodular thyroid 2015  . OSA on CPAP   . Personal history of radiation therapy   . Type II diabetes mellitus ([place])     Past Surgical History:  Procedure Laterality Date  . ABDOMINAL HYSTERECTOMY  ? 1997  . BREAST EXCISIONAL BIOPSY Right 2014  . BREAST LUMPECTOMY Left 2009  . BREAST LUMPECTOMY Right 07/2012   "not a mastectomy"  . [place]   "trauma"  . PARTIAL MASTECTOMY WITH NEEDLE LOCALIZATION  08/12/2012   Procedure: PARTIAL MASTECTOMY WITH NEEDLE LOCALIZATION;  Surgeon: Marcello Moores A. Cornett, MD;  Location: [facility];  Service: General;  Laterality: Right;  . REDUCTION MAMMAPLASTY Bilateral 1995  . THYROIDECTOMY Left 07/07/2014   Procedure: LEFT HEMI-THYROIDECTOMY;  Surgeon: Ascencion Dike, MD;  Location: [facility];  Service: ENT;  Laterality: Left;  . THYROIDECTOMY Right 07/08/2014   Procedure: Completion THYROIDECTOMY;  Surgeon: Ascencion Dike, MD;  Location: [facility];  Service: ENT;  Laterality: Right;  . THYROIDECTOMY, PARTIAL Left 07/07/2014   hemi    Social History   Social History  . Marital status: Married    Spouse name: N/A  . Number of children: N/A  . Years of education: N/A   Occupational History  . Not on file.   Social History Main Topics  . Smoking status: Never Smoker  . Smokeless tobacco: Never Used  . Alcohol use Yes     Comment: 07/07/2014 "a few times/year; weddings, anniversary, etc"  . Drug use: No  . Sexual activity: Yes   Other Topics Concern  . Not on file   Social History Narrative  .  No narrative on file    Allergies  Allergen Reactions  . Iodine     Other reaction(s): Hives/Skin Rash  . Shellfish Allergy Swelling    Family History  Problem Relation Age of Onset  . Cancer Mother        Pancreatic    Prior to Admission medications   Medication Sig Start Date End Date Taking? Authorizing Provider  butorphanol (STADOL) 10 MG/ML nasal spray Place 1 spray into the nose every 4 (four) hours as needed. For migraines   Yes [provider]  Dapagliflozin Propanediol (FARXIGA) 10 MG TABS Take 10 mg by mouth daily.   Yes [provider]  diltiazem (MATZIM LA) 240 MG 24 hr tablet Take 240 mg by mouth at bedtime.    Yes Glendale Chard, MD  esomeprazole (NEXIUM) 20 MG capsule Take 20 mg by mouth daily at 12 noon.   Yes [provider]  ibuprofen (ADVIL,MOTRIN) 800 MG tablet Take 1 tablet (800 mg total) by mouth 3 (three) times daily. Patient taking differently: Take 800 mg by mouth 2 (two) times daily as needed for mild pain.  05/15/14  Yes Muthersbaugh, Jarrett Soho, PA-C  JANUMET 50-1000 MG tablet Take 1 tablet by mouth 2 (two) times daily with a meal. 11/10/16  Yes [provider]  levothyroxine (SYNTHROID) 100 MCG tablet Take 1 tablet (100 mcg total) by mouth daily before breakfast. 07/10/14  Yes Leta Baptist, MD  lisinopril-hydrochlorothiazide (PRINZIDE,ZESTORETIC) 20-12.5 MG per tablet Take 1 tablet by mouth daily.    Yes [provider]  LIVALO 4 MG TABS Take 4 mg by mouth daily.  06/14/14  Yes [provider]  traZODone (DESYREL) 50 MG tablet Take 100 mg by mouth at bedtime.  06/15/14  Yes [provider]  amphetamine-dextroamphetamine (ADDERALL) 20 MG tablet Take 20 mg by mouth daily.  08/26/12   [provider]  calcium carbonate (OS-CAL - DOSED IN MG OF ELEMENTAL CALCIUM) 1250 MG tablet Take 1 tablet (500 mg of elemental calcium total) by mouth 2 (two) times daily with a meal. 07/10/14   Leta Baptist, MD  cyclobenzaprine (FLEXERIL) 5 MG tablet Take 1 tablet (5 mg total) by mouth 3 (three) times daily as needed for muscle spasms. 10/06/14   Margarita Mail, PA-C  HYDROcodone-acetaminophen (NORCO/VICODIN) 5-325 MG per tablet Take 1-2 tablets by mouth every 6 (six) hours as needed for moderate pain or severe pain. Patient not taking: Reported on 06/24/2014 05/15/14   Muthersbaugh, Jarrett Soho, PA-C  levocetirizine (XYZAL) 5 MG tablet Take 5 mg by mouth every evening. As needed  for allergies    [provider]  lidocaine (LIDODERM) 5 % Place 1 patch onto the skin daily. Remove & Discard patch within 12 hours or as directed by MD 10/06/14   Margarita Mail, PA-C  naproxen (NAPROSYN) 500 MG tablet Take 1 tablet (500 mg total) by mouth 2 (two) times daily. 10/06/14   Margarita Mail, PA-C  oxyCODONE-acetaminophen (PERCOCET/ROXICET) 5-325 MG per tablet Take 1 tablet by mouth every 4 (four) hours as needed for severe pain. 03/11/15   Gloriann Loan, PA-C  Saxagliptin-Metformin (KOMBIGLYZE XR) 2.11-998 MG TB24 Take 2 tablets by mouth daily.     Glendale Chard, MD    Physical Exam: Vitals:   11/26/16 1408 11/26/16 1415 11/26/16 1430 11/26/16 1443  BP:  93/61 (!) 80/66 (!) 85/59  Pulse: (!) 119 (!) 122 (!) 121 (!) 121  Resp: (!) 22 (!) 28 (!) 35 (!) 39  Temp:  TempSrc:      SpO2: (!) 88% 93% 92% 91%     General: Appears calm and comfortable Eyes: PERRLA, EOMI, normal lids, iris ENT:  grossly normal hearing, lips & tongue, mucous membranes moist and intact Neck: no lymphoadenopathy, masses or thyromegaly Cardiovascular: RRR, no m/r/g. No JVD, carotid bruits. No LE edema.  Respiratory: bilateral no wheezes, rales and rhonchi over the right lung field, shallow rapid respirations. No accessory muscle use observed Abdomen: soft, non-tender, non-distended, no organomegaly or masses appreciated. BS present in all quadrants Skin: no rash, ulcers or induration seen on limited exam Musculoskeletal: grossly normal tone BUE/BLE, good ROM, no bony abnormality or joint deformities observed Psychiatric: grossly normal mood and affect, speech fluent and appropriate, alert and oriented x3 Neurologic: CN II-XII grossly intact, moves all extremities in coordinated fashion, sensation intact  Labs on Admission: I have personally reviewed following labs and imaging studies  CBC, BMP  GFR: CrCl cannot be calculated (Unknown ideal weight.).   Creatinine Clearance: CrCl  cannot be calculated (Unknown ideal weight.).    Radiological Exams on Admission: Dg Chest 2 View  Result Date: 11/26/2016 CLINICAL DATA:  Chest pain and shortness of breath EXAM: CHEST  2 VIEW COMPARISON:  03/11/2015 FINDINGS: Normal heart size. Small pleural effusions suspected. Airspace consolidation within the right upper lobe is identified. Left lung appears clear. IMPRESSION: 1. Right upper lobe airspace consolidation is identified. In the acute setting this is compatible with pneumonia. Followup PA and lateral chest X-ray is recommended in 3-4 weeks following trial of antibiotic therapy to ensure resolution and exclude underlying malignancy. 2. Small pleural effusions. Electronically Signed   By: Kerby Moors M.D.   On: 11/26/2016 10:44   Ct Angio Chest Pe W Or Wo Contrast  Result Date: 11/26/2016 CLINICAL DATA:  Shortness of breath and right-sided chest pain EXAM: CT ANGIOGRAPHY CHEST WITH CONTRAST TECHNIQUE: Multidetector CT imaging of the chest was performed using the standard protocol during bolus administration of intravenous contrast. Multiplanar CT image reconstructions and MIPs were obtained to evaluate the vascular anatomy. CONTRAST:  80 mL Isovue 370. COMPARISON:  None. FINDINGS: Cardiovascular: The thoracic aorta is well visualized without evidence of aneurysmal dilatation or dissection. The pulmonary artery shows a normal branching pattern. No filling defects to suggest pulmonary emboli are identified. Mediastinum/Nodes: The thoracic inlet is within normal limits. Scattered small mediastinal lymph nodes are identified. No significant adenopathy is seen. Lungs/Pleura: Left lung is well aerated without focal infiltrate or sizable effusion. The right lung demonstrates considerable consolidation in the right upper lobe similar to that seen on recent plain film examination consistent with acute pneumonia. Additionally some patchy changes are noted in the right lower lobe. No significant  effusion or pneumothorax is seen. Upper Abdomen: Fatty infiltration of the liver is noted. Musculoskeletal: Degenerative changes of the thoracic spine are seen. Review of the MIP images confirms the above findings. IMPRESSION: Right upper lobe pneumonia with significant consolidation. Some patchy infiltrate is also noted in the right lower lobe. No evidence of pulmonary emboli. Electronically Signed   By: Inez Catalina M.D.   On: 11/26/2016 13:22    EKG: Independently reviewed - sinus tachycardia without acute ischemic ST-T wave changes  Assessment/Plan Principal Problem:   Sepsis ([place]) Active Problems:   Hypertension   Diabetes mellitus ([place])   CAP (community acquired pneumonia)   AKI (acute kidney injury) ([place])   Hyperlipidemia   GERD (gastroesophageal reflux disease)   Severe sepsis with septic shock - most likely  associated with CAP Continue IV fluids, follow lactic acid and Procalcitonin level Treat underlying cause  CAP  - Lobar PNA Continue Zithromax and Rocephin IV Add expectorant, nebulizer therapy, no need for IV steroid niw as patient does not have any wheezes  HTN Currently patient is hypotensive Will place on hold antihypertensive meds  DM type II  Hold Janumet Start SSI, carb controlled diet and monitor CBG's  AKI - last creatinine in 2015 was normal, today's creatinine is 1.35 The husband reported tha PCP diagnosed the patient with CKD Continue to monitor renal indices, avoid nephrotoxic meds and adjust dioses to renal  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

## 2016-11-26 NOTE — Procedures (Signed)
Central Venous Catheter Insertion Procedure Note Brittany Rubio [id number] 17-Jun-1961  Procedure: Insertion of Central Venous Catheter Indications: Assessment of intravascular volume, Drug and/or fluid administration and Frequent blood sampling  Procedure Details Consent: Risks of procedure as well as the alternatives and risks of each were explained to the (patient/caregiver).  Consent for procedure obtained. Time Out: Verified patient identification, verified procedure, site/side was marked, verified correct patient position, special equipment/implants available, medications/allergies/relevent history reviewed, required imaging and test results available.  Performed  Maximum sterile technique was used including antiseptics, cap, gloves, gown, hand hygiene, mask and sheet. Skin prep: Chlorhexidine; local anesthetic administered A antimicrobial bonded/coated triple lumen catheter was placed in the right internal jugular vein using the Seldinger technique.  Evaluation Blood flow good Complications: No apparent complications Patient did tolerate procedure well. Chest X-ray ordered to verify placement.  CXR: pending.  Procedure performed under direct ultrasound guidance for real time vessel cannulation.      Montey Hora, [facility] Pager: [phone]  or [phone] 11/26/2016, 8:00 PM

## 2016-11-26 NOTE — Progress Notes (Signed)
CRITICAL VALUE ALERT  Critical value received:  Trop-0.03  Date of notification:  11/26/2016  Time of notification:  2150  Critical value read back:Yes.    Nurse who received alert:  Dillard Essex  MD notified (1st page):  Dr. Lamonte Sakai  Time of first page:  2150  MD notified (2nd page):  Time of second page:  Responding MD:  Dr. Lamonte Sakai  Time MD responded:  2150

## 2016-11-26 NOTE — Procedures (Signed)
Arterial Catheter Insertion Procedure Note Brittany Rubio [id number] [birth year]  Procedure: Insertion of Arterial Catheter  Indications: Blood pressure monitoring and Frequent blood sampling  Procedure Details Consent: Risks of procedure as well as the alternatives and risks of each were explained to the (patient/caregiver).  Consent for procedure obtained. Time Out: Verified patient identification, verified procedure, site/side was marked, verified correct patient position, special equipment/implants available, medications/allergies/relevent history reviewed, required imaging and test results available.  Performed  Maximum sterile technique was used including antiseptics, cap, gloves, gown, hand hygiene, mask and sheet. Skin prep: Chlorhexidine; local anesthetic administered 20 gauge catheter was inserted into left radial artery using the Seldinger technique.  Evaluation Blood flow good; BP tracing good. Complications: No apparent complications.   Soyla Dryer 11/26/2016

## 2016-11-26 NOTE — ED Notes (Signed)
Provider made aware of hypotension.

## 2016-11-26 NOTE — Progress Notes (Signed)
Gold colored ring given to patient's husband per patient request.

## 2016-11-26 NOTE — ED Triage Notes (Signed)
Pt reports she began having shortness of breath last night and pain underneath her right scapula. Husband reports pt was tachycardic and tachypnic at home this morning.

## 2016-11-27 ENCOUNTER — Inpatient Hospital Stay (HOSPITAL_COMMUNITY): Payer: BC Managed Care – PPO

## 2016-11-27 DIAGNOSIS — I517 Cardiomegaly: Secondary | ICD-10-CM

## 2016-11-27 DIAGNOSIS — N179 Acute kidney failure, unspecified: Secondary | ICD-10-CM

## 2016-11-27 LAB — BLOOD GAS, ARTERIAL
Acid-base deficit: 3.9 mmol/L — ABNORMAL HIGH (ref 0.0–2.0)
Bicarbonate: 20.6 mmol/L (ref 20.0–28.0)
Drawn by: 345601
Expiratory PAP: 8
FIO2: 0.6
Inspiratory PAP: 12
Mode: POSITIVE
O2 Saturation: 98.6 %
Patient temperature: 98.6
RATE: 10 resp/min
pCO2 arterial: 37.4 mmHg (ref 32.0–48.0)
pH, Arterial: 7.36 (ref 7.350–7.450)
pO2, Arterial: 145 mmHg — ABNORMAL HIGH (ref 83.0–108.0)

## 2016-11-27 LAB — POCT I-STAT 3, VENOUS BLOOD GAS (G3P V)
Acid-base deficit: 4 mmol/L — ABNORMAL HIGH (ref 0.0–2.0)
Bicarbonate: 22.3 mmol/L (ref 20.0–28.0)
O2 Saturation: 61 %
Patient temperature: 99.6
TCO2: 24 mmol/L (ref 0–100)
pCO2, Ven: 48 mmHg (ref 44.0–60.0)
pH, Ven: 7.279 (ref 7.250–7.430)
pO2, Ven: 37 mmHg (ref 32.0–45.0)

## 2016-11-27 LAB — INFLUENZA PANEL BY PCR (TYPE A & B)
Influenza A By PCR: NEGATIVE
Influenza B By PCR: NEGATIVE

## 2016-11-27 LAB — CBC
HCT: 32.2 % — ABNORMAL LOW (ref 36.0–46.0)
Hemoglobin: 9.9 g/dL — ABNORMAL LOW (ref 12.0–15.0)
MCH: 22.4 pg — ABNORMAL LOW (ref 26.0–34.0)
MCHC: 30.7 g/dL (ref 30.0–36.0)
MCV: 72.9 fL — ABNORMAL LOW (ref 78.0–100.0)
Platelets: 210 10*3/uL (ref 150–400)
RBC: 4.42 MIL/uL (ref 3.87–5.11)
RDW: 17.8 % — ABNORMAL HIGH (ref 11.5–15.5)
WBC: 24.4 10*3/uL — ABNORMAL HIGH (ref 4.0–10.5)

## 2016-11-27 LAB — TROPONIN I
Troponin I: 0.07 ng/mL (ref <0.03)
Troponin I: 0.25 ng/mL (ref <0.03)

## 2016-11-27 LAB — BASIC METABOLIC PANEL
Anion gap: 13 (ref 5–15)
BUN: 34 mg/dL — ABNORMAL HIGH (ref 6–20)
CO2: 19 mmol/L — ABNORMAL LOW (ref 22–32)
Calcium: 7.4 mg/dL — ABNORMAL LOW (ref 8.9–10.3)
Chloride: 100 mmol/L — ABNORMAL LOW (ref 101–111)
Creatinine, Ser: 1.78 mg/dL — ABNORMAL HIGH (ref 0.44–1.00)
GFR calc Af Amer: 35 mL/min — ABNORMAL LOW (ref >60)
GFR calc non Af Amer: 31 mL/min — ABNORMAL LOW (ref >60)
Glucose, Bld: 250 mg/dL — ABNORMAL HIGH (ref 65–99)
Potassium: 5 mmol/L (ref 3.5–5.1)
Sodium: 132 mmol/L — ABNORMAL LOW (ref 135–145)

## 2016-11-27 LAB — GLUCOSE, CAPILLARY
Glucose-Capillary: 236 mg/dL — ABNORMAL HIGH (ref 65–99)
Glucose-Capillary: 201 mg/dL — ABNORMAL HIGH (ref 65–99)
Glucose-Capillary: 186 mg/dL — ABNORMAL HIGH (ref 65–99)
Glucose-Capillary: 158 mg/dL — ABNORMAL HIGH (ref 65–99)
Glucose-Capillary: 106 mg/dL — ABNORMAL HIGH (ref 65–99)
Glucose-Capillary: 86 mg/dL (ref 65–99)

## 2016-11-27 LAB — LACTIC ACID, PLASMA: Lactic Acid, Venous: 3.7 mmol/L (ref 0.5–1.9)

## 2016-11-27 LAB — ECHOCARDIOGRAM COMPLETE
Height: 64 in
Weight: 3977.6 oz

## 2016-11-27 LAB — MAGNESIUM: Magnesium: 1.4 mg/dL — ABNORMAL LOW (ref 1.7–2.4)

## 2016-11-27 LAB — HIV ANTIBODY (ROUTINE TESTING W REFLEX): HIV Screen 4th Generation wRfx: NONREACTIVE

## 2016-11-27 LAB — PHOSPHORUS: Phosphorus: 5.4 mg/dL — ABNORMAL HIGH (ref 2.5–4.6)

## 2016-11-27 LAB — STREP PNEUMONIAE URINARY ANTIGEN: Strep Pneumo Urinary Antigen: POSITIVE — AB

## 2016-11-27 MED ORDER — SODIUM CHLORIDE 0.9% FLUSH: 10.0000 mL | INTRAVENOUS | Status: DC | PRN

## 2016-11-27 MED ORDER — FUROSEMIDE 10 MG/ML IJ SOLN
40.0000 mg | Freq: Once | INTRAMUSCULAR | Status: AC
  Administered 2016-11-27: 40 mg via INTRAVENOUS
  Filled 2016-11-27: qty 4

## 2016-11-27 MED ORDER — PANTOPRAZOLE SODIUM 40 MG PO TBEC
40.0000 mg | DELAYED_RELEASE_TABLET | Freq: Every day | ORAL | Status: DC
  Administered 2016-11-27 – 2016-12-02 (×6): 40 mg via ORAL
  Filled 2016-11-27 (×6): qty 1

## 2016-11-27 MED ORDER — PNEUMOCOCCAL VAC POLYVALENT 25 MCG/0.5ML IJ INJ
0.5000 mL | INJECTION | INTRAMUSCULAR | Status: DC
  Filled 2016-11-27: qty 0.5

## 2016-11-27 MED ORDER — INSULIN ASPART 100 UNIT/ML ~~LOC~~ SOLN
0.0000 [IU] | SUBCUTANEOUS | Status: DC
  Administered 2016-11-27: 3 [IU] via SUBCUTANEOUS
  Administered 2016-11-27: 2 [IU] via SUBCUTANEOUS
  Administered 2016-11-27: 3 [IU] via SUBCUTANEOUS
  Administered 2016-11-27: 2 [IU] via SUBCUTANEOUS
  Administered 2016-11-27: 5 [IU] via SUBCUTANEOUS

## 2016-11-27 MED ORDER — MAGNESIUM SULFATE 4 GM/100ML IV SOLN
4.0000 g | Freq: Once | INTRAVENOUS | Status: AC
  Administered 2016-11-27: 4 g via INTRAVENOUS
  Filled 2016-11-27: qty 100

## 2016-11-27 MED ORDER — SODIUM CHLORIDE 0.9% FLUSH
10.0000 mL | Freq: Two times a day (BID) | INTRAVENOUS | Status: DC
  Administered 2016-11-27 – 2016-11-28 (×3): 10 mL

## 2016-11-27 MED ORDER — CHLORHEXIDINE GLUCONATE CLOTH 2 % EX PADS
6.0000 | MEDICATED_PAD | Freq: Every day | CUTANEOUS | Status: DC
  Administered 2016-11-27: 6 via TOPICAL

## 2016-11-27 MED ORDER — LEVOTHYROXINE SODIUM 112 MCG PO TABS
112.0000 ug | ORAL_TABLET | Freq: Every day | ORAL | Status: DC
  Administered 2016-11-27 – 2016-11-28 (×2): 112 ug via ORAL
  Filled 2016-11-27 (×2): qty 1

## 2016-11-27 NOTE — Progress Notes (Signed)
PULMONARY / CRITICAL CARE MEDICINE   Name: PAKOU RAINBOW MRN: [id number] DOB: [birth year]    ADMISSION DATE:  11/26/2016 CONSULTATION DATE:  11/26/2016  REFERRING MD:  Dr. Marily Memos  CHIEF COMPLAINT:  Septic Shock   HISTORY OF PRESENT ILLNESS:   56 year old female with PMH as below who was admitted on 5/14 with sepsis most likely related to CAP.  She presented to the ER with complaints of progressive shortness of breath and chest pain that started the night before.  She was found to be tachycardic at 122, tachypneic in the 40's, temp 99, and blood pressure initially normal but became hypotensive to 80/66 and hypoxic around 80% on room air.  WBC 19,900, Na 134, Cl 95, sCr 1.35, lactic acid 4.02, and D-dimer 0.97.  EKG showed sinus tachycardia.  Chest xray showed right upper lobe consolidation and small bilateral pleural effusions.  Chest CT showed right upper lobe pneumonia with significant consolidation and some patchy infiltrate in the right lower lobe; no pulmonary emboli.  She was started on Zithromax and cefepime.  She was admitted to [facility] and placed in stepdown.  While there, she was placed on BiPAP for worsening shortness of breath and increasing work of breathing.  Additionally, she became hypotensive, not fluid responsive.  PCCM was consulted and she was moved to the ICU for septic shock.  Subjective:  Continues to be on bipap.  Levophed weaned off this morning.  Dose of lasix given this morning.  VITAL SIGNS: BP 130/77   Pulse (!) 111   Temp 99.4 F (37.4 C) (Oral)   Resp (!) 39   Ht 5\' 4"  (1.626 m)   Wt 248 lb 9.6 oz (112.8 kg)   SpO2 99%   BMI 42.67 kg/m   HEMODYNAMICS: CVP:  [10 mmHg-14 mmHg] 10 mmHg  VENTILATOR SETTINGS: FiO2 (%):  [60 %-100 %] 60 %  INTAKE / OUTPUT: I/O last 3 completed shifts: In: 2028.3 [I.V.:212.3; Other:216; IV Piggyback:1600] Out: 510 [Urine:510]  PHYSICAL EXAMINATION: General:  Adult female, sitting in bed on bipap, minimal  distress HEENT: MM pink/dry, scleral anicteric  Neuro: Awake, follows commands, non focal CV: s1s2 rrr, no m/r/g PULM: even-labored on BiPAP, Diffuse crackles in right lobe GI: obese, soft, non-tender, bsx4 active  Extremities: warm/dry, no edema  Skin: no rashes or lesions   LABS:  BMET  Recent Labs Lab 11/26/16 1008 11/26/16 1836 11/27/16 0106  NA 134* 134* 132*  K 3.7 3.4* 5.0  CL 95* 99* 100*  CO2 24 20* 19*  BUN 17 22* 34*  CREATININE 1.35* 1.48* 1.78*  GLUCOSE 291* 213* 250*    Electrolytes  Recent Labs Lab 11/26/16 1008 11/26/16 1836 11/27/16 0106  CALCIUM 8.9 7.6* 7.4*  MG  --  1.2* 1.4*  PHOS  --  3.4 5.4*    CBC  Recent Labs Lab 11/26/16 1008 11/26/16 1836 11/27/16 0106  WBC 19.9* 15.7* 24.4*  HGB 12.5 11.1* 9.9*  HCT 39.0 35.3* 32.2*  PLT 250 204 210    Coag's  Recent Labs Lab 11/26/16 1836  APTT 38*  INR 1.21    Sepsis Markers  Recent Labs Lab 11/26/16 1234 11/26/16 1836 11/26/16 2140  LATICACIDVEN 4.02* 4.1* 3.7*  PROCALCITON  --  35.53  --     ABG  Recent Labs Lab 11/26/16 1742 11/26/16 1905 11/27/16 0411  PHART 7.326* 7.339* 7.360  PCO2ART 40.6 37.7 37.4  PO2ART 68.2* 144* 145*    Liver Enzymes No results for input(s): AST,  ALT, ALKPHOS, BILITOT, ALBUMIN in the last 168 hours.  Cardiac Enzymes  Recent Labs Lab 11/26/16 2030 11/27/16 0106  TROPONINI 0.03* 0.07*    Glucose  Recent Labs Lab 11/26/16 1823 11/26/16 2036 11/26/16 2323 11/27/16 0303  GLUCAP 198* 229* 252* 236*    Imaging Dg Chest 2 View  Result Date: 11/26/2016 CLINICAL DATA:  Chest pain and shortness of breath EXAM: CHEST  2 VIEW COMPARISON:  03/11/2015 FINDINGS: Normal heart size. Small pleural effusions suspected. Airspace consolidation within the right upper lobe is identified. Left lung appears clear. IMPRESSION: 1. Right upper lobe airspace consolidation is identified. In the acute setting this is compatible with pneumonia.  Followup PA and lateral chest X-ray is recommended in 3-4 weeks following trial of antibiotic therapy to ensure resolution and exclude underlying malignancy. 2. Small pleural effusions. Electronically Signed   By: Kerby Moors M.D.   On: 11/26/2016 10:44   Ct Angio Chest Pe W Or Wo Contrast  Result Date: 11/26/2016 CLINICAL DATA:  Shortness of breath and right-sided chest pain EXAM: CT ANGIOGRAPHY CHEST WITH CONTRAST TECHNIQUE: Multidetector CT imaging of the chest was performed using the standard protocol during bolus administration of intravenous contrast. Multiplanar CT image reconstructions and MIPs were obtained to evaluate the vascular anatomy. CONTRAST:  80 mL Isovue 370. COMPARISON:  None. FINDINGS: Cardiovascular: The thoracic aorta is well visualized without evidence of aneurysmal dilatation or dissection. The pulmonary artery shows a normal branching pattern. No filling defects to suggest pulmonary emboli are identified. Mediastinum/Nodes: The thoracic inlet is within normal limits. Scattered small mediastinal lymph nodes are identified. No significant adenopathy is seen. Lungs/Pleura: Left lung is well aerated without focal infiltrate or sizable effusion. The right lung demonstrates considerable consolidation in the right upper lobe similar to that seen on recent plain film examination consistent with acute pneumonia. Additionally some patchy changes are noted in the right lower lobe. No significant effusion or pneumothorax is seen. Upper Abdomen: Fatty infiltration of the liver is noted. Musculoskeletal: Degenerative changes of the thoracic spine are seen. Review of the MIP images confirms the above findings. IMPRESSION: Right upper lobe pneumonia with significant consolidation. Some patchy infiltrate is also noted in the right lower lobe. No evidence of pulmonary emboli. Electronically Signed   By: Inez Catalina M.D.   On: 11/26/2016 13:22   Dg Chest Port 1 View  Result Date:  11/27/2016 CLINICAL DATA:  Shortness of breath this morning. EXAM: PORTABLE CHEST 1 VIEW COMPARISON:  Yesterday at 1956 hour FINDINGS: Right internal jugular central venous catheter tip at the atrial caval junction. Previous left central line is been removed. Low lung volumes persist. Increasing right upper lobe opacity abutting the fissure. Increasing left lung base opacity. Unchanged heart size and mediastinal contours. There is vascular congestion. IMPRESSION: Findings concerning for worsening multifocal pneumonia in the right upper and left lower lobes. Electronically Signed   By: Jeb Levering M.D.   On: 11/27/2016 06:07   Dg Chest Port 1 View  Result Date: 11/26/2016 CLINICAL DATA:  Central line placement. EXAM: PORTABLE CHEST 1 VIEW COMPARISON:  Chest radiograph Nov 26, 2016 at 1923 hours FINDINGS: New RIGHT internal jugular central venous catheter distal tip projects the cavoatrial junction. Unchanged catheter projecting LEFT chest. Cardiac silhouette is mildly enlarged. Patchy consolidation RIGHT upper lobe. No pleural effusion. No pneumothorax. Habitus limited examination. IMPRESSION: New RIGHT internal jugular central venous catheter distal tip projects the cavoatrial junction. No pneumothorax. Stable position of LEFT catheter. Mild cardiomegaly.  RIGHT upper  lobe pneumonia. Electronically Signed   By: Elon Alas M.D.   On: 11/26/2016 20:19   Dg Chest Port 1 View  Result Date: 11/26/2016 CLINICAL DATA:  Central line placement EXAM: PORTABLE CHEST 1 VIEW COMPARISON:  Earlier same day FINDINGS: Left neck catheter takes an atypical course to the left of the spine. This could be within the superior intercostal vein, but aortic positioning is not excluded. Extensive pneumonia in the right lung is less densely consolidated than on the previous image. IMPRESSION: Central line atypically positioned. Whereas this could be an a venous structure such as the superior intercostal vein, arterial  positioning is not excluded. These results will be called to the ordering clinician or representative by the Radiologist Assistant, and communication documented in the PACS or zVision Dashboard. Electronically Signed   By: Nelson Chimes M.D.   On: 11/26/2016 19:39   Dg Chest Port 1 View  Result Date: 11/26/2016 CLINICAL DATA:  Respiratory distress. Hx of breast cancer, GERD, hypertension, migraines, diabetes, OSA on CPAP, diaphragm surgery(1986)(trauma). Nonsmoker. EXAM: PORTABLE CHEST 1 VIEW COMPARISON:  11/26/2016 at 1552 hours FINDINGS: As noted on the earlier exam, there is right mid to lower lung zone consolidation, sparing the right apex. This is without significant change from the prior exam allowing for differences in patient positioning and technique. Left lung remains clear. Cardiac silhouette is normal in size. No definite pleural effusion.  No pneumothorax. IMPRESSION: 1. No significant change from the earlier exam. 2. Extensive right lung consolidation consistent with pneumonia. Electronically Signed   By: Lajean Manes M.D.   On: 11/26/2016 17:59   Dg Chest Port 1 View  Result Date: 11/26/2016 CLINICAL DATA:  56 year old female with history of shortness of breath and right-sided chest pain, worsening this morning with some associated shortness of breath. EXAM: PORTABLE CHEST 1 VIEW COMPARISON:  Chest x-ray 11/26/2016. FINDINGS: Significantly worsening airspace consolidation throughout the entire right lung, concerning for progressively worsening multilobar pneumonia. Probable small right pleural effusion. Left lung is clear. No evidence of pulmonary edema. Heart size is borderline enlarged. Upper mediastinal contours are within normal limits. IMPRESSION: 1. Worsening multilobar pneumonia in the right lung involving at least the right upper and right lower lobes. 2. Probable small right parapneumonic pleural effusion. Electronically Signed   By: Vinnie Langton M.D.   On: 11/26/2016 16:14     STUDIES:  Chest CTA 5/14 >> RUL pneumonia with significant consolidation, patchy RLL inflitrate, neg for PE ECHO >>  CULTURES: MRSA 5/14 >> [place] 5/14 >> [facility] 5/14 >>  ANTIBIOTICS: ceftriaxiane 5/14 >>5/14 Cefepime 5/14 >>  Azithromycin 5/14 >> Vancomycin 5/14 >>  SIGNIFICANT EVENTS: 5/14 Admit with sepsis and CAP 5/15- NIMV, levophed on / off  LINES/TUBES: PIV  Central venous catheter RIJ 5/14   DISCUSSION:  88 yof with septic shock 2/2 right CAP on BiPAP.  Moved to ICU for progressive hypotension and respiratory distress  ASSESSMENT / PLAN:  PULMONARY A: Acute hypoxic respiratory failure Right CAP  Pulmonary edema NOT impressed P:   Continue BiPAP, cycled Wean FiO2 for sats > 94% Continue abx as below Trend CXR as needed  Duonebs prn  CARDIOVASCULAR A:  Septic shock -  Hx HTN, HLD Troponin 0.03>0.07, likely demand P:  ICU monitoring Goal map > 65 Off levophed  Trend CVP 14 = kvo Assess ECHO  Holding home HTN, HLD meds   RENAL A:   AKI-  Lactic acidosis -s/p 3l ? Hx of CKD- normal sCr previously P:  Hold further IVF in the setting of pulmonary edema Trend BMP, mag, phos Trend urinary output Replace electrolytes as indicated Avoid nephrotoxic agents, ensure adequate renal perfusion Lasix to hold  GASTROINTESTINAL A:   GERD P:   Protonix for SUP NPO for now  Assess coags  HEMATOLOGIC A:   Leukocytosis  P:  Heparin for DVT prophylaxis   INFECTIOUS A:   CAP rt Leukocytosis  Strep pneumo urinary antigen positive P:   Continue zithromax, cefepime and vanc Follow cultures Trend PCT Trend lactate  Assess HIV  ENDOCRINE A:   DM type II Cortisol 52 Hypothyroidism s/p Thyroid carcinoma P:   SSI CBG q 4hr Continue synthroid  Dc steroids  NEUROLOGIC A:   No acute issues  Hx depression P:   Minimize sedation Continue viibryd Hold trazadone   FAMILY  - Updates: Pts husband updated at bedside by Dr. Nelda Marseille 5/14  -  Inter-disciplinary family meet or Palliative Care meeting due by:  12/03/16   STAFF NOTE: Linwood Dibbles, MD FACP have personally reviewed patient's available data, including medical history, events of note, physical examination and test results as part of my evaluation. I have discussed with resident/NP and other care providers such as pharmacist, RN and RRT. In addition, I personally evaluated patient and elicited key findings of: awake, follows commands, mild to mod resp failure on NIMV, ronchi coarse rt greater left< I reviewed all films and CT c/w rt PNA dense infiltrate, NOT to impressed for pulm edema, crt also bump with lasix, plan is to schedule NIMV but must have slight interruption 10-20 min to avoid facial trauma, can provide 100% when off, ABG reviewed, ventilation is acceptable, pao2 also 145 reassuring to some extent, not convinced she wont get intubated, concerns remain, pcxr repeat in am , consider flu r/o , urinary strep noted, likley is pathogen but will maintain current coverage 24 hours further and follow cultures, cvp 14, keep even, crt bump with lasix, dc lasix, mag supp, NPO with resp status on nimv, cortisol adequate, dc steroids stress, chem in am , limit sedation, I updated huisband in full at bedside The patient is critically ill with multiple organ systems failure and requires high complexity decision making for assessment and support, frequent evaluation and titration of therapies, application of advanced monitoring technologies and extensive interpretation of multiple databases.   Critical Care Time devoted to patient care services described in this note is 35 Minutes. This time reflects time of care of this signee: Merrie Roof, MD FACP. This critical care time does not reflect procedure time, or teaching time or supervisory time of PA/NP/Med student/Med Resident etc but could involve care discussion time. Rest per NP/medical resident whose note is outlined above and  that I agree with   Lavon Paganini. Titus Mould, MD, [place] Pgr: [facility] 11/27/2016 8:59 AM

## 2016-11-27 NOTE — Progress Notes (Signed)
    Pt condition deteriorating rapidly. Called to bedside by nursing staff. Stat CXR, ABG, and BIPAP ordered. CXR read by me showing worsening pulmonary edema. ABG reviewed showing respiratory failure. Pt mentating well but in marked distress which was improved on BIPAP. Pts blood pressure continues to drop despite aggressive IVF. Will likely need pressers and then diuresis w/ possible intubation. Discussed case w/ Dr. Lamonte Sakai and Dr. Nelda Marseille. Pt will be transferred to the ICU for closer management.    Linna Darner, MD [facility] 11/27/2016, 1:16 PM  >70 min in total time spent in critical care direct face to face pt care and coordination of care.

## 2016-11-27 NOTE — Progress Notes (Signed)
[facility] Progress Note Patient Name: Brittany Rubio DOB: 21-Feb-1961 MRN: [id number]   Date of Service  11/27/2016  HPI/Events of Note  Pt with sepsis and pulm edema with volume resuscitation. On Bipap. Levophed was weaned off this am.   eICU Interventions  Trial with lasix.      Intervention Category Major Interventions: Other:  [place] 11/27/2016, 5:49 AM

## 2016-11-27 NOTE — Progress Notes (Signed)
  Echocardiogram 2D Echocardiogram has been performed.  Brittany Rubio 11/27/2016, 3:55 PM

## 2016-11-27 NOTE — Progress Notes (Signed)
CRITICAL VALUE ALERT  Critical value received:  Lactic acid-3.7  Date of notification:  11/27/2016  Time of notification:  12:17 AM  Critical value read back:Yes.    Nurse who received alert:  Dillard Essex  MD notified (1st page):  Montey Hora, PA  Time of first page:  12:18 AM  MD notified (2nd page):  Time of second page:  Responding MD:  Montey Hora, PA  Time MD responded:  12:18 AM

## 2016-11-27 NOTE — Progress Notes (Signed)
[facility] Progress Note Patient Name: Brittany Rubio DOB: Jun 19, 1961 MRN: [id number]   Date of Service  11/27/2016  HPI/Events of Note  Follow-up with the patient. She was admitted yesterday for pneumonia/sepsis/lactic acidosis. She went into respiratory distress after getting 3 L saline with volume resuscitation.   Her dyspnea is mproved although she still remains SOB.   Blood pressure 108/56, heart rate 120, respiratory rate 30, O2 saturation 97% on BiPAP.   Levo fed drip being tapered off.   eICU Interventions  Continue BiPAP for now.  Once off levo fed and blood pressure more stable, plan to diuresce Husband updated at bedside.      Intervention Category Evaluation Type: Other  [place] 11/27/2016, 12:34 AM

## 2016-11-28 ENCOUNTER — Inpatient Hospital Stay (HOSPITAL_COMMUNITY): Payer: BC Managed Care – PPO

## 2016-11-28 LAB — BASIC METABOLIC PANEL
Anion gap: 8 (ref 5–15)
BUN: 36 mg/dL — ABNORMAL HIGH (ref 6–20)
CO2: 23 mmol/L (ref 22–32)
Calcium: 7.7 mg/dL — ABNORMAL LOW (ref 8.9–10.3)
Chloride: 102 mmol/L (ref 101–111)
Creatinine, Ser: 1.18 mg/dL — ABNORMAL HIGH (ref 0.44–1.00)
GFR calc Af Amer: 58 mL/min — ABNORMAL LOW (ref >60)
GFR calc non Af Amer: 50 mL/min — ABNORMAL LOW (ref >60)
Glucose, Bld: 97 mg/dL (ref 65–99)
Potassium: 3.5 mmol/L (ref 3.5–5.1)
Sodium: 133 mmol/L — ABNORMAL LOW (ref 135–145)

## 2016-11-28 LAB — CBC
HCT: 31.1 % — ABNORMAL LOW (ref 36.0–46.0)
Hemoglobin: 9.7 g/dL — ABNORMAL LOW (ref 12.0–15.0)
MCH: 22.5 pg — ABNORMAL LOW (ref 26.0–34.0)
MCHC: 31.2 g/dL (ref 30.0–36.0)
MCV: 72 fL — ABNORMAL LOW (ref 78.0–100.0)
Platelets: 235 10*3/uL (ref 150–400)
RBC: 4.32 MIL/uL (ref 3.87–5.11)
RDW: 17.7 % — ABNORMAL HIGH (ref 11.5–15.5)
WBC: 23.5 10*3/uL — ABNORMAL HIGH (ref 4.0–10.5)

## 2016-11-28 LAB — GLUCOSE, CAPILLARY
Glucose-Capillary: 96 mg/dL (ref 65–99)
Glucose-Capillary: 101 mg/dL — ABNORMAL HIGH (ref 65–99)
Glucose-Capillary: 112 mg/dL — ABNORMAL HIGH (ref 65–99)
Glucose-Capillary: 195 mg/dL — ABNORMAL HIGH (ref 65–99)
Glucose-Capillary: 204 mg/dL — ABNORMAL HIGH (ref 65–99)

## 2016-11-28 MED ORDER — LEVOFLOXACIN IN D5W 750 MG/150ML IV SOLN
750.0000 mg | INTRAVENOUS | Status: AC
  Administered 2016-11-28 – 2016-12-02 (×5): 750 mg via INTRAVENOUS
  Filled 2016-11-28 (×5): qty 150

## 2016-11-28 MED ORDER — ASPIRIN 81 MG PO CHEW
81.0000 mg | CHEWABLE_TABLET | Freq: Every day | ORAL | Status: DC
  Administered 2016-11-28 – 2016-12-02 (×5): 81 mg via ORAL
  Filled 2016-11-28 (×5): qty 1

## 2016-11-28 MED ORDER — BUTORPHANOL TARTRATE 10 MG/ML NA SOLN
1.0000 | NASAL | Status: DC | PRN
  Administered 2016-11-28 – 2016-12-02 (×10): 1 via NASAL
  Filled 2016-11-28: qty 2.5

## 2016-11-28 MED ORDER — LEVOTHYROXINE SODIUM 112 MCG PO TABS
112.0000 ug | ORAL_TABLET | Freq: Once | ORAL | Status: AC
  Administered 2016-11-28: 112 ug via ORAL
  Filled 2016-11-28: qty 1

## 2016-11-28 MED ORDER — LEVOTHYROXINE SODIUM 112 MCG PO TABS
224.0000 ug | ORAL_TABLET | Freq: Every day | ORAL | Status: DC
  Administered 2016-11-29 – 2016-12-02 (×3): 224 ug via ORAL
  Filled 2016-11-28 (×5): qty 2

## 2016-11-28 NOTE — Progress Notes (Signed)
   11/28/16 1944  Vitals  Temp 99.1 F (37.3 C)  Temp Source Oral  BP (!) 173/87  BP Location Left Wrist  BP Method Automatic  Patient Position (if appropriate) Sitting  Pulse Rate (!) 107  Pulse Rate Source Dinamap  Oxygen Therapy  SpO2 96 %  O2 Device Nasal Cannula  O2 Flow Rate (L/min) 4 L/min  Height and Weight  Weight 111.4 kg (245 lb 11.2 oz)  Pt transferred to rm 3E27 from 80M, pt alert and oriented, denied pain at this time, oriented to room, call bell placed within reach, placed on cardiac monitor, CCMD made aware. Family at bedside.

## 2016-11-28 NOTE — Progress Notes (Addendum)
PULMONARY / CRITICAL CARE MEDICINE   Name: Brittany Rubio MRN: [id number] DOB: Jul 01, 1961    ADMISSION DATE:  11/26/2016 CONSULTATION DATE:  11/26/2016  REFERRING MD:  Dr. Marily Memos  CHIEF COMPLAINT:  Septic Shock   HISTORY OF PRESENT ILLNESS:   56 year old female with PMH as below who was admitted on 5/14 with sepsis most likely related to CAP.  She presented to the ER with complaints of progressive shortness of breath and chest pain that started the night before.  She was found to be tachycardic at 122, tachypneic in the 40's, temp 99, and blood pressure initially normal but became hypotensive to 80/66 and hypoxic around 80% on room air.  WBC 19,900, Na 134, Cl 95, sCr 1.35, lactic acid 4.02, and D-dimer 0.97.  EKG showed sinus tachycardia.  Chest xray showed right upper lobe consolidation and small bilateral pleural effusions.  Chest CT showed right upper lobe pneumonia with significant consolidation and some patchy infiltrate in the right lower lobe; no pulmonary emboli.  She was started on Zithromax and cefepime.  She was admitted to [facility] and placed in stepdown.  While there, she was placed on BiPAP for worsening shortness of breath and increasing work of breathing.  Additionally, she became hypotensive, not fluid responsive.  PCCM was consulted and she was moved to the ICU for septic shock.  Subjective:  Off of bipap, on oxygen via [place] 5L.  States that her breathing has improved.  Denies chest pain  VITAL SIGNS: BP 138/72   Pulse (!) 101   Temp 98.5 F (36.9 C) (Oral)   Resp (!) 36   Ht 5\' 4"  (1.626 m)   Wt 248 lb 9.6 oz (112.8 kg)   SpO2 93%   BMI 42.67 kg/m   HEMODYNAMICS: CVP:  [14 mmHg] 14 mmHg  VENTILATOR SETTINGS: FiO2 (%):  [45 %-60 %] 60 %  INTAKE / OUTPUT: I/O last 3 completed shifts: In: 1738.3 [I.V.:222.3; Other:216; IV Piggyback:1300] Out: 2020 [Urine:2020]  PHYSICAL EXAMINATION: General:  Adult female, sitting in bed, no apparent distress  HEENT: MM  pink/dry, scleral anicteric  Neuro: Awake, follows commands, non focal CV: s1s2 rrr, no m/r/g PULM: no wheezes or rales.  Diffuse rhonci, no respiratory distress GI: obese, soft, non-tender, bsx4 active  Extremities: warm/dry, no edema  Skin: no rashes or lesions   LABS:  BMET  Recent Labs Lab 11/26/16 1836 11/27/16 0106 11/28/16 0230  NA 134* 132* 133*  K 3.4* 5.0 3.5  CL 99* 100* 102  CO2 20* 19* 23  BUN 22* 34* 36*  CREATININE 1.48* 1.78* 1.18*  GLUCOSE 213* 250* 97    Electrolytes  Recent Labs Lab 11/26/16 1836 11/27/16 0106 11/28/16 0230  CALCIUM 7.6* 7.4* 7.7*  MG 1.2* 1.4*  --   PHOS 3.4 5.4*  --     CBC  Recent Labs Lab 11/26/16 1836 11/27/16 0106 11/28/16 0230  WBC 15.7* 24.4* 23.5*  HGB 11.1* 9.9* 9.7*  HCT 35.3* 32.2* 31.1*  PLT 204 210 235    Coag's  Recent Labs Lab 11/26/16 1836  APTT 38*  INR 1.21    Sepsis Markers  Recent Labs Lab 11/26/16 1234 11/26/16 1836 11/26/16 2140  LATICACIDVEN 4.02* 4.1* 3.7*  PROCALCITON  --  35.53  --     ABG  Recent Labs Lab 11/26/16 1742 11/26/16 1905 11/27/16 0411  PHART 7.326* 7.339* 7.360  PCO2ART 40.6 37.7 37.4  PO2ART 68.2* 144* 145*    Liver Enzymes No results for  input(s): AST, ALT, ALKPHOS, BILITOT, ALBUMIN in the last 168 hours.  Cardiac Enzymes  Recent Labs Lab 11/26/16 2030 11/27/16 0106 11/27/16 0706  TROPONINI 0.03* 0.07* 0.25*    Glucose  Recent Labs Lab 11/27/16 1143 11/27/16 1535 11/27/16 1936 11/27/16 2330 11/28/16 0600 11/28/16 0713  GLUCAP 186* 158* 106* 86 96 101*    Imaging Dg Chest Port 1 View  Result Date: 11/28/2016 CLINICAL DATA:  Followup community acquired pneumonia EXAM: PORTABLE CHEST 1 VIEW COMPARISON:  11/27/2016 FINDINGS: Right internal jugular central line tip is at the SVC RA junction. Consolidation of the right upper lobe persists, slightly improved since yesterday. Patchy infiltrate/volume loss persists in both lower lobes.  No worsening or new finding. IMPRESSION: Very slight improvement with slight increased aeration in the right upper lobe. Patchy atelectasis/ infiltrate persists in both lower lobes. Electronically Signed   By: Nelson Chimes M.D.   On: 11/28/2016 07:14    STUDIES:  Chest CTA 5/14 >> RUL pneumonia with significant consolidation, patchy RLL inflitrate, neg for PE ECHO 5/15 >>LVEF 60-65%, normal wall thickness, normal wall motion, mild LAE, moderate RAE, normal IVC.  CULTURES: MRSA 5/14 >> [place] 5/14 >> [facility] 5/14 >> Influenza panel 5/15 >> negative  ANTIBIOTICS: ceftriaxiane 5/14 >>5/14 Cefepime 5/14 >>5/16 Levofloxacin 5/16>>> Azithromycin 5/14 >>5/16 Vancomycin 5/14 >>5/16  SIGNIFICANT EVENTS: 5/14 Admit with sepsis and CAP 5/15- NIMV, levophed on / off 5/16 - on 5L oxgyen via   LINES/TUBES: PIV  Central venous catheter RIJ 5/14   DISCUSSION:  38 yof with septic shock 2/2 right CAP on BiPAP.  Moved to ICU for progressive hypotension and respiratory distress  ASSESSMENT / PLAN:  PULMONARY A: Acute hypoxic respiratory failure Right CAP  Pulmonary edema NOT impressed P:   Wean FiO2 for sats > 94% Continue abx as below Trend CXR as needed  Duonebs prn  CARDIOVASCULAR A:  Septic shock -  Hx HTN, HLD Troponin 0.03>0.07>0.25 P:  ICU monitoring Goal map > 65 Trend CVP  Holding home HTN, HLD meds   RENAL A:   AKI- improving Lactic acidosis -s/p 3l ? Hx of CKD- normal sCr previously P:   Trend BMP, mag, phos Trend urinary output Replace electrolytes as indicated Avoid nephrotoxic agents, ensure adequate renal perfusion Lasix to hold  GASTROINTESTINAL A:   GERD P:   Protonix for SUP NPO for now   HEMATOLOGIC A:   Leukocytosis  P:  Heparin for DVT prophylaxis   INFECTIOUS A:   CAP rt Leukocytosis  Strep pneumo urinary antigen positive P:   Continue zithromax, cefepime and vanc Follow cultures - pending Trend PCT Trend lactate   ENDOCRINE A:    DM type II, blood glucose controlled Cortisol 52 Hypothyroidism s/p Thyroid carcinoma P:   SSI CBG q 4hr Continue synthroid    NEUROLOGIC A:   No acute issues  Hx depression P:   Minimize sedation Continue viibryd Hold trazadone   FAMILY  - Updates: Pts husband updated at bedside 5/16  STAFF NOTE: I, Merrie Roof, MD FACP have personally reviewed patient's available data, including medical history, events of note, physical examination and test results as part of my evaluation. I have discussed with resident/NP and other care providers such as pharmacist, RN and RRT. In addition, I personally evaluated patient and elicited key findings of: awake, alert, in chair, ronchi, edema mild, no chest pain reported, on phone talking full paragraphs, pcxr  C/w dense infiltrate about same given penetration differences, has int edema, lasix repeat  fdose, remains culture neg urine pos strep, narrow to levo monotherapy, goal 7 days abx, she has made clinical progress, dc bipap, trop mild stess induced, keep tele, follow wbc after lasix, echo assessment re assuring, asa if not contra indicated, I have managed resp failulre with above, cardiac ischemia concerns, renal failure, pulm edema, infectious disease, I updated pt in full Dc aline, dc cvl   Lavon Paganini. Titus Mould, MD, [place] Pgr: [facility] 11/28/2016 10:18 AM

## 2016-11-28 NOTE — Progress Notes (Signed)
[facility] Progress Note Patient Name: Brittany Rubio DOB: 1961/04/28 MRN: [id number]   Date of Service  11/28/2016  HPI/Events of Note  migraine  eICU Interventions  Will reorder home med of stadol for migraine.      Intervention Category Intermediate Interventions: Other:  [place] 11/28/2016, 5:11 AM

## 2016-11-29 ENCOUNTER — Inpatient Hospital Stay (HOSPITAL_COMMUNITY): Payer: BC Managed Care – PPO

## 2016-11-29 DIAGNOSIS — A403 Sepsis due to Streptococcus pneumoniae: Secondary | ICD-10-CM

## 2016-11-29 LAB — GLUCOSE, CAPILLARY
Glucose-Capillary: 160 mg/dL — ABNORMAL HIGH (ref 65–99)
Glucose-Capillary: 189 mg/dL — ABNORMAL HIGH (ref 65–99)
Glucose-Capillary: 154 mg/dL — ABNORMAL HIGH (ref 65–99)
Glucose-Capillary: 269 mg/dL — ABNORMAL HIGH (ref 65–99)

## 2016-11-29 LAB — MRSA CULTURE: Culture: NOT DETECTED

## 2016-11-29 LAB — CBC
HCT: 35.4 % — ABNORMAL LOW (ref 36.0–46.0)
Hemoglobin: 11.1 g/dL — ABNORMAL LOW (ref 12.0–15.0)
MCH: 22.8 pg — ABNORMAL LOW (ref 26.0–34.0)
MCHC: 31.4 g/dL (ref 30.0–36.0)
MCV: 72.7 fL — ABNORMAL LOW (ref 78.0–100.0)
Platelets: 266 10*3/uL (ref 150–400)
RBC: 4.87 MIL/uL (ref 3.87–5.11)
RDW: 17.5 % — ABNORMAL HIGH (ref 11.5–15.5)
WBC: 22.9 10*3/uL — ABNORMAL HIGH (ref 4.0–10.5)

## 2016-11-29 MED ORDER — SODIUM CHLORIDE 0.9 % IV BOLUS (SEPSIS)
500.0000 mL | Freq: Once | INTRAVENOUS | Status: AC
  Administered 2016-11-29: 500 mL via INTRAVENOUS

## 2016-11-29 MED ORDER — IPRATROPIUM-ALBUTEROL 0.5-2.5 (3) MG/3ML IN SOLN: 3.0000 mL | Freq: Four times a day (QID) | RESPIRATORY_TRACT | Status: DC | PRN

## 2016-11-29 MED ORDER — METOPROLOL TARTRATE 5 MG/5ML IV SOLN
5.0000 mg | INTRAVENOUS | Status: AC
  Administered 2016-11-29: 5 mg via INTRAVENOUS
  Filled 2016-11-29: qty 5

## 2016-11-29 MED ORDER — INSULIN ASPART 100 UNIT/ML ~~LOC~~ SOLN
0.0000 [IU] | Freq: Every day | SUBCUTANEOUS | Status: DC
  Administered 2016-11-29 – 2016-12-01 (×2): 3 [IU] via SUBCUTANEOUS

## 2016-11-29 MED ORDER — INSULIN ASPART 100 UNIT/ML ~~LOC~~ SOLN
0.0000 [IU] | Freq: Three times a day (TID) | SUBCUTANEOUS | Status: DC
  Administered 2016-11-29 (×2): 3 [IU] via SUBCUTANEOUS
  Administered 2016-11-30: 5 [IU] via SUBCUTANEOUS
  Administered 2016-11-30: 3 [IU] via SUBCUTANEOUS
  Administered 2016-11-30: 5 [IU] via SUBCUTANEOUS
  Administered 2016-12-01: 3 [IU] via SUBCUTANEOUS
  Administered 2016-12-01 – 2016-12-02 (×3): 5 [IU] via SUBCUTANEOUS

## 2016-11-29 MED ORDER — IPRATROPIUM-ALBUTEROL 0.5-2.5 (3) MG/3ML IN SOLN
3.0000 mL | Freq: Three times a day (TID) | RESPIRATORY_TRACT | Status: DC
  Administered 2016-11-29 – 2016-12-02 (×9): 3 mL via RESPIRATORY_TRACT
  Filled 2016-11-29 (×9): qty 3

## 2016-11-29 MED ORDER — LISINOPRIL-HYDROCHLOROTHIAZIDE 20-25 MG PO TABS: 1.0000 | ORAL_TABLET | Freq: Every day | ORAL | Status: DC

## 2016-11-29 MED ORDER — LISINOPRIL 20 MG PO TABS
20.0000 mg | ORAL_TABLET | Freq: Every day | ORAL | Status: DC
  Administered 2016-11-29 – 2016-12-02 (×4): 20 mg via ORAL
  Filled 2016-11-29 (×4): qty 1

## 2016-11-29 MED ORDER — DILTIAZEM HCL ER COATED BEADS 240 MG PO CP24
240.0000 mg | ORAL_CAPSULE | Freq: Every day | ORAL | Status: DC
  Administered 2016-11-29 – 2016-12-01 (×3): 240 mg via ORAL
  Filled 2016-11-29 (×6): qty 1

## 2016-11-29 MED ORDER — HYDROCHLOROTHIAZIDE 25 MG PO TABS
25.0000 mg | ORAL_TABLET | Freq: Every day | ORAL | Status: DC
  Administered 2016-11-29 – 2016-12-02 (×4): 25 mg via ORAL
  Filled 2016-11-29 (×4): qty 1

## 2016-11-29 MED ORDER — TRAZODONE HCL 50 MG PO TABS
50.0000 mg | ORAL_TABLET | Freq: Once | ORAL | Status: AC
  Administered 2016-11-29: 50 mg via ORAL
  Filled 2016-11-29: qty 1

## 2016-11-29 NOTE — Progress Notes (Signed)
Pt's HR 130's sustaining and will go up to 150's when walking to the bathroom , febrile at 101.48F, denies chest pain, family member at the bedside updated. Tylene Fantasia was notified. Will continue to monitor pt   11/29/16 2216  Vitals  Temp (!) 101.3 F (38.5 C)  Temp Source Oral  BP (!) 153/85  BP Location Left Arm  BP Method Automatic  Patient Position (if appropriate) Lying  Pulse Rate (!) 135  Pulse Rate Source Dinamap  Resp 20  Oxygen Therapy  SpO2 95 %  O2 Device Nasal Cannula  O2 Flow Rate (L/min) 3 L/min

## 2016-11-29 NOTE — Progress Notes (Addendum)
PROGRESS NOTE                                                                                                                                                                                                             Patient Demographics:    Brittany Rubio, is a 56 y.o. female, DOB - 03-Dec-1960, DHR:[id number]  Admit date - 11/26/2016   Admitting Physician Waldemar Dickens, MD  Outpatient Primary MD for the patient is Glendale Chard, MD  LOS - 3  Outpatient Specialists:none  Chief Complaint  Patient presents with  . Shortness of Breath       Brief Narrative  56 year old obese female with hypertension, breast malignancy, type 2 diabetes mellitus, GERD, thyroid carcinoma, hyperlipidemia admitted with sepsis secondary to multilobar pneumonia. Patient initially managing stepdown but went to respiratory failure requiring BiPAP, became hypotensive not responding to fluid challenge. PC CM consulted and she was transferred to ICU for septic shock. Patient was started on levophed and continue empiric antibiotics. Symptoms improved and transferred to hospitalist service on 5/17.   Subjective:   Still having some cough with whitish phlegm. Breathing better today. Has low-grade fever.  Assessment  & Plan :    Principal Problem: Septic shock ([place]) Secondary to multilobar pneumonia. Urine strep antigen positive. Blood cultures have been negative. Antibiotics narrowed to IV Levaquin. Leukocytosis slowly improving. Continue oxygen and wean as tolerated. Continue antitussives. DuoNeb's when necessary.   Active Problems: Multilobar pneumonia. As outlined above. Now on Levaquin (plan to treat for total 7 days). Follow-up chest x-ray in 4 weeks to ensure resolution.  Acute respiratory failure with hypoxia (HCC) Currently requiring 3L via nasal cannula. Wean as tolerated. Chest x-ray on admission also shows acute pulmonary edema  possibly due to sepsis. Will resume HCTZ. If no improvement will add Lasix.  Diabetes mellitus type 2 (HCC) Monitor on sliding-scale coverage. Holding Janumet.  Hypothyroidism Continue Synthroid.  Essential hypertension Resume HCTZ-lisinopril and Cardizem.  GERD Continue PPI   Migraine headaches On Stadol when necessary.    Code Status : Full code  Family Communication  : Discussed with husband on the phone  Disposition Plan  : Return home possibly in the next 24-48 hours if continues to improve.  Barriers For Discharge : Active symptoms  Consults  :  PC CM  Procedures  :  2-D  echo  DVT Prophylaxis  : Heparin  Lab Results  Component Value Date   PLT 266 11/29/2016    Antibiotics  :    Anti-infectives    Start     Dose/Rate Route Frequency Ordered Stop   11/28/16 1030  levofloxacin (LEVAQUIN) IVPB 750 mg     750 mg 100 mL/hr over 90 Minutes Intravenous Every 24 hours 11/28/16 1018 12/03/16 1029   11/27/16 2000  vancomycin (VANCOCIN) 1,250 mg in sodium chloride 0.9 % 250 mL IVPB  Status:  Discontinued     1,250 mg 166.7 mL/hr over 90 Minutes Intravenous Every 24 hours 11/26/16 1840 11/28/16 1019   11/27/16 1700  vancomycin (VANCOCIN) 1,250 mg in sodium chloride 0.9 % 250 mL IVPB  Status:  Discontinued     1,250 mg 166.7 mL/hr over 90 Minutes Intravenous Every 24 hours 11/26/16 1656 11/26/16 1731   11/27/16 1300  azithromycin (ZITHROMAX) 500 mg in dextrose 5 % 250 mL IVPB  Status:  Discontinued     500 mg 250 mL/hr over 60 Minutes Intravenous Every 24 hours 11/26/16 1833 11/28/16 1019   11/27/16 1200  azithromycin (ZITHROMAX) 500 mg in dextrose 5 % 250 mL IVPB  Status:  Discontinued     500 mg 250 mL/hr over 60 Minutes Intravenous Every 24 hours 11/26/16 1105 11/26/16 1555   11/27/16 1200  cefTRIAXone (ROCEPHIN) 1 g in dextrose 5 % 50 mL IVPB  Status:  Discontinued     1 g 100 mL/hr over 30 Minutes Intravenous Every 24 hours 11/26/16 1105 11/26/16 1555    11/27/16 0400  ceFEPIme (MAXIPIME) 1 g in dextrose 5 % 50 mL IVPB  Status:  Discontinued     1 g 100 mL/hr over 30 Minutes Intravenous Every 8 hours 11/26/16 1840 11/28/16 1019   11/26/16 2200  ceFEPIme (MAXIPIME) 1 g in dextrose 5 % 50 mL IVPB  Status:  Discontinued     1 g 100 mL/hr over 30 Minutes Intravenous Every 8 hours 11/26/16 1656 11/26/16 1731   11/26/16 1900  azithromycin (ZITHROMAX) 500 mg in dextrose 5 % 250 mL IVPB  Status:  Discontinued     500 mg 250 mL/hr over 60 Minutes Intravenous Every 24 hours 11/26/16 1831 11/26/16 1833   11/26/16 1845  ceFEPIme (MAXIPIME) 2 g in dextrose 5 % 50 mL IVPB     2 g 100 mL/hr over 30 Minutes Intravenous  Once 11/26/16 1837 11/26/16 2052   11/26/16 1845  vancomycin (VANCOCIN) 2,000 mg in sodium chloride 0.9 % 500 mL IVPB     2,000 mg 250 mL/hr over 120 Minutes Intravenous  Once 11/26/16 1837 11/26/16 2221   11/26/16 1600  vancomycin (VANCOCIN) 2,500 mg in sodium chloride 0.9 % 500 mL IVPB  Status:  Discontinued     2,500 mg 250 mL/hr over 120 Minutes Intravenous  Once 11/26/16 1555 11/26/16 1733   11/26/16 1600  ceFEPIme (MAXIPIME) 2 g in dextrose 5 % 50 mL IVPB  Status:  Discontinued     2 g 100 mL/hr over 30 Minutes Intravenous  Once 11/26/16 1555 11/26/16 1837   11/26/16 1545  ceFEPIme (MAXIPIME) 2 g in dextrose 5 % 50 mL IVPB  Status:  Discontinued     2 g 100 mL/hr over 30 Minutes Intravenous  Once 11/26/16 1544 11/26/16 1544   11/26/16 1545  vancomycin (VANCOCIN) IVPB 1000 mg/200 mL premix  Status:  Discontinued     1,000 mg 200 mL/hr over 60 Minutes Intravenous  Once  11/26/16 1544 11/26/16 1544   11/26/16 1115  cefTRIAXone (ROCEPHIN) 1 g in dextrose 5 % 50 mL IVPB     1 g 100 mL/hr over 30 Minutes Intravenous  Once 11/26/16 1102 11/26/16 1327   11/26/16 1115  azithromycin (ZITHROMAX) 500 mg in dextrose 5 % 250 mL IVPB     500 mg 250 mL/hr over 60 Minutes Intravenous  Once 11/26/16 1102 11/26/16 1442        Objective:    Vitals:   11/29/16 0107 11/29/16 0517 11/29/16 1007 11/29/16 1200  BP: 140/80 133/73  136/83  Pulse: (!) 110 (!) 109  98  Resp: 18 18    Temp: 99.3 F (37.4 C) 100.2 F (37.9 C) 99.3 F (37.4 C)   TempSrc: Oral Oral Oral   SpO2: 100% 97%    Weight:  109.1 kg (240 lb 8 oz)    Height:        Wt Readings from Last 3 Encounters:  11/29/16 109.1 kg (240 lb 8 oz)  03/11/15 108.9 kg (240 lb)  10/06/14 108.9 kg (240 lb)     Intake/Output Summary (Last 24 hours) at 11/29/16 1309 Last data filed at 11/29/16 1119  Gross per 24 hour  Intake             1110 ml  Output             2550 ml  Net            -1440 ml     Physical Exam  Gen.: Middle aged obese female not in distress HEENT: Moist mucosa, supple neck Chest: Diminished bilateral breath sounds CVS: Normal S1 and S2, no murmurs GI: Soft, nondistended, nontender Musculoskeletal: Warm, no edema     Data Review:    CBC  Recent Labs Lab 11/26/16 1008 11/26/16 1836 11/27/16 0106 11/28/16 0230 11/29/16 0330  WBC 19.9* 15.7* 24.4* 23.5* 22.9*  HGB 12.5 11.1* 9.9* 9.7* 11.1*  HCT 39.0 35.3* 32.2* 31.1* 35.4*  PLT 250 204 210 235 266  MCV 72.4* 73.4* 72.9* 72.0* 72.7*  MCH 23.2* 23.1* 22.4* 22.5* 22.8*  MCHC 32.1 31.4 30.7 31.2 31.4  RDW 17.8* 17.8* 17.8* 17.7* 17.5*  LYMPHSABS 1.0 1.3  --   --   --   MONOABS 0.4 0.2  --   --   --   EOSABS 0.0 0.0  --   --   --   BASOSABS 0.0 0.0  --   --   --     Chemistries   Recent Labs Lab 11/26/16 1008 11/26/16 1836 11/27/16 0106 11/28/16 0230  NA 134* 134* 132* 133*  K 3.7 3.4* 5.0 3.5  CL 95* 99* 100* 102  CO2 24 20* 19* 23  GLUCOSE 291* 213* 250* 97  BUN 17 22* 34* 36*  CREATININE 1.35* 1.48* 1.78* 1.18*  CALCIUM 8.9 7.6* 7.4* 7.7*  MG  --  1.2* 1.4*  --    ------------------------------------------------------------------------------------------------------------------ No results for input(s): CHOL, HDL, LDLCALC, TRIG, CHOLHDL, LDLDIRECT in the last  72 hours.  No results found for: HGBA1C ------------------------------------------------------------------------------------------------------------------ No results for input(s): TSH, T4TOTAL, T3FREE, THYROIDAB in the last 72 hours.  Invalid input(s): FREET3 ------------------------------------------------------------------------------------------------------------------ No results for input(s): VITAMINB12, FOLATE, FERRITIN, TIBC, IRON, RETICCTPCT in the last 72 hours.  Coagulation profile  Recent Labs Lab 11/26/16 1836  INR 1.21    No results for input(s): DDIMER in the last 72 hours.  Cardiac Enzymes  Recent Labs Lab 11/26/16 2030 11/27/16 0106 11/27/16  0706  TROPONINI 0.03* 0.07* 0.25*   ------------------------------------------------------------------------------------------------------------------    Component Value Date/Time   BNP 291.9 (H) 11/26/2016 1836    Inpatient Medications  Scheduled Meds: . aspirin  81 mg Oral Daily  . Chlorhexidine Gluconate Cloth  6 each Topical Daily  . cholecalciferol  1,000 Units Oral Daily  . cyanocobalamin  500 mcg Oral Daily  . diltiazem  240 mg Oral QHS  . guaiFENesin  1,200 mg Oral BID  . heparin  5,000 Units Subcutaneous Q8H  . lisinopril  20 mg Oral Daily   And  . hydrochlorothiazide  25 mg Oral Daily  . insulin aspart  0-15 Units Subcutaneous TID WC  . insulin aspart  0-5 Units Subcutaneous QHS  . ipratropium-albuterol  3 mL Nebulization TID  . levothyroxine  224 mcg Oral QAC breakfast  . pantoprazole  40 mg Oral Q1200  . pneumococcal 23 valent vaccine  0.5 mL Intramuscular Tomorrow-1000  . sodium chloride flush  10-40 mL Intracatheter Q12H  . sodium chloride flush  3 mL Intravenous Q12H   Continuous Infusions: . levofloxacin (LEVAQUIN) IV Stopped (11/29/16 1127)   PRN Meds:.acetaminophen **OR** acetaminophen, butorphanol, ipratropium-albuterol, morphine injection, ondansetron **OR** ondansetron (ZOFRAN) IV,  polyethylene glycol, sodium chloride flush  Micro Results Recent Results (from the past 240 hour(s))  Blood Culture (routine x 2)     Status: None (Preliminary result)   Collection Time: 11/26/16 12:10 PM  Result Value Ref Range Status   Specimen Description BLOOD LEFT ANTECUBITAL  Final   Special Requests   Final    BOTTLES DRAWN AEROBIC AND ANAEROBIC Blood Culture adequate volume   Culture NO GROWTH 3 DAYS  Final   Report Status PENDING  Incomplete  Blood Culture (routine x 2)     Status: None (Preliminary result)   Collection Time: 11/26/16 12:19 PM  Result Value Ref Range Status   Specimen Description BLOOD LEFT HAND  Final   Special Requests   Final    BOTTLES DRAWN AEROBIC ONLY Blood Culture adequate volume   Culture NO GROWTH 3 DAYS  Final   Report Status PENDING  Incomplete  MRSA PCR Screening     Status: Abnormal   Collection Time: 11/26/16  5:11 PM  Result Value Ref Range Status   MRSA by PCR INVALID RESULTS, SPECIMEN SENT FOR CULTURE (A) NEGATIVE Final    Comment:        The GeneXpert MRSA Assay (FDA approved for NASAL specimens only), is one component of a comprehensive MRSA colonization surveillance program. It is not intended to diagnose MRSA infection nor to guide or monitor treatment for MRSA infections. RESULT CALLED TO, READ BACK BY AND VERIFIED WITH: Archie Balboa RN 2008 11/26/16 A BROWNING   MRSA culture     Status: None   Collection Time: 11/26/16  5:11 PM  Result Value Ref Range Status   Specimen Description NASOPHARYNGEAL  Final   Special Requests NONE  Final   Culture NO MRSA DETECTED  Final   Report Status 11/29/2016 FINAL  Final    Radiology Reports Dg Chest 2 View  Result Date: 11/29/2016 CLINICAL DATA:  Shortness of breath.  Cough. EXAM: CHEST  2 VIEW COMPARISON:  11/28/2016 . FINDINGS: Interim removal right IJ line. Mediastinum hilar structures normal. Stable cardiomegaly. Persistent but improved right upper lobe infiltrate. Improvement of  bibasilar atelectasis. No prominent pleural effusion. No pneumothorax. IMPRESSION: 1. Interim removal right IJ line. 2. Persistent but improved right upper lobe infiltrate. Improved bibasilar atelectasis . Electronically  Signed   By: Marcello Moores  Register   On: 11/29/2016 07:16   Dg Chest 2 View  Result Date: 11/26/2016 CLINICAL DATA:  Chest pain and shortness of breath EXAM: CHEST  2 VIEW COMPARISON:  03/11/2015 FINDINGS: Normal heart size. Small pleural effusions suspected. Airspace consolidation within the right upper lobe is identified. Left lung appears clear. IMPRESSION: 1. Right upper lobe airspace consolidation is identified. In the acute setting this is compatible with pneumonia. Followup PA and lateral chest X-ray is recommended in 3-4 weeks following trial of antibiotic therapy to ensure resolution and exclude underlying malignancy. 2. Small pleural effusions. Electronically Signed   By: Kerby Moors M.D.   On: 11/26/2016 10:44   Ct Angio Chest Pe W Or Wo Contrast  Result Date: 11/26/2016 CLINICAL DATA:  Shortness of breath and right-sided chest pain EXAM: CT ANGIOGRAPHY CHEST WITH CONTRAST TECHNIQUE: Multidetector CT imaging of the chest was performed using the standard protocol during bolus administration of intravenous contrast. Multiplanar CT image reconstructions and MIPs were obtained to evaluate the vascular anatomy. CONTRAST:  80 mL Isovue 370. COMPARISON:  None. FINDINGS: Cardiovascular: The thoracic aorta is well visualized without evidence of aneurysmal dilatation or dissection. The pulmonary artery shows a normal branching pattern. No filling defects to suggest pulmonary emboli are identified. Mediastinum/Nodes: The thoracic inlet is within normal limits. Scattered small mediastinal lymph nodes are identified. No significant adenopathy is seen. Lungs/Pleura: Left lung is well aerated without focal infiltrate or sizable effusion. The right lung demonstrates considerable consolidation in  the right upper lobe similar to that seen on recent plain film examination consistent with acute pneumonia. Additionally some patchy changes are noted in the right lower lobe. No significant effusion or pneumothorax is seen. Upper Abdomen: Fatty infiltration of the liver is noted. Musculoskeletal: Degenerative changes of the thoracic spine are seen. Review of the MIP images confirms the above findings. IMPRESSION: Right upper lobe pneumonia with significant consolidation. Some patchy infiltrate is also noted in the right lower lobe. No evidence of pulmonary emboli. Electronically Signed   By: Inez Catalina M.D.   On: 11/26/2016 13:22   Dg Chest Port 1 View  Result Date: 11/28/2016 CLINICAL DATA:  Followup community acquired pneumonia EXAM: PORTABLE CHEST 1 VIEW COMPARISON:  11/27/2016 FINDINGS: Right internal jugular central line tip is at the SVC RA junction. Consolidation of the right upper lobe persists, slightly improved since yesterday. Patchy infiltrate/volume loss persists in both lower lobes. No worsening or new finding. IMPRESSION: Very slight improvement with slight increased aeration in the right upper lobe. Patchy atelectasis/ infiltrate persists in both lower lobes. Electronically Signed   By: Nelson Chimes M.D.   On: 11/28/2016 07:14   Dg Chest Port 1 View  Result Date: 11/27/2016 CLINICAL DATA:  Shortness of breath this morning. EXAM: PORTABLE CHEST 1 VIEW COMPARISON:  Yesterday at 1956 hour FINDINGS: Right internal jugular central venous catheter tip at the atrial caval junction. Previous left central line is been removed. Low lung volumes persist. Increasing right upper lobe opacity abutting the fissure. Increasing left lung base opacity. Unchanged heart size and mediastinal contours. There is vascular congestion. IMPRESSION: Findings concerning for worsening multifocal pneumonia in the right upper and left lower lobes. Electronically Signed   By: Jeb Levering M.D.   On: 11/27/2016 06:07     Dg Chest Port 1 View  Result Date: 11/26/2016 CLINICAL DATA:  Central line placement. EXAM: PORTABLE CHEST 1 VIEW COMPARISON:  Chest radiograph Nov 26, 2016 at 1923 hours  FINDINGS: New RIGHT internal jugular central venous catheter distal tip projects the cavoatrial junction. Unchanged catheter projecting LEFT chest. Cardiac silhouette is mildly enlarged. Patchy consolidation RIGHT upper lobe. No pleural effusion. No pneumothorax. Habitus limited examination. IMPRESSION: New RIGHT internal jugular central venous catheter distal tip projects the cavoatrial junction. No pneumothorax. Stable position of LEFT catheter. Mild cardiomegaly.  RIGHT upper lobe pneumonia. Electronically Signed   By: Elon Alas M.D.   On: 11/26/2016 20:19   Dg Chest Port 1 View  Result Date: 11/26/2016 CLINICAL DATA:  Central line placement EXAM: PORTABLE CHEST 1 VIEW COMPARISON:  Earlier same day FINDINGS: Left neck catheter takes an atypical course to the left of the spine. This could be within the superior intercostal vein, but aortic positioning is not excluded. Extensive pneumonia in the right lung is less densely consolidated than on the previous image. IMPRESSION: Central line atypically positioned. Whereas this could be an a venous structure such as the superior intercostal vein, arterial positioning is not excluded. These results will be called to the ordering clinician or representative by the Radiologist Assistant, and communication documented in the PACS or zVision Dashboard. Electronically Signed   By: Nelson Chimes M.D.   On: 11/26/2016 19:39   Dg Chest Port 1 View  Result Date: 11/26/2016 CLINICAL DATA:  Respiratory distress. Hx of breast cancer, GERD, hypertension, migraines, diabetes, OSA on CPAP, diaphragm surgery(1986)(trauma). Nonsmoker. EXAM: PORTABLE CHEST 1 VIEW COMPARISON:  11/26/2016 at 1552 hours FINDINGS: As noted on the earlier exam, there is right mid to lower lung zone consolidation, sparing  the right apex. This is without significant change from the prior exam allowing for differences in patient positioning and technique. Left lung remains clear. Cardiac silhouette is normal in size. No definite pleural effusion.  No pneumothorax. IMPRESSION: 1. No significant change from the earlier exam. 2. Extensive right lung consolidation consistent with pneumonia. Electronically Signed   By: Lajean Manes M.D.   On: 11/26/2016 17:59   Dg Chest Port 1 View  Result Date: 11/26/2016 CLINICAL DATA:  56 year old female with history of shortness of breath and right-sided chest pain, worsening this morning with some associated shortness of breath. EXAM: PORTABLE CHEST 1 VIEW COMPARISON:  Chest x-ray 11/26/2016. FINDINGS: Significantly worsening airspace consolidation throughout the entire right lung, concerning for progressively worsening multilobar pneumonia. Probable small right pleural effusion. Left lung is clear. No evidence of pulmonary edema. Heart size is borderline enlarged. Upper mediastinal contours are within normal limits. IMPRESSION: 1. Worsening multilobar pneumonia in the right lung involving at least the right upper and right lower lobes. 2. Probable small right parapneumonic pleural effusion. Electronically Signed   By: Vinnie Langton M.D.   On: 11/26/2016 16:14    Time Spent in minutes  25   Louellen Molder M.D on 11/29/2016 at 1:09 PM  Between 7am to 7pm - Pager - [phone]  After 7pm go to [email or web address] - password [facility]  [facility] -  Office  [phone]

## 2016-11-29 NOTE — Progress Notes (Signed)
Stopped MD in hallway to inform that pt is diabetic and order to check CBG but no sliding scale order. MD aware and will place order  Shanee Batch Leory Plowman

## 2016-11-30 LAB — GLUCOSE, CAPILLARY
Glucose-Capillary: 165 mg/dL — ABNORMAL HIGH (ref 65–99)
Glucose-Capillary: 227 mg/dL — ABNORMAL HIGH (ref 65–99)
Glucose-Capillary: 215 mg/dL — ABNORMAL HIGH (ref 65–99)
Glucose-Capillary: 136 mg/dL — ABNORMAL HIGH (ref 65–99)

## 2016-11-30 LAB — BASIC METABOLIC PANEL
Anion gap: 11 (ref 5–15)
BUN: 10 mg/dL (ref 6–20)
CO2: 30 mmol/L (ref 22–32)
Calcium: 9 mg/dL (ref 8.9–10.3)
Chloride: 95 mmol/L — ABNORMAL LOW (ref 101–111)
Creatinine, Ser: 0.75 mg/dL (ref 0.44–1.00)
GFR calc Af Amer: >60 mL/min (ref >60)
GFR calc non Af Amer: >60 mL/min (ref >60)
Glucose, Bld: 168 mg/dL — ABNORMAL HIGH (ref 65–99)
Potassium: 3.5 mmol/L (ref 3.5–5.1)
Sodium: 136 mmol/L (ref 135–145)

## 2016-11-30 LAB — CBC
HCT: 34.6 % — ABNORMAL LOW (ref 36.0–46.0)
Hemoglobin: 11.2 g/dL — ABNORMAL LOW (ref 12.0–15.0)
MCH: 23.1 pg — ABNORMAL LOW (ref 26.0–34.0)
MCHC: 32.4 g/dL (ref 30.0–36.0)
MCV: 71.5 fL — ABNORMAL LOW (ref 78.0–100.0)
Platelets: 258 10*3/uL (ref 150–400)
RBC: 4.84 MIL/uL (ref 3.87–5.11)
RDW: 17.2 % — ABNORMAL HIGH (ref 11.5–15.5)
WBC: 17.1 10*3/uL — ABNORMAL HIGH (ref 4.0–10.5)

## 2016-11-30 MED ORDER — POTASSIUM CHLORIDE CRYS ER 20 MEQ PO TBCR
40.0000 meq | EXTENDED_RELEASE_TABLET | Freq: Once | ORAL | Status: AC
  Administered 2016-11-30: 40 meq via ORAL
  Filled 2016-11-30: qty 2

## 2016-11-30 MED ORDER — GUAIFENESIN-DM 100-10 MG/5ML PO SYRP
5.0000 mL | ORAL_SOLUTION | ORAL | Status: DC | PRN
  Administered 2016-11-30 – 2016-12-01 (×3): 5 mL via ORAL
  Filled 2016-11-30 (×3): qty 5

## 2016-11-30 NOTE — Progress Notes (Signed)
PROGRESS NOTE                                                                                                                                                                                                             Patient Demographics:    Brittany Rubio, is a 56 y.o. female, DOB - Nov 26, 1960, KCM:[id number]  Admit date - 11/26/2016   Admitting Physician Waldemar Dickens, MD  Outpatient Primary MD for the patient is Glendale Chard, MD  LOS - 4  Outpatient Specialists:none  Chief Complaint  Patient presents with  . Shortness of Breath       Brief Narrative  55 year old obese female with hypertension, breast malignancy, type 2 diabetes mellitus, GERD, thyroid carcinoma, hyperlipidemia admitted with sepsis secondary to multilobar pneumonia. Patient initially managing stepdown but went to respiratory failure requiring BiPAP, became hypotensive not responding to fluid challenge. PC CM consulted and she was transferred to ICU for septic shock. Patient was started on levophed and continue empiric antibiotics. Symptoms improved and transferred to hospitalist service on 5/17.   Subjective:   Patient febrile to 101F last evening and became tachycardic up to 150s while going to the bathroom. Still having nonproductive cough.  Assessment  & Plan :    Principal Problem: Septic shock ([place]) Continue to multilobar pneumonia. Urine strep antigen positive. Blood cultures negative. Antibiotics narrowed to IV Levaquin. Still having signs of sepsis. Wbc improving. Chest x-ray from yesterday showing improved right upper lobe infiltrate. Continue supportive care. If has persistent fever obtain CT chest without abscess. Wean oxygen as tolerated. Continue antitussives and as needed DuoNeb's.   Active Problems: Multilobar pneumonia. Antibiotics narrowed to Levaquin for 7 day course. Blood cultures negative. Repeat chest x-ray from  yesterday showing improvement of right upper lobe infiltrate.  Acute respiratory failure with hypoxia ([facility]) Still requiring 3 L via nasal cannula. Wean as tolerated. Resumed HCTZ (findings of pulmonary edema on chest x-ray) 2-D echo with EF of 60-65%, no wall motion abnormality.  Diabetes mellitus type 2 ([place]) Hold Janumet. Continue sliding scale coverage.  Hypothyroidism Continue Synthroid  Essential hypertension Resume home medications.  GERD Continue PPI   Migraine headaches Stadol when necessary    Code Status : Full code  Family Communication  : Discussed with husband on the phone  Disposition Plan  : Possibly in the next  48 hours if remains afebrile and sepsis completely resolved.  Barriers For Discharge : Active symptoms  Consults  :  PC CM  Procedures  :  2-D echo  DVT Prophylaxis  : Heparin  Lab Results  Component Value Date   PLT 258 11/30/2016    Antibiotics  :    Anti-infectives    Start     Dose/Rate Route Frequency Ordered Stop   11/28/16 1030  levofloxacin (LEVAQUIN) IVPB 750 mg     750 mg 100 mL/hr over 90 Minutes Intravenous Every 24 hours 11/28/16 1018 12/03/16 1029   11/27/16 2000  vancomycin (VANCOCIN) 1,250 mg in sodium chloride 0.9 % 250 mL IVPB  Status:  Discontinued     1,250 mg 166.7 mL/hr over 90 Minutes Intravenous Every 24 hours 11/26/16 1840 11/28/16 1019   11/27/16 1700  vancomycin (VANCOCIN) 1,250 mg in sodium chloride 0.9 % 250 mL IVPB  Status:  Discontinued     1,250 mg 166.7 mL/hr over 90 Minutes Intravenous Every 24 hours 11/26/16 1656 11/26/16 1731   11/27/16 1300  azithromycin (ZITHROMAX) 500 mg in dextrose 5 % 250 mL IVPB  Status:  Discontinued     500 mg 250 mL/hr over 60 Minutes Intravenous Every 24 hours 11/26/16 1833 11/28/16 1019   11/27/16 1200  azithromycin (ZITHROMAX) 500 mg in dextrose 5 % 250 mL IVPB  Status:  Discontinued     500 mg 250 mL/hr over 60 Minutes Intravenous Every 24 hours 11/26/16 1105 11/26/16  1555   11/27/16 1200  cefTRIAXone (ROCEPHIN) 1 g in dextrose 5 % 50 mL IVPB  Status:  Discontinued     1 g 100 mL/hr over 30 Minutes Intravenous Every 24 hours 11/26/16 1105 11/26/16 1555   11/27/16 0400  ceFEPIme (MAXIPIME) 1 g in dextrose 5 % 50 mL IVPB  Status:  Discontinued     1 g 100 mL/hr over 30 Minutes Intravenous Every 8 hours 11/26/16 1840 11/28/16 1019   11/26/16 2200  ceFEPIme (MAXIPIME) 1 g in dextrose 5 % 50 mL IVPB  Status:  Discontinued     1 g 100 mL/hr over 30 Minutes Intravenous Every 8 hours 11/26/16 1656 11/26/16 1731   11/26/16 1900  azithromycin (ZITHROMAX) 500 mg in dextrose 5 % 250 mL IVPB  Status:  Discontinued     500 mg 250 mL/hr over 60 Minutes Intravenous Every 24 hours 11/26/16 1831 11/26/16 1833   11/26/16 1845  ceFEPIme (MAXIPIME) 2 g in dextrose 5 % 50 mL IVPB     2 g 100 mL/hr over 30 Minutes Intravenous  Once 11/26/16 1837 11/26/16 2052   11/26/16 1845  vancomycin (VANCOCIN) 2,000 mg in sodium chloride 0.9 % 500 mL IVPB     2,000 mg 250 mL/hr over 120 Minutes Intravenous  Once 11/26/16 1837 11/26/16 2221   11/26/16 1600  vancomycin (VANCOCIN) 2,500 mg in sodium chloride 0.9 % 500 mL IVPB  Status:  Discontinued     2,500 mg 250 mL/hr over 120 Minutes Intravenous  Once 11/26/16 1555 11/26/16 1733   11/26/16 1600  ceFEPIme (MAXIPIME) 2 g in dextrose 5 % 50 mL IVPB  Status:  Discontinued     2 g 100 mL/hr over 30 Minutes Intravenous  Once 11/26/16 1555 11/26/16 1837   11/26/16 1545  ceFEPIme (MAXIPIME) 2 g in dextrose 5 % 50 mL IVPB  Status:  Discontinued     2 g 100 mL/hr over 30 Minutes Intravenous  Once 11/26/16 1544 11/26/16 1544  11/26/16 1545  vancomycin (VANCOCIN) IVPB 1000 mg/200 mL premix  Status:  Discontinued     1,000 mg 200 mL/hr over 60 Minutes Intravenous  Once 11/26/16 1544 11/26/16 1544   11/26/16 1115  cefTRIAXone (ROCEPHIN) 1 g in dextrose 5 % 50 mL IVPB     1 g 100 mL/hr over 30 Minutes Intravenous  Once 11/26/16 1102 11/26/16  1327   11/26/16 1115  azithromycin (ZITHROMAX) 500 mg in dextrose 5 % 250 mL IVPB     500 mg 250 mL/hr over 60 Minutes Intravenous  Once 11/26/16 1102 11/26/16 1442        Objective:   Vitals:   11/30/16 0524 11/30/16 0818 11/30/16 1000 11/30/16 1008  BP: (!) 150/84  (!) 150/91 (!) 152/75  Pulse: 98  97 96  Resp: 20  18 18   Temp: 98.5 F (36.9 C)   99 F (37.2 C)  TempSrc: Oral     SpO2: 99% 93% 100%   Weight: 108.3 kg (238 lb 11.2 oz)     Height:        Wt Readings from Last 3 Encounters:  11/30/16 108.3 kg (238 lb 11.2 oz)  03/11/15 108.9 kg (240 lb)  10/06/14 108.9 kg (240 lb)     Intake/Output Summary (Last 24 hours) at 11/30/16 1133 Last data filed at 11/30/16 0530  Gross per 24 hour  Intake              720 ml  Output              800 ml  Net              -80 ml     Physical Exam Gen.: Elderly aged obese female not in distress, appears fatigued HEENT: Moist mucous, supple neck Chest: Right basilar crackles, no rhonchi or wheeze CVS: Normal S1 and S2, no murmurs GI: Soft, nondistended, nontender Musculoskeletal: Warm, no edema     Data Review:    CBC  Recent Labs Lab 11/26/16 1008 11/26/16 1836 11/27/16 0106 11/28/16 0230 11/29/16 0330 11/30/16 0514  WBC 19.9* 15.7* 24.4* 23.5* 22.9* 17.1*  HGB 12.5 11.1* 9.9* 9.7* 11.1* 11.2*  HCT 39.0 35.3* 32.2* 31.1* 35.4* 34.6*  PLT 250 204 210 235 266 258  MCV 72.4* 73.4* 72.9* 72.0* 72.7* 71.5*  MCH 23.2* 23.1* 22.4* 22.5* 22.8* 23.1*  MCHC 32.1 31.4 30.7 31.2 31.4 32.4  RDW 17.8* 17.8* 17.8* 17.7* 17.5* 17.2*  LYMPHSABS 1.0 1.3  --   --   --   --   MONOABS 0.4 0.2  --   --   --   --   EOSABS 0.0 0.0  --   --   --   --   BASOSABS 0.0 0.0  --   --   --   --     Chemistries   Recent Labs Lab 11/26/16 1008 11/26/16 1836 11/27/16 0106 11/28/16 0230 11/30/16 0514  NA 134* 134* 132* 133* 136  K 3.7 3.4* 5.0 3.5 3.5  CL 95* 99* 100* 102 95*  CO2 24 20* 19* 23 30  GLUCOSE 291* 213* 250* 97  168*  BUN 17 22* 34* 36* 10  CREATININE 1.35* 1.48* 1.78* 1.18* 0.75  CALCIUM 8.9 7.6* 7.4* 7.7* 9.0  MG  --  1.2* 1.4*  --   --    ------------------------------------------------------------------------------------------------------------------ No results for input(s): CHOL, HDL, LDLCALC, TRIG, CHOLHDL, LDLDIRECT in the last 72 hours.  No results found for: HGBA1C ------------------------------------------------------------------------------------------------------------------ No  results for input(s): TSH, T4TOTAL, T3FREE, THYROIDAB in the last 72 hours.  Invalid input(s): FREET3 ------------------------------------------------------------------------------------------------------------------ No results for input(s): VITAMINB12, FOLATE, FERRITIN, TIBC, IRON, RETICCTPCT in the last 72 hours.  Coagulation profile  Recent Labs Lab 11/26/16 1836  INR 1.21    No results for input(s): DDIMER in the last 72 hours.  Cardiac Enzymes  Recent Labs Lab 11/26/16 2030 11/27/16 0106 11/27/16 0706  TROPONINI 0.03* 0.07* 0.25*   ------------------------------------------------------------------------------------------------------------------    Component Value Date/Time   BNP 291.9 (H) 11/26/2016 1836    Inpatient Medications  Scheduled Meds: . aspirin  81 mg Oral Daily  . Chlorhexidine Gluconate Cloth  6 each Topical Daily  . cholecalciferol  1,000 Units Oral Daily  . cyanocobalamin  500 mcg Oral Daily  . diltiazem  240 mg Oral QHS  . guaiFENesin  1,200 mg Oral BID  . heparin  5,000 Units Subcutaneous Q8H  . lisinopril  20 mg Oral Daily   And  . hydrochlorothiazide  25 mg Oral Daily  . insulin aspart  0-15 Units Subcutaneous TID WC  . insulin aspart  0-5 Units Subcutaneous QHS  . ipratropium-albuterol  3 mL Nebulization TID  . levothyroxine  224 mcg Oral QAC breakfast  . pantoprazole  40 mg Oral Q1200  . pneumococcal 23 valent vaccine  0.5 mL Intramuscular Tomorrow-1000    . sodium chloride flush  10-40 mL Intracatheter Q12H  . sodium chloride flush  3 mL Intravenous Q12H   Continuous Infusions: . levofloxacin (LEVAQUIN) IV 750 mg (11/30/16 1013)   PRN Meds:.acetaminophen **OR** acetaminophen, butorphanol, ipratropium-albuterol, ondansetron **OR** ondansetron (ZOFRAN) IV, polyethylene glycol, sodium chloride flush  Micro Results Recent Results (from the past 240 hour(s))  Blood Culture (routine x 2)     Status: None (Preliminary result)   Collection Time: 11/26/16 12:10 PM  Result Value Ref Range Status   Specimen Description BLOOD LEFT ANTECUBITAL  Final   Special Requests   Final    BOTTLES DRAWN AEROBIC AND ANAEROBIC Blood Culture adequate volume   Culture NO GROWTH 3 DAYS  Final   Report Status PENDING  Incomplete  Blood Culture (routine x 2)     Status: None (Preliminary result)   Collection Time: 11/26/16 12:19 PM  Result Value Ref Range Status   Specimen Description BLOOD LEFT HAND  Final   Special Requests   Final    BOTTLES DRAWN AEROBIC ONLY Blood Culture adequate volume   Culture NO GROWTH 3 DAYS  Final   Report Status PENDING  Incomplete  MRSA PCR Screening     Status: Abnormal   Collection Time: 11/26/16  5:11 PM  Result Value Ref Range Status   MRSA by PCR INVALID RESULTS, SPECIMEN SENT FOR CULTURE (A) NEGATIVE Final    Comment:        The GeneXpert MRSA Assay (FDA approved for NASAL specimens only), is one component of a comprehensive MRSA colonization surveillance program. It is not intended to diagnose MRSA infection nor to guide or monitor treatment for MRSA infections. RESULT CALLED TO, READ BACK BY AND VERIFIED WITH: Archie Balboa RN 2008 11/26/16 A BROWNING   MRSA culture     Status: None   Collection Time: 11/26/16  5:11 PM  Result Value Ref Range Status   Specimen Description NASOPHARYNGEAL  Final   Special Requests NONE  Final   Culture NO MRSA DETECTED  Final   Report Status 11/29/2016 FINAL  Final    Radiology  Reports Dg Chest 2 View  Result Date: 11/29/2016 CLINICAL DATA:  Shortness of breath.  Cough. EXAM: CHEST  2 VIEW COMPARISON:  11/28/2016 . FINDINGS: Interim removal right IJ line. Mediastinum hilar structures normal. Stable cardiomegaly. Persistent but improved right upper lobe infiltrate. Improvement of bibasilar atelectasis. No prominent pleural effusion. No pneumothorax. IMPRESSION: 1. Interim removal right IJ line. 2. Persistent but improved right upper lobe infiltrate. Improved bibasilar atelectasis . Electronically Signed   By: Marcello Moores  Register   On: 11/29/2016 07:16   Dg Chest 2 View  Result Date: 11/26/2016 CLINICAL DATA:  Chest pain and shortness of breath EXAM: CHEST  2 VIEW COMPARISON:  03/11/2015 FINDINGS: Normal heart size. Small pleural effusions suspected. Airspace consolidation within the right upper lobe is identified. Left lung appears clear. IMPRESSION: 1. Right upper lobe airspace consolidation is identified. In the acute setting this is compatible with pneumonia. Followup PA and lateral chest X-ray is recommended in 3-4 weeks following trial of antibiotic therapy to ensure resolution and exclude underlying malignancy. 2. Small pleural effusions. Electronically Signed   By: Kerby Moors M.D.   On: 11/26/2016 10:44   Ct Angio Chest Pe W Or Wo Contrast  Result Date: 11/26/2016 CLINICAL DATA:  Shortness of breath and right-sided chest pain EXAM: CT ANGIOGRAPHY CHEST WITH CONTRAST TECHNIQUE: Multidetector CT imaging of the chest was performed using the standard protocol during bolus administration of intravenous contrast. Multiplanar CT image reconstructions and MIPs were obtained to evaluate the vascular anatomy. CONTRAST:  80 mL Isovue 370. COMPARISON:  None. FINDINGS: Cardiovascular: The thoracic aorta is well visualized without evidence of aneurysmal dilatation or dissection. The pulmonary artery shows a normal branching pattern. No filling defects to suggest pulmonary emboli are  identified. Mediastinum/Nodes: The thoracic inlet is within normal limits. Scattered small mediastinal lymph nodes are identified. No significant adenopathy is seen. Lungs/Pleura: Left lung is well aerated without focal infiltrate or sizable effusion. The right lung demonstrates considerable consolidation in the right upper lobe similar to that seen on recent plain film examination consistent with acute pneumonia. Additionally some patchy changes are noted in the right lower lobe. No significant effusion or pneumothorax is seen. Upper Abdomen: Fatty infiltration of the liver is noted. Musculoskeletal: Degenerative changes of the thoracic spine are seen. Review of the MIP images confirms the above findings. IMPRESSION: Right upper lobe pneumonia with significant consolidation. Some patchy infiltrate is also noted in the right lower lobe. No evidence of pulmonary emboli. Electronically Signed   By: Inez Catalina M.D.   On: 11/26/2016 13:22   Dg Chest Port 1 View  Result Date: 11/28/2016 CLINICAL DATA:  Followup community acquired pneumonia EXAM: PORTABLE CHEST 1 VIEW COMPARISON:  11/27/2016 FINDINGS: Right internal jugular central line tip is at the SVC RA junction. Consolidation of the right upper lobe persists, slightly improved since yesterday. Patchy infiltrate/volume loss persists in both lower lobes. No worsening or new finding. IMPRESSION: Very slight improvement with slight increased aeration in the right upper lobe. Patchy atelectasis/ infiltrate persists in both lower lobes. Electronically Signed   By: Nelson Chimes M.D.   On: 11/28/2016 07:14   Dg Chest Port 1 View  Result Date: 11/27/2016 CLINICAL DATA:  Shortness of breath this morning. EXAM: PORTABLE CHEST 1 VIEW COMPARISON:  Yesterday at 1956 hour FINDINGS: Right internal jugular central venous catheter tip at the atrial caval junction. Previous left central line is been removed. Low lung volumes persist. Increasing right upper lobe opacity  abutting the fissure. Increasing left lung base opacity. Unchanged heart size  and mediastinal contours. There is vascular congestion. IMPRESSION: Findings concerning for worsening multifocal pneumonia in the right upper and left lower lobes. Electronically Signed   By: Jeb Levering M.D.   On: 11/27/2016 06:07   Dg Chest Port 1 View  Result Date: 11/26/2016 CLINICAL DATA:  Central line placement. EXAM: PORTABLE CHEST 1 VIEW COMPARISON:  Chest radiograph Nov 26, 2016 at 1923 hours FINDINGS: New RIGHT internal jugular central venous catheter distal tip projects the cavoatrial junction. Unchanged catheter projecting LEFT chest. Cardiac silhouette is mildly enlarged. Patchy consolidation RIGHT upper lobe. No pleural effusion. No pneumothorax. Habitus limited examination. IMPRESSION: New RIGHT internal jugular central venous catheter distal tip projects the cavoatrial junction. No pneumothorax. Stable position of LEFT catheter. Mild cardiomegaly.  RIGHT upper lobe pneumonia. Electronically Signed   By: Elon Alas M.D.   On: 11/26/2016 20:19   Dg Chest Port 1 View  Result Date: 11/26/2016 CLINICAL DATA:  Central line placement EXAM: PORTABLE CHEST 1 VIEW COMPARISON:  Earlier same day FINDINGS: Left neck catheter takes an atypical course to the left of the spine. This could be within the superior intercostal vein, but aortic positioning is not excluded. Extensive pneumonia in the right lung is less densely consolidated than on the previous image. IMPRESSION: Central line atypically positioned. Whereas this could be an a venous structure such as the superior intercostal vein, arterial positioning is not excluded. These results will be called to the ordering clinician or representative by the Radiologist Assistant, and communication documented in the PACS or zVision Dashboard. Electronically Signed   By: Nelson Chimes M.D.   On: 11/26/2016 19:39   Dg Chest Port 1 View  Result Date: 11/26/2016 CLINICAL  DATA:  Respiratory distress. Hx of breast cancer, GERD, hypertension, migraines, diabetes, OSA on CPAP, diaphragm surgery(1986)(trauma). Nonsmoker. EXAM: PORTABLE CHEST 1 VIEW COMPARISON:  11/26/2016 at 1552 hours FINDINGS: As noted on the earlier exam, there is right mid to lower lung zone consolidation, sparing the right apex. This is without significant change from the prior exam allowing for differences in patient positioning and technique. Left lung remains clear. Cardiac silhouette is normal in size. No definite pleural effusion.  No pneumothorax. IMPRESSION: 1. No significant change from the earlier exam. 2. Extensive right lung consolidation consistent with pneumonia. Electronically Signed   By: Lajean Manes M.D.   On: 11/26/2016 17:59   Dg Chest Port 1 View  Result Date: 11/26/2016 CLINICAL DATA:  56 year old female with history of shortness of breath and right-sided chest pain, worsening this morning with some associated shortness of breath. EXAM: PORTABLE CHEST 1 VIEW COMPARISON:  Chest x-ray 11/26/2016. FINDINGS: Significantly worsening airspace consolidation throughout the entire right lung, concerning for progressively worsening multilobar pneumonia. Probable small right pleural effusion. Left lung is clear. No evidence of pulmonary edema. Heart size is borderline enlarged. Upper mediastinal contours are within normal limits. IMPRESSION: 1. Worsening multilobar pneumonia in the right lung involving at least the right upper and right lower lobes. 2. Probable small right parapneumonic pleural effusion. Electronically Signed   By: Vinnie Langton M.D.   On: 11/26/2016 16:14    Time Spent in minutes  35   Louellen Molder M.D on 11/30/2016 at 11:33 AM  Between 7am to 7pm - Pager - [phone]  After 7pm go to [email or web address] - password [facility]  [facility] -  Office  [phone]

## 2016-12-01 LAB — CULTURE, BLOOD (ROUTINE X 2)
Culture: NO GROWTH
Culture: NO GROWTH
Special Requests: ADEQUATE
Special Requests: ADEQUATE

## 2016-12-01 LAB — GLUCOSE, CAPILLARY
Glucose-Capillary: 151 mg/dL — ABNORMAL HIGH (ref 65–99)
Glucose-Capillary: 203 mg/dL — ABNORMAL HIGH (ref 65–99)
Glucose-Capillary: 259 mg/dL — ABNORMAL HIGH (ref 65–99)

## 2016-12-01 LAB — CBC
HCT: 33.5 % — ABNORMAL LOW (ref 36.0–46.0)
Hemoglobin: 10.6 g/dL — ABNORMAL LOW (ref 12.0–15.0)
MCH: 22.5 pg — ABNORMAL LOW (ref 26.0–34.0)
MCHC: 31.6 g/dL (ref 30.0–36.0)
MCV: 71 fL — ABNORMAL LOW (ref 78.0–100.0)
Platelets: 266 10*3/uL (ref 150–400)
RBC: 4.72 MIL/uL (ref 3.87–5.11)
RDW: 17.1 % — ABNORMAL HIGH (ref 11.5–15.5)
WBC: 17.8 10*3/uL — ABNORMAL HIGH (ref 4.0–10.5)

## 2016-12-01 MED ORDER — BENZONATATE 100 MG PO CAPS: 100.0000 mg | ORAL_CAPSULE | Freq: Three times a day (TID) | ORAL | Status: DC | PRN

## 2016-12-01 MED ORDER — HYDRALAZINE HCL 20 MG/ML IJ SOLN
5.0000 mg | Freq: Four times a day (QID) | INTRAMUSCULAR | Status: DC | PRN
  Administered 2016-12-01: 5 mg via INTRAVENOUS
  Filled 2016-12-01: qty 1

## 2016-12-01 MED ORDER — TRAZODONE HCL 50 MG PO TABS
50.0000 mg | ORAL_TABLET | Freq: Once | ORAL | Status: AC
  Administered 2016-12-01: 50 mg via ORAL
  Filled 2016-12-01: qty 1

## 2016-12-01 MED ORDER — ZOLPIDEM TARTRATE 5 MG PO TABS: 5.0000 mg | ORAL_TABLET | Freq: Once | ORAL | Status: DC

## 2016-12-01 MED ORDER — HYDROCODONE-HOMATROPINE 5-1.5 MG/5ML PO SYRP
5.0000 mL | ORAL_SOLUTION | ORAL | Status: AC | PRN
  Administered 2016-12-01 – 2016-12-02 (×2): 5 mL via ORAL
  Filled 2016-12-01 (×2): qty 5

## 2016-12-01 NOTE — Progress Notes (Signed)
PROGRESS NOTE                                                                                                                                                                                                             Patient Demographics:    Brittany Rubio, is a 56 y.o. female, DOB - Nov 21, 1960, SEG:[id number]  Admit date - 11/26/2016   Admitting Physician Waldemar Dickens, MD  Outpatient Primary MD for the patient is Glendale Chard, MD  LOS - 5  Outpatient Specialists:none  Chief Complaint  Patient presents with  . Shortness of Breath       Brief Narrative  56 year old obese female with hypertension, breast malignancy, type 2 diabetes mellitus, GERD, thyroid carcinoma, hyperlipidemia admitted with sepsis secondary to multilobar pneumonia. Patient initially managing stepdown but went to respiratory failure requiring BiPAP, became hypotensive not responding to fluid challenge. PC CM consulted and she was transferred to ICU for septic shock. Patient was started on levophed and continue empiric antibiotics. Symptoms improved and transferred to hospitalist service on 5/17.   Subjective:   Symptoms improving. Has low-grade fever..  Assessment  & Plan :    Principal Problem: Septic shock ([place]) Secondary to multilobar pneumonia. Urine strep antigen positive. Antibiotics narrowed to Levaquin (total 7-10 day course). Blood cultures negative. -Sepsis resolving. As low-grade fever and leukocytosis. Repeat chest x-ray from 5/17 shows improving right upper lobe infiltrate. -Supportive care with Tylenol and antitussives. When necessary DuoNeb's. Wean oxygen as tolerated.   If she remains afebrile and improving can be discharged home tomorrow. Will need  Active Problems: Multilobar pneumonia. Antibiotics narrowed to Levaquin for 7 day course. Blood cultures negative. Repeat chest x-ray from yesterday showing improvement of  right upper lobe infiltrate.  Acute respiratory failure with hypoxia ([facility]) Secondary to sepsis with pneumonia and mild pulmonary edema on admission. Wean oxygen as tolerated. Resumed HCTZ. 2-D echo with EF of 60-65%, no wall motion abnormality.  Diabetes mellitus type 2 ([place]) Holding Janumet. Continue sliding scale coverage.  Hypothyroidism Continue Synthroid  Essential hypertension Resumed home medications.  GERD Continue PPI   Migraine headaches Stadol when necessary    Code Status : Full code  Family Communication  : Discussed with husband on the phone  Disposition Plan  : Home Possibly tomorrow if remains afebrile and improving.   Barriers For Discharge :  Active symptoms  Consults  :  PC CM  Procedures  :  2-D echo  DVT Prophylaxis  : Heparin  Lab Results  Component Value Date   PLT 266 12/01/2016    Antibiotics  :    Anti-infectives    Start     Dose/Rate Route Frequency Ordered Stop   11/28/16 1030  levofloxacin (LEVAQUIN) IVPB 750 mg     750 mg 100 mL/hr over 90 Minutes Intravenous Every 24 hours 11/28/16 1018 12/03/16 1029   11/27/16 2000  vancomycin (VANCOCIN) 1,250 mg in sodium chloride 0.9 % 250 mL IVPB  Status:  Discontinued     1,250 mg 166.7 mL/hr over 90 Minutes Intravenous Every 24 hours 11/26/16 1840 11/28/16 1019   11/27/16 1700  vancomycin (VANCOCIN) 1,250 mg in sodium chloride 0.9 % 250 mL IVPB  Status:  Discontinued     1,250 mg 166.7 mL/hr over 90 Minutes Intravenous Every 24 hours 11/26/16 1656 11/26/16 1731   11/27/16 1300  azithromycin (ZITHROMAX) 500 mg in dextrose 5 % 250 mL IVPB  Status:  Discontinued     500 mg 250 mL/hr over 60 Minutes Intravenous Every 24 hours 11/26/16 1833 11/28/16 1019   11/27/16 1200  azithromycin (ZITHROMAX) 500 mg in dextrose 5 % 250 mL IVPB  Status:  Discontinued     500 mg 250 mL/hr over 60 Minutes Intravenous Every 24 hours 11/26/16 1105 11/26/16 1555   11/27/16 1200  cefTRIAXone (ROCEPHIN) 1 g in  dextrose 5 % 50 mL IVPB  Status:  Discontinued     1 g 100 mL/hr over 30 Minutes Intravenous Every 24 hours 11/26/16 1105 11/26/16 1555   11/27/16 0400  ceFEPIme (MAXIPIME) 1 g in dextrose 5 % 50 mL IVPB  Status:  Discontinued     1 g 100 mL/hr over 30 Minutes Intravenous Every 8 hours 11/26/16 1840 11/28/16 1019   11/26/16 2200  ceFEPIme (MAXIPIME) 1 g in dextrose 5 % 50 mL IVPB  Status:  Discontinued     1 g 100 mL/hr over 30 Minutes Intravenous Every 8 hours 11/26/16 1656 11/26/16 1731   11/26/16 1900  azithromycin (ZITHROMAX) 500 mg in dextrose 5 % 250 mL IVPB  Status:  Discontinued     500 mg 250 mL/hr over 60 Minutes Intravenous Every 24 hours 11/26/16 1831 11/26/16 1833   11/26/16 1845  ceFEPIme (MAXIPIME) 2 g in dextrose 5 % 50 mL IVPB     2 g 100 mL/hr over 30 Minutes Intravenous  Once 11/26/16 1837 11/26/16 2052   11/26/16 1845  vancomycin (VANCOCIN) 2,000 mg in sodium chloride 0.9 % 500 mL IVPB     2,000 mg 250 mL/hr over 120 Minutes Intravenous  Once 11/26/16 1837 11/26/16 2221   11/26/16 1600  vancomycin (VANCOCIN) 2,500 mg in sodium chloride 0.9 % 500 mL IVPB  Status:  Discontinued     2,500 mg 250 mL/hr over 120 Minutes Intravenous  Once 11/26/16 1555 11/26/16 1733   11/26/16 1600  ceFEPIme (MAXIPIME) 2 g in dextrose 5 % 50 mL IVPB  Status:  Discontinued     2 g 100 mL/hr over 30 Minutes Intravenous  Once 11/26/16 1555 11/26/16 1837   11/26/16 1545  ceFEPIme (MAXIPIME) 2 g in dextrose 5 % 50 mL IVPB  Status:  Discontinued     2 g 100 mL/hr over 30 Minutes Intravenous  Once 11/26/16 1544 11/26/16 1544   11/26/16 1545  vancomycin (VANCOCIN) IVPB 1000 mg/200 mL premix  Status:  Discontinued     1,000 mg 200 mL/hr over 60 Minutes Intravenous  Once 11/26/16 1544 11/26/16 1544   11/26/16 1115  cefTRIAXone (ROCEPHIN) 1 g in dextrose 5 % 50 mL IVPB     1 g 100 mL/hr over 30 Minutes Intravenous  Once 11/26/16 1102 11/26/16 1327   11/26/16 1115  azithromycin (ZITHROMAX) 500 mg  in dextrose 5 % 250 mL IVPB     500 mg 250 mL/hr over 60 Minutes Intravenous  Once 11/26/16 1102 11/26/16 1442        Objective:   Vitals:   11/30/16 1710 11/30/16 2031 12/01/16 0358 12/01/16 0937  BP:  (!) 149/71 136/80   Pulse:  97 (!) 101   Resp:  17 20   Temp:  98.4 F (36.9 C) 99.5 F (37.5 C)   TempSrc:  Oral Oral   SpO2: 100% 96% 99% 99%  Weight:   107.9 kg (237 lb 14.4 oz)   Height:        Wt Readings from Last 3 Encounters:  12/01/16 107.9 kg (237 lb 14.4 oz)  03/11/15 108.9 kg (240 lb)  10/06/14 108.9 kg (240 lb)     Intake/Output Summary (Last 24 hours) at 12/01/16 1131 Last data filed at 12/01/16 0402  Gross per 24 hour  Intake             1110 ml  Output             1800 ml  Net             -690 ml     Physical Exam Gen.: Obese female not in distress HEENT: Moist mucosa, supple neck Chest: Fine bibasilar crackles (improving), no rhonchi or wheeze CVS: Normal S1 and S2, no murmurs GI: Soft, nondistended, nontender Musculoskeletal: Warm, no edema      Data Review:    CBC  Recent Labs Lab 11/26/16 1008 11/26/16 1836 11/27/16 0106 11/28/16 0230 11/29/16 0330 11/30/16 0514 12/01/16 0352  WBC 19.9* 15.7* 24.4* 23.5* 22.9* 17.1* 17.8*  HGB 12.5 11.1* 9.9* 9.7* 11.1* 11.2* 10.6*  HCT 39.0 35.3* 32.2* 31.1* 35.4* 34.6* 33.5*  PLT 250 204 210 235 266 258 266  MCV 72.4* 73.4* 72.9* 72.0* 72.7* 71.5* 71.0*  MCH 23.2* 23.1* 22.4* 22.5* 22.8* 23.1* 22.5*  MCHC 32.1 31.4 30.7 31.2 31.4 32.4 31.6  RDW 17.8* 17.8* 17.8* 17.7* 17.5* 17.2* 17.1*  LYMPHSABS 1.0 1.3  --   --   --   --   --   MONOABS 0.4 0.2  --   --   --   --   --   EOSABS 0.0 0.0  --   --   --   --   --   BASOSABS 0.0 0.0  --   --   --   --   --     Chemistries   Recent Labs Lab 11/26/16 1008 11/26/16 1836 11/27/16 0106 11/28/16 0230 11/30/16 0514  NA 134* 134* 132* 133* 136  K 3.7 3.4* 5.0 3.5 3.5  CL 95* 99* 100* 102 95*  CO2 24 20* 19* 23 30  GLUCOSE 291* 213* 250*  97 168*  BUN 17 22* 34* 36* 10  CREATININE 1.35* 1.48* 1.78* 1.18* 0.75  CALCIUM 8.9 7.6* 7.4* 7.7* 9.0  MG  --  1.2* 1.4*  --   --    ------------------------------------------------------------------------------------------------------------------ No results for input(s): CHOL, HDL, LDLCALC, TRIG, CHOLHDL, LDLDIRECT in the last 72 hours.  No results found for:  HGBA1C ------------------------------------------------------------------------------------------------------------------ No results for input(s): TSH, T4TOTAL, T3FREE, THYROIDAB in the last 72 hours.  Invalid input(s): FREET3 ------------------------------------------------------------------------------------------------------------------ No results for input(s): VITAMINB12, FOLATE, FERRITIN, TIBC, IRON, RETICCTPCT in the last 72 hours.  Coagulation profile  Recent Labs Lab 11/26/16 1836  INR 1.21    No results for input(s): DDIMER in the last 72 hours.  Cardiac Enzymes  Recent Labs Lab 11/26/16 2030 11/27/16 0106 11/27/16 0706  TROPONINI 0.03* 0.07* 0.25*   ------------------------------------------------------------------------------------------------------------------    Component Value Date/Time   BNP 291.9 (H) 11/26/2016 1836    Inpatient Medications  Scheduled Meds: . aspirin  81 mg Oral Daily  . Chlorhexidine Gluconate Cloth  6 each Topical Daily  . cholecalciferol  1,000 Units Oral Daily  . cyanocobalamin  500 mcg Oral Daily  . diltiazem  240 mg Oral QHS  . guaiFENesin  1,200 mg Oral BID  . heparin  5,000 Units Subcutaneous Q8H  . lisinopril  20 mg Oral Daily   And  . hydrochlorothiazide  25 mg Oral Daily  . insulin aspart  0-15 Units Subcutaneous TID WC  . insulin aspart  0-5 Units Subcutaneous QHS  . ipratropium-albuterol  3 mL Nebulization TID  . levothyroxine  224 mcg Oral QAC breakfast  . pantoprazole  40 mg Oral Q1200  . pneumococcal 23 valent vaccine  0.5 mL Intramuscular  Tomorrow-1000  . sodium chloride flush  10-40 mL Intracatheter Q12H  . sodium chloride flush  3 mL Intravenous Q12H   Continuous Infusions: . levofloxacin (LEVAQUIN) IV Stopped (11/30/16 1143)   PRN Meds:.acetaminophen **OR** acetaminophen, butorphanol, guaiFENesin-dextromethorphan, ipratropium-albuterol, ondansetron **OR** ondansetron (ZOFRAN) IV, polyethylene glycol, sodium chloride flush  Micro Results Recent Results (from the past 240 hour(s))  Blood Culture (routine x 2)     Status: None (Preliminary result)   Collection Time: 11/26/16 12:10 PM  Result Value Ref Range Status   Specimen Description BLOOD LEFT ANTECUBITAL  Final   Special Requests   Final    BOTTLES DRAWN AEROBIC AND ANAEROBIC Blood Culture adequate volume   Culture NO GROWTH 4 DAYS  Final   Report Status PENDING  Incomplete  Blood Culture (routine x 2)     Status: None (Preliminary result)   Collection Time: 11/26/16 12:19 PM  Result Value Ref Range Status   Specimen Description BLOOD LEFT HAND  Final   Special Requests   Final    BOTTLES DRAWN AEROBIC ONLY Blood Culture adequate volume   Culture NO GROWTH 4 DAYS  Final   Report Status PENDING  Incomplete  MRSA PCR Screening     Status: Abnormal   Collection Time: 11/26/16  5:11 PM  Result Value Ref Range Status   MRSA by PCR INVALID RESULTS, SPECIMEN SENT FOR CULTURE (A) NEGATIVE Final    Comment:        The GeneXpert MRSA Assay (FDA approved for NASAL specimens only), is one component of a comprehensive MRSA colonization surveillance program. It is not intended to diagnose MRSA infection nor to guide or monitor treatment for MRSA infections. RESULT CALLED TO, READ BACK BY AND VERIFIED WITH: Archie Balboa RN 2008 11/26/16 A BROWNING   MRSA culture     Status: None   Collection Time: 11/26/16  5:11 PM  Result Value Ref Range Status   Specimen Description NASOPHARYNGEAL  Final   Special Requests NONE  Final   Culture NO MRSA DETECTED  Final   Report  Status 11/29/2016 FINAL  Final    Radiology Reports Dg Chest 2  View  Result Date: 11/29/2016 CLINICAL DATA:  Shortness of breath.  Cough. EXAM: CHEST  2 VIEW COMPARISON:  11/28/2016 . FINDINGS: Interim removal right IJ line. Mediastinum hilar structures normal. Stable cardiomegaly. Persistent but improved right upper lobe infiltrate. Improvement of bibasilar atelectasis. No prominent pleural effusion. No pneumothorax. IMPRESSION: 1. Interim removal right IJ line. 2. Persistent but improved right upper lobe infiltrate. Improved bibasilar atelectasis . Electronically Signed   By: Marcello Moores  Register   On: 11/29/2016 07:16   Dg Chest 2 View  Result Date: 11/26/2016 CLINICAL DATA:  Chest pain and shortness of breath EXAM: CHEST  2 VIEW COMPARISON:  03/11/2015 FINDINGS: Normal heart size. Small pleural effusions suspected. Airspace consolidation within the right upper lobe is identified. Left lung appears clear. IMPRESSION: 1. Right upper lobe airspace consolidation is identified. In the acute setting this is compatible with pneumonia. Followup PA and lateral chest X-ray is recommended in 3-4 weeks following trial of antibiotic therapy to ensure resolution and exclude underlying malignancy. 2. Small pleural effusions. Electronically Signed   By: Kerby Moors M.D.   On: 11/26/2016 10:44   Ct Angio Chest Pe W Or Wo Contrast  Result Date: 11/26/2016 CLINICAL DATA:  Shortness of breath and right-sided chest pain EXAM: CT ANGIOGRAPHY CHEST WITH CONTRAST TECHNIQUE: Multidetector CT imaging of the chest was performed using the standard protocol during bolus administration of intravenous contrast. Multiplanar CT image reconstructions and MIPs were obtained to evaluate the vascular anatomy. CONTRAST:  80 mL Isovue 370. COMPARISON:  None. FINDINGS: Cardiovascular: The thoracic aorta is well visualized without evidence of aneurysmal dilatation or dissection. The pulmonary artery shows a normal branching pattern. No  filling defects to suggest pulmonary emboli are identified. Mediastinum/Nodes: The thoracic inlet is within normal limits. Scattered small mediastinal lymph nodes are identified. No significant adenopathy is seen. Lungs/Pleura: Left lung is well aerated without focal infiltrate or sizable effusion. The right lung demonstrates considerable consolidation in the right upper lobe similar to that seen on recent plain film examination consistent with acute pneumonia. Additionally some patchy changes are noted in the right lower lobe. No significant effusion or pneumothorax is seen. Upper Abdomen: Fatty infiltration of the liver is noted. Musculoskeletal: Degenerative changes of the thoracic spine are seen. Review of the MIP images confirms the above findings. IMPRESSION: Right upper lobe pneumonia with significant consolidation. Some patchy infiltrate is also noted in the right lower lobe. No evidence of pulmonary emboli. Electronically Signed   By: Inez Catalina M.D.   On: 11/26/2016 13:22   Dg Chest Port 1 View  Result Date: 11/28/2016 CLINICAL DATA:  Followup community acquired pneumonia EXAM: PORTABLE CHEST 1 VIEW COMPARISON:  11/27/2016 FINDINGS: Right internal jugular central line tip is at the SVC RA junction. Consolidation of the right upper lobe persists, slightly improved since yesterday. Patchy infiltrate/volume loss persists in both lower lobes. No worsening or new finding. IMPRESSION: Very slight improvement with slight increased aeration in the right upper lobe. Patchy atelectasis/ infiltrate persists in both lower lobes. Electronically Signed   By: Nelson Chimes M.D.   On: 11/28/2016 07:14   Dg Chest Port 1 View  Result Date: 11/27/2016 CLINICAL DATA:  Shortness of breath this morning. EXAM: PORTABLE CHEST 1 VIEW COMPARISON:  Yesterday at 1956 hour FINDINGS: Right internal jugular central venous catheter tip at the atrial caval junction. Previous left central line is been removed. Low lung volumes  persist. Increasing right upper lobe opacity abutting the fissure. Increasing left lung base opacity. Unchanged  heart size and mediastinal contours. There is vascular congestion. IMPRESSION: Findings concerning for worsening multifocal pneumonia in the right upper and left lower lobes. Electronically Signed   By: Jeb Levering M.D.   On: 11/27/2016 06:07   Dg Chest Port 1 View  Result Date: 11/26/2016 CLINICAL DATA:  Central line placement. EXAM: PORTABLE CHEST 1 VIEW COMPARISON:  Chest radiograph Nov 26, 2016 at 1923 hours FINDINGS: New RIGHT internal jugular central venous catheter distal tip projects the cavoatrial junction. Unchanged catheter projecting LEFT chest. Cardiac silhouette is mildly enlarged. Patchy consolidation RIGHT upper lobe. No pleural effusion. No pneumothorax. Habitus limited examination. IMPRESSION: New RIGHT internal jugular central venous catheter distal tip projects the cavoatrial junction. No pneumothorax. Stable position of LEFT catheter. Mild cardiomegaly.  RIGHT upper lobe pneumonia. Electronically Signed   By: Elon Alas M.D.   On: 11/26/2016 20:19   Dg Chest Port 1 View  Result Date: 11/26/2016 CLINICAL DATA:  Central line placement EXAM: PORTABLE CHEST 1 VIEW COMPARISON:  Earlier same day FINDINGS: Left neck catheter takes an atypical course to the left of the spine. This could be within the superior intercostal vein, but aortic positioning is not excluded. Extensive pneumonia in the right lung is less densely consolidated than on the previous image. IMPRESSION: Central line atypically positioned. Whereas this could be an a venous structure such as the superior intercostal vein, arterial positioning is not excluded. These results will be called to the ordering clinician or representative by the Radiologist Assistant, and communication documented in the PACS or zVision Dashboard. Electronically Signed   By: Nelson Chimes M.D.   On: 11/26/2016 19:39   Dg Chest Port  1 View  Result Date: 11/26/2016 CLINICAL DATA:  Respiratory distress. Hx of breast cancer, GERD, hypertension, migraines, diabetes, OSA on CPAP, diaphragm surgery(1986)(trauma). Nonsmoker. EXAM: PORTABLE CHEST 1 VIEW COMPARISON:  11/26/2016 at 1552 hours FINDINGS: As noted on the earlier exam, there is right mid to lower lung zone consolidation, sparing the right apex. This is without significant change from the prior exam allowing for differences in patient positioning and technique. Left lung remains clear. Cardiac silhouette is normal in size. No definite pleural effusion.  No pneumothorax. IMPRESSION: 1. No significant change from the earlier exam. 2. Extensive right lung consolidation consistent with pneumonia. Electronically Signed   By: Lajean Manes M.D.   On: 11/26/2016 17:59   Dg Chest Port 1 View  Result Date: 11/26/2016 CLINICAL DATA:  56 year old female with history of shortness of breath and right-sided chest pain, worsening this morning with some associated shortness of breath. EXAM: PORTABLE CHEST 1 VIEW COMPARISON:  Chest x-ray 11/26/2016. FINDINGS: Significantly worsening airspace consolidation throughout the entire right lung, concerning for progressively worsening multilobar pneumonia. Probable small right pleural effusion. Left lung is clear. No evidence of pulmonary edema. Heart size is borderline enlarged. Upper mediastinal contours are within normal limits. IMPRESSION: 1. Worsening multilobar pneumonia in the right lung involving at least the right upper and right lower lobes. 2. Probable small right parapneumonic pleural effusion. Electronically Signed   By: Vinnie Langton M.D.   On: 11/26/2016 16:14    Time Spent in minutes  25   Louellen Molder M.D on 12/01/2016 at 11:31 AM  Between 7am to 7pm - Pager - [phone]  After 7pm go to [email or web address] - password [facility]  [facility] -  Office  [phone]

## 2016-12-01 NOTE — Progress Notes (Signed)
Paged Dr J.Kim. Regarding elevated bp. Waiting for orders. Pt asymptomatic.continue to monitor pt

## 2016-12-01 NOTE — Progress Notes (Signed)
Paged MD again for something different to help with pt's cough.  No call back yet.

## 2016-12-01 NOTE — Progress Notes (Signed)
Pt c/o cough, says the robitussin is not working.  MD paged.

## 2016-12-01 NOTE — Progress Notes (Signed)
   12/01/16 1958  Vitals  Temp 98.6 F (37 C)  Temp Source Oral  BP (!) 170/91  BP Location Right Arm  Pulse Rate (!) 106  Pulse Rate Source Dinamap  Resp 18  Oxygen Therapy  SpO2 98 %  O2 Device Room [employer]

## 2016-12-02 DIAGNOSIS — J189 Pneumonia, unspecified organism: Secondary | ICD-10-CM

## 2016-12-02 DIAGNOSIS — J181 Lobar pneumonia, unspecified organism: Secondary | ICD-10-CM

## 2016-12-02 DIAGNOSIS — A403 Sepsis due to Streptococcus pneumoniae: Secondary | ICD-10-CM

## 2016-12-02 LAB — CBC
HCT: 33 % — ABNORMAL LOW (ref 36.0–46.0)
Hemoglobin: 10.7 g/dL — ABNORMAL LOW (ref 12.0–15.0)
MCH: 22.9 pg — ABNORMAL LOW (ref 26.0–34.0)
MCHC: 32.4 g/dL (ref 30.0–36.0)
MCV: 70.7 fL — ABNORMAL LOW (ref 78.0–100.0)
Platelets: 293 10*3/uL (ref 150–400)
RBC: 4.67 MIL/uL (ref 3.87–5.11)
RDW: 17.1 % — ABNORMAL HIGH (ref 11.5–15.5)
WBC: 16.1 10*3/uL — ABNORMAL HIGH (ref 4.0–10.5)

## 2016-12-02 LAB — GLUCOSE, CAPILLARY
Glucose-Capillary: 211 mg/dL — ABNORMAL HIGH (ref 65–99)
Glucose-Capillary: 243 mg/dL — ABNORMAL HIGH (ref 65–99)

## 2016-12-02 MED ORDER — BENZONATATE 100 MG PO CAPS: 100.0000 mg | ORAL_CAPSULE | Freq: Three times a day (TID) | ORAL | 0 refills | Status: DC | PRN

## 2016-12-02 MED ORDER — GUAIFENESIN-DM 100-10 MG/5ML PO SYRP: 5.0000 mL | ORAL_SOLUTION | ORAL | 0 refills | Status: DC | PRN

## 2016-12-02 MED ORDER — DILTIAZEM HCL ER COATED BEADS 180 MG PO CP24: 300.0000 mg | ORAL_CAPSULE | Freq: Every day | ORAL | Status: DC

## 2016-12-02 NOTE — Discharge Instructions (Signed)
Community-Acquired Pneumonia, Adult °Pneumonia is an infection of the lungs. One type of pneumonia can happen while a person is in a hospital. A different type can happen when a person is not in a hospital (community-acquired pneumonia). It is easy for this kind to spread from person to person. It can spread to you if you breathe near an infected person who coughs or sneezes. Some symptoms include: °· A dry cough. °· A wet (productive) cough. °· Fever. °· Sweating. °· Chest pain. °Follow these instructions at home: °· Take over-the-counter and prescription medicines only as told by your doctor. °¨ Only take cough medicine if you are losing sleep. °¨ If you were prescribed an antibiotic medicine, take it as told by your doctor. Do not stop taking the antibiotic even if you start to feel better. °· Sleep with your head and neck raised (elevated). You can do this by putting a few pillows under your head, or you can sleep in a recliner. °· Do not use tobacco products. These include cigarettes, chewing tobacco, and e-cigarettes. If you need help quitting, ask your doctor. °· Drink enough water to keep your pee (urine) clear or pale yellow. °A shot (vaccine) can help prevent pneumonia. Shots are often suggested for: °· People older than 57 years of age. °· People older than 57 years of age: °¨ Who are having cancer treatment. °¨ Who have long-term (chronic) lung disease. °¨ Who have problems with their body's defense system (immune system). °You may also prevent pneumonia if you take these actions: °· Get the flu (influenza) shot every year. °· Go to the dentist as often as told. °· Wash your hands often. If soap and water are not available, use hand sanitizer. °Contact a doctor if: °· You have a fever. °· You lose sleep because your cough medicine does not help. °Get help right away if: °· You are short of breath and it gets worse. °· You have more chest pain. °· Your sickness gets worse. This is very serious if: °¨ You  are an older adult. °¨ Your body's defense system is weak. °· You cough up blood. °This information is not intended to replace advice given to you by your health care provider. Make sure you discuss any questions you have with your health care provider. °Document Released: 12/19/2007 Document Revised: 12/08/2015 Document Reviewed: 10/27/2014 °Elsevier Interactive Patient Education © 2017 [employer] ° °

## 2016-12-02 NOTE — Discharge Summary (Addendum)
Physician Discharge Summary  Brittany Rubio JME:[id number] DOB: [birth year] DOA: 11/26/2016  PCP: Glendale Chard, MD  Admit date: 11/26/2016 Discharge date: 12/02/2016  Admit date: 11/26/2016 Discharge date: 12/02/2016  Admitted From: Home Disposition:  Home  Recommendations for Outpatient Follow-up:  1. Follow up with PCP in 1 week. Please check WBC during outpatient follow-up. Patient completes seven-day course of antibiotic prior to discharge. 2. Please obtain follow-up chest x-ray in 3-4 weeks to ensure resolution of pneumonia.  Home Health: None Equipment/Devices: None  Discharge Condition: Fair CODE STATUS: Full code Diet recommendation: Low sodium/diabetic    Discharge Diagnoses:  Principal Problem:   Septic shock ([place])   Active Problems:   Acute respiratory failure with hypoxia (HCC)   Hypertension   AKI (acute kidney injury) ([place])   Hyperlipidemia   GERD (gastroesophageal reflux disease)   Lobar pneumonia (HCC)   Diabetes mellitus with complication (HCC)  Brief narrative/history of present illness Please refer to admission H&P for details, brief,56 year old obese female with hypertension, breast malignancy, type 2 diabetes mellitus, GERD, thyroid carcinoma, hyperlipidemia admitted with sepsis secondary to multilobar pneumonia. Patient initially managing stepdown but went to respiratory failure requiring BiPAP, became hypotensive not responding to fluid challenge. PC CM consulted and she was transferred to ICU for septic shock. Patient was started on levophed and continue empiric antibiotics. Symptoms improved and transferred to hospitalist service on 5/17.  Hospital course  Principal Problem: Septic shock ([place]) Secondary to multilobar pneumonia. Urine strep antigen positive. Antibiotics narrowed to Levaquin, completes seven-day course prior to discharge. Blood cultures negative -Sepsis resolved. Remains afebrile last 24 hours and leukocytosis improving. Repeat  chest x-ray from 5/17 shows improving right upper lobe infiltrate. -Supportive care with Tylenol and antitussives.   Patient stable to be discharged home. Needs follow-up chest x-ray is 3-4 weeks to ensure resolution.  Active Problems:  Acute respiratory failure with hypoxia (HCC) Secondary to sepsis with pneumonia and mild pulmonary edema on admission. Now stable on room air.  2-D echo with EF of 60-65%, no wall motion abnormality.  Diabetes mellitus type 2 ([place]) Resume Janumet.  Hypothyroidism Continue Synthroid  Essential hypertension Resumed home medications. Mildly elevated blood pressure. Monitor during outpatient follow-up.  GERD Continue PPI   Migraine headaches Stadol when necessary    Family Communication  : Discussed with husband on the phone  Disposition Plan  : Home    Barriers For Discharge : Active symptoms  Consults  :  PC CM  Procedures  :  2-D echo  Discharge Instructions   Allergies as of 12/02/2016      Reactions   Iodine    Other reaction(s): Hives/Skin Rash   Shellfish Allergy Swelling      Medication List    TAKE these medications   amphetamine-dextroamphetamine 20 MG tablet Commonly known as:  ADDERALL Take 20 mg by mouth daily.   benzonatate 100 MG capsule Commonly known as:  TESSALON Take 1 capsule (100 mg total) by mouth 3 (three) times daily as needed for cough.   butorphanol 10 MG/ML nasal spray Commonly known as:  STADOL Place 1 spray into the nose every 4 (four) hours as needed. For migraines   cholecalciferol 1000 units tablet Commonly known as:  VITAMIN D Take 1,000 Units by mouth daily.   cyanocobalamin 500 MCG tablet Take 500 mcg by mouth daily.   diazepam 5 MG tablet Commonly known as:  VALIUM Take 2.5-5 mg by mouth daily as needed for anxiety.   EPIPEN 2-PAK 0.3 mg/0.3  mL Soaj injection Generic drug:  EPINEPHrine 0.3 mLs by Subdermal route once as needed. Allergic reaction    esomeprazole 20 MG capsule Commonly known as:  NEXIUM Take 20 mg by mouth daily at 12 noon.   FARXIGA 10 MG Tabs tablet Generic drug:  dapagliflozin propanediol Take 10 mg by mouth daily.   ferrous sulfate 325 (65 FE) MG tablet Take 325 mg by mouth daily as needed. Low iron   guaiFENesin-dextromethorphan 100-10 MG/5ML syrup Commonly known as:  ROBITUSSIN DM Take 5 mLs by mouth every 4 (four) hours as needed for cough.   ibuprofen 800 MG tablet Commonly known as:  ADVIL,MOTRIN Take 1 tablet (800 mg total) by mouth 3 (three) times daily. What changed:  when to take this  reasons to take this   JANUMET 50-1000 MG tablet Generic drug:  sitaGLIPtin-metformin Take 1 tablet by mouth 2 (two) times daily with a meal.   levothyroxine 112 MCG tablet Commonly known as:  SYNTHROID, LEVOTHROID Take 224 mcg by mouth every morning.   lisinopril-hydrochlorothiazide 20-25 MG tablet Commonly known as:  PRINZIDE,ZESTORETIC Take 1 tablet by mouth daily.   LIVALO 4 MG Tabs Generic drug:  Pitavastatin Calcium Take 4 mg by mouth daily.   MATZIM LA 240 MG 24 hr tablet Generic drug:  diltiazem Take 240 mg by mouth at bedtime.   traZODone 50 MG tablet Commonly known as:  DESYREL Take 100 mg by mouth at bedtime.   VIIBRYD 40 MG Tabs Generic drug:  Vilazodone HCl Take 40 mg by mouth at bedtime.      Follow-up Information    Glendale Chard, MD. Schedule an appointment as soon as possible for a visit in 1 week(s).   Specialty:  Internal Medicine Contact information: [address] [place] [address] [phone]          Allergies  Allergen Reactions  . Iodine     Other reaction(s): Hives/Skin Rash  . Shellfish Allergy Swelling     Procedures/Studies: Dg Chest 2 View  Result Date: 11/29/2016 CLINICAL DATA:  Shortness of breath.  Cough. EXAM: CHEST  2 VIEW COMPARISON:  11/28/2016 . FINDINGS: Interim removal right IJ line. Mediastinum hilar structures  normal. Stable cardiomegaly. Persistent but improved right upper lobe infiltrate. Improvement of bibasilar atelectasis. No prominent pleural effusion. No pneumothorax. IMPRESSION: 1. Interim removal right IJ line. 2. Persistent but improved right upper lobe infiltrate. Improved bibasilar atelectasis . Electronically Signed   By: Marcello Moores  Register   On: 11/29/2016 07:16   Dg Chest 2 View  Result Date: 11/26/2016 CLINICAL DATA:  Chest pain and shortness of breath EXAM: CHEST  2 VIEW COMPARISON:  03/11/2015 FINDINGS: Normal heart size. Small pleural effusions suspected. Airspace consolidation within the right upper lobe is identified. Left lung appears clear. IMPRESSION: 1. Right upper lobe airspace consolidation is identified. In the acute setting this is compatible with pneumonia. Followup PA and lateral chest X-ray is recommended in 3-4 weeks following trial of antibiotic therapy to ensure resolution and exclude underlying malignancy. 2. Small pleural effusions. Electronically Signed   By: Kerby Moors M.D.   On: 11/26/2016 10:44   Ct Angio Chest Pe W Or Wo Contrast  Result Date: 11/26/2016 CLINICAL DATA:  Shortness of breath and right-sided chest pain EXAM: CT ANGIOGRAPHY CHEST WITH CONTRAST TECHNIQUE: Multidetector CT imaging of the chest was performed using the standard protocol during bolus administration of intravenous contrast. Multiplanar CT image reconstructions and MIPs were obtained to evaluate the vascular anatomy. CONTRAST:  80 mL Isovue 370. COMPARISON:  None. FINDINGS: Cardiovascular: The thoracic aorta is well visualized without evidence of aneurysmal dilatation or dissection. The pulmonary artery shows a normal branching pattern. No filling defects to suggest pulmonary emboli are identified. Mediastinum/Nodes: The thoracic inlet is within normal limits. Scattered small mediastinal lymph nodes are identified. No significant adenopathy is seen. Lungs/Pleura: Left lung is well aerated without  focal infiltrate or sizable effusion. The right lung demonstrates considerable consolidation in the right upper lobe similar to that seen on recent plain film examination consistent with acute pneumonia. Additionally some patchy changes are noted in the right lower lobe. No significant effusion or pneumothorax is seen. Upper Abdomen: Fatty infiltration of the liver is noted. Musculoskeletal: Degenerative changes of the thoracic spine are seen. Review of the MIP images confirms the above findings. IMPRESSION: Right upper lobe pneumonia with significant consolidation. Some patchy infiltrate is also noted in the right lower lobe. No evidence of pulmonary emboli. Electronically Signed   By: Inez Catalina M.D.   On: 11/26/2016 13:22   Dg Chest Port 1 View  Result Date: 11/28/2016 CLINICAL DATA:  Followup community acquired pneumonia EXAM: PORTABLE CHEST 1 VIEW COMPARISON:  11/27/2016 FINDINGS: Right internal jugular central line tip is at the SVC RA junction. Consolidation of the right upper lobe persists, slightly improved since yesterday. Patchy infiltrate/volume loss persists in both lower lobes. No worsening or new finding. IMPRESSION: Very slight improvement with slight increased aeration in the right upper lobe. Patchy atelectasis/ infiltrate persists in both lower lobes. Electronically Signed   By: Nelson Chimes M.D.   On: 11/28/2016 07:14   Dg Chest Port 1 View  Result Date: 11/27/2016 CLINICAL DATA:  Shortness of breath this morning. EXAM: PORTABLE CHEST 1 VIEW COMPARISON:  Yesterday at 1956 hour FINDINGS: Right internal jugular central venous catheter tip at the atrial caval junction. Previous left central line is been removed. Low lung volumes persist. Increasing right upper lobe opacity abutting the fissure. Increasing left lung base opacity. Unchanged heart size and mediastinal contours. There is vascular congestion. IMPRESSION: Findings concerning for worsening multifocal pneumonia in the right upper  and left lower lobes. Electronically Signed   By: Jeb Levering M.D.   On: 11/27/2016 06:07   Dg Chest Port 1 View  Result Date: 11/26/2016 CLINICAL DATA:  Central line placement. EXAM: PORTABLE CHEST 1 VIEW COMPARISON:  Chest radiograph Nov 26, 2016 at 1923 hours FINDINGS: New RIGHT internal jugular central venous catheter distal tip projects the cavoatrial junction. Unchanged catheter projecting LEFT chest. Cardiac silhouette is mildly enlarged. Patchy consolidation RIGHT upper lobe. No pleural effusion. No pneumothorax. Habitus limited examination. IMPRESSION: New RIGHT internal jugular central venous catheter distal tip projects the cavoatrial junction. No pneumothorax. Stable position of LEFT catheter. Mild cardiomegaly.  RIGHT upper lobe pneumonia. Electronically Signed   By: Elon Alas M.D.   On: 11/26/2016 20:19   Dg Chest Port 1 View  Result Date: 11/26/2016 CLINICAL DATA:  Central line placement EXAM: PORTABLE CHEST 1 VIEW COMPARISON:  Earlier same day FINDINGS: Left neck catheter takes an atypical course to the left of the spine. This could be within the superior intercostal vein, but aortic positioning is not excluded. Extensive pneumonia in the right lung is less densely consolidated than on the previous image. IMPRESSION: Central line atypically positioned. Whereas this could be an a venous structure such as the superior intercostal vein, arterial positioning is not excluded. These results will be called to the ordering clinician or representative  by the Radiologist Assistant, and communication documented in the PACS or zVision Dashboard. Electronically Signed   By: Nelson Chimes M.D.   On: 11/26/2016 19:39   Dg Chest Port 1 View  Result Date: 11/26/2016 CLINICAL DATA:  Respiratory distress. Hx of breast cancer, GERD, hypertension, migraines, diabetes, OSA on CPAP, diaphragm surgery(1986)(trauma). Nonsmoker. EXAM: PORTABLE CHEST 1 VIEW COMPARISON:  11/26/2016 at 1552 hours  FINDINGS: As noted on the earlier exam, there is right mid to lower lung zone consolidation, sparing the right apex. This is without significant change from the prior exam allowing for differences in patient positioning and technique. Left lung remains clear. Cardiac silhouette is normal in size. No definite pleural effusion.  No pneumothorax. IMPRESSION: 1. No significant change from the earlier exam. 2. Extensive right lung consolidation consistent with pneumonia. Electronically Signed   By: Lajean Manes M.D.   On: 11/26/2016 17:59   Dg Chest Port 1 View  Result Date: 11/26/2016 CLINICAL DATA:  56 year old female with history of shortness of breath and right-sided chest pain, worsening this morning with some associated shortness of breath. EXAM: PORTABLE CHEST 1 VIEW COMPARISON:  Chest x-ray 11/26/2016. FINDINGS: Significantly worsening airspace consolidation throughout the entire right lung, concerning for progressively worsening multilobar pneumonia. Probable small right pleural effusion. Left lung is clear. No evidence of pulmonary edema. Heart size is borderline enlarged. Upper mediastinal contours are within normal limits. IMPRESSION: 1. Worsening multilobar pneumonia in the right lung involving at least the right upper and right lower lobes. 2. Probable small right parapneumonic pleural effusion. Electronically Signed   By: Vinnie Langton M.D.   On: 11/26/2016 16:14    2-D echo Study Conclusions  - Left ventricle: The cavity size was normal. Wall thickness was   normal. Systolic function was normal. The estimated ejection   fraction was in the range of 60% to 65%. Wall motion was normal;   there were no regional wall motion abnormalities. The study is   not technically sufficient to allow evaluation of LV diastolic   function. - Mitral valve: Mildly thickened leaflets . There was trivial   regurgitation. - Left atrium: The atrium was mildly dilated. - Right atrium: Moderately  dilated. - Inferior vena cava: The vessel was normal in size. The   respirophasic diameter changes were in the normal range (>= 50%),   consistent with normal central venous pressure.  Impressions:  - LVEF 60-65%, normal wall thickness, normal wall motion, mild LAE,   moderate RAE, normal IVC.   Subjective: Cough and breathing much better. Stable on room air.  Discharge Exam: Vitals:   12/01/16 1958 12/02/16 0605  BP: (!) 170/91 (!) 144/62  Pulse: (!) 106 92  Resp: 18 18  Temp: 98.6 F (37 C) 98.8 F (37.1 C)   Vitals:   12/01/16 1100 12/01/16 1958 12/02/16 0605 12/02/16 0918  BP: (!) 156/92 (!) 170/91 (!) 144/62   Pulse: 97 (!) 106 92   Resp: 20 18 18    Temp: 98.7 F (37.1 C) 98.6 F (37 C) 98.8 F (37.1 C)   TempSrc: Oral Oral Oral   SpO2: 99% 98% 100% 100%  Weight:   106.2 kg (234 lb 3.2 oz)   Height:        Gen.: Middle-aged female not in distress HEENT: Moist mucosa Chest: Clear to auscultation bilaterally CVS: Normal S1 and S2, no murmurs GI: Soft, nondistended, nontender Musculoskeletal: Warm, no edema   The results of significant diagnostics from this hospitalization (including imaging, microbiology,  ancillary and laboratory) are listed below for reference.     Microbiology: Recent Results (from the past 240 hour(s))  Blood Culture (routine x 2)     Status: None   Collection Time: 11/26/16 12:10 PM  Result Value Ref Range Status   Specimen Description BLOOD LEFT ANTECUBITAL  Final   Special Requests   Final    BOTTLES DRAWN AEROBIC AND ANAEROBIC Blood Culture adequate volume   Culture NO GROWTH 5 DAYS  Final   Report Status 12/01/2016 FINAL  Final  Blood Culture (routine x 2)     Status: None   Collection Time: 11/26/16 12:19 PM  Result Value Ref Range Status   Specimen Description BLOOD LEFT HAND  Final   Special Requests   Final    BOTTLES DRAWN AEROBIC ONLY Blood Culture adequate volume   Culture NO GROWTH 5 DAYS  Final   Report Status  12/01/2016 FINAL  Final  MRSA PCR Screening     Status: Abnormal   Collection Time: 11/26/16  5:11 PM  Result Value Ref Range Status   MRSA by PCR INVALID RESULTS, SPECIMEN SENT FOR CULTURE (A) NEGATIVE Final    Comment:        The GeneXpert MRSA Assay (FDA approved for NASAL specimens only), is one component of a comprehensive MRSA colonization surveillance program. It is not intended to diagnose MRSA infection nor to guide or monitor treatment for MRSA infections. RESULT CALLED TO, READ BACK BY AND VERIFIED WITH: J CLIFTON RN 2008 11/26/16 A BROWNING   MRSA culture     Status: None   Collection Time: 11/26/16  5:11 PM  Result Value Ref Range Status   Specimen Description NASOPHARYNGEAL  Final   Special Requests NONE  Final   Culture NO MRSA DETECTED  Final   Report Status 11/29/2016 FINAL  Final     Labs: BNP (last 3 results)  Recent Labs  11/26/16 1836  BNP 166.0*   Basic Metabolic Panel:  Recent Labs Lab 11/26/16 1008 11/26/16 1836 11/27/16 0106 11/28/16 0230 11/30/16 0514  NA 134* 134* 132* 133* 136  K 3.7 3.4* 5.0 3.5 3.5  CL 95* 99* 100* 102 95*  CO2 24 20* 19* 23 30  GLUCOSE 291* 213* 250* 97 168*  BUN 17 22* 34* 36* 10  CREATININE 1.35* 1.48* 1.78* 1.18* 0.75  CALCIUM 8.9 7.6* 7.4* 7.7* 9.0  MG  --  1.2* 1.4*  --   --   PHOS  --  3.4 5.4*  --   --    Liver Function Tests: No results for input(s): AST, ALT, ALKPHOS, BILITOT, PROT, ALBUMIN in the last 168 hours. No results for input(s): LIPASE, AMYLASE in the last 168 hours. No results for input(s): AMMONIA in the last 168 hours. CBC:  Recent Labs Lab 11/26/16 1008 11/26/16 1836  11/28/16 0230 11/29/16 0330 11/30/16 0514 12/01/16 0352 12/02/16 0742  WBC 19.9* 15.7*  < > 23.5* 22.9* 17.1* 17.8* 16.1*  NEUTROABS 18.5* 14.2*  --   --   --   --   --   --   HGB 12.5 11.1*  < > 9.7* 11.1* 11.2* 10.6* 10.7*  HCT 39.0 35.3*  < > 31.1* 35.4* 34.6* 33.5* 33.0*  MCV 72.4* 73.4*  < > 72.0* 72.7*  71.5* 71.0* 70.7*  PLT 250 204  < > 235 266 258 266 293  < > = values in this interval not displayed. Cardiac Enzymes:  Recent Labs Lab 11/26/16 2030 11/27/16 0106 11/27/16  0706  TROPONINI 0.03* 0.07* 0.25*   BNP: Invalid input(s): POCBNP CBG:  Recent Labs Lab 11/30/16 2151 12/01/16 0730 12/01/16 1211 12/01/16 2140 12/02/16 0724  GLUCAP 136* 151* 203* 259* 211*   D-Dimer No results for input(s): DDIMER in the last 72 hours. Hgb A1c No results for input(s): HGBA1C in the last 72 hours. Lipid Profile No results for input(s): CHOL, HDL, LDLCALC, TRIG, CHOLHDL, LDLDIRECT in the last 72 hours. Thyroid function studies No results for input(s): TSH, T4TOTAL, T3FREE, THYROIDAB in the last 72 hours.  Invalid input(s): FREET3 Anemia work up No results for input(s): VITAMINB12, FOLATE, FERRITIN, TIBC, IRON, RETICCTPCT in the last 72 hours. Urinalysis No results found for: COLORURINE, APPEARANCEUR, [place], [place], [place], [place], [place], [place], PROTEINUR, UROBILINOGEN, NITRITE, LEUKOCYTESUR Sepsis Labs Invalid input(s): PROCALCITONIN,  WBC,  LACTICIDVEN Microbiology Recent Results (from the past 240 hour(s))  Blood Culture (routine x 2)     Status: None   Collection Time: 11/26/16 12:10 PM  Result Value Ref Range Status   Specimen Description BLOOD LEFT ANTECUBITAL  Final   Special Requests   Final    BOTTLES DRAWN AEROBIC AND ANAEROBIC Blood Culture adequate volume   Culture NO GROWTH 5 DAYS  Final   Report Status 12/01/2016 FINAL  Final  Blood Culture (routine x 2)     Status: None   Collection Time: 11/26/16 12:19 PM  Result Value Ref Range Status   Specimen Description BLOOD LEFT HAND  Final   Special Requests   Final    BOTTLES DRAWN AEROBIC ONLY Blood Culture adequate volume   Culture NO GROWTH 5 DAYS  Final   Report Status 12/01/2016 FINAL  Final  MRSA PCR Screening     Status: Abnormal   Collection Time: 11/26/16  5:11 PM  Result Value Ref Range  Status   MRSA by PCR INVALID RESULTS, SPECIMEN SENT FOR CULTURE (A) NEGATIVE Final    Comment:        The GeneXpert MRSA Assay (FDA approved for NASAL specimens only), is one component of a comprehensive MRSA colonization surveillance program. It is not intended to diagnose MRSA infection nor to guide or monitor treatment for MRSA infections. RESULT CALLED TO, READ BACK BY AND VERIFIED WITH: Archie Balboa RN 2008 11/26/16 A BROWNING   MRSA culture     Status: None   Collection Time: 11/26/16  5:11 PM  Result Value Ref Range Status   Specimen Description NASOPHARYNGEAL  Final   Special Requests NONE  Final   Culture NO MRSA DETECTED  Final   Report Status 11/29/2016 FINAL  Final     Time coordinating discharge: Over 30 minutes  SIGNED:   Louellen Molder, MD  [facility] 12/02/2016, 11:10 AM Pager   If 7PM-7AM, please contact night-coverage [email or web address] Password [email or web address]

## 2016-12-02 NOTE — Progress Notes (Signed)
Late entry: Pt refused PNA vaccine.

## 2016-12-02 NOTE — Progress Notes (Signed)
Orders received for pt discharge.  Discharge summary printed and reviewed with pt.  Explained medication regimen, and pt had no further questions at this time.  IV removed and site remains clean, dry, intact.  Telemetry removed.   Pt adamant about taking home Stadol nasal spray (for headaches) prior to leaving.  This is an expensive drug, and although pt takes at home PTA, she stated that she no longer had any at home. Tasha (CN) and I had difficulty locating this medication.  After speaking with Olivia Mackie (controlled substance pharmacy technician). Tasha and I were able to finally locate this narcotic (in pyxis - override, then home medications).  This medication was NOT in pt bin, as earlier suspected.  Olivia Mackie stated that it was ok to give stadol nasal spray bottle to pt at discharge. Pt in stable condition and awaiting transport.

## 2016-12-03 LAB — GLUCOSE, CAPILLARY: Glucose-Capillary: 196 mg/dL — ABNORMAL HIGH (ref 65–99)

## 2016-12-13 ENCOUNTER — Ambulatory Visit (INDEPENDENT_AMBULATORY_CARE_PROVIDER_SITE_OTHER): Payer: BC Managed Care – PPO | Admitting: Neurology

## 2016-12-13 ENCOUNTER — Encounter: Payer: Self-pay | Admitting: Neurology

## 2016-12-13 VITALS — BP 130/85 | HR 79 | Resp 20 | Ht 64.5 in | Wt 234.0 lb

## 2016-12-13 DIAGNOSIS — E669 Obesity, unspecified: Secondary | ICD-10-CM

## 2016-12-13 DIAGNOSIS — N182 Chronic kidney disease, stage 2 (mild): Secondary | ICD-10-CM

## 2016-12-13 DIAGNOSIS — C73 Malignant neoplasm of thyroid gland: Secondary | ICD-10-CM

## 2016-12-13 DIAGNOSIS — G4733 Obstructive sleep apnea (adult) (pediatric): Secondary | ICD-10-CM

## 2016-12-13 DIAGNOSIS — I10 Essential (primary) hypertension: Secondary | ICD-10-CM | POA: Diagnosis not present

## 2016-12-13 DIAGNOSIS — Z79899 Other long term (current) drug therapy: Secondary | ICD-10-CM

## 2016-12-13 DIAGNOSIS — J1100 Influenza due to unidentified influenza virus with unspecified type of pneumonia: Secondary | ICD-10-CM

## 2016-12-13 DIAGNOSIS — D508 Other iron deficiency anemias: Secondary | ICD-10-CM

## 2016-12-13 NOTE — Progress Notes (Signed)
SLEEP MEDICINE CLINIC   Provider:  Larey Seat, M D  Primary Care Physician:  Glendale Chard, MD   Referring Provider: Glendale Chard, MD    Chief Complaint  Patient presents with  . New Patient (Initial Visit)    snores, had a sleep study a few years ago    HPI:  Brittany Rubio is an afro-american  56 y.o. female , seen here as in a referral from Dr. Baird Cancer for a sleep evaluation.    [name] is a patient of Dr. Tye Savoy that underwent about 8 years ago a sleep study. The place where her sleep testing took place is no longer existing, reportedly it was close to [address], possible Dr. Konrad Felix laboratory on [address]. She felt that the CPAP was cumbersome but helpful. She felt less sleepy and her sleep may have been deeper but the machine was not easy to use or set up. She is here today partially to see if she still would need CPAP, is unclear what stage of apnea she may be in, and also to see if the different treatment options today that were not available in 2010.  Past medical history is positive for diabetic nephropathy, chronic kidney disease stage II, type 2 diabetes attention deficit disorder since childhood, hypertensive renal disease, allergic rhinitis, joint arthritis, superobesity and obstructive sleep apnea I also reviewed briefly her list of medications. [name] is postmenopausal, she does still have insomnia and her obstructive sleep apnea has been untreated for years. The patient had been diagnosed with sleep apnea and was given a CPAP machine, but she was unable to obtain supplies and has not used the machine in 4 or 5 years. She has been more daytime sleepy but usually can control her sleepiness either by starting to walk around or looking for other stimulation.   Sleep habits are as follows: She usually watches TV for the last hour before retreating to the bedroom, between 11 and midnight. She does watches TV in the bedroom and uses her smart  phone. She struggles to fall asleep, but once asleep only sleeps for about 2 or 3 hour intervals fragmented by bathroom breaks. On average 2 bathroom breaks at night. She does not have preferred sleep position, sleeps prone or supine or on the side. She sleeps with 2-3 pillows for support. She sometimes experiences vivid dreams, and she has woken up out of dreams but rarely with palpitations or diaphoresis. She sets her alarm between 5:30 and 6 AM, she uses the snooze function several times before finally rising. She always feels that she needs more sleep and isn't restored and refreshed yet. Her nocturnal sleep time averages about 5 hours. She may take a Sunday afternoon nap but does not nap during week days. Her naps will be power naps of 30 minutes duration.  Sleep medical history and family sleep history:  Mother snored, she's not aware of anybody being diagnosed with sleep apnea, her maternal grandmother suffered from heart disease in the paternal grandmother for breast cancer her mother died of pancreatic cancer she was in her early 58s. Her father died of meningitis - very young in 1979, smoked and drank .  Social history: [name] is married, has one adult adopted son, she is a nonsmoker never used tobacco products she very seldomly drinks alcohol, she does use coffee in the mornings about 2 cups and sometimes tea or soda in the afternoons but not daily. She has no shift work history. She works  at [employer] in [employer], she is an Environmental consultant principal.   Review of Systems: Out of a complete 14 system review, the patient complains of only the following symptoms, and all other reviewed systems are negative. Depression, insomnia, snoring, nocturia and weight gain.   Epworth score 14 , Fatigue severity score 10  , depression score 4/15    Social History   Social History  . Marital status: Married    Spouse name: N/A  . Number of children: N/A  . Years of education:  N/A   Occupational History  . Not on file.   Social History Main Topics  . Smoking status: Never Smoker  . Smokeless tobacco: Never Used  . Alcohol use Yes     Comment: 07/07/2014 "a few times/year; weddings, anniversary, etc"  . Drug use: No  . Sexual activity: Yes   Other Topics Concern  . Not on file   Social History Narrative  . No narrative on file    Family History  Problem Relation Age of Onset  . Cancer Mother        Pancreatic    Past Medical History:  Diagnosis Date  . Arthritis    "left shoulder" (07/07/2014)  . Breast cancer ([place])    "left"  . GERD (gastroesophageal reflux disease)    "takes over the counters as needed"  . High cholesterol   . Hypertension 11/26/2011   sees Dr. Bryon Lions  . Migraines    "maybe once q 3 months" (07/07/2014)  . Multinodular thyroid 2015  . OSA on CPAP   . Personal history of radiation therapy   . Type II diabetes mellitus ([place])     Past Surgical History:  Procedure Laterality Date  . ABDOMINAL HYSTERECTOMY  ? 1997  . BREAST EXCISIONAL BIOPSY Right 2014  . BREAST LUMPECTOMY Left 2009  . BREAST LUMPECTOMY Right 07/2012   "not a mastectomy"  . [place]   "trauma"  . PARTIAL MASTECTOMY WITH NEEDLE LOCALIZATION  08/12/2012   Procedure: PARTIAL MASTECTOMY WITH NEEDLE LOCALIZATION;  Surgeon: Marcello Moores A. Cornett, MD;  Location: [place];  Service: General;  Laterality: Right;  . REDUCTION MAMMAPLASTY Bilateral 1995  . THYROIDECTOMY Left 07/07/2014   Procedure: LEFT HEMI-THYROIDECTOMY;  Surgeon: Ascencion Dike, MD;  Location: [place];  Service: ENT;  Laterality: Left;  . THYROIDECTOMY Right 07/08/2014   Procedure: Completion THYROIDECTOMY;  Surgeon: Ascencion Dike, MD;  Location: [place];  Service: ENT;  Laterality: Right;  . THYROIDECTOMY, PARTIAL Left 07/07/2014   hemi    Current Outpatient Prescriptions  Medication Sig Dispense Refill  . amphetamine-dextroamphetamine (ADDERALL) 20 MG tablet Take 20 mg by mouth  daily.     . benzonatate (TESSALON) 100 MG capsule Take 1 capsule (100 mg total) by mouth 3 (three) times daily as needed for cough. 20 capsule 0  . butorphanol (STADOL) 10 MG/ML nasal spray Place 1 spray into the nose every 4 (four) hours as needed. For migraines    . cholecalciferol (VITAMIN D) 1000 units tablet Take 1,000 Units by mouth daily.    . cyanocobalamin 500 MCG tablet Take 500 mcg by mouth daily.    . Dapagliflozin Propanediol (FARXIGA) 10 MG TABS Take 10 mg by mouth daily.    . diazepam (VALIUM) 5 MG tablet Take 2.5-5 mg by mouth daily as needed for anxiety.  1  . diltiazem (MATZIM LA) 240 MG 24 hr tablet Take 240 mg by mouth at bedtime.     Marland Kitchen  EPINEPHrine (EPIPEN 2-PAK) 0.3 mg/0.3 mL IJ SOAJ injection 0.3 mLs by Subdermal route once as needed. Allergic reaction    . esomeprazole (NEXIUM) 20 MG capsule Take 20 mg by mouth daily at 12 noon.    . ferrous sulfate 325 (65 FE) MG tablet Take 325 mg by mouth daily as needed. Low iron    . guaiFENesin-dextromethorphan (ROBITUSSIN DM) 100-10 MG/5ML syrup Take 5 mLs by mouth every 4 (four) hours as needed for cough. 118 mL 0  . ibuprofen (ADVIL,MOTRIN) 800 MG tablet Take 1 tablet (800 mg total) by mouth 3 (three) times daily. (Patient taking differently: Take 800 mg by mouth 2 (two) times daily as needed for mild pain. ) 21 tablet 0  . JANUMET 50-1000 MG tablet Take 1 tablet by mouth 2 (two) times daily with a meal.  1  . levothyroxine (SYNTHROID, LEVOTHROID) 112 MCG tablet Take 224 mcg by mouth every morning.     Marland Kitchen lisinopril-hydrochlorothiazide (PRINZIDE,ZESTORETIC) 20-25 MG tablet Take 1 tablet by mouth daily.     Marland Kitchen LIVALO 4 MG TABS Take 4 mg by mouth daily.   5  . traZODone (DESYREL) 50 MG tablet Take 100 mg by mouth at bedtime.   1  . VIIBRYD 40 MG TABS Take 40 mg by mouth at bedtime.  3   No current facility-administered medications for this visit.     Allergies as of 12/13/2016 - Review Complete 12/13/2016  Allergen Reaction Noted    . Iodine  07/16/1898  . Shellfish allergy Swelling 08/05/2012    Vitals: BP 130/85   Pulse 79   Resp 20   Ht 5' 4.5" (1.638 m)   Wt 234 lb (106.1 kg)   BMI 39.55 kg/m  Last Weight:  Wt Readings from Last 1 Encounters:  12/13/16 234 lb (106.1 kg)   YNW:GNFA mass index is 39.55 kg/m.     Last Height:   Ht Readings from Last 1 Encounters:  12/13/16 5' 4.5" (1.638 m)    Physical exam:  General: The patient is awake, alert and appears not in acute distress. The patient is well groomed. Head: Normocephalic, atraumatic. Neck is supple. Mallampati 4 neck circumference:17. Nasal airflow patent . Retrognathia is seen.  Cardiovascular:  Regular rate and rhythm- without  murmurs or carotid bruit, and without distended neck veins. Respiratory: Lungs are clear to auscultation. Skin:  Without evidence of edema, or rash Trunk: BMI is 40. The patient's posture is erect   Neurologic exam : The patient is awake and alert, oriented to place and time.   Attention span & concentration ability appears normal. She has problems multitasking  Speech is fluent,  without dysarthria, dysphonia or aphasia.  Mood and affect are appropriate.  Cranial nerves: Pupils are equal and briskly reactive to light. Extraocular movements  in vertical and horizontal planes intact and without nystagmus. Visual fields by finger perimetry are intact. Hearing to finger rub intact.   Facial sensation intact to fine touch.  Facial motor strength is symmetric and tongue and uvula move midline. Shoulder shrug was symmetrical.   Motor exam:   Normal tone, muscle bulk and symmetric strength in all extremities. Sensory:  Fine touch, pinprick and vibration were normal. Coordination: Rapid alternating movements in the fingers/hands was normal. Finger-to-nose maneuver  normal without evidence of ataxia, dysmetria or tremor. Gait and station: Patient walks without assistive device and is able unassisted to climb up to the exam  table. Strength within normal limits.  Stance is stable and normal.  Turns with  3-4  Steps. Romberg testing is negative.  Deep tendon reflexes: in the  upper and lower extremities are symmetric and intact. Babinski maneuver response is  Downgoing.   Mrs. Barner had just been admitted to hospital with pneumonia and septic shock at [facility]. I reviewed the notes from [place] , [place].  Mrs. Angello was hospitalized on 11-26-2016.  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 Diagnosed by Dr. Benjamine Mola.    Assessment:  After physical and neurologic examination, review of laboratory studies,  Personal review of imaging studies, reports of other /same  Imaging studies, results of polysomnography and / or neurophysiology testing and pre-existing records as far as provided in visit., my assessment is   1)  Mrs. Aaron Edelman does not endorse fatigue as much as daytime sleepiness, problems with attention especially when multitasking, and she has been witnessed to have continued displayed apnea and snoring. Snoring may have become worse after thyroid tumor therapy.   Her husband works as a PA stated that he has witnessed more breathing disordered sleep when she is in supine position. I strongly suspect that her obstructive sleep apnea has not resolved it may have progressed over the last years. She has experience using CPAP for about 2-3 years.   2) sleep hygiene. Mrs. Aaron Edelman at this time is considered to be in correlative sleep deprivation as she usually gets 5-5-1/2 hours of nocturnal sleep. I advised her to remove the TV or not use a TV in the bedroom I like for her to remove all blue screen and backlit screen devices, she should try to advance at bedtime well prior to midnight between 10 and 11, and she may try melatonin at a dose of 5 mg or less to help her develop a sustained and continuously sleep pattern.  3)  morbid obesity -BMI 40 , needs low carb diet and exercise regimen. She has had problems with immune strength, Pneumonia and was just 2 weeks ago admitted in septic shock.      The patient was advised of the nature of the diagnosed disorder , the treatment options and the  risks for general health and wellness arising from not treating the condition.   I spent more than 45  minutes of face to face time with the patient.  Greater than 50% of time was spent in counseling and coordination of care. We have discussed the diagnosis and differential and I answered the patient's questions.    Plan:  Treatment plan and additional workup :  Split night polysomnography preferred, BCBS may only approve HST.  RV after sleep test    Larey Seat, MD 9/52/8413, 2:44 AM  Certified in Neurology by ABPN Certified in [place] by [facility] [address] [place], [place] [address]

## 2017-01-18 ENCOUNTER — Encounter: Payer: Self-pay | Admitting: Neurology

## 2017-01-18 ENCOUNTER — Ambulatory Visit (INDEPENDENT_AMBULATORY_CARE_PROVIDER_SITE_OTHER): Payer: BC Managed Care – PPO | Admitting: Neurology

## 2017-01-18 DIAGNOSIS — I10 Essential (primary) hypertension: Secondary | ICD-10-CM

## 2017-01-18 DIAGNOSIS — G4733 Obstructive sleep apnea (adult) (pediatric): Secondary | ICD-10-CM

## 2017-01-18 DIAGNOSIS — G4736 Sleep related hypoventilation in conditions classified elsewhere: Secondary | ICD-10-CM

## 2017-01-18 DIAGNOSIS — D508 Other iron deficiency anemias: Secondary | ICD-10-CM

## 2017-01-18 DIAGNOSIS — E669 Obesity, unspecified: Secondary | ICD-10-CM

## 2017-01-18 DIAGNOSIS — J1100 Influenza due to unidentified influenza virus with unspecified type of pneumonia: Secondary | ICD-10-CM

## 2017-01-18 DIAGNOSIS — Z79899 Other long term (current) drug therapy: Secondary | ICD-10-CM

## 2017-01-18 DIAGNOSIS — C73 Malignant neoplasm of thyroid gland: Secondary | ICD-10-CM

## 2017-01-18 DIAGNOSIS — N182 Chronic kidney disease, stage 2 (mild): Secondary | ICD-10-CM

## 2017-01-28 ENCOUNTER — Telehealth: Payer: Self-pay | Admitting: Neurology

## 2017-01-28 DIAGNOSIS — C73 Malignant neoplasm of thyroid gland: Secondary | ICD-10-CM | POA: Insufficient documentation

## 2017-01-28 DIAGNOSIS — J1100 Influenza due to unidentified influenza virus with unspecified type of pneumonia: Secondary | ICD-10-CM | POA: Insufficient documentation

## 2017-01-28 DIAGNOSIS — G4736 Sleep related hypoventilation in conditions classified elsewhere: Secondary | ICD-10-CM | POA: Insufficient documentation

## 2017-01-28 DIAGNOSIS — G4733 Obstructive sleep apnea (adult) (pediatric): Secondary | ICD-10-CM | POA: Insufficient documentation

## 2017-01-28 NOTE — Addendum Note (Signed)
Addended by: Larey Seat on: 01/28/2017 05:29 PM   Modules accepted: Orders

## 2017-01-28 NOTE — Procedures (Signed)
PATIENT'S NAME:  Brittany Rubio, Brittany Rubio DOB:      02/28/1961      MR#:    [id number]     DATE OF RECORDING: 01/18/2017 REFERRING M.D.:  Glendale Chard MD Study Performed:   Baseline Polysomnogram HISTORY:  Medical history is positive for diabetic nephropathy, chronic kidney disease stage II, type 2 diabetes, attention deficit disorder since childhood, hypertensive renal disease, allergic rhinitis, joint arthritis, super-obesity and obstructive sleep apnea. Mrs. Pol is postmenopausal, she does still have insomnia and her obstructive sleep apnea has been untreated for years. The patient had been diagnosed with sleep apnea and given a CPAP machine, but she was unable to obtain supplies and has not used the machine in 4 or 5 years. She has been more daytime sleepy but usually can control her sleepiness either by starting to walk around or looking for other stimulation.  The patient endorsed the Epworth Sleepiness Scale at 14/ 25 points.   The patient's weight 234 pounds with a height of 64 (inches), resulting in a BMI of 39.4 kg/m2. The patient's neck circumference measured 17 inches.  CURRENT MEDICATIONS: Adderall, Tessalon, Stadol, Vitamin D, Cyanocobalamin, Farxiga, Valium, Matzim, EpiPen, Nexium, Ferrous sulfate, Robitussin, Ibuprofen, Janumet, Synthroid, Prinizide, Desyrel, Viibryd   PROCEDURE:  This is a multichannel digital polysomnogram utilizing the SomnoStar 11.2 system.  Electrodes and sensors were applied and monitored per AASM Specifications.   EEG, EOG, Chin and Limb EMG, were sampled at 200 Hz.  ECG, Snore and Nasal Pressure, Thermal Airflow, Respiratory Effort, CPAP Flow and Pressure, Oximetry was sampled at 50 Hz. Digital video and audio were recorded.      BASELINE STUDY :  Lights Out was at 22:23 and Lights On at 05:01.  Total recording time (TRT) was 399 minutes, with a total sleep time (TST) of 244 minutes.   The patient's sleep latency was 2.5 minutes.  REM latency was 0 minutes.  The  sleep efficiency was 61.2 %.     SLEEP ARCHITECTURE: WASO (Wake after sleep onset) was 152 minutes.  There were 9.5 minutes in Stage N1, 179 minutes Stage N2, 55.5 minutes Stage N3 and 0 minutes in Stage REM.  The percentage of Stage N1 was 3.9%, Stage N2 was 73.4%, Stage N3 was 22.7% and Stage R (REM sleep) was 0%.   RESPIRATORY ANALYSIS:  There were a total of 212 respiratory events:  179 obstructive apneas, 0 central apneas and 2 mixed apneas with 31 hypopneas with 0 respiratory event related arousals (RERAs).     The total APNEA/HYPOPNEA INDEX (AHI) was 52.1/hour and the total RESPIRATORY DISTURBANCE INDEX was 52.1 /hour.  0 events occurred in REM sleep and 64 events in NREM. The REM AHI was 0 /hour, versus a non-REM AHI of 52.1. The patient spent 169.5 minutes of total sleep time in the supine position and 75 minutes in non-supine. The supine AHI was 35.1 versus a non-supine AHI of 91.0.  OXYGEN SATURATION & C02:  The Wake baseline 02 saturation was 94%, with the lowest being 76%. Time spent below 89% saturation equaled 72 minutes.  PERIODIC LIMB MOVEMENTS:  The patient had a total of 0 Periodic Limb Movements.   The arousals were noted as: 12 were spontaneous, 0 were associated with PLMs, and 62 were associated with respiratory events.  Audio and video analysis did not show any abnormal or unusual movements, behaviors, phonations or vocalizations.   The patient took one bathroom break. Snoring was noted. EKG was in keeping with normal sinus rhythm (  NSR).  Post-study, the patient indicated that sleep was the same as usual.    IMPRESSION:  1. Severe Obstructive Sleep Apnea (OSA) with non-supine exacerbation. REM sleep was not recorded.  2. OSA was associated with severe hypoxemia, prolonged total desaturation time of 72 minutes.  3. Loud Snoring.  RECOMMENDATIONS:  1. Advise full-night, attended, CPAP titration study to optimize therapy. Possible oxygen supplementation.   2. A follow up  appointment will be scheduled in the Sleep Clinic at [facility]. The referring provider will be notified of the results.      I certify that I have reviewed the entire raw data recording prior to the issuance of this report in accordance with the Standards of Accreditation of the American Academy of Sleep Medicine (AASM)      Larey Seat, MD  01-27-2017  Diplomat, American Board of Psychiatry and Neurology  Diplomat, American Board of [place] Director, [place] [facility] at [facility]  Cc Dr Baird Cancer and Nephrology office

## 2017-01-29 NOTE — Telephone Encounter (Signed)
I spoke with the patient by phone and told her about the degree of apnea before during her sleep study, she will return to town on Monday and will schedule a CPAP titration appointment as soon as she can.CD

## 2017-02-05 ENCOUNTER — Ambulatory Visit (INDEPENDENT_AMBULATORY_CARE_PROVIDER_SITE_OTHER): Payer: BC Managed Care – PPO | Admitting: Neurology

## 2017-02-05 DIAGNOSIS — E669 Obesity, unspecified: Secondary | ICD-10-CM

## 2017-02-05 DIAGNOSIS — Z79899 Other long term (current) drug therapy: Secondary | ICD-10-CM

## 2017-02-05 DIAGNOSIS — I10 Essential (primary) hypertension: Secondary | ICD-10-CM

## 2017-02-05 DIAGNOSIS — G4733 Obstructive sleep apnea (adult) (pediatric): Secondary | ICD-10-CM

## 2017-02-05 DIAGNOSIS — N182 Chronic kidney disease, stage 2 (mild): Secondary | ICD-10-CM

## 2017-02-05 DIAGNOSIS — J1100 Influenza due to unidentified influenza virus with unspecified type of pneumonia: Secondary | ICD-10-CM

## 2017-02-05 DIAGNOSIS — D508 Other iron deficiency anemias: Secondary | ICD-10-CM

## 2017-02-05 DIAGNOSIS — C73 Malignant neoplasm of thyroid gland: Secondary | ICD-10-CM

## 2017-02-09 NOTE — Procedures (Signed)
PATIENT'S NAME:  Brittany Rubio, Brittany Rubio DOB:      01/08/1961      MR#:    [id number]     DATE OF RECORDING: 02/05/2017 REFERRING M.D.:  Glendale Chard MD Study Performed:   Titration to CPAP  HISTORY:  This daytime sleepy 56 year old female patient of Dr. Baird Cancer returns for CPAP titration based on results from her PSG from 01/18/17: The total APNEA/HYPOPNEA INDEX (AHI) was 52.1/hour. The b 02 saturation was lowest at 76%. Time spent below 89% saturation equaled 72 minutes.  The patient endorsed the Epworth Sleepiness Scale at 14/25 points.   The patient's weight 234 pounds with a height of 64 (inches), resulting in a BMI of 39.9 kg/m2. The patient's neck circumference measured 17 inches.  CURRENT MEDICATIONS: Adderall, Tessalon, Stadol, Vitamin D, Cyanocobalamin, Farxiga, Valium, Matzim, EpiPen, Nexium, Ferrous sulfate, Robitussin, Ibuprofen, Janumet, Synthroid, Prinzide, Desyrel, Viibryd  PROCEDURE:  This is a multichannel digital polysomnogram utilizing the SomnoStar 11.2 system.  Electrodes and sensors were applied and monitored per AASM Specifications.   EEG, EOG, Chin and Limb EMG, were sampled at 200 Hz.  ECG, Snore and Nasal Pressure, Thermal Airflow, Respiratory Effort, CPAP Flow and Pressure, Oximetry was sampled at 50 Hz. Digital video and audio were recorded.      CPAP was initiated at 6 cmH20 with heated humidity per AASM split night standards and pressure was advanced to 12/12cmH20 because of hypopneas, apneas and desaturations.  At a PAP pressure of 12 cmH20, there was a reduction of the AHI to 0 with improvement of obstructive sleep apnea.   Lights Out was at 22:44 and Lights On at 05:00. Total recording time (TRT) was 376.5 minutes, with a total sleep time (TST) of 359.5 minutes. The patient's sleep latency was 9 minutes with 0 minutes of wake time after sleep onset. REM latency was 0 minutes.  The sleep efficiency was 95.5 %.    SLEEP ARCHITECTURE: WASO (Wake after sleep onset) was 17  minutes.  There were 13 minutes in Stage N1, 346.5 minutes Stage N2, 0 minutes Stage N3 and 0 minutes in Stage REM.  The percentage of Stage N1 was 3.6%, Stage N2 was 96.4%, Stage N3 was 0% and Stage R (REM sleep) was 0%. The sleep architecture was notable for being void of REM sleep.   RESPIRATORY ANALYSIS:  There was a total of 45 respiratory events: 0 obstructive apneas, 0 central apneas and 0 mixed apneas with a total of 0 apneas and an apnea index (AI) of 0 /hour. There were 45 hypopneas with a hypopnea index of 7.5/hour. The patient also had 0 respiratory event related arousals (RERAs).     The total APNEA/HYPOPNEA INDEX  (AHI) was 7.5 /hour and the total RESPIRATORY DISTURBANCE INDEX was 7.5 .hour  0 events occurred in REM sleep and 45 events in NREM. The REM AHI was 0 /hour versus a non-REM AHI of 7.5 /hour.  The patient spent 359.5 minutes of total sleep time in the supine position and 0 minutes in non-supine. The supine AHI was 7.5, versus a non-supine AHI of 0.0.  OXYGEN SATURATION & C02:  The baseline 02 saturation was 0%, with the lowest being 83%. Time spent below 89% saturation equaled 179 minutes.  PERIODIC LIMB MOVEMENTS:    The patient had a total of 0 Periodic Limb Movements. The arousals were noted as: 37 were spontaneous, 0 were associated with PLMs, and 45 were associated with respiratory events.  Audio and video analysis did not  show any abnormal or unusual movements, behaviors, phonations or vocalizations.  NO nocturia. Snoring was noted until CPAP pressure exceeded 9 cm water. EKG was in keeping with normal sinus rhythm (NSR).   Post-study, the patient indicated that sleep was much better than usual.   DIAGNOSIS Obstructive Sleep Apnea was responsive to CPAP at 12 cm water pressure without EPR. The patient was fitted with a ResMed AirFit P10 (small size) nasal pillow. Hypoxemia did not completely resolve until the final pressure of CPAP was reached.    PLANS/RECOMMENDATIONS: a. CPAP at 12 cm water pressure without EPR. The patient was fitted with a ResMed AirFit P10 (small size) nasal pillow. b. CPAP clinic follow up in 2-3 months after receiving device; and thereafter, yearly CPAP clinic follow-up is advisable. c. I will consider an overnight pulse-oximetry on CPAP if the patient remains excessively daytime sleepy.    DISCUSSION: A follow up appointment will be scheduled in the Sleep Clinic at [facility].   Please call [phone] with any questions.      I certify that I have reviewed the entire raw data recording prior to the issuance of this report in accordance with the Standards of Accreditation of the American Academy of Sleep Medicine (AASM)     Larey Seat, M.D.    02-09-2017  Diplomat, American Board of Psychiatry and Neurology  Diplomat, [place] of Sleep Medicine Medical Director, [place] [facility] at [facility]

## 2017-02-09 NOTE — Addendum Note (Signed)
Addended by: Larey Seat on: 02/09/2017 05:58 PM   Modules accepted: Orders

## 2017-02-11 ENCOUNTER — Telehealth: Payer: Self-pay

## 2017-02-11 NOTE — Telephone Encounter (Signed)
-----   Message from Larey Seat, MD sent at 02/09/2017  5:58 PM EDT ----- Post-study, the patient indicated that sleep was much better  than usual.   DIAGNOSIS Obstructive Sleep Apnea was responsive to CPAP at 12 cm water  pressure without EPR. The patient was fitted with a ResMed AirFit  P10 (small size) nasal pillow. Hypoxemia did not completely resolve until the final pressure of  CPAP was reached.   PLANS/RECOMMENDATIONS: a. CPAP at 12 cm water pressure without EPR. The patient was  fitted with a ResMed AirFit P10 (small size) nasal pillow. b. CPAP clinic follow up in 2-3 months after receiving device;  and thereafter, yearly CPAP clinic follow-up is advisable. c. I will consider an overnight pulse-oximetry on CPAP if the  patient remains excessively daytime sleepy.   Cc Dr Baird Cancer.

## 2017-02-11 NOTE — Telephone Encounter (Signed)
I called pt to discuss her sleep study results. No answer, left a message asking her to call me back. 

## 2017-02-11 NOTE — Telephone Encounter (Signed)
I called pt. I advised pt that Dr. Brett Fairy reviewed their sleep study results and found that pt did well with the cpap. Dr. Brett Fairy recommends that pt start a cpap at home. I reviewed PAP compliance expectations with the pt. Pt is agreeable to starting a CPAP. I advised pt that an order will be sent to a DME, [facility], and [facility] will call the pt within about one week after they file with the pt's insurance. [facility] will show the pt how to use the machine, fit for masks, and troubleshoot the CPAP if needed. A follow up appt was made for insurance purposes with Dr. Brett Fairy on 06/05/2017 at 8:30am. Pt verbalized understanding to arrive 15 minutes early and bring their CPAP. A letter with all of this information in it will be mailed to the pt as a reminder. I verified with the pt that the address we have on file is correct. Pt verbalized understanding of results. Pt had no questions at this time but was encouraged to call back if questions arise.

## 2017-06-03 ENCOUNTER — Encounter: Payer: Self-pay | Admitting: Neurology

## 2017-06-05 ENCOUNTER — Encounter (INDEPENDENT_AMBULATORY_CARE_PROVIDER_SITE_OTHER): Payer: Self-pay

## 2017-06-05 ENCOUNTER — Encounter: Payer: Self-pay | Admitting: Neurology

## 2017-06-05 ENCOUNTER — Ambulatory Visit: Payer: BC Managed Care – PPO | Admitting: Neurology

## 2017-06-05 VITALS — BP 122/73 | HR 79 | Wt 237.5 lb

## 2017-06-05 DIAGNOSIS — Z9989 Dependence on other enabling machines and devices: Secondary | ICD-10-CM

## 2017-06-05 DIAGNOSIS — E661 Drug-induced obesity: Secondary | ICD-10-CM

## 2017-06-05 DIAGNOSIS — G4733 Obstructive sleep apnea (adult) (pediatric): Secondary | ICD-10-CM | POA: Diagnosis not present

## 2017-06-05 DIAGNOSIS — Z6841 Body Mass Index (BMI) 40.0 and over, adult: Secondary | ICD-10-CM | POA: Diagnosis not present

## 2017-06-05 NOTE — Progress Notes (Signed)
[facility]   Provider:  Larey Seat, M D  Primary Care Physician:  Glendale Chard, MD   Referring Provider: Glendale Chard, MD    Chief Complaint  Patient presents with  . Follow-up    New CIPAP usesr follow up room 11 pt is with  husband    HPI:  JYL CHICO is an afro-american  56 y.o. female , seen here I a revisit.   Mrs. Sahakian follows up today on 05 June 2017 after her split-night polysomnography.  She was evaluated in the sleep test on 18 January 2017 with her diagnosis was severe obstructive sleep apnea.  The patient did not enter REM sleep and she did not have positional exacerbation.  Her AHI was 52.1/h the nonsupine AHI was surprisingly 91.  Oxygen nadir was 76% and total disorder sedation time was 72 minutes.  The patient was titrated to CPAP on 03 February 2017, CPAP was advanced to 12 cm of water with a complete resolution of apnea the patient also reached a sleep efficiency of 95.5%.  She remained devoid of any dream sleep however uses an airfit P 10.  Mrs. Kanner has become a compliant CPAP user with a complaint of 100% 4 days and 90% for time, average use of time at night is 6 hours and 5 minutes.  CPAP is set at 12 cmH2O with 3 cm expiratory pressure relief, the AHI is 3.1.  Fatigue severity scale is now endorsed at 2 points, the Epworth sleepiness score is endorsed at 12 points, the patient continues to take amphetamines in form of Adderall 30 mg XR daily.     Mrs. Slatten is a patient of Dr. Tye Savoy that underwent about 8 years ago a sleep study. The place where her sleep testing took place is no longer existing, reportedly it was close to [address], possible Dr. Konrad Felix laboratory on [address]. She felt that the CPAP was cumbersome but helpful. She felt less sleepy and her sleep may have been deeper but the machine was not easy to use or set up. She is here today partially to see if she still would need CPAP, is unclear what stage of apnea  she may be in, and also to see if the different treatment options today that were not available in 2010.  Past medical history is positive for diabetic nephropathy, chronic kidney disease stage II, type 2 diabetes attention deficit disorder since childhood, hypertensive renal disease, allergic rhinitis, joint arthritis, superobesity and obstructive sleep apnea I also reviewed briefly her list of medications. Mrs. Ribaudo is postmenopausal, she does still have insomnia and her obstructive sleep apnea has been untreated for years. The patient had been diagnosed with sleep apnea and was given a CPAP machine, but she was unable to obtain supplies and has not used the machine in 4 or 5 years. She has been more daytime sleepy but usually can control her sleepiness either by starting to walk around or looking for other stimulation.   Sleep habits are as follows: She usually watches TV for the last hour before retreating to the bedroom, between 11 and midnight. She does watches TV in the bedroom and uses her smart phone. She struggles to fall asleep, but once asleep only sleeps for about 2 or 3 hour intervals fragmented by bathroom breaks. On average 2 bathroom breaks at night. She does not have preferred sleep position, sleeps prone or supine or on the side. She sleeps with 2-3 pillows for support. She sometimes  experiences vivid dreams, and she has woken up out of dreams but rarely with palpitations or diaphoresis. She sets her alarm between 5:30 and 6 AM, she uses the snooze function several times before finally rising. She always feels that she needs more sleep and isn't restored and refreshed yet. Her nocturnal sleep time averages about 5 hours. She may take a Sunday afternoon nap but does not nap during week days. Her naps will be power naps of 30 minutes duration.  Sleep medical history and family sleep history:  Mother snored, she's not aware of anybody being diagnosed with sleep apnea, her maternal  grandmother suffered from heart disease in the paternal grandmother for breast cancer her mother died of pancreatic cancer she was in her early 78s. Her father died of meningitis - very young in 1979, smoked and drank .  Social history: Mrs. Buser is married, has one adult adopted son, she is a nonsmoker never used tobacco products she very seldomly drinks alcohol, she does use coffee in the mornings about 2 cups and sometimes tea or soda in the afternoons but not daily. She has no shift work history. She works at [employer] in [place], she is an Microbiologist.   Review of Systems: Out of a complete 14 system review, the patient complains of only the following symptoms, and all other reviewed systems are negative. Depression, insomnia, snoring, nocturia and weight gain.   Epworth score 14 , Fatigue severity score 10  , depression score 4/15    Social History   Socioeconomic History  . Marital status: Married    Spouse name: Not on file  . Number of children: Not on file  . Years of education: Not on file  . Highest education level: Not on file  Social Needs  . Financial resource strain: Not on file  . Food insecurity - worry: Not on file  . Food insecurity - inability: Not on file  . Transportation needs - medical: Not on file  . Transportation needs - non-medical: Not on file  Occupational History  . Not on file  Tobacco Use  . Smoking status: Never Smoker  . Smokeless tobacco: Never Used  Substance and Sexual Activity  . Alcohol use: Yes    Comment: 07/07/2014 "a few times/year; weddings, anniversary, etc"  . Drug use: No  . Sexual activity: Yes  Other Topics Concern  . Not on file  Social History Narrative  . Not on file    Family History  Problem Relation Age of Onset  . Cancer Mother        Pancreatic    Past Medical History:  Diagnosis Date  . Arthritis    "left shoulder" (07/07/2014)  . Breast cancer ([place])    "left"  . GERD  (gastroesophageal reflux disease)    "takes over the counters as needed"  . High cholesterol   . Hypertension 11/26/2011   sees Dr. Bryon Lions  . Migraines    "maybe once q 3 months" (07/07/2014)  . Multinodular thyroid 2015  . OSA on CPAP   . Personal history of radiation therapy   . Type II diabetes mellitus ([place])     Past Surgical History:  Procedure Laterality Date  . ABDOMINAL HYSTERECTOMY  ? 1997  . BREAST EXCISIONAL BIOPSY Right 2014  . BREAST LUMPECTOMY Left 2009  . BREAST LUMPECTOMY Right 07/2012   "not a mastectomy"  . [place]   "trauma"  . PARTIAL MASTECTOMY WITH NEEDLE  LOCALIZATION  08/12/2012   Procedure: PARTIAL MASTECTOMY WITH NEEDLE LOCALIZATION;  Surgeon: Marcello Moores A. Cornett, MD;  Location: [facility];  Service: General;  Laterality: Right;  . REDUCTION MAMMAPLASTY Bilateral 1995  . THYROIDECTOMY Left 07/07/2014   Procedure: LEFT HEMI-THYROIDECTOMY;  Surgeon: Ascencion Dike, MD;  Location: [facility];  Service: ENT;  Laterality: Left;  . THYROIDECTOMY Right 07/08/2014   Procedure: Completion THYROIDECTOMY;  Surgeon: Ascencion Dike, MD;  Location: [facility];  Service: ENT;  Laterality: Right;  . THYROIDECTOMY, PARTIAL Left 07/07/2014   hemi    Current Outpatient Medications  Medication Sig Dispense Refill  . amphetamine-dextroamphetamine (ADDERALL) 20 MG tablet Take 20 mg by mouth daily.     . butorphanol (STADOL) 10 MG/ML nasal spray Place 1 spray into the nose every 4 (four) hours as needed. For migraines    . cholecalciferol (VITAMIN D) 1000 units tablet Take 1,000 Units by mouth daily.    . cyanocobalamin 500 MCG tablet Take 500 mcg by mouth daily.    . Dapagliflozin Propanediol (FARXIGA) 10 MG TABS Take 10 mg by mouth daily.    . diazepam (VALIUM) 5 MG tablet Take 2.5-5 mg by mouth daily as needed for anxiety.  1  . diltiazem (MATZIM LA) 240 MG 24 hr tablet Take 240 mg by mouth at bedtime.     Marland Kitchen EPINEPHrine (EPIPEN 2-PAK) 0.3 mg/0.3 mL IJ SOAJ injection 0.3 mLs  by Subdermal route once as needed. Allergic reaction    . esomeprazole (NEXIUM) 20 MG capsule Take 20 mg by mouth daily at 12 noon.    . ferrous sulfate 325 (65 FE) MG tablet Take 325 mg by mouth daily as needed. Low iron    . guaiFENesin-dextromethorphan (ROBITUSSIN DM) 100-10 MG/5ML syrup Take 5 mLs by mouth every 4 (four) hours as needed for cough. 118 mL 0  . ibuprofen (ADVIL,MOTRIN) 800 MG tablet Take 1 tablet (800 mg total) by mouth 3 (three) times daily. (Patient taking differently: Take 800 mg by mouth 2 (two) times daily as needed for mild pain. ) 21 tablet 0  . JANUMET 50-1000 MG tablet Take 1 tablet by mouth 2 (two) times daily with a meal.  1  . levothyroxine (SYNTHROID, LEVOTHROID) 112 MCG tablet Take 224 mcg by mouth every morning.     Marland Kitchen lisinopril-hydrochlorothiazide (PRINZIDE,ZESTORETIC) 20-25 MG tablet Take 1 tablet by mouth daily.     Marland Kitchen LIVALO 4 MG TABS Take 4 mg by mouth daily.   5  . traZODone (DESYREL) 50 MG tablet Take 100 mg by mouth at bedtime.   1  . VIIBRYD 40 MG TABS Take 40 mg by mouth at bedtime.  3  . promethazine (PHENERGAN) 25 MG tablet TAKE 1 TABLET EVERY 4 TO 6 HOURS AS NEEDED  0   No current facility-administered medications for this visit.     Allergies as of 06/05/2017 - Review Complete 06/05/2017  Allergen Reaction Noted  . Iodine  07/16/1898  . Shellfish allergy Swelling 08/05/2012    Vitals: BP 122/73 (BP Location: Right Arm, Patient Position: Sitting, Cuff Size: Normal)   Pulse 79   Wt 237 lb 8 oz (107.7 kg)   BMI 40.14 kg/m  Last Weight:  Wt Readings from Last 1 Encounters:  06/05/17 237 lb 8 oz (107.7 kg)   IDP:OEUM mass index is 40.14 kg/m.     Last Height:   Ht Readings from Last 1 Encounters:  12/13/16 5' 4.5" (1.638 m)    Physical exam:  General:  The patient is awake, alert and appears not in acute distress. The patient is well groomed. Head: Normocephalic, atraumatic. Neck is supple. Mallampati 4 neck circumference:17. Nasal  airflow patent . Retrognathia is seen.  Cardiovascular:  Regular rate and rhythm- without  murmurs or carotid bruit, and without distended neck veins. Respiratory: Lungs are clear to auscultation. Skin:  Without evidence of edema, or rash Trunk: BMI is 40. The patient's posture is erect   Neurologic exam : The patient is awake and alert, oriented to place and time.   Attention span & concentration ability appears normal. She has problems multitasking  Speech is fluent,  without dysarthria, dysphonia or aphasia.  Mood and affect are appropriate.  Cranial nerves: Pupils are equal and briskly reactive to light.  Facial sensation intact to fine touch.  Facial motor strength is symmetric and tongue and uvula move midline. Shoulder shrug was symmetrical.  Deep tendon reflexes: in the  upper and lower extremities are symmetric and intact. Babinski maneuver response is  Downgoing.   Mrs. Shiffman had just been admitted to hospital with pneumonia and septic shock at [facility]. I reviewed the notes from [place] , [place].  Mrs. Neyer was hospitalized on 11-26-2016.  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 Diagnosed by Dr. Benjamine Mola.    Assessment:  After physical and neurologic examination, review of laboratory studies,  Personal review of imaging studies, reports of other /same  Imaging studies, results of polysomnography and / or neurophysiology testing and pre-existing records as far as provided in visit., my assessment is   1)  OSA severe- now compliant on CPAP 90% with resolution of AHI, and decreased daytime fatigue - EDS still endorsed at 12 points on Epworth   2) sleep hygiene. Mrs. Aaron Edelman at this time is considered to be in correlative sleep deprivation as she usually gets 5-5-1/2 hours of nocturnal sleep. I advised her to remove the TV or not use a TV in the bedroom I like for her to remove  all blue screen and backlit screen devices, she should try to advance at bedtime well prior to midnight between 10 and 11, and she may try melatonin at a dose of 5 mg or less to help her develop a sustained and continuously sleep pattern.  3) morbid obesity -BMI 40 , needs low carb diet and exercise regimen.    The patient was advised of the nature of the diagnosed disorder , the treatment options and the  risks for general health and wellness arising from not treating the condition.   I spent more than 15  minutes of face to face time with the patient.  Greater than 50% of time was spent in counseling and coordination of care. We have discussed the diagnosis and differential and I answered the patient's questions.    Rv in 12 month with NP.   Larey Seat, MD 27/78/2423, 5:36 AM  Certified in Neurology by [employer]BP[employer] Certified in [place] by [facility] [address] [place], [place] [address]

## 2017-12-11 ENCOUNTER — Other Ambulatory Visit: Payer: Self-pay | Admitting: Internal Medicine

## 2017-12-11 ENCOUNTER — Ambulatory Visit
Admission: RE | Admit: 2017-12-11 | Discharge: 2017-12-11 | Disposition: A | Discharge status: Home / Self Care | Payer: BC Managed Care – PPO | Source: Ambulatory Visit | Attending: Internal Medicine | Admitting: Internal Medicine

## 2017-12-11 DIAGNOSIS — R05 Cough: Secondary | ICD-10-CM

## 2017-12-11 DIAGNOSIS — R059 Cough, unspecified: Secondary | ICD-10-CM

## 2018-01-08 LAB — HEPATIC FUNCTION PANEL
ALT: 45 — AB (ref 7–35)
AST: 31 (ref 13–35)
Alkaline Phosphatase: 55 (ref 25–125)

## 2018-01-08 LAB — BASIC METABOLIC PANEL
BUN: 16 (ref 4–21)
Creatinine: 1 (ref 0.5–1.1)
Glucose: 150
Potassium: 5 (ref 3.4–5.3)
Sodium: 142 (ref 137–147)

## 2018-01-08 LAB — HEMOGLOBIN A1C: Hgb A1c MFr Bld: 9.2 — AB (ref 4.0–6.0)

## 2018-01-23 ENCOUNTER — Other Ambulatory Visit: Payer: Self-pay | Admitting: Internal Medicine

## 2018-01-23 DIAGNOSIS — K469 Unspecified abdominal hernia without obstruction or gangrene: Secondary | ICD-10-CM

## 2018-02-13 ENCOUNTER — Other Ambulatory Visit: Payer: Self-pay | Admitting: Internal Medicine

## 2018-02-13 DIAGNOSIS — Z1231 Encounter for screening mammogram for malignant neoplasm of breast: Secondary | ICD-10-CM

## 2018-02-14 ENCOUNTER — Ambulatory Visit
Admission: RE | Admit: 2018-02-14 | Discharge: 2018-02-14 | Disposition: A | Discharge status: Home / Self Care | Payer: BC Managed Care – PPO | Source: Ambulatory Visit | Attending: Internal Medicine | Admitting: Internal Medicine

## 2018-02-14 DIAGNOSIS — K469 Unspecified abdominal hernia without obstruction or gangrene: Secondary | ICD-10-CM

## 2018-02-14 MED ORDER — IOPAMIDOL (ISOVUE-300) INJECTION 61%
125.0000 mL | Freq: Once | INTRAVENOUS | Status: AC | PRN
  Administered 2018-02-14: 125 mL via INTRAVENOUS

## 2018-03-18 ENCOUNTER — Ambulatory Visit: Payer: Self-pay | Admitting: Surgery
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
## 2018-03-31 ENCOUNTER — Ambulatory Visit
Admission: RE | Admit: 2018-03-31 | Discharge: 2018-03-31 | Disposition: A | Discharge status: Home / Self Care | Payer: BC Managed Care – PPO | Source: Ambulatory Visit | Attending: Internal Medicine | Admitting: Internal Medicine

## 2018-03-31 DIAGNOSIS — Z1231 Encounter for screening mammogram for malignant neoplasm of breast: Secondary | ICD-10-CM

## 2018-04-01 ENCOUNTER — Encounter (INDEPENDENT_AMBULATORY_CARE_PROVIDER_SITE_OTHER): Payer: BC Managed Care – PPO

## 2018-04-02 ENCOUNTER — Other Ambulatory Visit: Payer: Self-pay | Admitting: Internal Medicine

## 2018-04-02 DIAGNOSIS — R928 Other abnormal and inconclusive findings on diagnostic imaging of breast: Secondary | ICD-10-CM

## 2018-04-08 ENCOUNTER — Ambulatory Visit
Admission: RE | Admit: 2018-04-08 | Discharge: 2018-04-08 | Disposition: A | Discharge status: Home / Self Care | Payer: BC Managed Care – PPO | Source: Ambulatory Visit | Attending: Internal Medicine | Admitting: Internal Medicine

## 2018-04-08 DIAGNOSIS — R928 Other abnormal and inconclusive findings on diagnostic imaging of breast: Secondary | ICD-10-CM

## 2018-04-10 ENCOUNTER — Encounter (INDEPENDENT_AMBULATORY_CARE_PROVIDER_SITE_OTHER): Payer: BC Managed Care – PPO

## 2018-04-16 ENCOUNTER — Other Ambulatory Visit: Payer: Self-pay | Admitting: Internal Medicine

## 2018-04-17 ENCOUNTER — Encounter (INDEPENDENT_AMBULATORY_CARE_PROVIDER_SITE_OTHER): Payer: Self-pay | Admitting: Family Medicine

## 2018-04-17 ENCOUNTER — Ambulatory Visit (INDEPENDENT_AMBULATORY_CARE_PROVIDER_SITE_OTHER): Payer: BC Managed Care – PPO | Admitting: Family Medicine

## 2018-04-17 ENCOUNTER — Ambulatory Visit (INDEPENDENT_AMBULATORY_CARE_PROVIDER_SITE_OTHER): Payer: BC Managed Care – PPO | Admitting: Bariatrics

## 2018-04-17 VITALS — BP 120/70 | HR 83 | Ht 65.0 in | Wt 221.0 lb

## 2018-04-17 DIAGNOSIS — E119 Type 2 diabetes mellitus without complications: Secondary | ICD-10-CM | POA: Diagnosis not present

## 2018-04-17 DIAGNOSIS — R5383 Other fatigue: Secondary | ICD-10-CM | POA: Diagnosis not present

## 2018-04-17 DIAGNOSIS — E038 Other specified hypothyroidism: Secondary | ICD-10-CM

## 2018-04-17 DIAGNOSIS — Z1331 Encounter for screening for depression: Secondary | ICD-10-CM | POA: Diagnosis not present

## 2018-04-17 DIAGNOSIS — R011 Cardiac murmur, unspecified: Secondary | ICD-10-CM | POA: Diagnosis not present

## 2018-04-17 DIAGNOSIS — Z9189 Other specified personal risk factors, not elsewhere classified: Secondary | ICD-10-CM | POA: Diagnosis not present

## 2018-04-17 DIAGNOSIS — R0602 Shortness of breath: Secondary | ICD-10-CM | POA: Diagnosis not present

## 2018-04-17 DIAGNOSIS — Z6836 Body mass index (BMI) 36.0-36.9, adult: Secondary | ICD-10-CM

## 2018-04-17 NOTE — Progress Notes (Signed)
Office: [phone]  /  Fax: [phone]   Dear Dr. Baird Cancer,   Thank you for referring Larina Bras to our clinic. The following note includes my evaluation and treatment recommendations.  HPI:   Chief Complaint: OBESITY    MIAYA LAFONTANT has been referred by Glendale Chard, MD for consultation regarding her obesity and obesity related comorbidities.    VIRGIA KELNER (MR# [id number]) is a 57 y.o. female who presents on 04/17/2018 for obesity evaluation and treatment. Current BMI is Body mass index is 36.78 kg/m.Marland Kitchen Nicey has been struggling with her weight for many years and has been unsuccessful in either losing weight, maintaining weight loss, or reaching her healthy weight goal. Aashi eats out daily and she is very resistant to changing this.     Autumne attended our information session and states she is currently in the action stage of change and ready to dedicate time achieving and maintaining a healthier weight. Brendi is interested in becoming our patient and working on intensive lifestyle modifications including (but not limited to) diet, exercise and weight loss.    Brandilyn states her family eats meals together she thinks her family will eat healthier with  her her desired weight loss is 85 lbs she has been heavy most of  her life she started gaining weight at 24 yrs of age her heaviest weight ever was 244 lbs. she has significant food cravings issues  she snacks frequently in the evenings she skips meals frequently she is frequently drinking liquids with calories she frequently makes poor food choices she frequently eats larger portions than normal  she struggles with emotional eating    Fatigue Markee feels her energy is lower than it should be. This has worsened with weight gain and has not worsened recently. Lakya admits to daytime somnolence and  admits to waking up still tired. Patient has a history of obstructive sleep apnea, which may  contribute to her fatigue. Patent has a history of symptoms of daytime fatigue, morning fatigue and morning headache. Patient generally gets 4 or 5 hours of sleep per night, and states they generally have better sleep with CPAP. Snoring is present. Apneic episodes are present. Epworth Sleepiness Score is 10  Dyspnea on exertion [place] notes increasing shortness of breath with exercising and seems to be worsening over time with weight gain. She notes getting out of breath sooner with activity than she used to. This has not gotten worse recently. Terryann denies orthopnea.  Diabetes II Iasia has a diagnosis of diabetes type II. Arah is not checking her blood sugar at home and there are no recent labs, so her control is unknown. Crimson denies any hypoglycemic episodes. She has been working on intensive lifestyle modifications including diet, exercise, and weight loss to help control her blood glucose levels.  New Onset Heart Murmur Nekeisha has a new onset heart murmur. She has no previous cardiac history, but she has diabetes. [place] admits to shortness of breath and she has 1+ bilateral lower extremity edema. Her blood pressure is stable today.  At risk for cardiovascular disease Tayla is at a higher than average risk for cardiovascular disease due to obesity, diabetes and heart murmur. She currently denies any chest pain.  Hypothyroid Tonya has a diagnosis of hypothyroidism. She is status post thyroid cancer and thyroidectomy. She is on levothyroxine. She denies palpitations.  Depression Screen Shiesha's Food and Mood (modified PHQ-9) score was  Depression screen PHQ 2/9 04/17/2018  Decreased Interest 1  Down, Depressed,  Hopeless 1  PHQ - 2 Score 2  Altered sleeping 3  Tired, decreased energy 3  Change in appetite 0  Feeling bad or failure about yourself  1  Trouble concentrating 2  Moving slowly or fidgety/restless 3  Suicidal thoughts 0  PHQ-9 Score 14  Difficult  doing work/chores Somewhat difficult    ALLERGIES: Allergies  Allergen Reactions  . Shellfish Allergy Hives and Swelling    "Only when I eat too many crab legs" Can tolerate CT scans with contrast-does not need premeds      MEDICATIONS: Current Outpatient Medications on File Prior to Visit  Medication Sig Dispense Refill  . amphetamine-dextroamphetamine (ADDERALL) 20 MG tablet Take 20 mg by mouth daily.     Marland Kitchen aspirin EC 81 MG tablet Take 81 mg by mouth daily.    . cholecalciferol (VITAMIN D) 1000 units tablet Take 1,000 Units by mouth daily.    . diazepam (VALIUM) 5 MG tablet Take 2.5-5 mg by mouth daily as needed for anxiety.  1  . diltiazem (MATZIM LA) 240 MG 24 hr tablet Take 240 mg by mouth at bedtime.     Marland Kitchen esomeprazole (NEXIUM) 20 MG capsule Take 20 mg by mouth daily at 12 noon.    Marland Kitchen FARXIGA 10 MG TABS tablet TAKE 1 TABLET BY MOUTH EVERY DAY 30 tablet 5  . JANUMET 50-1000 MG tablet Take 1 tablet by mouth 2 (two) times daily with a meal.  1  . levothyroxine (SYNTHROID, LEVOTHROID) 112 MCG tablet Take 224 mcg by mouth every morning.     Marland Kitchen lisinopril-hydrochlorothiazide (PRINZIDE,ZESTORETIC) 20-25 MG tablet Take 1 tablet by mouth daily.     . Magnesium 500 MG CAPS Take 1 capsule by mouth daily.    . promethazine (PHENERGAN) 25 MG tablet TAKE 1 TABLET EVERY 4 TO 6 HOURS AS NEEDED  0  . ranitidine (ZANTAC) 150 MG tablet Take 150 mg by mouth 2 (two) times daily.    . traZODone (DESYREL) 50 MG tablet Take 100 mg by mouth at bedtime.   1  . Turmeric Curcumin 500 MG CAPS Take 1 capsule by mouth daily.    Marland Kitchen VIIBRYD 40 MG TABS Take 40 mg by mouth at bedtime.  3  . vitamin C (ASCORBIC ACID) 500 MG tablet Take 500 mg by mouth daily.    Marland Kitchen EPINEPHrine (EPIPEN 2-PAK) 0.3 mg/0.3 mL IJ SOAJ injection 0.3 mLs by Subdermal route once as needed. Allergic reaction     No current facility-administered medications on file prior to visit.     PAST MEDICAL HISTORY: Past Medical History:  Diagnosis  Date  . ADD (attention deficit disorder)   . Anemia   . Anxiety   . Arthritis    "left shoulder" (07/07/2014)  . Back pain   . Breast cancer ([place])    "left"  . Depression   . Fatty liver   . Food allergy    shellfish  . GERD (gastroesophageal reflux disease)    "takes over the counters as needed"  . Hernia, umbilical   . High cholesterol   . Hypertension 11/26/2011   sees Dr. Bryon Lions  . Joint pain   . Migraines    "maybe once q 3 months" (07/07/2014)  . Multinodular thyroid 2015  . OSA on CPAP   . Personal history of radiation therapy   . Type II diabetes mellitus ([place])   . Vitamin D deficiency     PAST SURGICAL HISTORY: Past Surgical History:  Procedure Laterality  Date  . ABDOMINAL HYSTERECTOMY  ? 1997  . BREAST EXCISIONAL BIOPSY Right 2014  . BREAST LUMPECTOMY Left 2009  . BREAST LUMPECTOMY Right 07/2012   "not a mastectomy"  . [place]   "trauma"  . PARTIAL MASTECTOMY WITH NEEDLE LOCALIZATION  08/12/2012   Procedure: PARTIAL MASTECTOMY WITH NEEDLE LOCALIZATION;  Surgeon: Marcello Moores A. Cornett, MD;  Location: [facility];  Service: General;  Laterality: Right;  . REDUCTION MAMMAPLASTY Bilateral 1995  . THYROIDECTOMY Left 07/07/2014   Procedure: LEFT HEMI-THYROIDECTOMY;  Surgeon: Ascencion Dike, MD;  Location: [facility];  Service: ENT;  Laterality: Left;  . THYROIDECTOMY Right 07/08/2014   Procedure: Completion THYROIDECTOMY;  Surgeon: Ascencion Dike, MD;  Location: [facility];  Service: ENT;  Laterality: Right;  . THYROIDECTOMY, PARTIAL Left 07/07/2014   hemi    SOCIAL HISTORY: Social History   Tobacco Use  . Smoking status: Never Smoker  . Smokeless tobacco: Never Used  Substance Use Topics  . Alcohol use: Yes    Comment: 07/07/2014 "a few times/year; weddings, anniversary, etc"  . Drug use: No    FAMILY HISTORY: Family History  Problem Relation Age of Onset  . Cancer Mother        Pancreatic  . Diabetes Mother   . Hypertension Mother   . Depression  Mother   . Hypertension Father   . Alcoholism Father     ROS: Review of Systems  Constitutional: Positive for malaise/fatigue.       + Breasts Lumps  HENT:       + Dry Mouth  Eyes:       + Wear Glasses or Contacts  Respiratory: Positive for shortness of breath (on exertion).   Cardiovascular: Negative for palpitations and orthopnea.       + Leg Cramping  Gastrointestinal: Positive for heartburn and nausea.  Musculoskeletal: Positive for back pain.       + Muscle or Joint Pain  Neurological: Positive for headaches.  Endo/Heme/Allergies:       Negative for hypoglycemia  Psychiatric/Behavioral: Positive for depression.    PHYSICAL EXAM: Blood pressure 120/70, pulse 83, height 5\' 5"  (1.651 m), weight 221 lb (100.2 kg), SpO2 98 %. Body mass index is 36.78 kg/m. Physical Exam  Constitutional: She is oriented to person, place, and time. She appears well-developed and well-nourished.  HENT:  Head: Normocephalic and atraumatic.  Nose: Nose normal.  Eyes: EOM are normal. No scleral icterus.  Neck: Normal range of motion. Neck supple. No thyromegaly present.  Cardiovascular: Normal rate.  Murmur heard.  Systolic (early) murmur is present with a grade of 2/6. Pulmonary/Chest: Effort normal. No respiratory distress.  Abdominal: Soft. There is no tenderness.  + Obesity  Musculoskeletal: Normal range of motion. She exhibits edema (1+ edema bilateral lower extremities).  Range of Motion normal in all 4 extremities  Neurological: She is alert and oriented to person, place, and time. Coordination normal.  Skin: Skin is warm and dry.  Psychiatric: She has a normal mood and affect. Her behavior is normal.  Vitals reviewed.   RECENT LABS AND TESTS: BMET    Component Value Date/Time   NA 136 11/30/2016 0514   NA 143 05/28/2013 0844   K 3.5 11/30/2016 0514   K 4.4 05/28/2013 0844   CL 95 (L) 11/30/2016 0514   CL 99 11/20/2012 0940   CO2 30 11/30/2016 0514   CO2 28 05/28/2013  0844   GLUCOSE 168 (H) 11/30/2016 0514   GLUCOSE 94  05/28/2013 0844   GLUCOSE 142 (H) 11/20/2012 0940   BUN 10 11/30/2016 0514   BUN 16.9 05/28/2013 0844   CREATININE 0.75 11/30/2016 0514   CREATININE 0.8 05/28/2013 0844   CALCIUM 9.0 11/30/2016 0514   CALCIUM 9.8 05/28/2013 0844   GFRNONAA >60 11/30/2016 0514   GFRAA >60 11/30/2016 0514   No results found for: HGBA1C No results found for: INSULIN CBC    Component Value Date/Time   WBC 16.1 (H) 12/02/2016 0742   RBC 4.67 12/02/2016 0742   HGB 10.7 (L) 12/02/2016 0742   HGB 12.9 05/28/2013 0844   HCT 33.0 (L) 12/02/2016 0742   HCT 40.8 05/28/2013 0844   PLT 293 12/02/2016 0742   PLT 317 05/28/2013 0844   MCV 70.7 (L) 12/02/2016 0742   MCV 72.1 (L) 05/28/2013 0844   MCH 22.9 (L) 12/02/2016 0742   MCHC 32.4 12/02/2016 0742   RDW 17.1 (H) 12/02/2016 0742   RDW 17.6 (H) 05/28/2013 0844   LYMPHSABS 1.3 11/26/2016 1836   LYMPHSABS 1.7 05/28/2013 0844   MONOABS 0.2 11/26/2016 1836   MONOABS 0.8 05/28/2013 0844   EOSABS 0.0 11/26/2016 1836   EOSABS 0.2 05/28/2013 0844   BASOSABS 0.0 11/26/2016 1836   BASOSABS 0.1 05/28/2013 0844   Iron/TIBC/Ferritin/ %Sat    Component Value Date/Time   FERRITIN 25 07/05/2011 0844   Lipid Panel  No results found for: CHOL, TRIG, HDL, CHOLHDL, VLDL, LDLCALC, LDLDIRECT Hepatic Function Panel     Component Value Date/Time   PROT 7.9 05/28/2013 0844   ALBUMIN 3.7 05/28/2013 0844   AST 15 05/28/2013 0844   ALT 23 05/28/2013 0844   ALKPHOS 58 05/28/2013 0844   BILITOT 0.28 05/28/2013 0844   No results found for: TSH   Vitamin D Results for RONNELL, MAKAREWICZ (MRN [id number]) as of 04/17/2018 11:23  Ref. Range 06/19/2010 08:57  Vitamin D, 25-Hydroxy Latest Ref Range: 30 - 89 ng/mL 66    ECG  shows NSR with a rate of 80 BPM INDIRECT CALORIMETER done today shows a VO2 of 240 and a REE of 1669.  Her calculated basal metabolic rate is 7510 thus her basal metabolic rate is better than  expected.    ASSESSMENT AND PLAN: Other fatigue - Plan: EKG 12-Lead, Vitamin B12, CBC With Differential, Folate, Lipid Panel With LDL/HDL Ratio, VITAMIN D 25 Hydroxy (Vit-D Deficiency, Fractures)  Type 2 diabetes mellitus without complication, without long-term current use of insulin (HCC) - Plan: Comprehensive metabolic panel, Hemoglobin A1c, Insulin, random, Microalbumin / creatinine urine ratio  Newly recognized heart murmur  Other specified hypothyroidism - Plan: Lipid Panel With LDL/HDL Ratio, T3, T4, free, TSH  Depression screening  At risk for heart disease  Class 2 severe obesity with serious comorbidity and body mass index (BMI) of 36.0 to 36.9 in adult, unspecified obesity type ([place])  PLAN: Fatigue Elisabella was informed that her fatigue may be related to obesity, depression or many other causes. Labs will be ordered, and in the meanwhile [place] has agreed to work on diet, exercise and weight loss to help with fatigue. Proper sleep hygiene was discussed including the need for 7-8 hours of quality sleep each night. A sleep study was not ordered based on symptoms and Epworth score.  Dyspnea on exertion Cystal's shortness of breath appears to be obesity related and exercise induced. She has agreed to work on weight loss and gradually increase exercise to treat her exercise induced shortness of breath. If [place] follows our instructions and  loses weight without improvement of her shortness of breath, we will plan to refer to pulmonology. We will monitor this condition regularly. Mark agrees to this plan.  Diabetes II Rebeca has been given extensive diabetes education by myself today including ideal fasting and post-prandial blood glucose readings, individual ideal Hgb A1c goals and hypoglycemia prevention. We discussed the importance of good blood sugar control to decrease the likelihood of diabetic complications such as nephropathy, neuropathy, limb loss, blindness,  coronary artery disease, and death. We discussed the importance of intensive lifestyle modification including diet, exercise and weight loss as the first line treatment for diabetes. Shawntee agrees to check her blood sugar at home two times daily and start diet prescription. We will check labs and Jennyfer will follow up at the agreed upon time.  New Onset Heart Murmur We will order echocardiogram and Tyjai agrees to follow up with our clinic in 2 weeks.  Cardiovascular risk counseling Kemonie was given extended (15 minutes) coronary artery disease prevention counseling today. She is 57 y.o. female and has risk factors for heart disease including obesity, diabetes and heart murmur. We discussed intensive lifestyle modifications today with an emphasis on specific weight loss instructions and strategies. Pt was also informed of the importance of increasing exercise and decreasing saturated fats to help prevent heart disease.  Hypothyroid Dayanira was informed of the importance of good thyroid control to help with weight loss efforts. She was also informed that supertheraputic thyroid levels are dangerous and will not improve weight loss results. Labs will be obtained and results will be discussed with Verdene Lennert in 2 weeks at her follow up visit.  Depression Screen Lenola had a moderately positive depression screening. Depression is commonly associated with obesity and often results in emotional eating behaviors. We will monitor this closely and work on CBT to help improve the non-hunger eating patterns. Referral to Psychology may be required if no improvement is seen as she continues in our clinic.  Obesity Noella is currently in the action stage of change and her goal is to continue with weight loss efforts. I recommend Abigale begin the structured treatment plan as follows:  She has agreed to follow the Category 2 plan Kariyah has been instructed to eventually work up to a goal of 150  minutes of combined cardio and strengthening exercise per week for weight loss and overall health benefits. We discussed the following Behavioral Modification Strategies today: increasing lean protein intake, decreasing simple carbohydrates , decrease eating out and work on meal planning and easy cooking plans  I emphasized to patient that eating out was not compatible with weight loss and she was reassured that we would help her make the changes she needs.    She was informed of the importance of frequent follow up visits to maximize her success with intensive lifestyle modifications for her multiple health conditions. She was informed we would discuss her lab results at her next visit unless there is a critical issue that needs to be addressed sooner. Telisa agreed to keep her next visit at the agreed upon time to discuss these results.    OBESITY BEHAVIORAL INTERVENTION VISIT  Today's visit was # 1   Starting weight: 221 lbs Starting date: 04/17/18 Today's weight : 221 lbs  Today's date: 04/17/2018 Total lbs lost to date: 0   ASK: We discussed the diagnosis of obesity with Larina Bras today and [place] agreed to give [place] permission to discuss obesity behavioral modification therapy today.  ASSESS: Tamberly has  the diagnosis of obesity and her BMI today is 36.78 Vincentina is in the action stage of change   ADVISE: Nahima was educated on the multiple health risks of obesity as well as the benefit of weight loss to improve her health. She was advised of the need for long term treatment and the importance of lifestyle modifications to improve her current health and to decrease her risk of future health problems.  AGREE: Multiple dietary modification options and treatment options were discussed and  Milayah agreed to follow the recommendations documented in the above note.  ARRANGE: Amilah was educated on the importance of frequent visits to treat obesity as outlined per  CMS and USPSTF guidelines and agreed to schedule her next follow up appointment today.  I, Doreene Nest, am acting as transcriptionist for Dennard Nip, MD   I have reviewed the above documentation for accuracy and completeness, and I agree with the above. -Dennard Nip, MD

## 2018-04-18 LAB — COMPREHENSIVE METABOLIC PANEL
ALT: 37 IU/L — ABNORMAL HIGH (ref 0–32)
AST: 25 IU/L (ref 0–40)
Albumin/Globulin Ratio: 1.4 (ref 1.2–2.2)
Albumin: 4.3 g/dL (ref 3.5–5.5)
Alkaline Phosphatase: 56 IU/L (ref 39–117)
BUN/Creatinine Ratio: 16 (ref 9–23)
BUN: 16 mg/dL (ref 6–24)
Bilirubin Total: <0.2 mg/dL (ref 0.0–1.2)
CO2: 25 mmol/L (ref 20–29)
Calcium: 9.8 mg/dL (ref 8.7–10.2)
Chloride: 97 mmol/L (ref 96–106)
Creatinine, Ser: 0.99 mg/dL (ref 0.57–1.00)
GFR calc Af Amer: 73 mL/min/{1.73_m2} (ref >59)
GFR calc non Af Amer: 63 mL/min/{1.73_m2} (ref >59)
Globulin, Total: 3.1 g/dL (ref 1.5–4.5)
Glucose: 133 mg/dL — ABNORMAL HIGH (ref 65–99)
Potassium: 4.5 mmol/L (ref 3.5–5.2)
Sodium: 141 mmol/L (ref 134–144)
Total Protein: 7.4 g/dL (ref 6.0–8.5)

## 2018-04-18 LAB — CBC WITH DIFFERENTIAL
Basophils Absolute: 0.1 10*3/uL (ref 0.0–0.2)
Basos: 1 %
EOS (ABSOLUTE): 0.2 10*3/uL (ref 0.0–0.4)
Eos: 2 %
Hematocrit: 38.5 % (ref 34.0–46.6)
Hemoglobin: 12.2 g/dL (ref 11.1–15.9)
Immature Grans (Abs): 0 10*3/uL (ref 0.0–0.1)
Immature Granulocytes: 0 %
Lymphocytes Absolute: 1.4 10*3/uL (ref 0.7–3.1)
Lymphs: 17 %
MCH: 23.1 pg — ABNORMAL LOW (ref 26.6–33.0)
MCHC: 31.7 g/dL (ref 31.5–35.7)
MCV: 73 fL — ABNORMAL LOW (ref 79–97)
Monocytes Absolute: 0.7 10*3/uL (ref 0.1–0.9)
Monocytes: 9 %
Neutrophils Absolute: 5.5 10*3/uL (ref 1.4–7.0)
Neutrophils: 71 %
RBC: 5.29 x10E6/uL — ABNORMAL HIGH (ref 3.77–5.28)
RDW: 16 % — ABNORMAL HIGH (ref 12.3–15.4)
WBC: 7.9 10*3/uL (ref 3.4–10.8)

## 2018-04-18 LAB — HEMOGLOBIN A1C
Est. average glucose Bld gHb Est-mCnc: 194 mg/dL
Hgb A1c MFr Bld: 8.4 % — ABNORMAL HIGH (ref 4.8–5.6)

## 2018-04-18 LAB — LIPID PANEL WITH LDL/HDL RATIO
Cholesterol, Total: 202 mg/dL — ABNORMAL HIGH (ref 100–199)
HDL: 50 mg/dL (ref >39)
LDL Calculated: 128 mg/dL — ABNORMAL HIGH (ref 0–99)
LDl/HDL Ratio: 2.6 ratio (ref 0.0–3.2)
Triglycerides: 122 mg/dL (ref 0–149)
VLDL Cholesterol Cal: 24 mg/dL (ref 5–40)

## 2018-04-18 LAB — T3: T3, Total: 105 ng/dL (ref 71–180)

## 2018-04-18 LAB — FOLATE: Folate: 12.6 ng/mL (ref >3.0)

## 2018-04-18 LAB — TSH: TSH: 0.449 u[IU]/mL — ABNORMAL LOW (ref 0.450–4.500)

## 2018-04-18 LAB — VITAMIN B12: Vitamin B-12: >2000 pg/mL — ABNORMAL HIGH (ref 232–1245)

## 2018-04-18 LAB — VITAMIN D 25 HYDROXY (VIT D DEFICIENCY, FRACTURES): Vit D, 25-Hydroxy: 46 ng/mL (ref 30.0–100.0)

## 2018-04-18 LAB — MICROALBUMIN / CREATININE URINE RATIO
Creatinine, Urine: 64.5 mg/dL
Microalb/Creat Ratio: 348.8 mg/g creat — ABNORMAL HIGH (ref 0.0–30.0)
Microalbumin, Urine: 225 ug/mL

## 2018-04-18 LAB — T4, FREE: Free T4: 2.51 ng/dL — ABNORMAL HIGH (ref 0.82–1.77)

## 2018-04-18 LAB — INSULIN, RANDOM: INSULIN: 16.8 u[IU]/mL (ref 2.6–24.9)

## 2018-04-24 ENCOUNTER — Other Ambulatory Visit (HOSPITAL_COMMUNITY): Payer: Self-pay

## 2018-04-25 ENCOUNTER — Ambulatory Visit (HOSPITAL_COMMUNITY): Payer: BC Managed Care – PPO | Attending: Cardiology

## 2018-04-25 ENCOUNTER — Other Ambulatory Visit: Payer: Self-pay

## 2018-04-25 DIAGNOSIS — R0602 Shortness of breath: Secondary | ICD-10-CM | POA: Diagnosis not present

## 2018-04-30 ENCOUNTER — Ambulatory Visit (INDEPENDENT_AMBULATORY_CARE_PROVIDER_SITE_OTHER): Payer: BC Managed Care – PPO | Admitting: Family Medicine

## 2018-04-30 VITALS — BP 166/76 | HR 102 | Temp 98.2°F | Ht 65.0 in | Wt 217.0 lb

## 2018-04-30 DIAGNOSIS — E1129 Type 2 diabetes mellitus with other diabetic kidney complication: Secondary | ICD-10-CM

## 2018-04-30 DIAGNOSIS — I519 Heart disease, unspecified: Secondary | ICD-10-CM | POA: Diagnosis not present

## 2018-04-30 DIAGNOSIS — Z9189 Other specified personal risk factors, not elsewhere classified: Secondary | ICD-10-CM

## 2018-04-30 DIAGNOSIS — R809 Proteinuria, unspecified: Secondary | ICD-10-CM

## 2018-04-30 DIAGNOSIS — Z794 Long term (current) use of insulin: Secondary | ICD-10-CM

## 2018-04-30 DIAGNOSIS — E038 Other specified hypothyroidism: Secondary | ICD-10-CM

## 2018-04-30 DIAGNOSIS — Z0289 Encounter for other administrative examinations: Secondary | ICD-10-CM

## 2018-04-30 DIAGNOSIS — I10 Essential (primary) hypertension: Secondary | ICD-10-CM

## 2018-04-30 DIAGNOSIS — Z6836 Body mass index (BMI) 36.0-36.9, adult: Secondary | ICD-10-CM

## 2018-04-30 DIAGNOSIS — I5189 Other ill-defined heart diseases: Secondary | ICD-10-CM

## 2018-05-05 ENCOUNTER — Encounter: Payer: Self-pay | Admitting: Internal Medicine

## 2018-05-05 DIAGNOSIS — I129 Hypertensive chronic kidney disease with stage 1 through stage 4 chronic kidney disease, or unspecified chronic kidney disease: Secondary | ICD-10-CM | POA: Insufficient documentation

## 2018-05-05 DIAGNOSIS — N289 Disorder of kidney and ureter, unspecified: Secondary | ICD-10-CM | POA: Insufficient documentation

## 2018-05-05 MED ORDER — LEVOTHYROXINE SODIUM 100 MCG PO TABS: 100.0000 ug | ORAL_TABLET | Freq: Every day | ORAL | 0 refills | Status: DC

## 2018-05-05 NOTE — Progress Notes (Signed)
Office: [phone]  /  Fax: [phone]   HPI:   Chief Complaint: OBESITY Brittany Rubio is here to discuss her progress with her obesity treatment plan. She is on the Category 2 plan and is following her eating plan approximately 65 % of the time. She states she is exercising 0 minutes 0 times per week. Brittany Rubio did well with weight loss and has decreased sugar in her diet, but still using creamer in her coffee. She limited eating out more than usual.  Her weight is 217 lb (98.4 kg) today and has had a weight loss of 4 pounds over a period of 2 weeks since her last visit. She has lost 4 lbs since starting treatment with [place].  Diabetes II Brittany Rubio has a diagnosis of diabetes type II. Brittany Rubio states she is not checking her BGs yet, she just got her meter and she is struggling to use it. Hgb A1c is elevated at 8.4 and she now has microalbuminuria. She denies any hypoglycemic episodes. She has been working on intensive lifestyle modifications including diet, exercise, and weight loss to help control her blood glucose levels.  Hypothyroidism Brittany Rubio has a diagnosis of hypothyroidism. She is on levothyroxine 112 mcg 2 tablets PO qd. TSH level is slightly low and T4 is slightly elevated. She is at risk for continuted over-replacement as she continues with weight loss. She denies hot or cold intolerance or palpitations.  Hypertension Brittany Rubio is a 57 y.o. female with hypertension. Brittany Rubio's blood pressure is elevated today, normally better controlled on medications. She denies chest pain or headaches. She is working weight loss to help control her blood pressure with the goal of decreasing her risk of heart attack and stroke. Brittany Rubio's blood pressure is not currently controlled.  Diastolic Dysfunction Grade I Brittany Rubio has a new diagnosis of diastolic dysfunction, grade I based on her recent echocardiogram showing EF 60-65 %. Her blood pressure is elevated today and she notes she is stressed  at work.   ALLERGIES: Allergies  Allergen Reactions  . Shellfish Allergy Hives and Swelling    "Only when I eat too many crab legs" Can tolerate CT scans with contrast-does not need premeds      MEDICATIONS: Current Outpatient Medications on File Prior to Visit  Medication Sig Dispense Refill  . amphetamine-dextroamphetamine (ADDERALL) 20 MG tablet Take 20 mg by mouth daily.     Marland Kitchen aspirin EC 81 MG tablet Take 81 mg by mouth daily.    . cholecalciferol (VITAMIN D) 1000 units tablet Take 1,000 Units by mouth daily.    . diazepam (VALIUM) 5 MG tablet Take 2.5-5 mg by mouth daily as needed for anxiety.  1  . diltiazem (MATZIM LA) 240 MG 24 hr tablet Take 240 mg by mouth at bedtime.     Marland Kitchen EPINEPHrine (EPIPEN 2-PAK) 0.3 mg/0.3 mL IJ SOAJ injection 0.3 mLs by Subdermal route once as needed. Allergic reaction    . esomeprazole (NEXIUM) 20 MG capsule Take 20 mg by mouth daily at 12 noon.    Marland Kitchen FARXIGA 10 MG TABS tablet TAKE 1 TABLET BY MOUTH EVERY DAY 30 tablet 5  . JANUMET 50-1000 MG tablet Take 1 tablet by mouth 2 (two) times daily with a meal.  1  . levothyroxine (SYNTHROID, LEVOTHROID) 112 MCG tablet Take 224 mcg by mouth every morning.     Marland Kitchen lisinopril-hydrochlorothiazide (PRINZIDE,ZESTORETIC) 20-25 MG tablet Take 1 tablet by mouth daily.     . Magnesium 500 MG CAPS Take 1 capsule by  mouth daily.    . promethazine (PHENERGAN) 25 MG tablet TAKE 1 TABLET EVERY 4 TO 6 HOURS AS NEEDED  0  . ranitidine (ZANTAC) 150 MG tablet Take 150 mg by mouth 2 (two) times daily.    . traZODone (DESYREL) 50 MG tablet Take 100 mg by mouth at bedtime.   1  . Turmeric Curcumin 500 MG CAPS Take 1 capsule by mouth daily.    Marland Kitchen VIIBRYD 40 MG TABS Take 40 mg by mouth at bedtime.  3  . vitamin C (ASCORBIC ACID) 500 MG tablet Take 500 mg by mouth daily.     No current facility-administered medications on file prior to visit.     PAST MEDICAL HISTORY: Past Medical History:  Diagnosis Date  . ADD (attention  deficit disorder)   . Anemia   . Anxiety   . Arthritis    "left shoulder" (07/07/2014)  . Back pain   . Breast cancer ([place])    "left"  . Depression   . Fatty liver   . Food allergy    shellfish  . GERD (gastroesophageal reflux disease)    "takes over the counters as needed"  . Hernia, umbilical   . High cholesterol   . Hypertension 11/26/2011   sees Dr. Bryon Lions  . Joint pain   . Migraines    "maybe once q 3 months" (07/07/2014)  . Multinodular thyroid 2015  . OSA on CPAP   . Personal history of radiation therapy   . Type II diabetes mellitus ([place])   . Vitamin D deficiency     PAST SURGICAL HISTORY: Past Surgical History:  Procedure Laterality Date  . ABDOMINAL HYSTERECTOMY  ? 1997  . BREAST EXCISIONAL BIOPSY Right 2014  . BREAST LUMPECTOMY Left 2009  . BREAST LUMPECTOMY Right 07/2012   "not a mastectomy"  . [place]   "trauma"  . PARTIAL MASTECTOMY WITH NEEDLE LOCALIZATION  08/12/2012   Procedure: PARTIAL MASTECTOMY WITH NEEDLE LOCALIZATION;  Surgeon: Marcello Moores A. Cornett, MD;  Location: [facility];  Service: General;  Laterality: Right;  . REDUCTION MAMMAPLASTY Bilateral 1995  . THYROIDECTOMY Left 07/07/2014   Procedure: LEFT HEMI-THYROIDECTOMY;  Surgeon: Ascencion Dike, MD;  Location: [facility];  Service: ENT;  Laterality: Left;  . THYROIDECTOMY Right 07/08/2014   Procedure: Completion THYROIDECTOMY;  Surgeon: Ascencion Dike, MD;  Location: [facility];  Service: ENT;  Laterality: Right;  . THYROIDECTOMY, PARTIAL Left 07/07/2014   hemi    SOCIAL HISTORY: Social History   Tobacco Use  . Smoking status: Never Smoker  . Smokeless tobacco: Never Used  Substance Use Topics  . Alcohol use: Yes    Comment: 07/07/2014 "a few times/year; weddings, anniversary, etc"  . Drug use: No    FAMILY HISTORY: Family History  Problem Relation Age of Onset  . Cancer Mother        Pancreatic  . Diabetes Mother   . Hypertension Mother   . Depression Mother   . Hypertension  Father   . Alcoholism Father     ROS: Review of Systems  Constitutional: Positive for weight loss.  Cardiovascular: Negative for chest pain and palpitations.  Neurological: Negative for headaches.  Endo/Heme/Allergies:       Negative hypoglycemia Negative hot/cold intolerance    PHYSICAL EXAM: Blood pressure (!) 166/76, pulse (!) 102, temperature 98.2 F (36.8 C), temperature source Oral, height 5\' 5"  (1.651 m), weight 217 lb (98.4 kg), SpO2 96 %. Body mass index is 36.11 kg/m.  Physical Exam  Constitutional: She is oriented to person, place, and time. She appears well-developed and well-nourished.  Cardiovascular: Normal rate.  Pulmonary/Chest: Effort normal.  Musculoskeletal: Normal range of motion.  Neurological: She is oriented to person, place, and time.  Skin: Skin is warm and dry.  Psychiatric: She has a normal mood and affect. Her behavior is normal.  Vitals reviewed.   RECENT LABS AND TESTS: BMET    Component Value Date/Time   NA 141 04/17/2018 1038   NA 143 05/28/2013 0844   K 4.5 04/17/2018 1038   K 4.4 05/28/2013 0844   CL 97 04/17/2018 1038   CL 99 11/20/2012 0940   CO2 25 04/17/2018 1038   CO2 28 05/28/2013 0844   GLUCOSE 133 (H) 04/17/2018 1038   GLUCOSE 168 (H) 11/30/2016 0514   GLUCOSE 94 05/28/2013 0844   GLUCOSE 142 (H) 11/20/2012 0940   BUN 16 04/17/2018 1038   BUN 16.9 05/28/2013 0844   CREATININE 0.99 04/17/2018 1038   CREATININE 0.8 05/28/2013 0844   CALCIUM 9.8 04/17/2018 1038   CALCIUM 9.8 05/28/2013 0844   GFRNONAA 63 04/17/2018 1038   GFRAA 73 04/17/2018 1038   Lab Results  Component Value Date   HGBA1C 8.4 (H) 04/17/2018   HGBA1C 9.2 (A) 01/08/2018   Lab Results  Component Value Date   INSULIN 16.8 04/17/2018   CBC    Component Value Date/Time   WBC 7.9 04/17/2018 1038   WBC 16.1 (H) 12/02/2016 0742   RBC 5.29 (H) 04/17/2018 1038   RBC 4.67 12/02/2016 0742   HGB 12.2 04/17/2018 1038   HGB 12.9 05/28/2013 0844   HCT  38.5 04/17/2018 1038   HCT 40.8 05/28/2013 0844   PLT 293 12/02/2016 0742   PLT 317 05/28/2013 0844   MCV 73 (L) 04/17/2018 1038   MCV 72.1 (L) 05/28/2013 0844   MCH 23.1 (L) 04/17/2018 1038   MCH 22.9 (L) 12/02/2016 0742   MCHC 31.7 04/17/2018 1038   MCHC 32.4 12/02/2016 0742   RDW 16.0 (H) 04/17/2018 1038   RDW 17.6 (H) 05/28/2013 0844   LYMPHSABS 1.4 04/17/2018 1038   LYMPHSABS 1.7 05/28/2013 0844   MONOABS 0.2 11/26/2016 1836   MONOABS 0.8 05/28/2013 0844   EOSABS 0.2 04/17/2018 1038   BASOSABS 0.1 04/17/2018 1038   BASOSABS 0.1 05/28/2013 0844   Iron/TIBC/Ferritin/ %Sat    Component Value Date/Time   FERRITIN 25 07/05/2011 0844   Lipid Panel     Component Value Date/Time   CHOL 202 (H) 04/17/2018 1038   TRIG 122 04/17/2018 1038   HDL 50 04/17/2018 1038   LDLCALC 128 (H) 04/17/2018 1038   Hepatic Function Panel     Component Value Date/Time   PROT 7.4 04/17/2018 1038   PROT 7.9 05/28/2013 0844   ALBUMIN 4.3 04/17/2018 1038   ALBUMIN 3.7 05/28/2013 0844   AST 25 04/17/2018 1038   AST 15 05/28/2013 0844   ALT 37 (H) 04/17/2018 1038   ALT 23 05/28/2013 0844   ALKPHOS 56 04/17/2018 1038   ALKPHOS 58 05/28/2013 0844   BILITOT <0.2 04/17/2018 1038   BILITOT 0.28 05/28/2013 0844      Component Value Date/Time   TSH 0.449 (L) 04/17/2018 1038    ASSESSMENT AND PLAN: Type 2 diabetes mellitus with microalbuminuria, with long-term current use of insulin (HCC)  Other specified hypothyroidism  Essential hypertension  Grade I diastolic dysfunction  At risk for heart disease  Class 2 severe obesity with serious comorbidity and  body mass index (BMI) of 36.0 to 36.9 in adult, unspecified obesity type ([place])  PLAN:  Diabetes II Brittany Rubio has been given extensive diabetes education by myself today including ideal fasting and post-prandial blood glucose readings, individual ideal Hgb A1c goals and hypoglycemia prevention. We discussed the importance of good blood  sugar control to decrease the likelihood of diabetic complications such as nephropathy, neuropathy, limb loss, blindness, coronary artery disease, and death. We discussed the importance of intensive lifestyle modification including diet, exercise and weight loss as the first line treatment for diabetes. Michaeline agrees to continue her diabetes medications as is and check BGs BID. Brittany Rubio agrees to follow up with our clinic in 2 weeks.  Hypothyroidism Brittany Rubio was informed of the importance of good thyroid control to help with weight loss efforts. She was also informed that supertheraputic thyroid levels are dangerous and will not improve weight loss results. Brittany Rubio agrees to change levothyroxine to 112 mcg qd and she agrees to start levothyroxine 100 mcg qd #30 with no refills. We will recheck labs in 6 weeks. Brittany Rubio agrees to follow up with our clinic in 2 weeks.  Hypertension We discussed sodium restriction, working on healthy weight loss, and a regular exercise program as the means to achieve improved blood pressure control. Brittany Rubio agreed with this plan and agreed to follow up as directed. We will continue to monitor her blood pressure as well as her progress with the above lifestyle modifications. Brittany Rubio agrees to continue diet and her medications and will watch for signs of hypotension as she continues her lifestyle modifications. Brittany Rubio agrees to follow up with our clinic in 2 weeks and we will recheck blood pressure at that time.  Diastolic Dysfunction Grade I Brittany Rubio was educated on the importance of tight blood pressure control and weight loss. We will continue to monitor and Brittany Rubio agrees to follow up with our clinic in 2 weeks.  Cardiovascular risk counselling Brittany Rubio was given extended (15 minutes) coronary artery disease prevention counseling today. She is 57 y.o. female and has risk factors for heart disease including obesity. We discussed intensive lifestyle modifications  today with an emphasis on specific weight loss instructions and strategies. Pt was also informed of the importance of increasing exercise and decreasing saturated fats to help prevent heart disease.  Obesity Brittany Rubio is currently in the action stage of change. As such, her goal is to continue with weight loss efforts She has agreed to follow the Category 2 plan + 100 calories Brittany Rubio has been instructed to work up to a goal of 150 minutes of combined cardio and strengthening exercise per week for weight loss and overall health benefits. We discussed the following Behavioral Modification Strategies today: increasing lean protein intake, decreasing simple carbohydrates, decrease eating out, work on meal planning and easy cooking plans and decrease liquid calories   Brittany Rubio has agreed to follow up with our clinic in 2 weeks. She was informed of the importance of frequent follow up visits to maximize her success with intensive lifestyle modifications for her multiple health conditions.   OBESITY BEHAVIORAL INTERVENTION VISIT  Today's visit was # 2   Starting weight: 221 lbs Starting date: 04/17/18 Today's weight : 217 lbs  Today's date: 04/30/2018 Total lbs lost to date: 4    ASK: We discussed the diagnosis of obesity with Brittany Rubio today and [place] agreed to give [place] permission to discuss obesity behavioral modification therapy today.  ASSESS: Kalliope has the diagnosis of obesity and her BMI today is  36.11 Wonda is in the action stage of change   ADVISE: Janiaya was educated on the multiple health risks of obesity as well as the benefit of weight loss to improve her health. She was advised of the need for long term treatment and the importance of lifestyle modifications to improve her current health and to decrease her risk of future health problems.  AGREE: Multiple dietary modification options and treatment options were discussed and  Robinn agreed to follow the  recommendations documented in the above note.  ARRANGE: Allura was educated on the importance of frequent visits to treat obesity as outlined per CMS and USPSTF guidelines and agreed to schedule her next follow up appointment today.  I, Trixie Dredge, am acting as transcriptionist for Dennard Nip, MD  I have reviewed the above documentation for accuracy and completeness, and I agree with the above. -Dennard Nip, MD

## 2018-05-07 ENCOUNTER — Encounter: Payer: Self-pay | Admitting: Internal Medicine

## 2018-05-07 ENCOUNTER — Ambulatory Visit: Payer: BC Managed Care – PPO | Admitting: Internal Medicine

## 2018-05-07 VITALS — BP 122/74 | HR 68 | Temp 98.6°F | Ht 64.25 in | Wt 222.4 lb

## 2018-05-07 DIAGNOSIS — N182 Chronic kidney disease, stage 2 (mild): Secondary | ICD-10-CM

## 2018-05-07 DIAGNOSIS — Z23 Encounter for immunization: Secondary | ICD-10-CM

## 2018-05-07 DIAGNOSIS — I129 Hypertensive chronic kidney disease with stage 1 through stage 4 chronic kidney disease, or unspecified chronic kidney disease: Secondary | ICD-10-CM | POA: Diagnosis not present

## 2018-05-07 DIAGNOSIS — E1122 Type 2 diabetes mellitus with diabetic chronic kidney disease: Secondary | ICD-10-CM | POA: Diagnosis not present

## 2018-05-07 DIAGNOSIS — IMO0002 Reserved for concepts with insufficient information to code with codable children: Secondary | ICD-10-CM

## 2018-05-07 DIAGNOSIS — Z0000 Encounter for general adult medical examination without abnormal findings: Secondary | ICD-10-CM | POA: Diagnosis not present

## 2018-05-07 DIAGNOSIS — Z853 Personal history of malignant neoplasm of breast: Secondary | ICD-10-CM

## 2018-05-07 DIAGNOSIS — Z7982 Long term (current) use of aspirin: Secondary | ICD-10-CM

## 2018-05-07 DIAGNOSIS — E1165 Type 2 diabetes mellitus with hyperglycemia: Secondary | ICD-10-CM

## 2018-05-07 LAB — POCT URINALYSIS DIPSTICK
Bilirubin, UA: NEGATIVE
Blood, UA: NEGATIVE
Glucose, UA: POSITIVE — AB
Ketones, UA: NEGATIVE
Leukocytes, UA: NEGATIVE
Nitrite, UA: NEGATIVE
Protein, UA: POSITIVE — AB
Spec Grav, UA: 1.015 (ref 1.010–1.025)
Urobilinogen, UA: 0.2 E.U./dL
pH, UA: 6.5 (ref 5.0–8.0)

## 2018-05-07 LAB — POCT UA - MICROALBUMIN
Creatinine, POC: 100 mg/dL
Microalbumin Ur, POC: 150 mg/L

## 2018-05-07 NOTE — Progress Notes (Addendum)
Subjective:     Patient ID: Brittany Rubio , female    DOB: 1960/10/09 , 57 y.o.   MRN: [id number]   She is here today for a full physical exam. She is followed by Dr. Raphael Gibney for her pap smears.   Diabetes  She presents for her follow-up diabetic visit. She has type 2 diabetes mellitus. There are no hypoglycemic associated symptoms. Pertinent negatives for hypoglycemia include no dizziness or headaches. Pertinent negatives for diabetes include no chest pain, no fatigue, no polydipsia and no polyphagia. There are no hypoglycemic complications. Symptoms are stable. Her breakfast blood glucose is taken between 8-9 am. Her breakfast blood glucose range is generally 130-140 mg/dl.  Hypertension  This is a chronic problem. The current episode started more than 1 year ago. The problem is controlled. Pertinent negatives include no chest pain or headaches. Risk factors for coronary artery disease include diabetes mellitus, obesity, sedentary lifestyle, dyslipidemia, post-menopausal state and stress.     Past Medical History:  Diagnosis Date  . ADD (attention deficit disorder)   . Anemia   . Anxiety   . Arthritis    "left shoulder" (07/07/2014)  . Back pain   . Breast cancer ([place])    "left"  . Depression   . Fatty liver   . Food allergy    shellfish  . GERD (gastroesophageal reflux disease)    "takes over the counters as needed"  . Hernia, umbilical   . High cholesterol   . Hypertension 11/26/2011   sees Dr. Bryon Lions  . Joint pain   . Migraines    "maybe once q 3 months" (07/07/2014)  . Multinodular thyroid 2015  . OSA on CPAP   . Personal history of radiation therapy   . Type II diabetes mellitus ([place])   . Vitamin D deficiency       Current Outpatient Medications:  .  amphetamine-dextroamphetamine (ADDERALL) 20 MG tablet, Take 20 mg by mouth daily. , Disp: , Rfl:  .  amphetamine-dextroamphetamine (ADDERALL) 30 MG tablet, Take 30 mg by mouth daily., Disp: , Rfl:  .   aspirin EC 81 MG tablet, Take 81 mg by mouth daily., Disp: , Rfl:  .  cholecalciferol (VITAMIN D) 1000 units tablet, Take 1,000 Units by mouth daily., Disp: , Rfl:  .  Cyanocobalamin (VITAMIN B-12 PO), Take by mouth., Disp: , Rfl:  .  diazepam (VALIUM) 5 MG tablet, Take 2.5-5 mg by mouth daily as needed for anxiety., Disp: , Rfl: 1 .  diltiazem (MATZIM LA) 240 MG 24 hr tablet, Take 240 mg by mouth at bedtime. , Disp: , Rfl:  .  EPINEPHrine (EPIPEN 2-PAK) 0.3 mg/0.3 mL IJ SOAJ injection, 0.3 mLs by Subdermal route once as needed. Allergic reaction, Disp: , Rfl:  .  esomeprazole (NEXIUM) 20 MG capsule, Take 20 mg by mouth daily at 12 noon., Disp: , Rfl:  .  FARXIGA 10 MG TABS tablet, TAKE 1 TABLET BY MOUTH EVERY DAY, Disp: 30 tablet, Rfl: 5 .  JANUMET 50-1000 MG tablet, Take 1 tablet by mouth 2 (two) times daily with a meal., Disp: , Rfl: 1 .  levothyroxine (SYNTHROID, LEVOTHROID) 112 MCG tablet, Take 224 mcg by mouth every morning. , Disp: , Rfl:  .  lisinopril-hydrochlorothiazide (PRINZIDE,ZESTORETIC) 20-25 MG tablet, Take 1 tablet by mouth daily. , Disp: , Rfl:  .  Magnesium 500 MG CAPS, Take 1 capsule by mouth daily., Disp: , Rfl:  .  promethazine (PHENERGAN) 25 MG tablet, TAKE 1 TABLET  EVERY 4 TO 6 HOURS AS NEEDED, Disp: , Rfl: 0 .  ranitidine (ZANTAC) 150 MG tablet, Take 150 mg by mouth 2 (two) times daily., Disp: , Rfl:  .  traZODone (DESYREL) 50 MG tablet, Take 100 mg by mouth at bedtime. , Disp: , Rfl: 1 .  Turmeric Curcumin 500 MG CAPS, Take 1 capsule by mouth daily., Disp: , Rfl:  .  VIIBRYD 40 MG TABS, Take 40 mg by mouth at bedtime., Disp: , Rfl: 3 .  vitamin C (ASCORBIC ACID) 500 MG tablet, Take 500 mg by mouth daily., Disp: , Rfl:    Allergies  Allergen Reactions  . Shellfish Allergy Hives and Swelling    "Only when I eat too many crab legs" Can tolerate CT scans with contrast-does not need premeds       Review of Systems  Constitutional: Negative.  Negative for fatigue.   HENT: Negative.   Eyes: Negative.   Respiratory: Negative.   Cardiovascular: Negative.  Negative for chest pain.  Gastrointestinal: Negative.   Endocrine: Negative.  Negative for polydipsia and polyphagia.  Genitourinary: Negative.   Musculoskeletal: Negative.   Skin: Negative.   Neurological: Negative.  Negative for dizziness and headaches.  Hematological: Negative.   Psychiatric/Behavioral: Negative.      Today's Vitals   05/07/18 0912  BP: 122/74  Pulse: 68  Temp: 98.6 F (37 C)  TempSrc: Oral  Weight: 222 lb 6.4 oz (100.9 kg)  Height: 5' 4.25" (1.632 m)   Body mass index is 37.88 kg/m.   Objective:  Physical Exam  Constitutional: She is oriented to person, place, and time. She appears well-developed and well-nourished.  HENT:  Head: Normocephalic and atraumatic.  Right Ear: External ear normal.  Left Ear: External ear normal.  Nose: Nose normal.  Mouth/Throat: Oropharynx is clear and moist.  Eyes: Pupils are equal, round, and reactive to light. Conjunctivae and EOM are normal.  Neck: Normal range of motion. Neck supple.  Cardiovascular: Normal rate, regular rhythm, normal heart sounds and intact distal pulses.  Pulses:      Dorsalis pedis pulses are 2+ on the right side, and 2+ on the left side.  Pulmonary/Chest: Effort normal and breath sounds normal. Right breast exhibits no inverted nipple, no mass, no nipple discharge, no skin change and no tenderness. Left breast exhibits skin change (scar tissue appreciated on the left, near surgical scar). Left breast exhibits no inverted nipple, no mass and no nipple discharge.  Mass left upper/outer quadrant, Healed surgical scars  Abdominal: Soft. Bowel sounds are normal.  Genitourinary:  Genitourinary Comments: deferred  Musculoskeletal: Normal range of motion.  Feet:  Right Foot:  Protective Sensation: 5 sites tested. 5 sites sensed.  Skin Integrity: Negative for blister, skin breakdown or erythema.  Left Foot:   Protective Sensation: 5 sites tested. 5 sites sensed.  Skin Integrity: Negative for blister, skin breakdown or erythema.  Neurological: She is alert and oriented to person, place, and time.  Skin: Skin is warm and dry.  Psychiatric: She has a normal mood and affect.  Nursing note and vitals reviewed.       Assessment And Plan:     1. Routine general medical examination at health care facility  A full exam was performed. Importance of monthly self breast exams was discussed with the patient.   PATIENT HAS BEEN ADVISED TO GET 30-45 MINUTES REGULAR EXERCISE NO LESS THAN FOUR TO FIVE DAYS PER WEEK - BOTH WEIGHTBEARING EXERCISES AND AEROBIC ARE RECOMMENDED. SHE IS  ADVISED TO FOLLOW A HEALTHY DIET WITH AT LEAST SIX FRUITS/VEGGIES PER DAY, DECREASE INTAKE OF RED MEAT, AND TO INCREASE FISH INTAKE TO TWO DAYS PER WEEK.  MEATS/FISH SHOULD NOT BE FRIED, BAKED OR BROILED IS PREFERABLE.  I SUGGEST WEARING SPF 50 SUNSCREEN ON EXPOSED PARTS AND ESPECIALLY WHEN IN THE DIRECT SUNLIGHT FOR AN EXTENDED PERIOD OF TIME.  PLEASE AVOID FAST FOOD RESTAURANTS AND INCREASE YOUR WATER INTAKE.   2. Uncontrolled diabetes mellitus with stage 2 chronic kidney disease ([place])  Diabetic foot exam was performed.   I DISCUSSED WITH THE PATIENT AT LENGTH REGARDING THE GOALS OF GLYCEMIC CONTROL AND POSSIBLE LONG-TERM COMPLICATIONS.  I  ALSO STRESSED THE IMPORTANCE OF COMPLIANCE WITH HOME GLUCOSE MONITORING, DIETARY RESTRICTIONS INCLUDING AVOIDANCE OF SUGARY DRINKS/PROCESSED FOODS,  ALONG WITH REGULAR EXERCISE.  I  ALSO STRESSED THE IMPORTANCE OF ANNUAL EYE EXAMS, SELF FOOT CARE AND COMPLIANCE WITH OFFICE VISITS.  3. Chronic renal disease, stage II  Chronic. She is encouraged to stay well hydrated. Importance of maintaining optimal BP/BS control was also discussed with the patient.   4. Hypertensive nephropathy  Well controlled. She will continue with current meds. She is encouraged to limit her salt intake.   5. Needs flu  shot  She was given flu vaccine.   [name], MD

## 2018-05-11 ENCOUNTER — Encounter: Payer: Self-pay | Admitting: Internal Medicine

## 2018-05-12 DIAGNOSIS — Z23 Encounter for immunization: Secondary | ICD-10-CM | POA: Diagnosis not present

## 2018-05-19 ENCOUNTER — Ambulatory Visit (INDEPENDENT_AMBULATORY_CARE_PROVIDER_SITE_OTHER): Payer: BC Managed Care – PPO | Admitting: Family Medicine

## 2018-05-27 ENCOUNTER — Other Ambulatory Visit: Payer: Self-pay | Admitting: Internal Medicine

## 2018-05-28 ENCOUNTER — Ambulatory Visit (INDEPENDENT_AMBULATORY_CARE_PROVIDER_SITE_OTHER): Payer: BC Managed Care – PPO | Admitting: Family Medicine

## 2018-05-28 ENCOUNTER — Encounter (INDEPENDENT_AMBULATORY_CARE_PROVIDER_SITE_OTHER): Payer: Self-pay

## 2018-05-28 NOTE — Pre-Procedure Instructions (Signed)
Brittany Rubio  05/28/2018      [id number] - [place], [place] [address] [place] [address] Phone: [phone] Fax: [phone]    Your procedure is scheduled on Thurs., Nov. 21, 2019 from 8:30AM-11:30AM  Report to [facility] Admitting Entrance "A" at 6:30AM  Call this number if you have problems the morning of surgery:  [phone]   Remember:  Do not eat after midnight on Nov. 20th  You may drink clear liquids until 3 hours (5:30AM) prior to surgery.  Clear liquids allowed are:   Water, Clear Tea, Black Coffee only and Gatorade   Please complete your PRE-SURGERY ENSURE that was given to by (5:30AM) the morning of surgery.  Please, if able, drink it in one setting. DO NOT SIP.    Take these medicines the morning of surgery with A SIP OF WATER: Levothyroxine (SYNTHROID, LEVOTHROID) and Ranitidine (ZANTAC)   If needed: Diazepam (VALIUM), Esomeprazole (NEXIUM), and Fexofenadine (ALLEGRA)  Follow your surgeon's instructions on when to stop Aspirin.  If no instructions were given by your surgeon then you will need to call the office to get those instructions.    As of today, stop taking all Other Aspirin Products, Vitamins, Fish oils, and Herbal medications. Also stop all NSAIDS i.e. Advil, Ibuprofen, Motrin, Aleve, Anaprox, Naproxen, BC, Goody Powders, and all Supplements.  How to Manage Your Diabetes Before and After Surgery  Why is it important to control my blood sugar before and after surgery? . Improving blood sugar levels before and after surgery helps healing and can limit problems. . A way of improving blood sugar control is eating a healthy diet by: o  Eating less sugar and carbohydrates o  Increasing activity/exercise o  Talking with your doctor about reaching your blood sugar goals . High blood sugars (greater than 180 mg/dL) can raise your risk of infections and slow  your recovery, so you will need to focus on controlling your diabetes during the weeks before surgery. . Make sure that the doctor who takes care of your diabetes knows about your planned surgery including the date and location.  How do I manage my blood sugar before surgery? . Check your blood sugar at least 4 times a day, starting 2 days before surgery, to make sure that the level is not too high or low. o Check your blood sugar the morning of your surgery when you wake up and every 2 hours until you get to the Short Stay unit. . If your blood sugar is less than 70 mg/dL, you will need to treat for low blood sugar: o Do not take insulin. o Treat a low blood sugar (less than 70 mg/dL) with  cup of clear juice (cranberry or apple), 4 glucose tablets, OR glucose gel. Recheck blood sugar in 15 minutes after treatment (to make sure it is greater than 70 mg/dL). If your blood sugar is not greater than 70 mg/dL on recheck, call [phone] o  for further instructions. . If your CBG is greater than 220 mg/dL, inform the staff upon arrival to Short Stay  . If you are admitted to the hospital after surgery: o Your blood sugar will be checked by the staff and you will probably be given insulin after surgery (instead of oral diabetes medicines) to make sure you have good blood sugar levels. o The goal for blood sugar control after surgery is 80-180 mg/dL.  WHAT DO I DO ABOUT MY DIABETES MEDICATION?  Marland Kitchen Do not take FARXIGA and JANUMET the morning of surgery.  Reviewed and Endorsed by [facility] Patient Education Committee, August 2015   Do not wear jewelry, make-up or nail polish.  Do not wear lotions, powders, or perfumes, or deodorant.  Do not shave 48 hours prior to surgery.    Do not bring valuables to the hospital.  [facility] is not responsible for any belongings or valuables.  Contacts, dentures or bridgework may not be worn into surgery.  Leave your suitcase in the car.  After surgery it  may be brought to your room.  For patients admitted to the hospital, discharge time will be determined by your treatment team.  Patients discharged the day of surgery will not be allowed to drive home.   Special instructions:   [place]- Preparing For Surgery  Before surgery, you can play an important role. Because skin is not sterile, your skin needs to be as free of germs as possible. You can reduce the number of germs on your skin by washing with CHG (chlorahexidine gluconate) Soap before surgery.  CHG is an antiseptic cleaner which kills germs and bonds with the skin to continue killing germs even after washing.    Oral Hygiene is also important to reduce your risk of infection.  Remember - BRUSH YOUR TEETH THE MORNING OF SURGERY WITH YOUR REGULAR TOOTHPASTE  Please do not use if you have an allergy to CHG or antibacterial soaps. If your skin becomes reddened/irritated stop using the CHG.  Do not shave (including legs and underarms) for at least 48 hours prior to first CHG shower. It is OK to shave your face.  Please follow these instructions carefully.   1. Shower the NIGHT BEFORE SURGERY and the MORNING OF SURGERY with CHG.   2. If you chose to wash your hair, wash your hair first as usual with your normal shampoo.  3. After you shampoo, rinse your hair and body thoroughly to remove the shampoo.  4. Use CHG as you would any other liquid soap. You can apply CHG directly to the skin and wash gently with a scrungie or a clean washcloth.   5. Apply the CHG Soap to your body ONLY FROM THE NECK DOWN.  Do not use on open wounds or open sores. Avoid contact with your eyes, ears, mouth and genitals (private parts). Wash Face and genitals (private parts)  with your normal soap.  6. Wash thoroughly, paying special attention to the area where your surgery will be performed.  7. Thoroughly rinse your body with warm water from the neck down.  8. DO NOT shower/wash with your normal soap  after using and rinsing off the CHG Soap.  9. Pat yourself dry with a CLEAN TOWEL.  10. Wear CLEAN PAJAMAS to bed the night before surgery, wear comfortable clothes the morning of surgery  11. Place CLEAN SHEETS on your bed the night of your first shower and DO NOT SLEEP WITH PETS.  Day of Surgery:  Do not apply any deodorants/lotions.  Please wear clean clothes to the hospital/surgery center.   Remember to brush your teeth WITH YOUR REGULAR TOOTHPASTE.  Please read over the following fact sheets that you were given. Pain Booklet, Coughing and Deep Breathing and Surgical Site Infection Prevention

## 2018-05-29 ENCOUNTER — Other Ambulatory Visit: Payer: Self-pay

## 2018-05-29 ENCOUNTER — Encounter (HOSPITAL_COMMUNITY): Payer: Self-pay

## 2018-05-29 ENCOUNTER — Other Ambulatory Visit (INDEPENDENT_AMBULATORY_CARE_PROVIDER_SITE_OTHER): Payer: Self-pay | Admitting: Family Medicine

## 2018-05-29 ENCOUNTER — Encounter (HOSPITAL_COMMUNITY)
Admission: RE | Admit: 2018-05-29 | Discharge: 2018-05-29 | Disposition: A | Discharge status: Home / Self Care | Payer: BC Managed Care – PPO | Source: Ambulatory Visit | Attending: Surgery | Admitting: Surgery

## 2018-05-29 DIAGNOSIS — Z01812 Encounter for preprocedural laboratory examination: Secondary | ICD-10-CM | POA: Diagnosis present

## 2018-05-29 DIAGNOSIS — E038 Other specified hypothyroidism: Secondary | ICD-10-CM

## 2018-05-29 HISTORY — DX: Hypothyroidism, unspecified: E03.9

## 2018-05-29 HISTORY — DX: Pneumonia, unspecified organism: J18.9

## 2018-05-29 HISTORY — DX: Malignant neoplasm of thyroid gland: C73

## 2018-05-29 LAB — CBC WITH DIFFERENTIAL/PLATELET
Abs Immature Granulocytes: 0.05 10*3/uL (ref 0.00–0.07)
Basophils Absolute: 0.1 10*3/uL (ref 0.0–0.1)
Basophils Relative: 1 %
Eosinophils Absolute: 0.4 10*3/uL (ref 0.0–0.5)
Eosinophils Relative: 6 %
HCT: 42.5 % (ref 36.0–46.0)
Hemoglobin: 12.3 g/dL (ref 12.0–15.0)
Immature Granulocytes: 1 %
Lymphocytes Relative: 17 %
Lymphs Abs: 1.3 10*3/uL (ref 0.7–4.0)
MCH: 22 pg — ABNORMAL LOW (ref 26.0–34.0)
MCHC: 28.9 g/dL — ABNORMAL LOW (ref 30.0–36.0)
MCV: 75.9 fL — ABNORMAL LOW (ref 80.0–100.0)
Monocytes Absolute: 0.8 10*3/uL (ref 0.1–1.0)
Monocytes Relative: 11 %
Neutro Abs: 5 10*3/uL (ref 1.7–7.7)
Neutrophils Relative %: 64 %
Platelets: 281 10*3/uL (ref 150–400)
RBC: 5.6 MIL/uL — ABNORMAL HIGH (ref 3.87–5.11)
RDW: 18.3 % — ABNORMAL HIGH (ref 11.5–15.5)
WBC: 7.6 10*3/uL (ref 4.0–10.5)
nRBC: 0 % (ref 0.0–0.2)

## 2018-05-29 LAB — COMPREHENSIVE METABOLIC PANEL
ALT: 31 U/L (ref 0–44)
AST: 25 U/L (ref 15–41)
Albumin: 3.4 g/dL — ABNORMAL LOW (ref 3.5–5.0)
Alkaline Phosphatase: 46 U/L (ref 38–126)
Anion gap: 12 (ref 5–15)
BUN: 19 mg/dL (ref 6–20)
CO2: 25 mmol/L (ref 22–32)
Calcium: 9.1 mg/dL (ref 8.9–10.3)
Chloride: 103 mmol/L (ref 98–111)
Creatinine, Ser: 1.3 mg/dL — ABNORMAL HIGH (ref 0.44–1.00)
GFR calc Af Amer: 51 mL/min — ABNORMAL LOW (ref >60)
GFR calc non Af Amer: 44 mL/min — ABNORMAL LOW (ref >60)
Glucose, Bld: 226 mg/dL — ABNORMAL HIGH (ref 70–99)
Potassium: 4.4 mmol/L (ref 3.5–5.1)
Sodium: 140 mmol/L (ref 135–145)
Total Bilirubin: 0.3 mg/dL (ref 0.3–1.2)
Total Protein: 7.2 g/dL (ref 6.5–8.1)

## 2018-05-29 LAB — GLUCOSE, CAPILLARY: Glucose-Capillary: 217 mg/dL — ABNORMAL HIGH (ref 70–99)

## 2018-05-29 NOTE — Progress Notes (Signed)
PCP: Glendale Chard  Cardiologist: denies  DM: Type 2 Fasting sugars: low 100's Does not regularly check sugars.  SA: yes, wears CPAP  Instructed to stop taking ASA 81 mg 7 days prior to surgery.  Pt denies SOB, chest pain, fever. Does have a cough due to sinus drainage  Pt states understanding of instructions given for day of surgery.

## 2018-06-04 ENCOUNTER — Other Ambulatory Visit: Payer: Self-pay | Admitting: Internal Medicine

## 2018-06-04 MED ORDER — DIAZEPAM 5 MG PO TABS: 5.0000 mg | ORAL_TABLET | Freq: Every day | ORAL | 0 refills | Status: DC | PRN

## 2018-06-04 NOTE — Anesthesia Preprocedure Evaluation (Addendum)
Anesthesia Evaluation  Patient identified by MRN, date of birth, ID band Patient awake    Reviewed: Allergy & Precautions, NPO status , Patient's Chart, lab work & pertinent test results  Airway Mallampati: II  TM Distance: >3 FB Neck ROM: Full    Dental no notable dental hx.    Pulmonary sleep apnea and Continuous Positive Airway Pressure Ventilation ,    Pulmonary exam normal breath sounds clear to auscultation       Cardiovascular hypertension, Pt. on medications Normal cardiovascular exam Rhythm:Regular Rate:Normal  ECG: SR, rate 80  ECHO: Normal LV systolic function; mild diastolic dysfunction.   Neuro/Psych  Headaches, PSYCHIATRIC DISORDERS Anxiety Depression    GI/Hepatic Neg liver ROS, GERD  Medicated and Controlled,  Endo/Other  diabetesHypothyroidism   Renal/GU negative Renal ROS     Musculoskeletal negative musculoskeletal ROS (+)   Abdominal (+) + obese,   Peds  Hematology negative hematology ROS (+)   Anesthesia Other Findings incisional hernia and bilateral inguinal hernias  Reproductive/Obstetrics                            Anesthesia Physical Anesthesia Plan  ASA: III  Anesthesia Plan: Regional and General   Post-op Pain Management:    Induction:   PONV Risk Score and Plan: 4 or greater and Midazolam, Dexamethasone, Ondansetron, Treatment may vary due to age or medical condition and Scopolamine patch - Pre-op  Airway Management Planned: Oral ETT  Additional Equipment:   Intra-op Plan:   Post-operative Plan: Extubation in OR  Informed Consent: I have reviewed the patients History and Physical, chart, labs and discussed the procedure including the risks, benefits and alternatives for the proposed anesthesia with the patient or authorized representative who has indicated his/her understanding and acceptance.   Dental advisory given  Plan Discussed with:  CRNA  Anesthesia Plan Comments:        Anesthesia Quick Evaluation

## 2018-06-05 ENCOUNTER — Ambulatory Visit: Payer: BC Managed Care – PPO | Admitting: Adult Health

## 2018-06-05 ENCOUNTER — Observation Stay (HOSPITAL_COMMUNITY)
Admission: RE | Admit: 2018-06-05 | Discharge: 2018-06-06 | Disposition: A | Discharge status: Home / Self Care | Payer: BC Managed Care – PPO | Source: Ambulatory Visit | Attending: Surgery | Admitting: Surgery

## 2018-06-05 ENCOUNTER — Ambulatory Visit (HOSPITAL_COMMUNITY): Payer: BC Managed Care – PPO | Admitting: Physician Assistant

## 2018-06-05 ENCOUNTER — Encounter (HOSPITAL_COMMUNITY): Payer: Self-pay

## 2018-06-05 ENCOUNTER — Ambulatory Visit (HOSPITAL_COMMUNITY): Payer: BC Managed Care – PPO | Admitting: Anesthesiology

## 2018-06-05 ENCOUNTER — Encounter (HOSPITAL_COMMUNITY)
Admission: RE | Disposition: A | Discharge status: Home / Self Care | Payer: Self-pay | Source: Ambulatory Visit | Attending: Surgery

## 2018-06-05 DIAGNOSIS — Z7984 Long term (current) use of oral hypoglycemic drugs: Secondary | ICD-10-CM | POA: Insufficient documentation

## 2018-06-05 DIAGNOSIS — E119 Type 2 diabetes mellitus without complications: Secondary | ICD-10-CM | POA: Diagnosis not present

## 2018-06-05 DIAGNOSIS — F329 Major depressive disorder, single episode, unspecified: Secondary | ICD-10-CM | POA: Insufficient documentation

## 2018-06-05 DIAGNOSIS — Z9989 Dependence on other enabling machines and devices: Secondary | ICD-10-CM | POA: Diagnosis not present

## 2018-06-05 DIAGNOSIS — G4730 Sleep apnea, unspecified: Secondary | ICD-10-CM | POA: Diagnosis not present

## 2018-06-05 DIAGNOSIS — Z85850 Personal history of malignant neoplasm of thyroid: Secondary | ICD-10-CM | POA: Insufficient documentation

## 2018-06-05 DIAGNOSIS — F419 Anxiety disorder, unspecified: Secondary | ICD-10-CM | POA: Insufficient documentation

## 2018-06-05 DIAGNOSIS — K432 Incisional hernia without obstruction or gangrene: Secondary | ICD-10-CM

## 2018-06-05 DIAGNOSIS — K219 Gastro-esophageal reflux disease without esophagitis: Secondary | ICD-10-CM | POA: Diagnosis not present

## 2018-06-05 DIAGNOSIS — Z853 Personal history of malignant neoplasm of breast: Secondary | ICD-10-CM | POA: Diagnosis not present

## 2018-06-05 DIAGNOSIS — Z79890 Hormone replacement therapy (postmenopausal): Secondary | ICD-10-CM | POA: Insufficient documentation

## 2018-06-05 DIAGNOSIS — K660 Peritoneal adhesions (postprocedural) (postinfection): Secondary | ICD-10-CM | POA: Diagnosis not present

## 2018-06-05 DIAGNOSIS — Z79899 Other long term (current) drug therapy: Secondary | ICD-10-CM | POA: Insufficient documentation

## 2018-06-05 DIAGNOSIS — E039 Hypothyroidism, unspecified: Secondary | ICD-10-CM | POA: Insufficient documentation

## 2018-06-05 DIAGNOSIS — K439 Ventral hernia without obstruction or gangrene: Secondary | ICD-10-CM | POA: Diagnosis present

## 2018-06-05 DIAGNOSIS — I10 Essential (primary) hypertension: Secondary | ICD-10-CM | POA: Insufficient documentation

## 2018-06-05 HISTORY — PX: INCISIONAL HERNIA REPAIR: SHX193

## 2018-06-05 HISTORY — DX: Incisional hernia without obstruction or gangrene: K43.2

## 2018-06-05 HISTORY — PX: INSERTION OF MESH: SHX5868

## 2018-06-05 LAB — GLUCOSE, CAPILLARY
Glucose-Capillary: 150 mg/dL — ABNORMAL HIGH (ref 70–99)
Glucose-Capillary: 196 mg/dL — ABNORMAL HIGH (ref 70–99)
Glucose-Capillary: 213 mg/dL — ABNORMAL HIGH (ref 70–99)
Glucose-Capillary: 250 mg/dL — ABNORMAL HIGH (ref 70–99)

## 2018-06-05 SURGERY — REPAIR, HERNIA, INCISIONAL, LAPAROSCOPIC
Anesthesia: Regional

## 2018-06-05 MED ORDER — SODIUM CHLORIDE 0.9 % IR SOLN
Status: DC | PRN
  Administered 2018-06-05: 1000 mL

## 2018-06-05 MED ORDER — DEXTROSE-NACL 5-0.9 % IV SOLN
INTRAVENOUS | Status: DC
  Administered 2018-06-05: 17:00:00 via INTRAVENOUS

## 2018-06-05 MED ORDER — GABAPENTIN 300 MG PO CAPS
300.0000 mg | ORAL_CAPSULE | ORAL | Status: AC
  Administered 2018-06-05: 300 mg via ORAL
  Filled 2018-06-05: qty 1

## 2018-06-05 MED ORDER — ALBUMIN HUMAN 5 % IV SOLN
INTRAVENOUS | Status: DC | PRN
  Administered 2018-06-05: 09:00:00 via INTRAVENOUS

## 2018-06-05 MED ORDER — BUPIVACAINE-EPINEPHRINE (PF) 0.25% -1:200000 IJ SOLN
INTRAMUSCULAR | Status: AC
  Filled 2018-06-05: qty 60

## 2018-06-05 MED ORDER — CEFAZOLIN SODIUM-DEXTROSE 2-4 GM/100ML-% IV SOLN
2.0000 g | Freq: Three times a day (TID) | INTRAVENOUS | Status: AC
  Administered 2018-06-05: 2 g via INTRAVENOUS
  Filled 2018-06-05: qty 100

## 2018-06-05 MED ORDER — LEVOTHYROXINE SODIUM 112 MCG PO TABS
112.0000 ug | ORAL_TABLET | ORAL | Status: DC
  Administered 2018-06-06: 112 ug via ORAL
  Filled 2018-06-05: qty 1

## 2018-06-05 MED ORDER — INSULIN ASPART 100 UNIT/ML ~~LOC~~ SOLN
0.0000 [IU] | SUBCUTANEOUS | Status: DC
  Administered 2018-06-05 – 2018-06-06 (×3): 5 [IU] via SUBCUTANEOUS
  Administered 2018-06-06: 3 [IU] via SUBCUTANEOUS
  Administered 2018-06-06: 5 [IU] via SUBCUTANEOUS

## 2018-06-05 MED ORDER — DIAZEPAM 5 MG PO TABS
5.0000 mg | ORAL_TABLET | Freq: Every day | ORAL | Status: DC | PRN
  Administered 2018-06-05: 5 mg via ORAL
  Filled 2018-06-05: qty 1

## 2018-06-05 MED ORDER — LEVOTHYROXINE SODIUM 100 MCG PO TABS
100.0000 ug | ORAL_TABLET | Freq: Every day | ORAL | Status: DC
  Administered 2018-06-06: 100 ug via ORAL
  Filled 2018-06-05: qty 1

## 2018-06-05 MED ORDER — FENTANYL CITRATE (PF) 100 MCG/2ML IJ SOLN
INTRAMUSCULAR | Status: DC | PRN
  Administered 2018-06-05: 100 ug via INTRAVENOUS
  Administered 2018-06-05: 50 ug via INTRAVENOUS
  Administered 2018-06-05 (×2): 25 ug via INTRAVENOUS

## 2018-06-05 MED ORDER — PHENYLEPHRINE 40 MCG/ML (10ML) SYRINGE FOR IV PUSH (FOR BLOOD PRESSURE SUPPORT)
PREFILLED_SYRINGE | INTRAVENOUS | Status: AC
  Filled 2018-06-05: qty 10

## 2018-06-05 MED ORDER — ENOXAPARIN SODIUM 40 MG/0.4ML ~~LOC~~ SOLN
40.0000 mg | SUBCUTANEOUS | Status: DC
  Administered 2018-06-06: 40 mg via SUBCUTANEOUS
  Filled 2018-06-05: qty 0.4

## 2018-06-05 MED ORDER — FENTANYL CITRATE (PF) 250 MCG/5ML IJ SOLN
INTRAMUSCULAR | Status: AC
  Filled 2018-06-05: qty 5

## 2018-06-05 MED ORDER — HYDROMORPHONE HCL 1 MG/ML IJ SOLN
INTRAMUSCULAR | Status: AC
  Filled 2018-06-05: qty 1

## 2018-06-05 MED ORDER — DEXAMETHASONE SODIUM PHOSPHATE 10 MG/ML IJ SOLN
INTRAMUSCULAR | Status: DC | PRN
  Administered 2018-06-05: 10 mg via INTRAVENOUS

## 2018-06-05 MED ORDER — MIDAZOLAM HCL 2 MG/2ML IJ SOLN
INTRAMUSCULAR | Status: AC
  Filled 2018-06-05: qty 2

## 2018-06-05 MED ORDER — FENTANYL CITRATE (PF) 100 MCG/2ML IJ SOLN
100.0000 ug | Freq: Once | INTRAMUSCULAR | Status: DC
  Filled 2018-06-05: qty 2

## 2018-06-05 MED ORDER — LINAGLIPTIN 5 MG PO TABS
5.0000 mg | ORAL_TABLET | Freq: Every day | ORAL | Status: DC
  Administered 2018-06-06: 5 mg via ORAL
  Filled 2018-06-05: qty 1

## 2018-06-05 MED ORDER — LACTATED RINGERS IV SOLN: INTRAVENOUS | Status: DC

## 2018-06-05 MED ORDER — CHLORHEXIDINE GLUCONATE CLOTH 2 % EX PADS: 6.0000 | MEDICATED_PAD | Freq: Once | CUTANEOUS | Status: DC

## 2018-06-05 MED ORDER — METHOCARBAMOL 500 MG PO TABS
500.0000 mg | ORAL_TABLET | Freq: Four times a day (QID) | ORAL | Status: DC | PRN
  Administered 2018-06-05 – 2018-06-06 (×2): 500 mg via ORAL
  Filled 2018-06-05 (×3): qty 1

## 2018-06-05 MED ORDER — ONDANSETRON HCL 4 MG/2ML IJ SOLN
INTRAMUSCULAR | Status: DC | PRN
  Administered 2018-06-05: 4 mg via INTRAVENOUS

## 2018-06-05 MED ORDER — ROCURONIUM BROMIDE 50 MG/5ML IV SOSY
PREFILLED_SYRINGE | INTRAVENOUS | Status: DC | PRN
  Administered 2018-06-05: 50 mg via INTRAVENOUS
  Administered 2018-06-05: 10 mg via INTRAVENOUS

## 2018-06-05 MED ORDER — ONDANSETRON 4 MG PO TBDP: 4.0000 mg | ORAL_TABLET | Freq: Four times a day (QID) | ORAL | Status: DC | PRN

## 2018-06-05 MED ORDER — FENTANYL CITRATE (PF) 100 MCG/2ML IJ SOLN
50.0000 ug | INTRAMUSCULAR | Status: DC | PRN
  Administered 2018-06-05: 50 ug via INTRAVENOUS
  Filled 2018-06-05: qty 2

## 2018-06-05 MED ORDER — LISINOPRIL-HYDROCHLOROTHIAZIDE 20-25 MG PO TABS: 1.0000 | ORAL_TABLET | Freq: Every day | ORAL | Status: DC

## 2018-06-05 MED ORDER — CEFAZOLIN SODIUM-DEXTROSE 2-4 GM/100ML-% IV SOLN
2.0000 g | INTRAVENOUS | Status: AC
  Administered 2018-06-05: 2 g via INTRAVENOUS
  Filled 2018-06-05: qty 100

## 2018-06-05 MED ORDER — MIDAZOLAM HCL 2 MG/2ML IJ SOLN
2.0000 mg | Freq: Once | INTRAMUSCULAR | Status: DC
  Filled 2018-06-05: qty 2

## 2018-06-05 MED ORDER — AMPHETAMINE-DEXTROAMPHET ER 10 MG PO CP24: 30.0000 mg | ORAL_CAPSULE | Freq: Every day | ORAL | Status: DC

## 2018-06-05 MED ORDER — HYDROCHLOROTHIAZIDE 25 MG PO TABS
25.0000 mg | ORAL_TABLET | Freq: Every day | ORAL | Status: DC
  Administered 2018-06-05 – 2018-06-06 (×2): 25 mg via ORAL
  Filled 2018-06-05 (×2): qty 1

## 2018-06-05 MED ORDER — LISINOPRIL 20 MG PO TABS
20.0000 mg | ORAL_TABLET | Freq: Every day | ORAL | Status: DC
  Administered 2018-06-05 – 2018-06-06 (×2): 20 mg via ORAL
  Filled 2018-06-05 (×2): qty 1

## 2018-06-05 MED ORDER — SIMETHICONE 80 MG PO CHEW: 40.0000 mg | CHEWABLE_TABLET | Freq: Four times a day (QID) | ORAL | Status: DC | PRN

## 2018-06-05 MED ORDER — PROPOFOL 10 MG/ML IV BOLUS
INTRAVENOUS | Status: DC | PRN
  Administered 2018-06-05: 150 mg via INTRAVENOUS

## 2018-06-05 MED ORDER — DILTIAZEM HCL ER COATED BEADS 240 MG PO TB24
240.0000 mg | ORAL_TABLET | Freq: Every day | ORAL | Status: DC
  Administered 2018-06-05: 240 mg via ORAL
  Filled 2018-06-05 (×2): qty 1

## 2018-06-05 MED ORDER — 0.9 % SODIUM CHLORIDE (POUR BTL) OPTIME
TOPICAL | Status: DC | PRN
  Administered 2018-06-05: 1000 mL

## 2018-06-05 MED ORDER — OXYCODONE HCL 5 MG PO TABS
5.0000 mg | ORAL_TABLET | ORAL | Status: DC | PRN
  Administered 2018-06-05 – 2018-06-06 (×4): 10 mg via ORAL
  Filled 2018-06-05 (×4): qty 2

## 2018-06-05 MED ORDER — GABAPENTIN 300 MG PO CAPS
300.0000 mg | ORAL_CAPSULE | Freq: Two times a day (BID) | ORAL | Status: DC
  Administered 2018-06-05 – 2018-06-06 (×2): 300 mg via ORAL
  Filled 2018-06-05 (×3): qty 1

## 2018-06-05 MED ORDER — PANTOPRAZOLE SODIUM 40 MG PO TBEC
40.0000 mg | DELAYED_RELEASE_TABLET | Freq: Every day | ORAL | Status: DC
  Administered 2018-06-05 – 2018-06-06 (×2): 40 mg via ORAL
  Filled 2018-06-05 (×2): qty 1

## 2018-06-05 MED ORDER — TRAMADOL HCL 50 MG PO TABS
50.0000 mg | ORAL_TABLET | Freq: Four times a day (QID) | ORAL | Status: DC | PRN
  Filled 2018-06-05 (×2): qty 1

## 2018-06-05 MED ORDER — PROMETHAZINE HCL 25 MG/ML IJ SOLN: 6.2500 mg | INTRAMUSCULAR | Status: DC | PRN

## 2018-06-05 MED ORDER — HYDRALAZINE HCL 20 MG/ML IJ SOLN: 10.0000 mg | INTRAMUSCULAR | Status: DC | PRN

## 2018-06-05 MED ORDER — SODIUM CHLORIDE 0.9 % IV SOLN
INTRAVENOUS | Status: DC | PRN
  Administered 2018-06-05: 50 ug/min via INTRAVENOUS

## 2018-06-05 MED ORDER — ONDANSETRON HCL 4 MG/2ML IJ SOLN: 4.0000 mg | Freq: Four times a day (QID) | INTRAMUSCULAR | Status: DC | PRN

## 2018-06-05 MED ORDER — SUGAMMADEX SODIUM 500 MG/5ML IV SOLN
INTRAVENOUS | Status: DC | PRN
  Administered 2018-06-05: 400 mg via INTRAVENOUS

## 2018-06-05 MED ORDER — LIDOCAINE 2% (20 MG/ML) 5 ML SYRINGE
INTRAMUSCULAR | Status: DC | PRN
  Administered 2018-06-05: 60 mg via INTRAVENOUS

## 2018-06-05 MED ORDER — EPHEDRINE 5 MG/ML INJ
INTRAVENOUS | Status: AC
  Filled 2018-06-05: qty 10

## 2018-06-05 MED ORDER — METFORMIN HCL ER 500 MG PO TB24
1000.0000 mg | ORAL_TABLET | Freq: Every day | ORAL | Status: DC
  Administered 2018-06-06: 1000 mg via ORAL
  Filled 2018-06-05: qty 2

## 2018-06-05 MED ORDER — CELECOXIB 200 MG PO CAPS
200.0000 mg | ORAL_CAPSULE | Freq: Two times a day (BID) | ORAL | Status: DC
  Administered 2018-06-05 – 2018-06-06 (×2): 200 mg via ORAL
  Filled 2018-06-05 (×3): qty 1

## 2018-06-05 MED ORDER — BUTORPHANOL TARTRATE 10 MG/ML NA SOLN: 1.0000 | Freq: Three times a day (TID) | NASAL | Status: DC | PRN

## 2018-06-05 MED ORDER — SITAGLIPTIN PHOS-METFORMIN HCL 50-1000 MG PO TABS: 1.0000 | ORAL_TABLET | Freq: Every day | ORAL | Status: DC

## 2018-06-05 MED ORDER — SODIUM CHLORIDE 0.9 % IV SOLN
INTRAVENOUS | Status: DC | PRN
  Administered 2018-06-05: 20 mL

## 2018-06-05 MED ORDER — AMPHETAMINE-DEXTROAMPHET ER 10 MG PO CP24
20.0000 mg | ORAL_CAPSULE | Freq: Every day | ORAL | Status: DC
  Administered 2018-06-06: 20 mg via ORAL
  Filled 2018-06-05: qty 2

## 2018-06-05 MED ORDER — LORATADINE 10 MG PO TABS
10.0000 mg | ORAL_TABLET | Freq: Every day | ORAL | Status: DC
  Administered 2018-06-05 – 2018-06-06 (×2): 10 mg via ORAL
  Filled 2018-06-05 (×2): qty 1

## 2018-06-05 MED ORDER — EPHEDRINE SULFATE 50 MG/ML IJ SOLN
INTRAMUSCULAR | Status: DC | PRN
  Administered 2018-06-05: 10 mg via INTRAVENOUS

## 2018-06-05 MED ORDER — BUPIVACAINE LIPOSOME 1.3 % IJ SUSP
20.0000 mL | Freq: Once | INTRAMUSCULAR | Status: DC
  Filled 2018-06-05: qty 20

## 2018-06-05 MED ORDER — HYDROMORPHONE HCL 1 MG/ML IJ SOLN
0.2500 mg | INTRAMUSCULAR | Status: DC | PRN
  Administered 2018-06-05 (×3): 0.5 mg via INTRAVENOUS

## 2018-06-05 MED ORDER — MIDAZOLAM HCL 5 MG/5ML IJ SOLN
INTRAMUSCULAR | Status: DC | PRN
  Administered 2018-06-05: 2 mg via INTRAVENOUS

## 2018-06-05 MED ORDER — ACETAMINOPHEN 500 MG PO TABS
1000.0000 mg | ORAL_TABLET | ORAL | Status: AC
  Administered 2018-06-05: 1000 mg via ORAL
  Filled 2018-06-05: qty 2

## 2018-06-05 MED ORDER — TRAZODONE HCL 100 MG PO TABS
100.0000 mg | ORAL_TABLET | Freq: Every day | ORAL | Status: DC
  Administered 2018-06-05: 100 mg via ORAL
  Filled 2018-06-05: qty 1

## 2018-06-05 MED ORDER — SCOPOLAMINE 1 MG/3DAYS TD PT72
MEDICATED_PATCH | TRANSDERMAL | Status: AC
  Administered 2018-06-05: 1.5 mg via TRANSDERMAL
  Filled 2018-06-05: qty 1

## 2018-06-05 MED ORDER — PHENYLEPHRINE HCL 10 MG/ML IJ SOLN
INTRAMUSCULAR | Status: DC | PRN
  Administered 2018-06-05: 80 ug via INTRAVENOUS
  Administered 2018-06-05 (×2): 120 ug via INTRAVENOUS

## 2018-06-05 MED ORDER — LACTATED RINGERS IV SOLN
INTRAVENOUS | Status: DC | PRN
  Administered 2018-06-05 (×2): via INTRAVENOUS

## 2018-06-05 MED ORDER — BUPIVACAINE-EPINEPHRINE 0.25% -1:200000 IJ SOLN
INTRAMUSCULAR | Status: DC | PRN
  Administered 2018-06-05: 20 mL

## 2018-06-05 MED ORDER — CELECOXIB 200 MG PO CAPS
400.0000 mg | ORAL_CAPSULE | ORAL | Status: AC
  Administered 2018-06-05: 400 mg via ORAL
  Filled 2018-06-05: qty 2

## 2018-06-05 MED ORDER — SCOPOLAMINE 1 MG/3DAYS TD PT72
1.0000 | MEDICATED_PATCH | TRANSDERMAL | Status: DC
  Administered 2018-06-05: 1.5 mg via TRANSDERMAL

## 2018-06-05 MED ORDER — OXYCODONE HCL 5 MG/5ML PO SOLN: 5.0000 mg | Freq: Once | ORAL | Status: DC | PRN

## 2018-06-05 MED ORDER — OXYCODONE HCL 5 MG PO TABS: 5.0000 mg | ORAL_TABLET | Freq: Once | ORAL | Status: DC | PRN

## 2018-06-05 MED ORDER — PROPOFOL 10 MG/ML IV BOLUS
INTRAVENOUS | Status: AC
  Filled 2018-06-05: qty 20

## 2018-06-05 SURGICAL SUPPLY — 55 items
APPLIER CLIP ROT 10 11.4 M/L (STAPLE)
BINDER ABDOMINAL 12 ML 46-62 (SOFTGOODS) IMPLANT
BLADE CLIPPER SURG (BLADE) IMPLANT
CANISTER SUCT 3000ML PPV (MISCELLANEOUS) ×3 IMPLANT
CHLORAPREP W/TINT 26ML (MISCELLANEOUS) ×3 IMPLANT
CLIP APPLIE ROT 10 11.4 M/L (STAPLE) IMPLANT
COVER SURGICAL LIGHT HANDLE (MISCELLANEOUS) ×3 IMPLANT
COVER WAND RF STERILE (DRAPES) IMPLANT
DERMABOND ADVANCED (GAUZE/BANDAGES/DRESSINGS) ×1
DERMABOND ADVANCED .7 DNX12 (GAUZE/BANDAGES/DRESSINGS) ×2 IMPLANT
DEVICE SECURE STRAP 25 ABSORB (INSTRUMENTS) ×9 IMPLANT
DEVICE TROCAR PUNCTURE CLOSURE (ENDOMECHANICALS) ×3 IMPLANT
DRAPE WARM FLUID 44X44 (DRAPE) ×3 IMPLANT
ELECT CAUTERY BLADE 6.4 (BLADE) ×3 IMPLANT
ELECT REM PT RETURN 9FT ADLT (ELECTROSURGICAL) ×3
ELECTRODE REM PT RTRN 9FT ADLT (ELECTROSURGICAL) ×2 IMPLANT
GLOVE BIO SURGEON STRL SZ8 (GLOVE) ×3 IMPLANT
GLOVE BIOGEL PI IND STRL 8 (GLOVE) ×4 IMPLANT
GLOVE BIOGEL PI INDICATOR 8 (GLOVE) ×2
GLOVE ECLIPSE 8.0 STRL XLNG CF (GLOVE) ×3 IMPLANT
GOWN STRL REUS W/ TWL LRG LVL3 (GOWN DISPOSABLE) ×4 IMPLANT
GOWN STRL REUS W/ TWL XL LVL3 (GOWN DISPOSABLE) ×2 IMPLANT
GOWN STRL REUS W/TWL 2XL LVL3 (GOWN DISPOSABLE) ×3 IMPLANT
GOWN STRL REUS W/TWL LRG LVL3 (GOWN DISPOSABLE) ×2
GOWN STRL REUS W/TWL XL LVL3 (GOWN DISPOSABLE) ×1
KIT BASIN OR (CUSTOM PROCEDURE TRAY) ×3 IMPLANT
KIT TURNOVER KIT B (KITS) ×3 IMPLANT
MARKER SKIN DUAL TIP RULER LAB (MISCELLANEOUS) ×3 IMPLANT
MESH VENTRALIGHT ST 7X9N (Mesh General) ×3 IMPLANT
NEEDLE 18GX1X1/2 (RX/OR ONLY) (NEEDLE) ×3 IMPLANT
NS IRRIG 1000ML POUR BTL (IV SOLUTION) ×6 IMPLANT
PAD ARMBOARD 7.5X6 YLW CONV (MISCELLANEOUS) ×6 IMPLANT
PENCIL BUTTON HOLSTER BLD 10FT (ELECTRODE) ×3 IMPLANT
SCISSORS LAP 5X35 DISP (ENDOMECHANICALS) ×3 IMPLANT
SET IRRIG TUBING LAPAROSCOPIC (IRRIGATION / IRRIGATOR) IMPLANT
SHEARS HARMONIC ACE PLUS 36CM (ENDOMECHANICALS) ×3 IMPLANT
SLEEVE ENDOPATH XCEL 5M (ENDOMECHANICALS) ×3 IMPLANT
SLEEVE SCD COMPRESS KNEE MED (MISCELLANEOUS) ×3 IMPLANT
SUT MON AB 4-0 PC3 18 (SUTURE) ×6 IMPLANT
SUT NOVA NAB DX-16 0-1 5-0 T12 (SUTURE) ×3 IMPLANT
SUT VIC AB 0 CT1 27 (SUTURE) ×1
SUT VIC AB 0 CT1 27XBRD ANBCTR (SUTURE) ×2 IMPLANT
SUT VIC AB 3-0 SH 27 (SUTURE) ×2
SUT VIC AB 3-0 SH 27XBRD (SUTURE) ×4 IMPLANT
SYR BULB IRRIGATION 50ML (SYRINGE) ×3 IMPLANT
TOWEL OR 17X24 6PK STRL BLUE (TOWEL DISPOSABLE) ×3 IMPLANT
TOWEL OR 17X26 10 PK STRL BLUE (TOWEL DISPOSABLE) IMPLANT
TRAY FOLEY CATH SILVER 16FR (SET/KITS/TRAYS/PACK) ×3 IMPLANT
TRAY LAPAROSCOPIC MC (CUSTOM PROCEDURE TRAY) ×3 IMPLANT
TROCAR XCEL NON-BLD 11X100MML (ENDOMECHANICALS) ×3 IMPLANT
TROCAR XCEL NON-BLD 5MMX100MML (ENDOMECHANICALS) ×9 IMPLANT
TUBE CONNECTING 12X1/4 (SUCTIONS) ×3 IMPLANT
TUBING INSUFFLATION (TUBING) ×3 IMPLANT
WATER STERILE IRR 1000ML POUR (IV SOLUTION) ×3 IMPLANT
YANKAUER SUCT BULB TIP NO VENT (SUCTIONS) ×3 IMPLANT

## 2018-06-05 NOTE — OR Nursing (Signed)
Family updated on progress of surgery
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
## 2018-06-05 NOTE — Anesthesia Postprocedure Evaluation (Signed)
Anesthesia Post Note  Patient: Brittany Rubio  Procedure(s) Performed: LAPAROSCOPIC [place] (N/A ) INSERTION OF MESH (N/A )     Patient location during evaluation: PACU Anesthesia Type: General Level of consciousness: awake and alert Pain management: pain level controlled Vital Signs Assessment: post-procedure vital signs reviewed and stable Respiratory status: spontaneous breathing, nonlabored ventilation, respiratory function stable and patient connected to nasal cannula oxygen Cardiovascular status: blood pressure returned to baseline and stable Postop Assessment: no apparent nausea or vomiting Anesthetic complications: no    Last Vitals:  Vitals:   06/05/18 1420 06/05/18 1450  BP: (!) 146/83 131/80  Pulse: 99 (!) 101  Resp: 18 20  Temp:    SpO2: 100% 96%    Last Pain:  Vitals:   06/05/18 1445  TempSrc:   PainSc: 7                  Robyn Galati P Becky Colan

## 2018-06-05 NOTE — Transfer of Care (Signed)
Immediate Anesthesia Transfer of Care Note  Patient: Brittany Rubio  Procedure(s) Performed: LAPAROSCOPIC [place] (N/A ) INSERTION OF MESH (N/A )  Patient Location: PACU  Anesthesia Type:General  Level of Consciousness: sedated  Airway & Oxygen Therapy: Patient Spontanous Breathing and Patient connected to nasal cannula oxygen  Post-op Assessment: Report given to RN, Post -op Vital signs reviewed and stable and Patient moving all extremities X 4  Post vital signs: Reviewed and stable  Last Vitals:  Vitals Value Taken Time  BP 110/62 06/05/2018 11:05 AM  Temp    Pulse 96 06/05/2018 11:05 AM  Resp 31 06/05/2018 11:05 AM  SpO2 96 % 06/05/2018 11:05 AM  Vitals shown include unvalidated device data.  Last Pain:  Vitals:   06/05/18 0651  TempSrc: Oral  PainSc: 0-No pain      Patients Stated Pain Goal: 0 (68/12/75 1700)  Complications: No apparent anesthesia complications

## 2018-06-05 NOTE — Interval H&P Note (Signed)
History and Physical Interval Note:  06/05/2018 7:26 AM  Brittany Rubio  has presented today for surgery, with the diagnosis of incisional hernia and bilateral inguinal hernias  The various methods of treatment have been discussed with the patient and family. After consideration of risks, benefits and other options for treatment, the patient has consented to  Procedure(s) with comments: [place] (N/A) - TAP BLOCK LAPAROSCOPIC BILATERAL INGUINAL HERNIA REPAIR (Bilateral) INSERTION OF MESH (Bilateral) as a surgical intervention .  The patient's history has been reviewed, patient examined, no change in status, stable for surgery.  I have reviewed the patient's chart and labs.  Questions were answered to the patient's satisfaction.     [place]

## 2018-06-05 NOTE — Anesthesia Procedure Notes (Signed)
Procedure Name: Intubation Date/Time: 06/05/2018 8:11 AM Performed by: Neldon Newport, CRNA Pre-anesthesia Checklist: Timeout performed, Patient being monitored, Suction available, Emergency Drugs available and Patient identified Patient Re-evaluated:Patient Re-evaluated prior to induction Oxygen Delivery Method: Circle system utilized Preoxygenation: Pre-oxygenation with 100% oxygen Induction Type: IV induction Ventilation: Mask ventilation without difficulty and Oral airway inserted - appropriate to patient size Laryngoscope Size: Mac and 3 Grade View: Grade I Tube type: Oral Tube size: 7.0 mm Number of attempts: 1 Placement Confirmation: ETT inserted through vocal cords under direct vision,  CO2 detector and breath sounds checked- equal and bilateral Secured at: 21 cm Tube secured with: Tape Dental Injury: Teeth and Oropharynx as per pre-operative assessment

## 2018-06-05 NOTE — Op Note (Addendum)
Preoperative diagnosis: #1 incisional hernia #2 possible bilateral inguinal  hernia  Postoperative diagnosis: Incisional hernia without obstruction or incarceration with no evidence of inguinal hernia bilaterally     Procedure: laparoscopic repair of incisional hernia with mesh  Surgeon Erroll Luna, MD  Anesthesia: General with Exparel local  EBL: 20 cc  Drains: None  Assistant:OR staff AND Garnet Koyanagi RNFA   IV fluids:: Per anesthesia record  Indications for procedure: The patient is a 57 year old female with an incisional hernia.  CT scan also showed possible inguinal hernia.  I recommend a less Laparoscopic approach to her incisional hernia repair with mesh.  Risk, benefits and other options discussed.The risk of hernia repair include bleeding,  Infection,   Recurrence of the hernia,  Mesh use, chronic pain,  Organ injury,  Bowel injury,  Bladder injury,   nerve injury with numbness around the incision,  Death,  and worsening of preexisting  medical problems.  The alternatives to surgery have been discussed as well..  Long term expectations of both operative and non operative treatments have been discussed.   The patient agrees to proceed.     Description of procedure: The patient was met in the holding area.  Questions were answered.  She was taken back to the operative room placed supine upon the operating room table.  After induction of general anesthesia, Foley catheter was placed under sterile conditions.  Both arms were tucked and padded appropriately.  The abdomen was prepped and draped in a sterile fashion.  Timeout was done to verify proper patient and procedure.  A left upper quadrant 5 mm incision was made and a 5 mill meter Optiview port was placed under direct vision passing through layers of the abdominal wall without evidence of injury.  We entered the abdominal cavity and insufflated to 15 mmHg of CO2 pressure.  Laparoscopic looks dentures were placed.  There were  multiple midline adhesions.  I then placed 3 other 5 mm ports one left lower quadrant, right lower quadrant and right upper quadrant.  Adhesions were taken down to the midline which were omental with the harmonic scalpel.  No bowel was injured during this process.  Once all the midline adhesions were taken down there are multiple Swiss cheese defects are on the lower part of the previous laparotomy incision.  These were to to 3 cm in size and there are a total of 3 of them.  The upper part of the incision was intact.  Upon inspection of the pelvis there is no evidence of inguinal hernia. Exparel 20 cc diluted with 20 cc of saline was injected along both rectus sheaths for pain control.  This was done under laparoscopic guidance.  I measured the fascial defect.  There is multiple areas of weakness along the lower part of the laparotomy incision.  The entire area measured about 15 x 18 cm.  I used a ventral light coated mesh.  This was about 18 cm in maximal diameter.  We used the 17.9 x 22.9 CM  size.  4 sutures were placed in the right side of this.  This was then wet.  There enrolled.  I made a small midline incision.  This was through the previous defect.  We placed the mesh to the small midline incision and closed it with 0 Vicryl.  Laparoscopic instrument was then were used to unfurled the mesh.  Suture passer was used to pull all 4 sutures up to the abdominal wall with no evidence of pleating  or redundancy of the mesh.  Discovered this area quite nicely.  The sutures were tied down.  The  secure strap used to secure the edges of the mesh circumferentially in 2 layers.  Upon inspection the mesh laid quite nicely.  There is no undue tension.  Upon inspection of the abdominal contents there is no evidence of bowel injury or organ injury.  There is no bleeding.  The omentum was pulled down below the mesh to cover the bowel.  We then deinsufflated the abdominal cavity and removed our ports.  All port sites were  closed with 4-0 Monocryl.  Dermabond was used to close close incisions as well.  Abdominal binder placed.  All final counts found to be correct.  The patient was awoke extubated taken to recovery in satisfactory condition.

## 2018-06-06 ENCOUNTER — Encounter (HOSPITAL_COMMUNITY): Payer: Self-pay | Admitting: Surgery

## 2018-06-06 DIAGNOSIS — K432 Incisional hernia without obstruction or gangrene: Secondary | ICD-10-CM | POA: Diagnosis not present

## 2018-06-06 LAB — GLUCOSE, CAPILLARY
Glucose-Capillary: 185 mg/dL — ABNORMAL HIGH (ref 70–99)
Glucose-Capillary: 224 mg/dL — ABNORMAL HIGH (ref 70–99)
Glucose-Capillary: 241 mg/dL — ABNORMAL HIGH (ref 70–99)

## 2018-06-06 MED ORDER — OXYCODONE HCL 5 MG PO TABS: 5.0000 mg | ORAL_TABLET | ORAL | 0 refills | Status: DC | PRN

## 2018-06-06 MED ORDER — IBUPROFEN 800 MG PO TABS: 800.0000 mg | ORAL_TABLET | Freq: Three times a day (TID) | ORAL | 0 refills | Status: DC | PRN

## 2018-06-06 MED ORDER — METHOCARBAMOL 500 MG PO TABS: 500.0000 mg | ORAL_TABLET | Freq: Four times a day (QID) | ORAL | 0 refills | Status: DC | PRN

## 2018-06-06 MED ORDER — POLYETHYLENE GLYCOL 3350 17 GM/SCOOP PO POWD: 17.0000 g | Freq: Every day | ORAL | 1 refills | Status: DC

## 2018-06-06 NOTE — Progress Notes (Signed)
1 Day Post-Op   Subjective/Chief Complaint: Sore but ok   Objective: Vital signs in last 24 hours: Temp:  [97 F (36.1 C)-99.6 F (37.6 C)] 99.4 F (37.4 C) (11/22 0506) Pulse Rate:  [91-113] 99 (11/22 0506) Resp:  [16-28] 17 (11/22 0506) BP: (100-146)/(62-83) 137/75 (11/22 0506) SpO2:  [89 %-100 %] 93 % (11/22 0506) Last BM Date: 06/03/18  Intake/Output from previous day: 11/21 0701 - 11/22 0700 In: 3203.2 [P.O.:600; I.V.:2253.2; IV Piggyback:350] Out: 510 [Urine:500; Blood:10] Intake/Output this shift: No intake/output data recorded.  Incision/Wound:CDI soft sore port sites intact  Lab Results:  No results for input(s): WBC, HGB, HCT, PLT in the last 72 hours. BMET No results for input(s): NA, K, CL, CO2, GLUCOSE, BUN, CREATININE, CALCIUM in the last 72 hours. PT/INR No results for input(s): LABPROT, INR in the last 72 hours. ABG No results for input(s): PHART, HCO3 in the last 72 hours.  Invalid input(s): PCO2, PO2  Studies/Results: No results found.  Anti-infectives: Anti-infectives (From admission, onward)   Start     Dose/Rate Route Frequency Ordered Stop   06/05/18 1700  ceFAZolin (ANCEF) IVPB 2g/100 mL premix     2 g 200 mL/hr over 30 Minutes Intravenous Every 8 hours 06/05/18 1520 06/05/18 2026   06/05/18 0630  ceFAZolin (ANCEF) IVPB 2g/100 mL premix     2 g 200 mL/hr over 30 Minutes Intravenous On call to O.R. 06/05/18 0623 06/05/18 0817      Assessment/Plan: s/p Procedure(s): LAPAROSCOPIC INCISIONAL HERNIA ERAS PATHWAY (N/A) INSERTION OF MESH (N/A) Discharge  LOS: 0 days    Joyice Faster Adasha Boehme 06/06/2018

## 2018-06-06 NOTE — Discharge Instructions (Signed)
[facility] _______[facility] ° °UMBILICAL OR INGUINAL HERNIA REPAIR: POST OP INSTRUCTIONS ° °Always review your discharge instruction sheet given to you by the facility where your surgery was performed. °IF YOU HAVE DISABILITY OR FAMILY LEAVE FORMS, YOU MUST BRING THEM TO THE OFFICE FOR PROCESSING.   °DO NOT GIVE THEM TO YOUR DOCTOR. ° °1. A  prescription for pain medication may be given to you upon discharge.  Take your pain medication as prescribed, if needed.  If narcotic pain medicine is not needed, then you may take acetaminophen (Tylenol) or ibuprofen (Advil) as needed. °2. Take your usually prescribed medications unless otherwise directed. °If you need a refill on your pain medication, please contact your pharmacy.  They will contact our office to request authorization. Prescriptions will not be filled after 5 pm or on week-ends. °3. You should follow a light diet the first 24 hours after arrival home, such as soup and crackers, etc.  Be sure to include lots of fluids daily.  Resume your normal diet the day after surgery. °4.Most patients will experience some swelling and bruising around the umbilicus or in the groin and scrotum.  Ice packs and reclining will help.  Swelling and bruising can take several days to resolve.  °6. It is common to experience some constipation if taking pain medication after surgery.  Increasing fluid intake and taking a stool softener (such as Colace) will usually help or prevent this problem from occurring.  A mild laxative (Milk of Magnesia or Miralax) should be taken according to package directions if there are no bowel movements after 48 hours. °7. Unless discharge instructions indicate otherwise, you may remove your bandages 24-48 hours after surgery, and you may shower at that time.  You may have steri-strips (small skin tapes) in place directly over the incision.  These strips should be left on the skin for 7-10 days.  If your surgeon used skin glue on the  incision, you may shower in 24 hours.  The glue will flake off over the next 2-3 weeks.  Any sutures or staples will be removed at the office during your follow-up visit. °8. ACTIVITIES:  You may resume regular (light) daily activities beginning the next day--such as daily self-care, walking, climbing stairs--gradually increasing activities as tolerated.  You may have sexual intercourse when it is comfortable.  Refrain from any heavy lifting or straining until approved by your doctor. ° °a.You may drive when you are no longer taking prescription pain medication, you can comfortably wear a seatbelt, and you can safely maneuver your car and apply brakes. °b.RETURN TO WORK:   °_____________________________________________ ° °9.You should see your doctor in the office for a follow-up appointment approximately 2-3 weeks after your surgery.  Make sure that you call for this appointment within a day or two after you arrive home to insure a convenient appointment time. °10.OTHER INSTRUCTIONS: _________________________ °   _____________________________________ ° °WHEN TO CALL YOUR DOCTOR: °1. Fever over 101.0 °2. Inability to urinate °3. Nausea and/or vomiting °4. Extreme swelling or bruising °5. Continued bleeding from incision. °6. Increased pain, redness, or drainage from the incision ° °The clinic staff is available to answer your questions during regular business hours.  Please don’t hesitate to call and ask to speak to one of the nurses for clinical concerns.  If you have a medical emergency, go to the nearest emergency room or call 911.  A surgeon from [facility] is always on call at the hospital ° ° °  [address], [place], [place], [place]  [address] ?  [address], [place], [place]   [address] [phone] ? [phone] ? FAX [phone] Web site: [email or web address]   LAPAROSCOPIC SURGERY: POST OP  INSTRUCTIONS  ######################################################################  EAT Gradually transition to a high fiber diet with a fiber supplement over the next few weeks after discharge.  Start with a pureed / full liquid diet (see below)  WALK Walk an hour a day.  Control your pain to do that.    CONTROL PAIN Control pain so that you can walk, sleep, tolerate sneezing/coughing, go up/down stairs.  HAVE A BOWEL MOVEMENT DAILY Keep your bowels regular to avoid problems.  OK to try a laxative to override constipation.  OK to use an antidairrheal to slow down diarrhea.  Call if not better after 2 tries  CALL IF YOU HAVE PROBLEMS/CONCERNS Call if you are still struggling despite following these instructions. Call if you have concerns not answered by these instructions  ######################################################################    1. DIET: Follow a light bland diet the first 24 hours after arrival home, such as soup, liquids, crackers, etc.  Be sure to include lots of fluids daily.  Avoid fast food or heavy meals as your are more likely to get nauseated.  Eat a low fat the next few days after surgery.    2. Take your usually prescribed home medications unless otherwise directed.  3. PAIN CONTROL: a. Pain is best controlled by a usual combination of three different methods TOGETHER: i. Ice/Heat ii. Over the counter pain medication iii. Prescription pain medication b. Most patients will experience some swelling and bruising around the incisions.  Ice packs or heating pads (30-60 minutes up to 6 times a day) will help. Use ice for the first few days to help decrease swelling and bruising, then switch to heat to help relax tight/sore spots and speed recovery.  Some people prefer to use ice alone, heat alone, alternating between ice & heat.  Experiment to what works for you.  Swelling and bruising can take several weeks to resolve.   c. It is helpful to take an  over-the-counter pain medication regularly for the first few weeks.  Choose one of the following that works best for you: i. Naproxen (Aleve, etc)  Two 220mg  tabs twice a day ii. Ibuprofen (Advil, etc) Three 200mg  tabs four times a day (every meal & bedtime) iii. Acetaminophen (Tylenol, etc) 500-650mg  four times a day (every meal & bedtime) d. A  prescription for pain medication (such as oxycodone, hydrocodone, tramadol, gabapentin, methocarbamol, etc) should be given to you upon discharge.  Take your pain medication as prescribed.  i. If you are having problems/concerns with the prescription medicine (does not control pain, nausea, vomiting, rash, itching, etc), please call us [phone] to see if we need to switch you to a different pain medicine that will work better for you and/or control your side effect better. ii. If you need a refill on your pain medication, please give [place] 48 hour notice.  contact your pharmacy.  They will contact our office to request authorization. Prescriptions will not be filled after 5 pm or on week-ends  4. Avoid getting constipated.   a. Between the surgery and the pain medications, it is common to experience some constipation.   b. Increasing fluid intake and taking a fiber supplement (such as Metamucil, Citrucel, FiberCon, MiraLax, etc) 1-2 times a day regularly will usually help prevent this problem from occurring.  c. A mild laxative (prune juice, Milk of Magnesia, MiraLax, etc) should be taken according to package directions if there are no bowel movements after 48 hours.   5. Watch out for diarrhea.   a. If you have many loose bowel movements, simplify your diet to bland foods & liquids for a few days.   b. Stop any stool softeners and decrease your fiber supplement.   c. Switching to mild anti-diarrheal medications (Kayopectate, Pepto Bismol) can help.   d. If this worsens or does not improve, please call us.  6. Wash / shower every day.  You may shower  over the dressings as they are waterproof.  Continue to shower over incision(s) after the dressing is off.  7. Remove your waterproof bandages 5 days after surgery.  You may leave the incision open to air.  You may replace a dressing/Band-Aid to cover the incision for comfort if you wish.   8. ACTIVITIES as tolerated:   a. You may resume regular (light) daily activities beginning the next day--such as daily self-care, walking, climbing stairs--gradually increasing activities as tolerated.  If you can walk 30 minutes without difficulty, it is safe to try more intense activity such as jogging, treadmill, bicycling, low-impact aerobics, swimming, etc. b. Save the most intensive and strenuous activity for last such as sit-ups, heavy lifting, contact sports, etc  Refrain from any heavy lifting or straining until you are off narcotics for pain control.   c. DO NOT PUSH THROUGH PAIN.  Let pain be your guide: If it hurts to do something, don't do it.  Pain is your body warning you to avoid that activity for another week until the pain goes down. d. You may drive when you are no longer taking prescription pain medication, you can comfortably wear a seatbelt, and you can safely maneuver your car and apply brakes. e. Dennis Bast may have sexual intercourse when it is comfortable.  9. FOLLOW UP in our office a. Please call [facility] at [phone] to set up an appointment to see your surgeon in the office for a follow-up appointment approximately 2-3 weeks after your surgery. b. Make sure that you call for this appointment the day you arrive home to insure a convenient appointment time.  10. IF YOU HAVE DISABILITY OR FAMILY LEAVE FORMS, BRING THEM TO THE OFFICE FOR PROCESSING.  DO NOT GIVE THEM TO YOUR DOCTOR.   WHEN TO CALL us [phone]: 1. Poor pain control 2. Reactions / problems with new medications (rash/itching, nausea, etc)  3. Fever over 101.5 F (38.5 C) 4. Inability to urinate 5. Nausea and/or  vomiting 6. Worsening swelling or bruising 7. Continued bleeding from incision. 8. Increased pain, redness, or drainage from the incision   The clinic staff is available to answer your questions during regular business hours (8:30am-5pm).  Please dont hesitate to call and ask to speak to one of our nurses for clinical concerns.   If you have a medical emergency, go to the nearest emergency room or call 911.  A surgeon from [facility] is always on call at the [facility], [place], [place], [place], [place]  [address] ? MAIN: [phone] ? TOLL FREE: [phone] ?  FAX [phone] [email or web address]

## 2018-06-06 NOTE — Discharge Summary (Signed)
Physician Discharge Summary  Patient ID: Brittany Rubio MRN: [id number] DOB/AGE: 08/13/1960 57 y.o.  Admit date: 06/05/2018 Discharge date: 06/06/2018  Admission Diagnoses:incisional hernia   Discharge Diagnoses:  Active Problems:   Incisional hernia without obstruction or gangrene   Discharged Condition: good  Hospital Course: Pt did well She was tolerating a diet, had adequate pain control, and was ambulating without difficulty.   Consults: None    Treatments: surgery: laparoscopic incisional hernia repair with mesh   Discharge Exam: Blood pressure 137/75, pulse 99, temperature 99.4 F (37.4 C), temperature source Oral, resp. rate 17, height 5' 4.5" (1.638 m), weight 100.2 kg, SpO2 93 %. General appearance: alert and cooperative Resp: clear to auscultation bilaterally Cardio: regular rate and rhythm, S1, S2 normal, no murmur, click, rub or gallop Incision/Wound:CDI port sites sore no hematoma   Disposition: Discharge disposition: 01-Home or Self Care       Discharge Instructions    Diet - low sodium heart healthy   Complete by:  As directed    Increase activity slowly   Complete by:  As directed      Allergies as of 06/06/2018      Reactions   Crab [shellfish Allergy] Hives, Swelling   "Only when I eat too many crab legs" Can tolerate CT scans with contrast-does not need premeds       Medication List    TAKE these medications   amphetamine-dextroamphetamine 20 MG 24 hr capsule Commonly known as:  ADDERALL XR Take 20 mg by mouth daily. Take with 30 mg to equal 50 mg daily   amphetamine-dextroamphetamine 30 MG 24 hr capsule Commonly known as:  ADDERALL XR Take 30 mg by mouth daily. Take with 20 mg to equal 50 mg daily   aspirin EC 81 MG tablet Take 81 mg by mouth daily.   butorphanol 10 MG/ML nasal spray Commonly known as:  STADOL Place 1 spray into the nose 3 (three) times daily as needed for migraine.   cholecalciferol 25 MCG (1000 UT)  tablet Commonly known as:  VITAMIN D3 Take 1,000 Units by mouth daily.   diazepam 5 MG tablet Commonly known as:  VALIUM Take 1 tablet (5 mg total) by mouth daily as needed for anxiety.   EPIPEN 2-PAK 0.3 mg/0.3 mL Soaj injection Generic drug:  EPINEPHrine 0.3 mLs by Subdermal route once as needed. Allergic reaction   esomeprazole 20 MG capsule Commonly known as:  NEXIUM Take 20 mg by mouth daily as needed (acid reflux).   FARXIGA 10 MG Tabs tablet Generic drug:  dapagliflozin propanediol TAKE 1 TABLET BY MOUTH EVERY DAY   fexofenadine 180 MG tablet Commonly known as:  ALLEGRA Take 180 mg by mouth daily as needed for allergies or rhinitis.   ibuprofen 800 MG tablet Commonly known as:  ADVIL,MOTRIN Take 1 tablet (800 mg total) by mouth every 8 (eight) hours as needed. What changed:    medication strength  how much to take  when to take this  reasons to take this   JANUMET 50-1000 MG tablet Generic drug:  sitaGLIPtin-metformin TAKE 1 TABLET BY MOUTH 2 TIMES A DAY WITH MEALS   levothyroxine 112 MCG tablet Commonly known as:  SYNTHROID, LEVOTHROID Take 112 mcg by mouth every morning. Take with 100 mcg to equal 212 mcg daily   levothyroxine 100 MCG tablet Commonly known as:  SYNTHROID, LEVOTHROID Take 100 mcg by mouth daily before breakfast. Take with 112 mcg to equal 212 mcg daily   lisinopril-hydrochlorothiazide 20-25  MG tablet Commonly known as:  PRINZIDE,ZESTORETIC Take 1 tablet by mouth daily.   Magnesium 500 MG Caps Take 500 mg by mouth daily.   MATZIM LA 240 MG 24 hr tablet Generic drug:  diltiazem Take 240 mg by mouth at bedtime.   methocarbamol 500 MG tablet Commonly known as:  ROBAXIN Take 1 tablet (500 mg total) by mouth every 6 (six) hours as needed for muscle spasms.   oxyCODONE 5 MG immediate release tablet Commonly known as:  Oxy IR/ROXICODONE Take 1-2 tablets (5-10 mg total) by mouth every 4 (four) hours as needed for moderate pain.    polyethylene glycol powder powder Commonly known as:  GLYCOLAX/MIRALAX Take 17 g by mouth daily.   promethazine 25 MG tablet Commonly known as:  PHENERGAN Take 25 mg by mouth every 8 (eight) hours as needed for nausea or vomiting.   ranitidine 150 MG tablet Commonly known as:  ZANTAC Take 150-300 mg by mouth See admin instructions. Take 300 mg in the morning and may take an additional 150 mg at night as needed for heartburn   traZODone 50 MG tablet Commonly known as:  DESYREL Take 100 mg by mouth at bedtime.   Turmeric Curcumin 500 MG Caps Take 500 mg by mouth daily.   VIIBRYD 40 MG Tabs Generic drug:  Vilazodone HCl Take 40 mg by mouth at bedtime.   VITAMIN B-12 PO Take 1 tablet by mouth once a week.   vitamin C 500 MG tablet Commonly known as:  ASCORBIC ACID Take 500 mg by mouth daily.        Signed: Joyice Faster Makyia Erxleben 06/06/2018, 8:17 AM

## 2018-06-20 ENCOUNTER — Other Ambulatory Visit (INDEPENDENT_AMBULATORY_CARE_PROVIDER_SITE_OTHER): Payer: Self-pay | Admitting: Family Medicine

## 2018-06-20 DIAGNOSIS — E038 Other specified hypothyroidism: Secondary | ICD-10-CM

## 2018-06-27 ENCOUNTER — Other Ambulatory Visit: Payer: Self-pay | Admitting: Internal Medicine

## 2018-06-27 NOTE — Telephone Encounter (Signed)
Trazodone refill 

## 2018-07-16 HISTORY — PX: BREAST BIOPSY: SHX20

## 2018-07-23 ENCOUNTER — Other Ambulatory Visit: Payer: Self-pay | Admitting: Internal Medicine

## 2018-08-03 ENCOUNTER — Encounter: Payer: Self-pay | Admitting: Adult Health

## 2018-08-06 ENCOUNTER — Ambulatory Visit (INDEPENDENT_AMBULATORY_CARE_PROVIDER_SITE_OTHER): Payer: BC Managed Care – PPO | Admitting: Family Medicine

## 2018-08-06 ENCOUNTER — Encounter (INDEPENDENT_AMBULATORY_CARE_PROVIDER_SITE_OTHER): Payer: Self-pay | Admitting: Family Medicine

## 2018-08-06 VITALS — BP 161/91 | HR 65 | Temp 98.1°F | Ht 65.0 in | Wt 221.0 lb

## 2018-08-06 DIAGNOSIS — Z6836 Body mass index (BMI) 36.0-36.9, adult: Secondary | ICD-10-CM | POA: Diagnosis not present

## 2018-08-06 DIAGNOSIS — I10 Essential (primary) hypertension: Secondary | ICD-10-CM | POA: Diagnosis not present

## 2018-08-07 ENCOUNTER — Ambulatory Visit: Payer: BC Managed Care – PPO | Admitting: Neurology

## 2018-08-07 ENCOUNTER — Ambulatory Visit: Payer: BC Managed Care – PPO | Admitting: Internal Medicine

## 2018-08-07 ENCOUNTER — Encounter: Payer: Self-pay | Admitting: Neurology

## 2018-08-07 ENCOUNTER — Encounter: Payer: Self-pay | Admitting: Internal Medicine

## 2018-08-07 VITALS — BP 152/92 | HR 100 | Ht 65.0 in | Wt 225.0 lb

## 2018-08-07 VITALS — BP 146/90 | HR 100 | Temp 98.3°F | Ht 65.0 in | Wt 224.6 lb

## 2018-08-07 DIAGNOSIS — Z72821 Inadequate sleep hygiene: Secondary | ICD-10-CM

## 2018-08-07 DIAGNOSIS — K449 Diaphragmatic hernia without obstruction or gangrene: Secondary | ICD-10-CM

## 2018-08-07 DIAGNOSIS — N182 Chronic kidney disease, stage 2 (mild): Secondary | ICD-10-CM

## 2018-08-07 DIAGNOSIS — C50412 Malignant neoplasm of upper-outer quadrant of left female breast: Secondary | ICD-10-CM | POA: Diagnosis not present

## 2018-08-07 DIAGNOSIS — E890 Postprocedural hypothyroidism: Secondary | ICD-10-CM

## 2018-08-07 DIAGNOSIS — I10 Essential (primary) hypertension: Secondary | ICD-10-CM

## 2018-08-07 DIAGNOSIS — G4733 Obstructive sleep apnea (adult) (pediatric): Secondary | ICD-10-CM

## 2018-08-07 DIAGNOSIS — Z171 Estrogen receptor negative status [ER-]: Secondary | ICD-10-CM

## 2018-08-07 DIAGNOSIS — Z9989 Dependence on other enabling machines and devices: Secondary | ICD-10-CM

## 2018-08-07 DIAGNOSIS — I129 Hypertensive chronic kidney disease with stage 1 through stage 4 chronic kidney disease, or unspecified chronic kidney disease: Secondary | ICD-10-CM | POA: Diagnosis not present

## 2018-08-07 DIAGNOSIS — E1122 Type 2 diabetes mellitus with diabetic chronic kidney disease: Secondary | ICD-10-CM | POA: Diagnosis not present

## 2018-08-07 DIAGNOSIS — Z7982 Long term (current) use of aspirin: Secondary | ICD-10-CM

## 2018-08-07 DIAGNOSIS — Z6837 Body mass index (BMI) 37.0-37.9, adult: Secondary | ICD-10-CM

## 2018-08-07 NOTE — Progress Notes (Signed)
SLEEP MEDICINE CLINIC   Provider:  Larey Seat, MD    Primary Care Physician:  Glendale Chard, MD   Referring Provider: Glendale Chard, MD    Chief Complaint  Patient presents with  . Follow-up    pt alone, rm 10. pt had to reorder parts due to her machine having a piece that broke/had a leak. she reordered the parts and states that they should be coming in. DME [facility].     HPI:  Brittany Rubio is an afro-american  58 y.o. female patient, Seen in a RV on 08-07-2018. Since I had seen Brittany Rubio last she had a prolonged leave of absence from her job since mid November, she underwent a hernia surgery.  She return to work on the fifth and needs, with her short-term disability came also a less compliant use at the same time of her CPAP machine.  She states that she noted a significant air leak almost a whistle and contacted [facility] by email but she has still not seen of supply of new equipment holes, headgear or mask.  She used the machine last on 29 June 2018.  In the meantime there have been 5 weeks without CPAP use and I will write a note to [facility] today asking them to please send the supplies so that she can resume a more compliant use.  We discussed again that 4 hours of nightly use of the minimum use of time.  Her average daily usage on days used is 4 hours and 30 minutes, but she has an only 83% compliance by days and 60% compliance by time for October 2019.  The air leaks were indeed very high -between 35.9 L  and 29 L / minute.  She had very few residual apneas 2.7 and most of those are obstructive in origin.  There was no evidence of Cheyne-Stokes or central apnea.  The pressure is set at 12 cmH2O with 3 cm EPR.  Her device is auto titration capable.  FSS 31/ 63 points. Epworth sleepiness score 12 points- currently not using CPAP for 5-6 weeks.       She was seen here as in a referral from Dr. Baird Cancer for a sleep evaluation. Brittany Rubio is a patient of Dr.  Tye Savoy that underwent about 8 years ago a sleep study. The place where her sleep testing took place is no longer existing, reportedly it was close to [address], possible Dr. Konrad Felix laboratory on [address]. She felt that the CPAP was cumbersome but helpful. She felt less sleepy and her sleep may have been deeper but the machine was not easy to use or set up. She is here today partially to see if she still would need CPAP, is unclear what stage of apnea she may be in, and also to see if the different treatment options today that were not available in 2010.  Past medical history is positive for diabetic nephropathy, chronic kidney disease stage II, type 2 diabetes attention deficit disorder since childhood, hypertensive renal disease, allergic rhinitis, joint arthritis, superobesity and obstructive sleep apnea I also reviewed briefly her list of medications. Brittany Rubio is postmenopausal, she does still have insomnia and her obstructive sleep apnea has been untreated for years. The patient had been diagnosed with sleep apnea and was given a CPAP machine, but she was unable to obtain supplies and has not used the machine in 4 or 5 years. She has been more daytime sleepy but usually can control her sleepiness  either by starting to walk around or looking for other stimulation.   Sleep habits are as follows: She usually watches TV for the last hour before retreating to the bedroom, between 11 and midnight. She does watches TV in the bedroom and uses her smart phone. She struggles to fall asleep, but once asleep only sleeps for about 2 or 3 hour intervals fragmented by bathroom breaks. On average 2 bathroom breaks at night. She does not have preferred sleep position, sleeps prone or supine or on the side. She sleeps with 2-3 pillows for support. She sometimes experiences vivid dreams, and she has woken up out of dreams but rarely with palpitations or diaphoresis. She sets her alarm between 5:30 and 6  AM, she uses the snooze function several times before finally rising. She always feels that she needs more sleep and isn't restored and refreshed yet. Her nocturnal sleep time averages about 5 hours. She may take a Sunday afternoon nap but does not nap during week days. Her naps will be power naps of 30 minutes duration.  Brittany Rubio had  been admitted to hospital with pneumonia and septic shock at [facility]. I reviewed the notes from [place] , [place].  Brittany Rubio was hospitalized on 11-26-2016.  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 Diagnosed by Dr. Benjamine Mola.    Sleep medical history and family sleep history:  Mother snored, she's not aware of anybody being diagnosed with sleep apnea, her maternal grandmother suffered from heart disease in the paternal grandmother for breast cancer her mother died of pancreatic cancer she was in her early 38s. Her father died of meningitis - very young in 1979, smoked and drank .  Social history: Brittany Rubio is married, has one adult adopted son, she is a nonsmoker never used tobacco products she very seldomly drinks alcohol, she does use coffee in the mornings about 2 cups and sometimes tea or soda in the afternoons but not daily. She has no shift work history. She works at [employer] in [place], she is an Microbiologist.   Review of Systems: Out of a complete 14 system review, the patient complains of only the following symptoms, and all other reviewed systems are negative. Depression, insomnia, snoring, nocturia and weight gain.   Epworth score 14 , Fatigue severity score 10  , depression score 4/15    Social History   Socioeconomic History  . Marital status: Married    Spouse name: Meryl Crutch  . Number of children: Not on file  . Years of education: Not on file  . Highest education level: Not on file  Occupational History    . Not on file  Social Needs  . Financial resource strain: Not on file  . Food insecurity:    Worry: Not on file    Inability: Not on file  . Transportation needs:    Medical: Not on file    Non-medical: Not on file  Tobacco Use  . Smoking status: Never Smoker  . Smokeless tobacco: Never Used  Substance and Sexual Activity  . Alcohol use: Yes    Comment: 07/07/2014 "a few times/year; weddings, anniversary, etc"  . Drug use: No  . Sexual activity: Yes  Lifestyle  . Physical activity:    Days per week: Not on file    Minutes per session: Not on file  . Stress: Not on file  Relationships  . Social connections:    Talks on phone: Not on file    Gets together: Not on file    Attends religious service: Not on file    Active member of club or organization: Not on file    Attends meetings of clubs or organizations: Not on file    Relationship status: Not on file  . Intimate partner violence:    Fear of current or ex partner: Not on file    Emotionally abused: Not on file    Physically abused: Not on file    Forced sexual activity: Not on file  Other Topics Concern  . Not on file  Social History Narrative  . Not on file    Family History  Problem Relation Age of Onset  . Cancer Mother        Pancreatic  . Diabetes Mother   . Hypertension Mother   . Depression Mother   . Hypertension Father   . Alcoholism Father     Past Medical History:  Diagnosis Date  . ADD (attention deficit disorder)   . Anemia   . Anxiety   . Arthritis    "left shoulder" (07/07/2014)  . Back pain   . Breast cancer ([place])    "left"  . Depression   . Fatty liver   . Food allergy    shellfish  . GERD (gastroesophageal reflux disease)    "takes over the counters as needed"  . Hernia, umbilical   . High cholesterol   . Hypertension 11/26/2011   sees Dr. Bryon Lions  . Hypothyroidism   . Joint pain   . Migraines    "maybe once q 3 months" (07/07/2014)  . Multinodular thyroid 2015  .  OSA on CPAP   . Personal history of radiation therapy   . Pneumonia    May 2018  . Thyroid cancer ([place])    2015  . Type II diabetes mellitus ([place])   . Vitamin D deficiency     Past Surgical History:  Procedure Laterality Date  . ABDOMINAL HYSTERECTOMY  ? 1997  . BREAST EXCISIONAL BIOPSY Right 2014  . BREAST LUMPECTOMY Left 2009  . BREAST LUMPECTOMY Right 07/2012   "not a mastectomy"  . BREAST REDUCTION SURGERY Bilateral   . DIAPHRAGM SURGERY  1986   "trauma"  . INCISIONAL HERNIA REPAIR N/A 06/05/2018   Procedure: LAPAROSCOPIC INCISIONAL HERNIA ERAS PATHWAY;  Surgeon: Erroll Luna, MD;  Location: [facility];  Service: General;  Laterality: N/A;  . INSERTION OF MESH N/A 06/05/2018   Procedure: INSERTION OF MESH;  Surgeon: Erroll Luna, MD;  Location: [place] [facility];  Service: General;  Laterality: N/A;  . PARTIAL MASTECTOMY WITH NEEDLE LOCALIZATION  08/12/2012   Procedure: PARTIAL MASTECTOMY WITH NEEDLE LOCALIZATION;  Surgeon: Joyice Faster. Cornett, MD;  Location: [facility];  Service: General;  Laterality: Right;  . REDUCTION MAMMAPLASTY Bilateral 1995  . THYROIDECTOMY Left 07/07/2014   Procedure: LEFT HEMI-THYROIDECTOMY;  Surgeon: Ascencion Dike, MD;  Location: [facility];  Service: ENT;  Laterality: Left;  . THYROIDECTOMY Right 07/08/2014   Procedure: Completion THYROIDECTOMY;  Surgeon: Ascencion Dike, MD;  Location: [facility];  Service: ENT;  Laterality: Right;  . THYROIDECTOMY, PARTIAL Left 07/07/2014   hemi    Current Outpatient Medications  Medication Sig Dispense Refill  . amphetamine-dextroamphetamine (ADDERALL XR) 20 MG 24 hr capsule Take 20 mg by mouth daily. Take with 30 mg to equal 50 mg daily    . amphetamine-dextroamphetamine (ADDERALL XR)  30 MG 24 hr capsule Take 30 mg by mouth daily. Take with 20 mg to equal 50 mg daily    . aspirin EC 81 MG tablet Take 81 mg by mouth daily.    . butorphanol (STADOL) 10 MG/ML nasal spray Place 1 spray into the nose 3 (three) times daily as needed for migraine.      . cholecalciferol (VITAMIN D3) 25 MCG (1000 UT) tablet Take 1,000 Units by mouth daily.    . Cyanocobalamin (VITAMIN B-12 PO) Take 1 tablet by mouth once a week.     . diazepam (VALIUM) 5 MG tablet Take 1 tablet (5 mg total) by mouth daily as needed for anxiety. 30 tablet 0  . diltiazem (MATZIM LA) 240 MG 24 hr tablet Take 240 mg by mouth at bedtime.     Marland Kitchen EPINEPHrine (EPIPEN 2-PAK) 0.3 mg/0.3 mL IJ SOAJ injection 0.3 mLs by Subdermal route once as needed. Allergic reaction    . esomeprazole (NEXIUM) 20 MG capsule Take 20 mg by mouth daily as needed (acid reflux).     Marland Kitchen FARXIGA 10 MG TABS tablet TAKE 1 TABLET BY MOUTH EVERY DAY 30 tablet 5  . fexofenadine (ALLEGRA) 180 MG tablet Take 180 mg by mouth daily as needed for allergies or rhinitis.    Marland Kitchen ibuprofen (ADVIL,MOTRIN) 800 MG tablet Take 1 tablet (800 mg total) by mouth every 8 (eight) hours as needed. 30 tablet 0  . JANUMET 50-1000 MG tablet TAKE 1 TABLET BY MOUTH 2 TIMES A DAY WITH MEALS 180 tablet 1  . levothyroxine (SYNTHROID, LEVOTHROID) 100 MCG tablet Take 100 mcg by mouth daily before breakfast. Take with 112 mcg to equal 212 mcg daily    . levothyroxine (SYNTHROID, LEVOTHROID) 112 MCG tablet Take 112 mcg by mouth every morning. Take with 100 mcg to equal 212 mcg daily    . lisinopril-hydrochlorothiazide (PRINZIDE,ZESTORETIC) 20-25 MG tablet Take 1 tablet by mouth daily.     . Magnesium 500 MG CAPS Take 500 mg by mouth daily.     . polyethylene glycol powder (GLYCOLAX) powder Take 17 g by mouth daily. 255 g 1  . promethazine (PHENERGAN) 25 MG tablet Take 25 mg by mouth every 8 (eight) hours as needed for nausea or vomiting.   0  . ranitidine (ZANTAC) 150 MG tablet Take 150-300 mg by mouth See admin instructions. Take 300 mg in the morning and may take an additional 150 mg at night as needed for heartburn    . traZODone (DESYREL) 50 MG tablet TAKE 1 TABLET AT BEDTIME AFTER A MEAL 30 tablet 3  . Turmeric Curcumin 500 MG CAPS Take 500 mg by  mouth daily.     Marland Kitchen VIIBRYD 40 MG TABS TAKE 1 TABLET BY MOUTH EVERY DAY WITH FOOD 30 tablet 4  . vitamin C (ASCORBIC ACID) 500 MG tablet Take 500 mg by mouth daily.     No current facility-administered medications for this visit.    DIAGNOSIS Obstructive Sleep Apnea was responsive to CPAP at 12 cm water pressure without EPR. The patient was fitted with a ResMed AirFit P10 (small size) nasal pillow. Hypoxemia did not completely resolve until the final pressure of CPAP was reached.   PLANS/RECOMMENDATIONS: a. CPAP at 12 cm water pressure without EPR. The patient was fitted with a ResMed AirFit P10 (small size) nasal pillow. b. CPAP clinic follow up in 2-3 months after receiving device; and thereafter, yearly CPAP clinic follow-up is advisable. c. I will consider an overnight  pulse-oximetry on CPAP if the patient remains excessively daytime sleepy.  02-05-2017 Larey Seat, MD   Allergies as of 08/07/2018 - Review Complete 08/07/2018  Allergen Reaction Noted  . Crab [shellfish allergy] Hives and Swelling 08/05/2012    Vitals: BP (!) 152/92   Pulse 100   Ht 5\' 5"  (1.651 m)   Wt 225 lb (102.1 kg)   BMI 37.44 kg/m  Last Weight:  Wt Readings from Last 1 Encounters:  08/07/18 225 lb (102.1 kg)   UMP:NTIR mass index is 37.44 kg/m.     Last Height:   Ht Readings from Last 1 Encounters:  08/07/18 5\' 5"  (1.651 m)    Physical exam:  General: The patient is awake, alert and appears not in acute distress. The patient is well groomed. Head: Normocephalic, atraumatic. Neck is supple. Mallampati 4 neck circumference:17. Nasal airflow patent . Retrognathia is seen.  Cardiovascular:   HTN - 152/ 92 mmhg. Regular rate and rhythm- without  carotid bruit, Respiratory: Lungs are clear to auscultation. RR 13 /min.  Skin:  Without evidence of edema, or rash Trunk: BMI is 37.44. The patient's posture is erect   Neurologic exam : The patient is awake and alert, oriented to place and time.     Attention span & concentration ability appears normal. She has problems multitasking  Speech is fluent, without dysarthria, dysphonia or aphasia.  Mood and affect are appropriate.  Cranial nerves: Pupils are equal and briskly reactive to light. Extraocular movements  in vertical and horizontal planes intact and without nystagmus. Visual fields by finger perimetry are intact. Hearing to finger rub intact.   Facial sensation intact to fine touch.  Facial motor strength is symmetric and tongue and uvula move midline. Shoulder shrug was symmetrical.   Motor exam:   Normal tone, muscle bulk and symmetric strength in all extremities. Sensory:  Fine touch, pinprick and vibration were normal. Coordination: Rapid alternating movements in the fingers/hands was normal. Finger-to-nose maneuver  normal without evidence of ataxia, dysmetria or tremor. Gait and station: Patient walks without assistive device Turns with 3 Steps.  Deep tendon reflexes: in the  upper and lower extremities are symmetric and intact.    Assessment:  After physical and neurologic examination, review of laboratory studies,  Personal review of imaging studies, reports of other /same  Imaging studies, results of polysomnography and / or neurophysiology testing and pre-existing records as far as provided in visit., my assessment is   1)  Mrs. Aaron Edelman has OSA -  She has experience using CPAP for about 5 years.  She sleeps better with CPAP - but has trouble with her mask.   2) sleep hygiene. Mrs. Aaron Edelman at this time is considered to be in correlative sleep deprivation as she usually gets 5-5-1/2 hours of nocturnal sleep.  she should try to advance at bedtime well prior to midnight between 10 and 11, and she may try melatonin at a dose of 5 mg or less to help her develop a sustained and continuously sleep pattern.  3) morbid obesity -BMI from 40 to 37  , needs low carb diet and exercise regimen. She has had problems with immune strength,  Pneumonia.     The patient was advised of the nature of the diagnosed disorder , the treatment options and the  risks for general health and wellness arising from not treating the condition.   I spent more than 25 minutes of face to face time with the patient.  Greater than 50%  of time was spent in counseling and coordination of care. We have discussed the diagnosis and differential and I answered the patient's questions.    Plan:  Treatment plan and additional workup :  Continue CPAP sue ,set at 12 cm water with 3 cm EPR. New headgear needed, the patient reports a leak where the hose meets the mask-  Leaking air.   New supplies through [employer].     Larey Seat, MD 7/82/4235, 36:14 AM  Certified in Neurology by ABPN Certified in [place] by [facility] [address] [place], [place] [id number]

## 2018-08-07 NOTE — Progress Notes (Signed)
Office: [phone]  /  Fax: [phone]   HPI:   Chief Complaint: OBESITY Brittany Rubio is here to discuss her progress with her obesity treatment plan. She is on the Category 2 plan + 100 calories and is following her eating plan approximately 40 % of the time. She states she is exercising 0 minutes 0 times per week. Brittany Rubio's last visit before Halloween and has had a lot going on with work and health. She sis well with minimizing weight gain, but is ready to get back on track now.  Her weight is 221 lb (100.2 kg) today and has gained 4 pounds since her last visit. She has lost 0 lbs since starting treatment with [place].  Hypertension Brittany Rubio is a 58 y.o. female with hypertension. Avonne's blood pressure is elevated today. She states she has had a bad day at work, and blood pressure is likely elevated due to stress. She denies chest pain or headaches. She is working weight loss to help control her blood pressure with the goal of decreasing her risk of heart attack and stroke. Larya's blood pressure is not currently controlled.  ASSESSMENT AND PLAN:  Essential hypertension  Class 2 severe obesity with serious comorbidity and body mass index (BMI) of 36.0 to 36.9 in adult, unspecified obesity type ([place])  PLAN:  Hypertension We discussed sodium restriction, working on healthy weight loss, and a regular exercise program as the means to achieve improved blood pressure control. Jovee agreed with this plan and agreed to follow up as directed. We will continue to monitor her blood pressure as well as her progress with the above lifestyle modifications. She will continue her medications as is and get back to diet and exercise. She will watch for signs of hypotension as she continues her lifestyle modifications. Evana agrees to follow up with our clinic in 2 to 3 weeks and we will recheck blood pressure at that time.  I spent > than 50% of the 25 minute visit on counseling as  documented in the note.  Obesity Buna is currently in the action stage of change. As such, her goal is to continue with weight loss efforts She has agreed to follow the Category 2 plan or follow the Pescatarian eating plan Nakema has been instructed to work up to a goal of 150 minutes of combined cardio and strengthening exercise per week for weight loss and overall health benefits. We discussed the following Behavioral Modification Strategies today: increasing lean protein intake, decreasing simple carbohydrates, decrease eating out and work on meal planning and easy cooking plans   Akeyla has agreed to follow up with our clinic in 2 to 3 weeks. She was informed of the importance of frequent follow up visits to maximize her success with intensive lifestyle modifications for her multiple health conditions.  ALLERGIES: Allergies  Allergen Reactions  . Crab [Shellfish Allergy] Hives and Swelling    "Only when I eat too many crab legs" Can tolerate CT scans with contrast-does not need premeds      MEDICATIONS: Current Outpatient Medications on File Prior to Visit  Medication Sig Dispense Refill  . amphetamine-dextroamphetamine (ADDERALL XR) 20 MG 24 hr capsule Take 20 mg by mouth daily. Take with 30 mg to equal 50 mg daily    . amphetamine-dextroamphetamine (ADDERALL XR) 30 MG 24 hr capsule Take 30 mg by mouth daily. Take with 20 mg to equal 50 mg daily    . aspirin EC 81 MG tablet Take 81 mg by mouth  daily.    . butorphanol (STADOL) 10 MG/ML nasal spray Place 1 spray into the nose 3 (three) times daily as needed for migraine.    . cholecalciferol (VITAMIN D3) 25 MCG (1000 UT) tablet Take 1,000 Units by mouth daily.    . Cyanocobalamin (VITAMIN B-12 PO) Take 1 tablet by mouth once a week.     . diazepam (VALIUM) 5 MG tablet Take 1 tablet (5 mg total) by mouth daily as needed for anxiety. 30 tablet 0  . diltiazem (MATZIM LA) 240 MG 24 hr tablet Take 240 mg by mouth at bedtime.       Marland Kitchen EPINEPHrine (EPIPEN 2-PAK) 0.3 mg/0.3 mL IJ SOAJ injection 0.3 mLs by Subdermal route once as needed. Allergic reaction    . esomeprazole (NEXIUM) 20 MG capsule Take 20 mg by mouth daily as needed (acid reflux).     Marland Kitchen FARXIGA 10 MG TABS tablet TAKE 1 TABLET BY MOUTH EVERY DAY 30 tablet 5  . fexofenadine (ALLEGRA) 180 MG tablet Take 180 mg by mouth daily as needed for allergies or rhinitis.    Marland Kitchen ibuprofen (ADVIL,MOTRIN) 800 MG tablet Take 1 tablet (800 mg total) by mouth every 8 (eight) hours as needed. 30 tablet 0  . JANUMET 50-1000 MG tablet TAKE 1 TABLET BY MOUTH 2 TIMES A DAY WITH MEALS 180 tablet 1  . levothyroxine (SYNTHROID, LEVOTHROID) 100 MCG tablet Take 100 mcg by mouth daily before breakfast. Take with 112 mcg to equal 212 mcg daily    . levothyroxine (SYNTHROID, LEVOTHROID) 112 MCG tablet Take 112 mcg by mouth every morning. Take with 100 mcg to equal 212 mcg daily    . lisinopril-hydrochlorothiazide (PRINZIDE,ZESTORETIC) 20-25 MG tablet Take 1 tablet by mouth daily.     . Magnesium 500 MG CAPS Take 500 mg by mouth daily.     . polyethylene glycol powder (GLYCOLAX) powder Take 17 g by mouth daily. 255 g 1  . promethazine (PHENERGAN) 25 MG tablet Take 25 mg by mouth every 8 (eight) hours as needed for nausea or vomiting.   0  . ranitidine (ZANTAC) 150 MG tablet Take 150-300 mg by mouth See admin instructions. Take 300 mg in the morning and may take an additional 150 mg at night as needed for heartburn    . traZODone (DESYREL) 50 MG tablet TAKE 1 TABLET AT BEDTIME AFTER A MEAL 30 tablet 3  . Turmeric Curcumin 500 MG CAPS Take 500 mg by mouth daily.     Marland Kitchen VIIBRYD 40 MG TABS TAKE 1 TABLET BY MOUTH EVERY DAY WITH FOOD 30 tablet 4  . vitamin C (ASCORBIC ACID) 500 MG tablet Take 500 mg by mouth daily.     No current facility-administered medications on file prior to visit.     PAST MEDICAL HISTORY: Past Medical History:  Diagnosis Date  . ADD (attention deficit disorder)   . Anemia    . Anxiety   . Arthritis    "left shoulder" (07/07/2014)  . Back pain   . Breast cancer ([place])    "left"  . Depression   . Fatty liver   . Food allergy    shellfish  . GERD (gastroesophageal reflux disease)    "takes over the counters as needed"  . Hernia, umbilical   . High cholesterol   . Hypertension 11/26/2011   sees Dr. Bryon Lions  . Hypothyroidism   . Joint pain   . Migraines    "maybe once q 3 months" (07/07/2014)  .  Multinodular thyroid 2015  . OSA on CPAP   . Personal history of radiation therapy   . Pneumonia    May 2018  . Thyroid cancer ([place])    2015  . Type II diabetes mellitus ([place])   . Vitamin D deficiency     PAST SURGICAL HISTORY: Past Surgical History:  Procedure Laterality Date  . ABDOMINAL HYSTERECTOMY  ? 1997  . BREAST EXCISIONAL BIOPSY Right 2014  . BREAST LUMPECTOMY Left 2009  . BREAST LUMPECTOMY Right 07/2012   "not a mastectomy"  . BREAST REDUCTION SURGERY Bilateral   . DIAPHRAGM SURGERY  1986   "trauma"  . INCISIONAL HERNIA REPAIR N/A 06/05/2018   Procedure: LAPAROSCOPIC INCISIONAL HERNIA ERAS PATHWAY;  Surgeon: Erroll Luna, MD;  Location: [facility];  Service: General;  Laterality: N/A;  . INSERTION OF MESH N/A 06/05/2018   Procedure: INSERTION OF MESH;  Surgeon: Erroll Luna, MD;  Location: [facility];  Service: General;  Laterality: N/A;  . PARTIAL MASTECTOMY WITH NEEDLE LOCALIZATION  08/12/2012   Procedure: PARTIAL MASTECTOMY WITH NEEDLE LOCALIZATION;  Surgeon: Joyice Faster. Cornett, MD;  Location: [facility];  Service: General;  Laterality: Right;  . REDUCTION MAMMAPLASTY Bilateral 1995  . THYROIDECTOMY Left 07/07/2014   Procedure: LEFT HEMI-THYROIDECTOMY;  Surgeon: Ascencion Dike, MD;  Location: [facility];  Service: ENT;  Laterality: Left;  . THYROIDECTOMY Right 07/08/2014   Procedure: Completion THYROIDECTOMY;  Surgeon: Ascencion Dike, MD;  Location: [facility];  Service: ENT;  Laterality: Right;  . THYROIDECTOMY, PARTIAL Left 07/07/2014   hemi    SOCIAL  HISTORY: Social History   Tobacco Use  . Smoking status: Never Smoker  . Smokeless tobacco: Never Used  Substance Use Topics  . Alcohol use: Yes    Comment: 07/07/2014 "a few times/year; weddings, anniversary, etc"  . Drug use: No    FAMILY HISTORY: Family History  Problem Relation Age of Onset  . Cancer Mother        Pancreatic  . Diabetes Mother   . Hypertension Mother   . Depression Mother   . Hypertension Father   . Alcoholism Father     ROS: Review of Systems  Constitutional: Negative for weight loss.  Cardiovascular: Negative for chest pain.  Neurological: Negative for headaches.    PHYSICAL EXAM: Blood pressure (!) 161/91, pulse 65, temperature 98.1 F (36.7 C), temperature source Oral, height 5\' 5"  (1.651 m), weight 221 lb (100.2 kg), SpO2 98 %. Body mass index is 36.78 kg/m. Physical Exam Vitals signs reviewed.  Constitutional:      Appearance: Normal appearance. She is obese.  Cardiovascular:     Rate and Rhythm: Normal rate.     Pulses: Normal pulses.  Pulmonary:     Effort: Pulmonary effort is normal.     Breath sounds: Normal breath sounds.  Musculoskeletal: Normal range of motion.  Skin:    General: Skin is warm and dry.  Neurological:     Mental Status: She is alert and oriented to person, place, and time.  Psychiatric:        Mood and Affect: Mood normal.        Behavior: Behavior normal.     RECENT LABS AND TESTS: BMET    Component Value Date/Time   NA 140 05/29/2018 0839   NA 141 04/17/2018 1038   NA 143 05/28/2013 0844   K 4.4 05/29/2018 0839   K 4.4 05/28/2013 0844   CL 103 05/29/2018 0839   CL 99 11/20/2012 0940  CO2 25 05/29/2018 0839   CO2 28 05/28/2013 0844   GLUCOSE 226 (H) 05/29/2018 0839   GLUCOSE 94 05/28/2013 0844   GLUCOSE 142 (H) 11/20/2012 0940   BUN 19 05/29/2018 0839   BUN 16 04/17/2018 1038   BUN 16.9 05/28/2013 0844   CREATININE 1.30 (H) 05/29/2018 0839   CREATININE 0.8 05/28/2013 0844   CALCIUM 9.1  05/29/2018 0839   CALCIUM 9.8 05/28/2013 0844   GFRNONAA 44 (L) 05/29/2018 0839   GFRAA 51 (L) 05/29/2018 0839   Lab Results  Component Value Date   HGBA1C 8.4 (H) 04/17/2018   HGBA1C 9.2 (A) 01/08/2018   Lab Results  Component Value Date   INSULIN 16.8 04/17/2018   CBC    Component Value Date/Time   WBC 7.6 05/29/2018 0839   RBC 5.60 (H) 05/29/2018 0839   HGB 12.3 05/29/2018 0839   HGB 12.2 04/17/2018 1038   HGB 12.9 05/28/2013 0844   HCT 42.5 05/29/2018 0839   HCT 38.5 04/17/2018 1038   HCT 40.8 05/28/2013 0844   PLT 281 05/29/2018 0839   PLT 317 05/28/2013 0844   MCV 75.9 (L) 05/29/2018 0839   MCV 73 (L) 04/17/2018 1038   MCV 72.1 (L) 05/28/2013 0844   MCH 22.0 (L) 05/29/2018 0839   MCHC 28.9 (L) 05/29/2018 0839   RDW 18.3 (H) 05/29/2018 0839   RDW 16.0 (H) 04/17/2018 1038   RDW 17.6 (H) 05/28/2013 0844   LYMPHSABS 1.3 05/29/2018 0839   LYMPHSABS 1.4 04/17/2018 1038   LYMPHSABS 1.7 05/28/2013 0844   MONOABS 0.8 05/29/2018 0839   MONOABS 0.8 05/28/2013 0844   EOSABS 0.4 05/29/2018 0839   EOSABS 0.2 04/17/2018 1038   BASOSABS 0.1 05/29/2018 0839   BASOSABS 0.1 04/17/2018 1038   BASOSABS 0.1 05/28/2013 0844   Iron/TIBC/Ferritin/ %Sat    Component Value Date/Time   FERRITIN 25 07/05/2011 0844   Lipid Panel     Component Value Date/Time   CHOL 202 (H) 04/17/2018 1038   TRIG 122 04/17/2018 1038   HDL 50 04/17/2018 1038   LDLCALC 128 (H) 04/17/2018 1038   Hepatic Function Panel     Component Value Date/Time   PROT 7.2 05/29/2018 0839   PROT 7.4 04/17/2018 1038   PROT 7.9 05/28/2013 0844   ALBUMIN 3.4 (L) 05/29/2018 0839   ALBUMIN 4.3 04/17/2018 1038   ALBUMIN 3.7 05/28/2013 0844   AST 25 05/29/2018 0839   AST 15 05/28/2013 0844   ALT 31 05/29/2018 0839   ALT 23 05/28/2013 0844   ALKPHOS 46 05/29/2018 0839   ALKPHOS 58 05/28/2013 0844   BILITOT 0.3 05/29/2018 0839   BILITOT <0.2 04/17/2018 1038   BILITOT 0.28 05/28/2013 0844      Component  Value Date/Time   TSH 0.449 (L) 04/17/2018 1038      OBESITY BEHAVIORAL INTERVENTION VISIT  Today's visit was # 3   Starting weight: 221 lbs Starting date: 04/17/18 Today's weight : 225 lbs  Today's date: 08/06/2018 Total lbs lost to date: 0    ASK: We discussed the diagnosis of obesity with Larina Bras today and [place] agreed to give [place] permission to discuss obesity behavioral modification therapy today.  ASSESS: Lasheba has the diagnosis of obesity and her BMI today is 37.44 Jamilah is in the action stage of change   ADVISE: Janisse was educated on the multiple health risks of obesity as well as the benefit of weight loss to improve her health. She was advised of the need  for long term treatment and the importance of lifestyle modifications to improve her current health and to decrease her risk of future health problems.  AGREE: Multiple dietary modification options and treatment options were discussed and  Stormie agreed to follow the recommendations documented in the above note.  ARRANGE: Kalissa was educated on the importance of frequent visits to treat obesity as outlined per CMS and USPSTF guidelines and agreed to schedule her next follow up appointment today.  I, Trixie Dredge, am acting as transcriptionist for Dennard Nip, MD  I have reviewed the above documentation for accuracy and completeness, and I agree with the above. -Dennard Nip, MD

## 2018-08-08 LAB — BMP8+EGFR
BUN/Creatinine Ratio: 20 (ref 9–23)
BUN: 17 mg/dL (ref 6–24)
CO2: 25 mmol/L (ref 20–29)
Calcium: 9.4 mg/dL (ref 8.7–10.2)
Chloride: 95 mmol/L — ABNORMAL LOW (ref 96–106)
Creatinine, Ser: 0.87 mg/dL (ref 0.57–1.00)
GFR calc Af Amer: 85 mL/min/{1.73_m2} (ref >59)
GFR calc non Af Amer: 74 mL/min/{1.73_m2} (ref >59)
Glucose: 114 mg/dL — ABNORMAL HIGH (ref 65–99)
Potassium: 4.5 mmol/L (ref 3.5–5.2)
Sodium: 141 mmol/L (ref 134–144)

## 2018-08-08 LAB — T3, FREE: T3, Free: 2.4 pg/mL (ref 2.0–4.4)

## 2018-08-08 LAB — HEMOGLOBIN A1C
Est. average glucose Bld gHb Est-mCnc: 200 mg/dL
Hgb A1c MFr Bld: 8.6 % — ABNORMAL HIGH (ref 4.8–5.6)

## 2018-08-08 LAB — TSH: TSH: 1.62 u[IU]/mL (ref 0.450–4.500)

## 2018-08-08 LAB — T4, FREE: Free T4: 2.02 ng/dL — ABNORMAL HIGH (ref 0.82–1.77)

## 2018-08-08 NOTE — Progress Notes (Signed)
Subjective:     Patient ID: Brittany Rubio , female    DOB: [birth year] , 58 y.o.   MRN: [id number]   Chief Complaint  Patient presents with  . Diabetes  . Hypertension    HPI  Diabetes  She presents for her follow-up diabetic visit. She has type 2 diabetes mellitus. Her disease course has been stable. There are no hypoglycemic associated symptoms. Pertinent negatives for diabetes include no blurred vision and no chest pain. There are no hypoglycemic complications. Diabetic complications include nephropathy. Risk factors for coronary artery disease include diabetes mellitus, dyslipidemia, hypertension, obesity, sedentary lifestyle, post-menopausal and stress. Her weight is fluctuating minimally. She is following a diabetic diet.  Hypertension  This is a chronic problem. The current episode started more than 1 year ago. The problem has been gradually improving since onset. The problem is uncontrolled. Pertinent negatives include no blurred vision, chest pain, palpitations or shortness of breath.   She reports compliance with meds. States she rushed here today from work.   Past Medical History:  Diagnosis Date  . ADD (attention deficit disorder)   . Anemia   . Anxiety   . Arthritis    "left shoulder" (07/07/2014)  . Back pain   . Breast cancer ([place])    "left"  . Depression   . Fatty liver   . Food allergy    shellfish  . GERD (gastroesophageal reflux disease)    "takes over the counters as needed"  . Hernia, umbilical   . High cholesterol   . Hypertension 11/26/2011   sees Dr. Bryon Lions  . Hypothyroidism   . Joint pain   . Migraines    "maybe once q 3 months" (07/07/2014)  . Multinodular thyroid 2015  . OSA on CPAP   . Personal history of radiation therapy   . Pneumonia    May 2018  . Thyroid cancer ([place])    2015  . Type II diabetes mellitus ([place])   . Vitamin D deficiency      Family History  Problem Relation Age of Onset  . Cancer Mother        Pancreatic   . Diabetes Mother   . Hypertension Mother   . Depression Mother   . Hypertension Father   . Alcoholism Father      Current Outpatient Medications:  .  amphetamine-dextroamphetamine (ADDERALL XR) 20 MG 24 hr capsule, Take 20 mg by mouth daily. Take with 30 mg to equal 50 mg daily, Disp: , Rfl:  .  amphetamine-dextroamphetamine (ADDERALL XR) 30 MG 24 hr capsule, Take 30 mg by mouth daily. Take with 20 mg to equal 50 mg daily, Disp: , Rfl:  .  aspirin EC 81 MG tablet, Take 81 mg by mouth daily., Disp: , Rfl:  .  butorphanol (STADOL) 10 MG/ML nasal spray, Place 1 spray into the nose 3 (three) times daily as needed for migraine., Disp: , Rfl:  .  cholecalciferol (VITAMIN D3) 25 MCG (1000 UT) tablet, Take 1,000 Units by mouth daily., Disp: , Rfl:  .  JANUMET 50-1000 MG tablet, TAKE 1 TABLET BY MOUTH 2 TIMES A DAY WITH MEALS, Disp: 180 tablet, Rfl: 1 .  levothyroxine (SYNTHROID, LEVOTHROID) 100 MCG tablet, Take 100 mcg by mouth daily before breakfast. Take with 112 mcg to equal 212 mcg daily, Disp: , Rfl:  .  levothyroxine (SYNTHROID, LEVOTHROID) 112 MCG tablet, Take 112 mcg by mouth every morning. Take with 100 mcg to equal 212 mcg daily, Disp: ,  Rfl:  .  Cyanocobalamin (VITAMIN B-12 PO), Take 1 tablet by mouth once a week. , Disp: , Rfl:  .  diazepam (VALIUM) 5 MG tablet, Take 1 tablet (5 mg total) by mouth daily as needed for anxiety., Disp: 30 tablet, Rfl: 0 .  diltiazem (MATZIM LA) 240 MG 24 hr tablet, Take 240 mg by mouth at bedtime. , Disp: , Rfl:  .  EPINEPHrine (EPIPEN 2-PAK) 0.3 mg/0.3 mL IJ SOAJ injection, 0.3 mLs by Subdermal route once as needed. Allergic reaction, Disp: , Rfl:  .  esomeprazole (NEXIUM) 20 MG capsule, Take 20 mg by mouth daily as needed (acid reflux). , Disp: , Rfl:  .  FARXIGA 10 MG TABS tablet, TAKE 1 TABLET BY MOUTH EVERY DAY, Disp: 30 tablet, Rfl: 5 .  fexofenadine (ALLEGRA) 180 MG tablet, Take 180 mg by mouth daily as needed for allergies or rhinitis., Disp: ,  Rfl:  .  ibuprofen (ADVIL,MOTRIN) 800 MG tablet, Take 1 tablet (800 mg total) by mouth every 8 (eight) hours as needed., Disp: 30 tablet, Rfl: 0 .  lisinopril-hydrochlorothiazide (PRINZIDE,ZESTORETIC) 20-25 MG tablet, Take 1 tablet by mouth daily. , Disp: , Rfl:  .  Magnesium 500 MG CAPS, Take 500 mg by mouth daily. , Disp: , Rfl:  .  polyethylene glycol powder (GLYCOLAX) powder, Take 17 g by mouth daily., Disp: 255 g, Rfl: 1 .  promethazine (PHENERGAN) 25 MG tablet, Take 25 mg by mouth every 8 (eight) hours as needed for nausea or vomiting. , Disp: , Rfl: 0 .  ranitidine (ZANTAC) 150 MG tablet, Take 150-300 mg by mouth See admin instructions. Take 300 mg in the morning and may take an additional 150 mg at night as needed for heartburn, Disp: , Rfl:  .  traZODone (DESYREL) 50 MG tablet, TAKE 1 TABLET AT BEDTIME AFTER A MEAL, Disp: 30 tablet, Rfl: 3 .  Turmeric Curcumin 500 MG CAPS, Take 500 mg by mouth daily. , Disp: , Rfl:  .  VIIBRYD 40 MG TABS, TAKE 1 TABLET BY MOUTH EVERY DAY WITH FOOD, Disp: 30 tablet, Rfl: 4 .  vitamin C (ASCORBIC ACID) 500 MG tablet, Take 500 mg by mouth daily., Disp: , Rfl:    Allergies  Allergen Reactions  . Crab [Shellfish Allergy] Hives and Swelling    "Only when I eat too many crab legs" Can tolerate CT scans with contrast-does not need premeds       Review of Systems  Constitutional: Negative.   Eyes: Negative for blurred vision.  Respiratory: Negative.  Negative for shortness of breath.   Cardiovascular: Negative.  Negative for chest pain and palpitations.  Gastrointestinal: Negative.   Neurological: Negative.   Psychiatric/Behavioral: Negative.      Today's Vitals   08/07/18 1151  BP: (!) 146/90  Pulse: 100  Temp: 98.3 F (36.8 C)  TempSrc: Oral  Weight: 224 lb 9.6 oz (101.9 kg)  Height: 5' 5"  (1.651 m)   Body mass index is 37.38 kg/m.   Objective:  Physical Exam Vitals signs and nursing note reviewed.  Constitutional:      Appearance:  Normal appearance. She is obese.  HENT:     Head: Normocephalic and atraumatic.  Cardiovascular:     Rate and Rhythm: Normal rate and regular rhythm.     Heart sounds: Normal heart sounds.  Pulmonary:     Effort: Pulmonary effort is normal.     Breath sounds: Normal breath sounds.  Skin:    General: Skin is warm.  Neurological:     General: No focal deficit present.     Mental Status: She is alert.  Psychiatric:        Mood and Affect: Mood normal.         Assessment And Plan:     1. Type 2 diabetes mellitus with stage 2 chronic kidney disease, without long-term current use of insulin ([place])  I will check labs as listed below.  Importance of dietary, exercise and medication compliance was discussed with the patient.   - BMP8+EGFR - Hemoglobin A1c  2. Hypertensive nephropathy  Fair control. She is encouraged to avoid adding salt to her foods. IF BP is elevated at next visit, I plan to adjust her medications.   3. OSA on CPAP  Chronic. Importance of CPAP compliance was discussed with the patient. She is encouraged to wear CPAP at least four hours per night.  4. Postsurgical hypothyroidism  I will check thyroid panel and adjust meds as needed.   - TSH - T4, Free - T3, free  5. Class 2 severe obesity due to excess calories with serious comorbidity and body mass index (BMI) of 37.0 to 37.9 in adult [facility]) \ She is encouraged to strive for BMI less than 30 to decrease cardiac risk. She is advised to incorporate more exercise into her daily routine. She was congratulated for going to [facility]. She is also encouraged to avoid sugary beverages, which will help her achieve optimal blood sugars.   [name], MD

## 2018-08-15 ENCOUNTER — Other Ambulatory Visit: Payer: Self-pay | Admitting: Internal Medicine

## 2018-08-19 ENCOUNTER — Encounter (HOSPITAL_COMMUNITY): Payer: Self-pay

## 2018-08-19 ENCOUNTER — Emergency Department (HOSPITAL_COMMUNITY): Payer: No Typology Code available for payment source

## 2018-08-19 ENCOUNTER — Emergency Department (HOSPITAL_COMMUNITY)
Admission: EM | Admit: 2018-08-19 | Discharge: 2018-08-19 | Disposition: A | Discharge status: Home / Self Care | Payer: No Typology Code available for payment source | Attending: Emergency Medicine | Admitting: Emergency Medicine

## 2018-08-19 ENCOUNTER — Other Ambulatory Visit: Payer: Self-pay

## 2018-08-19 DIAGNOSIS — Z7982 Long term (current) use of aspirin: Secondary | ICD-10-CM | POA: Diagnosis not present

## 2018-08-19 DIAGNOSIS — F909 Attention-deficit hyperactivity disorder, unspecified type: Secondary | ICD-10-CM | POA: Insufficient documentation

## 2018-08-19 DIAGNOSIS — Y990 Civilian activity done for income or pay: Secondary | ICD-10-CM | POA: Insufficient documentation

## 2018-08-19 DIAGNOSIS — S20211A Contusion of right front wall of thorax, initial encounter: Secondary | ICD-10-CM | POA: Diagnosis not present

## 2018-08-19 DIAGNOSIS — E119 Type 2 diabetes mellitus without complications: Secondary | ICD-10-CM | POA: Diagnosis not present

## 2018-08-19 DIAGNOSIS — W01198A Fall on same level from slipping, tripping and stumbling with subsequent striking against other object, initial encounter: Secondary | ICD-10-CM | POA: Diagnosis not present

## 2018-08-19 DIAGNOSIS — E039 Hypothyroidism, unspecified: Secondary | ICD-10-CM | POA: Diagnosis not present

## 2018-08-19 DIAGNOSIS — Y92219 Unspecified school as the place of occurrence of the external cause: Secondary | ICD-10-CM | POA: Insufficient documentation

## 2018-08-19 DIAGNOSIS — I10 Essential (primary) hypertension: Secondary | ICD-10-CM | POA: Diagnosis not present

## 2018-08-19 DIAGNOSIS — Y9301 Activity, walking, marching and hiking: Secondary | ICD-10-CM | POA: Diagnosis not present

## 2018-08-19 DIAGNOSIS — Z79899 Other long term (current) drug therapy: Secondary | ICD-10-CM | POA: Insufficient documentation

## 2018-08-19 DIAGNOSIS — S161XXA Strain of muscle, fascia and tendon at neck level, initial encounter: Secondary | ICD-10-CM

## 2018-08-19 DIAGNOSIS — S7002XA Contusion of left hip, initial encounter: Secondary | ICD-10-CM | POA: Diagnosis not present

## 2018-08-19 DIAGNOSIS — S298XXA Other specified injuries of thorax, initial encounter: Secondary | ICD-10-CM

## 2018-08-19 MED ORDER — HYDROCODONE-ACETAMINOPHEN 5-325 MG PO TABS
2.0000 | ORAL_TABLET | Freq: Once | ORAL | Status: AC
  Administered 2018-08-19: 2 via ORAL
  Filled 2018-08-19: qty 2

## 2018-08-19 MED ORDER — NAPROXEN 500 MG PO TABS: 500.0000 mg | ORAL_TABLET | Freq: Two times a day (BID) | ORAL | 0 refills | Status: DC

## 2018-08-19 MED ORDER — METHOCARBAMOL 500 MG PO TABS: 500.0000 mg | ORAL_TABLET | Freq: Two times a day (BID) | ORAL | 0 refills | Status: DC | PRN

## 2018-08-19 NOTE — ED Triage Notes (Signed)
Pt here for c/o right shoulder/right ribs/right hip/right buttocks/ right knee after slipping at work due to wet floors ; pt denies ever hitting her head or denies LOC; no deformities noted

## 2018-08-19 NOTE — Discharge Instructions (Signed)
Your x-rays are normal and showed no signs of broken bones or significant arthritis.  Please take the medications exactly as prescribed, Robaxin as a muscle relaxer that can be taken twice a day as needed for muscle spasms and tightness, naproxen is an anti-inflammatory that should be taken twice a day, please try to avoid taking anything stronger than that if possible as the opiate medications have been linked with constipation allergic reactions and addiction.  Please seek medical exam if no better within 1 week.  I would encourage you to return to activity within 48 hours to minimize the chance of secondary injuries such as worsening back pain or muscle strains.  You may find some additional relief with ice packs to your neck or back.

## 2018-08-19 NOTE — ED Provider Notes (Signed)
[facility] Provider Note   CSN: [id number] Arrival date & time: 08/19/18  1305     History   Chief Complaint Chief Complaint  Patient presents with  . Fall    HPI Brittany Rubio is a 58 y.o. female.  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  This happened a short time ago, since that time she has had pain in her upper back across her shoulders as well as in her right ribs and down her right thigh and hip.  She was able to get to her feet with some assistance and was assisted down the hallway ambulating with some assistance.  At this time the pain is persistent, worse with palpation and movement, she does not have pain with deep breathing nor does she have a headache and has no complaints of numbness or weakness.  No medications given prior to arrival.  Past Medical History:  Diagnosis Date  . ADD (attention deficit disorder)   . Anemia   . Anxiety   . Arthritis    "left shoulder" (07/07/2014)  . Back pain   . Breast cancer ([place])    "left"  . Depression   . Fatty liver   . Food allergy    shellfish  . GERD (gastroesophageal reflux disease)    "takes over the counters as needed"  . Hernia, umbilical   . High cholesterol   . Hypertension 11/26/2011   sees Dr. Bryon Lions  . Hypothyroidism   . Joint pain   . Migraines    "maybe once q 3 months" (07/07/2014)  . Multinodular thyroid 2015  . OSA on CPAP   . Personal history of radiation therapy   . Pneumonia    May 2018  . Thyroid cancer ([place])    2015  . Type II diabetes mellitus ([place])   .  Vitamin D deficiency     Patient Active Problem List   Diagnosis Date Noted  . Incisional hernia without obstruction or gangrene 06/05/2018  . Nephropathy 05/05/2018  . Pneumonia and influenza 01/28/2017  . Cancer of thyroid gland ([place]) 01/28/2017  . Severe obstructive sleep apnea-hypopnea syndrome 01/28/2017  . Comorbid sleep-related hypoventilation 01/28/2017  . Community acquired pneumonia of right upper lobe of lung ([place])   . Sepsis due to Streptococcus pneumoniae ([place])   . AKI (acute kidney injury) ([place]) 11/26/2016  . Hyperlipidemia 11/26/2016  . GERD (gastroesophageal reflux disease) 11/26/2016  . Lobar pneumonia ([place])   . Diabetes mellitus with complication ([place])   . Acute respiratory failure with hypoxia ([place])   . Septic shock ([place])   . S/P partial thyroidectomy 07/07/2014  . 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 Hypertension 11/26/2011  . Migraines 11/26/2011  . Breast cancer ([place]) 07/05/2011    Past Surgical History:  Procedure Laterality Date  . ABDOMINAL HYSTERECTOMY  ? 1997  . BREAST EXCISIONAL BIOPSY Right 2014  . BREAST LUMPECTOMY Left 2009  . BREAST LUMPECTOMY Right 07/2012   "not a mastectomy"  . BREAST REDUCTION SURGERY Bilateral   . DIAPHRAGM  SURGERY  1986   "trauma"  . INCISIONAL HERNIA REPAIR N/A 06/05/2018   Procedure: LAPAROSCOPIC INCISIONAL HERNIA ERAS PATHWAY;  Surgeon: Erroll Luna, MD;  Location: [place];  Service: General;  Laterality: N/A;  . INSERTION OF MESH N/A 06/05/2018   Procedure: INSERTION OF MESH;  Surgeon: Erroll Luna, MD;  Location: [facility];  Service: General;  Laterality: N/A;  . PARTIAL MASTECTOMY WITH NEEDLE LOCALIZATION  08/12/2012   Procedure: PARTIAL MASTECTOMY WITH NEEDLE LOCALIZATION;  Surgeon: Joyice Faster. Cornett, MD;  Location: [facility];  Service: General;  Laterality: Right;  . REDUCTION MAMMAPLASTY Bilateral 1995  . THYROIDECTOMY Left 07/07/2014   Procedure: LEFT HEMI-THYROIDECTOMY;  Surgeon: Ascencion Dike, MD;   Location: [facility];  Service: ENT;  Laterality: Left;  . THYROIDECTOMY Right 07/08/2014   Procedure: Completion THYROIDECTOMY;  Surgeon: Ascencion Dike, MD;  Location: [facility];  Service: ENT;  Laterality: Right;  . THYROIDECTOMY, PARTIAL Left 07/07/2014   hemi     OB History    Gravida  0   Para  0   Term  0   Preterm  0   AB  0   Living  0     SAB  0   TAB  0   Ectopic  0   Multiple  0   Live Births  0            Home Medications    Prior to Admission medications   Medication Sig Start Date End Date Taking? Authorizing Provider  amphetamine-dextroamphetamine (ADDERALL XR) 20 MG 24 hr capsule Take 20 mg by mouth daily. Take with 30 mg to equal 50 mg daily    [provider]  amphetamine-dextroamphetamine (ADDERALL XR) 30 MG 24 hr capsule Take 30 mg by mouth daily. Take with 20 mg to equal 50 mg daily    [provider]  aspirin EC 81 MG tablet Take 81 mg by mouth daily.    [provider]  butorphanol (STADOL) 10 MG/ML nasal spray Place 1 spray into the nose 3 (three) times daily as needed for migraine. 04/28/18   [provider]  cholecalciferol (VITAMIN D3) 25 MCG (1000 UT) tablet Take 1,000 Units by mouth daily.    [provider]  Cyanocobalamin (VITAMIN B-12 PO) Take 1 tablet by mouth once a week.     [provider]  diazepam (VALIUM) 5 MG tablet Take 1 tablet (5 mg total) by mouth daily as needed for anxiety. 06/04/18   Glendale Chard, MD  diltiazem (MATZIM LA) 240 MG 24 hr tablet Take 240 mg by mouth at bedtime.     Glendale Chard, MD  diltiazem [facility]) 240 MG 24 hr capsule TAKE 1 TABLET BY MOUTH AT BEDTIME FOR BP 08/15/18   Glendale Chard, MD  EPINEPHrine (EPIPEN 2-PAK) 0.3 mg/0.3 mL IJ SOAJ injection 0.3 mLs by Subdermal route once as needed. Allergic reaction 12/07/14   [provider]  esomeprazole (NEXIUM) 20 MG capsule Take 20 mg by mouth daily as needed (acid reflux).     [provider]    FARXIGA 10 MG TABS tablet TAKE 1 TABLET BY MOUTH EVERY DAY 04/16/18   Glendale Chard, MD  fexofenadine (ALLEGRA) 180 MG tablet Take 180 mg by mouth daily as needed for allergies or rhinitis.    [provider]  ibuprofen (ADVIL,MOTRIN) 800 MG tablet Take 1 tablet (800 mg total) by mouth every 8 (eight) hours as needed. 06/06/18   Erroll Luna, MD  Carlyn Reichert  50-1000 MG tablet TAKE 1 TABLET BY MOUTH 2 TIMES A DAY WITH MEALS 05/27/18   Glendale Chard, MD  levothyroxine (SYNTHROID, LEVOTHROID) 100 MCG tablet Take 100 mcg by mouth daily before breakfast. Take with 112 mcg to equal 212 mcg daily    [provider]  levothyroxine (SYNTHROID, LEVOTHROID) 112 MCG tablet Take 112 mcg by mouth every morning. Take with 100 mcg to equal 212 mcg daily    [provider]  lisinopril-hydrochlorothiazide (PRINZIDE,ZESTORETIC) 20-25 MG tablet Take 1 tablet by mouth daily.     [provider]  Magnesium 500 MG CAPS Take 500 mg by mouth daily.     [provider]  methocarbamol (ROBAXIN) 500 MG tablet Take 1 tablet (500 mg total) by mouth 2 (two) times daily as needed for muscle spasms. 08/19/18   Noemi Chapel, MD  naproxen (NAPROSYN) 500 MG tablet Take 1 tablet (500 mg total) by mouth 2 (two) times daily with a meal. 08/19/18   Noemi Chapel, MD  polyethylene glycol powder (GLYCOLAX) powder Take 17 g by mouth daily. 06/06/18 06/06/19  Erroll Luna, MD  promethazine (PHENERGAN) 25 MG tablet Take 25 mg by mouth every 8 (eight) hours as needed for nausea or vomiting.  03/13/17   [provider]  ranitidine (ZANTAC) 150 MG tablet Take 150-300 mg by mouth See admin instructions. Take 300 mg in the morning and may take an additional 150 mg at night as needed for heartburn    [provider]  traZODone (DESYREL) 50 MG tablet TAKE 1 TABLET AT BEDTIME AFTER A MEAL 06/27/18   Glendale Chard, MD  Turmeric Curcumin 500 MG CAPS Take 500 mg by mouth daily.     [provider]  VIIBRYD 40 MG TABS TAKE 1 TABLET BY MOUTH EVERY DAY WITH FOOD 07/23/18   Glendale Chard, MD  vitamin C (ASCORBIC ACID) 500 MG tablet Take 500 mg by mouth daily.    [provider]    Family History Family History  Problem Relation Age of Onset  . Cancer Mother        Pancreatic  . Diabetes Mother   . Hypertension Mother   . Depression Mother   . Hypertension Father   . Alcoholism Father     Social History Social History   Tobacco Use  . Smoking status: Never Smoker  . Smokeless tobacco: Never Used  Substance Use Topics  . Alcohol use: Yes    Comment: 07/07/2014 "a few times/year; weddings, anniversary, etc"  . Drug use: No     Allergies   Crab [shellfish allergy]   Review of Systems Review of Systems  All other systems reviewed and are negative.    Physical Exam Updated Vital Signs BP (!) 150/79   Pulse 84   Temp 98.5 F (36.9 C) (Oral)   Resp 18   Ht 1.651 m (5\' 5" )   Wt 102.1 kg   SpO2 99%   BMI 37.44 kg/m   Physical Exam Vitals signs and nursing note reviewed.  Constitutional:      General: She is not in acute distress.    Appearance: She is well-developed.     Comments: Uncomfortable appearing  HENT:     Head: Normocephalic and atraumatic.     Mouth/Throat:     Pharynx: No oropharyngeal exudate.  Eyes:     General: No scleral icterus.       Right eye: No discharge.        Left eye: No  discharge.     Conjunctiva/sclera: Conjunctivae normal.     Pupils: Pupils are equal, round, and reactive to light.  Neck:     Musculoskeletal: Normal range of motion and neck supple.     Thyroid: No thyromegaly.     Vascular: No JVD.  Cardiovascular:     Rate and Rhythm: Normal rate and regular rhythm.     Heart sounds: Normal heart sounds. No murmur. No friction rub. No gallop.   Pulmonary:     Effort: Pulmonary effort is normal. No respiratory distress.     Breath sounds: Normal breath sounds. No wheezing or rales.  Abdominal:       General: Bowel sounds are normal. There is no distension.     Palpations: Abdomen is soft. There is no mass.     Tenderness: There is no abdominal tenderness.  Musculoskeletal: Normal range of motion.        General: Tenderness present.     Comments: There is ttp over the R ribs lateral and anteriuor - no crepitance or sub Q emphysema - breathing not limited by pain.  Has ttp over the R hip and lateral thight but able to flex at hip.  Has good ROM of the R hip with passive ROM, difficulty with active ROM.    Lymphadenopathy:     Cervical: No cervical adenopathy.  Skin:    General: Skin is warm and dry.     Findings: No erythema or rash.  Neurological:     Mental Status: She is alert.     Coordination: Coordination normal.     Comments: Normal level of consciousness, normal speech, normal coordination, she is able to flex at the hips bilaterally though with some pain on the right.  She has normal sensation diffusely to all 4 extremities.  Normal grips.  Psychiatric:        Behavior: Behavior normal.      ED Treatments / Results  Labs (all labs ordered are listed, but only abnormal results are displayed) Labs Reviewed - No data to display  EKG None  Radiology Dg Ribs Unilateral W/chest Right  Result Date: 08/19/2018 CLINICAL DATA:  Right rib pain after fall at work today. EXAM: RIGHT RIBS AND CHEST - 3+ VIEW COMPARISON:  Radiographs of Dec 11, 2017. FINDINGS: No fracture or other bone lesions are seen involving the ribs. There is no evidence of pneumothorax or pleural effusion. Both lungs are clear. Heart size and mediastinal contours are within normal limits. IMPRESSION: Negative. Electronically Signed   By: Marijo Conception, M.D.   On: 08/19/2018 14:53   Dg Hip Unilat W Or Wo Pelvis 2-3 Views Right  Result Date: 08/19/2018 CLINICAL DATA:  Right hip pain after fall at work. EXAM: DG HIP (WITH OR WITHOUT PELVIS) 2-3V RIGHT COMPARISON:  None. FINDINGS: There is no evidence of hip  fracture or dislocation. There is no evidence of arthropathy or other focal bone abnormality. IMPRESSION: Negative. Electronically Signed   By: Marijo Conception, M.D.   On: 08/19/2018 14:51    Procedures Procedures (including critical care time)  Medications Ordered in ED Medications  HYDROcodone-acetaminophen (NORCO/VICODIN) 5-325 MG per tablet 2 tablet (2 tablets Oral Given 08/19/18 1402)     Initial Impression / Assessment and Plan / ED Course  I have reviewed the triage vital signs and the nursing notes.  Pertinent labs & imaging results that were available during my care of the patient were reviewed by me and considered in my  medical decision making (see chart for details).     After having a fall in the hallway I suspect that the patient has some muscular injuries however will obtain x-rays of the hip and pelvis to rule out fractures of this area as well as the right ribs.  I suspect that the tenderness that she has across her upper shoulders and back is related to more of a muscular cause as there was no direct injury here.  There is no head injury.  I have offered her pain medications which she has accepted.  Anticipate discharge after imaging.  The imaging is negative for acute findings including the hip and pelvis as well as the ribs.  The patient was informed of these findings, she has been given hydrocodone here, she has tramadol at home, stable for discharge on an anti-inflammatory and Robaxin.  Final Clinical Impressions(s) / ED Diagnoses   Final diagnoses:  Contusion of left hip, initial encounter  Contusion of rib on right side, initial encounter  Cervical strain, acute, initial encounter    ED Discharge Orders         Ordered    methocarbamol (ROBAXIN) 500 MG tablet  2 times daily PRN     08/19/18 1511    naproxen (NAPROSYN) 500 MG tablet  2 times daily with meals     08/19/18 1511           Noemi Chapel, MD 08/19/18 1512

## 2018-08-27 ENCOUNTER — Encounter (INDEPENDENT_AMBULATORY_CARE_PROVIDER_SITE_OTHER): Payer: Self-pay

## 2018-08-27 ENCOUNTER — Ambulatory Visit (INDEPENDENT_AMBULATORY_CARE_PROVIDER_SITE_OTHER): Payer: BC Managed Care – PPO | Admitting: Family Medicine

## 2018-08-29 ENCOUNTER — Other Ambulatory Visit: Payer: Self-pay | Admitting: Internal Medicine

## 2018-08-29 DIAGNOSIS — R921 Mammographic calcification found on diagnostic imaging of breast: Secondary | ICD-10-CM

## 2018-08-29 MED ORDER — BUTORPHANOL TARTRATE 10 MG/ML NA SOLN: 1.0000 | Freq: Three times a day (TID) | NASAL | 0 refills | Status: DC | PRN

## 2018-09-08 ENCOUNTER — Other Ambulatory Visit: Payer: Self-pay | Admitting: Internal Medicine

## 2018-09-08 DIAGNOSIS — C50919 Malignant neoplasm of unspecified site of unspecified female breast: Secondary | ICD-10-CM

## 2018-09-09 ENCOUNTER — Other Ambulatory Visit: Payer: Self-pay | Admitting: Internal Medicine

## 2018-09-09 NOTE — Telephone Encounter (Signed)
Trazodone refill 

## 2018-10-02 ENCOUNTER — Other Ambulatory Visit: Payer: Self-pay | Admitting: Internal Medicine

## 2018-10-02 NOTE — Telephone Encounter (Signed)
Butorphanol refill

## 2018-10-08 ENCOUNTER — Other Ambulatory Visit: Payer: Self-pay | Admitting: Internal Medicine

## 2018-10-08 ENCOUNTER — Other Ambulatory Visit: Payer: Self-pay

## 2018-10-08 ENCOUNTER — Ambulatory Visit
Admission: RE | Admit: 2018-10-08 | Discharge: 2018-10-08 | Disposition: A | Discharge status: Home / Self Care | Payer: BC Managed Care – PPO | Source: Ambulatory Visit | Attending: Internal Medicine | Admitting: Internal Medicine

## 2018-10-08 DIAGNOSIS — R921 Mammographic calcification found on diagnostic imaging of breast: Secondary | ICD-10-CM

## 2018-10-08 NOTE — Telephone Encounter (Signed)
Diazepam refill 

## 2018-10-10 ENCOUNTER — Other Ambulatory Visit: Payer: Self-pay | Admitting: Internal Medicine

## 2018-10-10 ENCOUNTER — Other Ambulatory Visit: Payer: Self-pay

## 2018-10-10 ENCOUNTER — Ambulatory Visit
Admission: RE | Admit: 2018-10-10 | Discharge: 2018-10-10 | Disposition: A | Discharge status: Home / Self Care | Payer: BC Managed Care – PPO | Source: Ambulatory Visit | Attending: Internal Medicine | Admitting: Internal Medicine

## 2018-10-10 DIAGNOSIS — R921 Mammographic calcification found on diagnostic imaging of breast: Secondary | ICD-10-CM

## 2018-10-14 ENCOUNTER — Other Ambulatory Visit: Payer: Self-pay | Admitting: Internal Medicine

## 2018-10-21 ENCOUNTER — Other Ambulatory Visit: Payer: Self-pay | Admitting: Endocrinology

## 2018-10-21 DIAGNOSIS — M810 Age-related osteoporosis without current pathological fracture: Secondary | ICD-10-CM

## 2018-11-06 ENCOUNTER — Ambulatory Visit: Payer: BC Managed Care – PPO | Admitting: Internal Medicine

## 2018-11-11 ENCOUNTER — Telehealth: Payer: Self-pay

## 2018-11-11 ENCOUNTER — Ambulatory Visit: Payer: BC Managed Care – PPO | Admitting: Internal Medicine

## 2018-11-11 ENCOUNTER — Other Ambulatory Visit: Payer: Self-pay

## 2018-11-11 ENCOUNTER — Encounter: Payer: Self-pay | Admitting: Internal Medicine

## 2018-11-11 VITALS — Temp 97.3°F

## 2018-11-11 DIAGNOSIS — E1122 Type 2 diabetes mellitus with diabetic chronic kidney disease: Secondary | ICD-10-CM | POA: Diagnosis not present

## 2018-11-11 DIAGNOSIS — F900 Attention-deficit hyperactivity disorder, predominantly inattentive type: Secondary | ICD-10-CM

## 2018-11-11 DIAGNOSIS — N182 Chronic kidney disease, stage 2 (mild): Secondary | ICD-10-CM

## 2018-11-11 DIAGNOSIS — G43809 Other migraine, not intractable, without status migrainosus: Secondary | ICD-10-CM | POA: Diagnosis not present

## 2018-11-11 DIAGNOSIS — Z6837 Body mass index (BMI) 37.0-37.9, adult: Secondary | ICD-10-CM

## 2018-11-11 DIAGNOSIS — F5101 Primary insomnia: Secondary | ICD-10-CM

## 2018-11-11 DIAGNOSIS — I129 Hypertensive chronic kidney disease with stage 1 through stage 4 chronic kidney disease, or unspecified chronic kidney disease: Secondary | ICD-10-CM

## 2018-11-11 MED ORDER — AMPHETAMINE-DEXTROAMPHET ER 20 MG PO CP24: 20.0000 mg | ORAL_CAPSULE | Freq: Every day | ORAL | 0 refills | Status: DC

## 2018-11-11 MED ORDER — BUTORPHANOL TARTRATE 10 MG/ML NA SOLN: 1.0000 | Freq: Three times a day (TID) | NASAL | 1 refills | Status: DC | PRN

## 2018-11-11 MED ORDER — TRAZODONE HCL 50 MG PO TABS: ORAL_TABLET | ORAL | 2 refills | Status: DC

## 2018-11-11 NOTE — Progress Notes (Addendum)
Virtual Visit via Video   This visit type was conducted due to national recommendations for restrictions regarding the COVID-19 Pandemic (e.g. social distancing) in an effort to limit this patient's exposure and mitigate transmission in our community.  Due to her co-morbid illnesses, this patient is at least at moderate risk for complications without adequate follow up.  This format is felt to be most appropriate for this patient at this time.  All issues noted in this document were discussed and addressed.  A limited physical exam was performed with this format.    This visit type was conducted due to national recommendations for restrictions regarding the COVID-19 Pandemic (e.g. social distancing) in an effort to limit this patient's exposure and mitigate transmission in our community.  Patients identity confirmed using two different identifiers.  This format is felt to be most appropriate for this patient at this time.  All issues noted in this document were discussed and addressed.  No physical exam was performed (except for noted visual exam findings with Video Visits).    Date:  11/11/2018   ID:  Brittany Rubio, DOB 1961/02/10, MRN [id number]  Patient Location:  Home  Provider location:   Office    Chief Complaint:  Diabetes check  History of Present Illness:    Brittany Rubio is a 58 y.o. female who presents via video conferencing for a telehealth visit today.    The patient does not have symptoms concerning for COVID-19 infection (fever, chills, cough, or new shortness of breath).   She presents today for virtual visit. She prefers this mode of visit due to COVID-19 pandemic. She is anxious about coming in contact with someone who has the virus.   Diabetes  She presents for her follow-up diabetic visit. She has type 2 diabetes mellitus. Her disease course has been stable. Hypoglycemia symptoms include headaches (she has h/o migraines. needs refill of Stadol NS). Pertinent  negatives for diabetes include no blurred vision and no chest pain. There are no hypoglycemic complications. Diabetic complications include nephropathy. Risk factors for coronary artery disease include diabetes mellitus, dyslipidemia, hypertension, obesity, sedentary lifestyle, post-menopausal and stress. Her weight is fluctuating minimally. She is following a diabetic diet. She participates in exercise intermittently. An ACE inhibitor/angiotensin II receptor blocker is being taken.  Hypertension  This is a chronic problem. The current episode started more than 1 year ago. The problem has been gradually improving since onset. The problem is uncontrolled. Associated symptoms include headaches (she has h/o migraines. needs refill of Stadol NS). Pertinent negatives include no blurred vision, chest pain, palpitations or shortness of breath. Hypertensive end-organ damage includes kidney disease.     Past Medical History:  Diagnosis Date  . ADD (attention deficit disorder)   . Anemia   . Anxiety   . Arthritis    "left shoulder" (07/07/2014)  . Back pain   . Breast cancer ([place])    "left"  . Depression   . Fatty liver   . Food allergy    shellfish  . GERD (gastroesophageal reflux disease)    "takes over the counters as needed"  . Hernia, umbilical   . High cholesterol   . Hypertension 11/26/2011   sees Dr. Bryon Lions  . Hypothyroidism   . Joint pain   . Migraines    "maybe once q 3 months" (07/07/2014)  . Multinodular thyroid 2015  . OSA on CPAP   . Personal history of radiation therapy   . Pneumonia  May 2018  . Thyroid cancer ([place])    2015  . Type II diabetes mellitus ([place])   . Vitamin D deficiency    Past Surgical History:  Procedure Laterality Date  . ABDOMINAL HYSTERECTOMY  ? 1997  . BREAST EXCISIONAL BIOPSY Right 2014  . BREAST LUMPECTOMY Left 2009  . BREAST LUMPECTOMY Right 07/2012   "not a mastectomy"  . BREAST REDUCTION SURGERY Bilateral   . DIAPHRAGM SURGERY  1986    "trauma"  . INCISIONAL HERNIA REPAIR N/A 06/05/2018   Procedure: LAPAROSCOPIC INCISIONAL HERNIA ERAS PATHWAY;  Surgeon: Erroll Luna, MD;  Location: [facility];  Service: General;  Laterality: N/A;  . INSERTION OF MESH N/A 06/05/2018   Procedure: INSERTION OF MESH;  Surgeon: Erroll Luna, MD;  Location: [facility];  Service: General;  Laterality: N/A;  . PARTIAL MASTECTOMY WITH NEEDLE LOCALIZATION  08/12/2012   Procedure: PARTIAL MASTECTOMY WITH NEEDLE LOCALIZATION;  Surgeon: Joyice Faster. Cornett, MD;  Location: [facility];  Service: General;  Laterality: Right;  . REDUCTION MAMMAPLASTY Bilateral 1995  . THYROIDECTOMY Left 07/07/2014   Procedure: LEFT HEMI-THYROIDECTOMY;  Surgeon: Ascencion Dike, MD;  Location: [facility];  Service: ENT;  Laterality: Left;  . THYROIDECTOMY Right 07/08/2014   Procedure: Completion THYROIDECTOMY;  Surgeon: Ascencion Dike, MD;  Location: [facility];  Service: ENT;  Laterality: Right;  . THYROIDECTOMY, PARTIAL Left 07/07/2014   hemi     Current Meds  Medication Sig  . amphetamine-dextroamphetamine (ADDERALL XR) 20 MG 24 hr capsule Take 1 capsule (20 mg total) by mouth daily. Take with 30 mg to equal 50 mg daily  . amphetamine-dextroamphetamine (ADDERALL XR) 30 MG 24 hr capsule Take 30 mg by mouth daily. Take with 20 mg to equal 50 mg daily  . aspirin EC 81 MG tablet Take 81 mg by mouth daily.  . butorphanol (STADOL) 10 MG/ML nasal spray Place 1 spray into the nose 3 (three) times daily as needed for migraine.  . cholecalciferol (VITAMIN D3) 25 MCG (1000 UT) tablet Take 1,000 Units by mouth daily.  . Cyanocobalamin (VITAMIN B-12 PO) Take 1 tablet by mouth once a week.   . diazepam (VALIUM) 5 MG tablet TAKE 1 TABLET (5 MG TOTAL) BY MOUTH DAILY AS NEEDED FOR ANXIETY.  Marland Kitchen diltiazem (TIAZAC) 240 MG 24 hr capsule TAKE 1 TABLET BY MOUTH AT BEDTIME FOR BP  . EPINEPHrine (EPIPEN 2-PAK) 0.3 mg/0.3 mL IJ SOAJ injection 0.3 mLs by Subdermal route once as needed. Allergic reaction  . esomeprazole  (NEXIUM) 20 MG capsule Take 20 mg by mouth daily as needed (acid reflux).   Marland Kitchen FARXIGA 10 MG TABS tablet TAKE 1 TABLET BY MOUTH EVERY DAY  . fexofenadine (ALLEGRA) 180 MG tablet Take 180 mg by mouth daily as needed for allergies or rhinitis.  Marland Kitchen JANUMET 50-1000 MG tablet TAKE 1 TABLET BY MOUTH 2 TIMES A DAY WITH MEALS  . levothyroxine (SYNTHROID, LEVOTHROID) 112 MCG tablet Take 112 mcg by mouth every morning. Take 2 tabs daily  . lisinopril-hydrochlorothiazide (PRINZIDE,ZESTORETIC) 20-25 MG tablet TAKE 1 TABLET BY MOUTH EVERY DAY  . Magnesium 500 MG CAPS Take 500 mg by mouth daily.   . promethazine (PHENERGAN) 25 MG tablet Take 25 mg by mouth every 8 (eight) hours as needed for nausea or vomiting.   . ranitidine (ZANTAC) 150 MG tablet Take 150-300 mg by mouth See admin instructions. Take 300 mg in the morning and may take an additional 150 mg at night as needed for heartburn  .  traZODone (DESYREL) 50 MG tablet Take 2 tabs po qhs  . Turmeric Curcumin 500 MG CAPS Take 500 mg by mouth daily.   Marland Kitchen VIIBRYD 40 MG TABS TAKE 1 TABLET BY MOUTH EVERY DAY WITH FOOD  . vitamin C (ASCORBIC ACID) 500 MG tablet Take 500 mg by mouth daily.  . [DISCONTINUED] amphetamine-dextroamphetamine (ADDERALL XR) 20 MG 24 hr capsule Take 20 mg by mouth daily. Take with 30 mg to equal 50 mg daily  . [DISCONTINUED] amphetamine-dextroamphetamine (ADDERALL) 10 MG tablet Take 10 mg by mouth every evening.  . [DISCONTINUED] butorphanol (STADOL) 10 MG/ML nasal spray PLACE 1 SPRAY INTO THE NOSE 3 (THREE) TIMES DAILY AS NEEDED FOR MIGRAINE.  . [DISCONTINUED] traZODone (DESYREL) 50 MG tablet TAKE 1 TABLET AT BEDTIME AFTER A MEAL (Patient taking differently: 50 mg. Take 2 tabs at bedtime)     Allergies:   Otho Darner allergy]   Social History   Tobacco Use  . Smoking status: Never Smoker  . Smokeless tobacco: Never Used  Substance Use Topics  . Alcohol use: Yes    Comment: 07/07/2014 "a few times/year; weddings, anniversary,  etc"  . Drug use: No     Family Hx: The patient's family history includes Alcoholism in her father; Cancer in her mother; Depression in her mother; Diabetes in her mother; Hypertension in her father and mother.  ROS:   Please see the history of present illness.    Review of Systems  Constitutional: Negative.   Eyes: Negative for blurred vision.  Respiratory: Negative.  Negative for shortness of breath.   Cardiovascular: Negative.  Negative for chest pain and palpitations.  Gastrointestinal: Negative.   Neurological: Positive for headaches (she has h/o migraines. needs refill of Stadol NS).  Psychiatric/Behavioral: The patient has insomnia.     All other systems reviewed and are negative.   Labs/Other Tests and Data Reviewed:    Recent Labs: 05/29/2018: ALT 31; Hemoglobin 12.3; Platelets 281 08/07/2018: BUN 17; Creatinine, Ser 0.87; Potassium 4.5; Sodium 141; TSH 1.620   Recent Lipid Panel Lab Results  Component Value Date/Time   CHOL 202 (H) 04/17/2018 10:38 AM   TRIG 122 04/17/2018 10:38 AM   HDL 50 04/17/2018 10:38 AM   LDLCALC 128 (H) 04/17/2018 10:38 AM    Wt Readings from Last 3 Encounters:  08/19/18 225 lb (102.1 kg)  08/07/18 224 lb 9.6 oz (101.9 kg)  08/07/18 225 lb (102.1 kg)     Exam:    Vital Signs:  Temp (!) 97.3 F (36.3 C) (Oral)     Physical Exam  Constitutional: She is oriented to person, place, and time and well-developed, well-nourished, and in no distress.  HENT:  Head: Normocephalic and atraumatic.  Pulmonary/Chest: Effort normal.  Neurological: She is alert and oriented to person, place, and time.  Psychiatric: Affect normal.  Nursing note and vitals reviewed.   ASSESSMENT & PLAN:     1. Type 2 diabetes mellitus with stage 2 chronic kidney disease, without long-term current use of insulin ([place])  She agrees to come in for labwork; however, she is anxious about coming INSIDE the building. She agrees to come in IF phlebotomist will come  outside to draw her blood. She is encouraged to incorporate more exercise into her daily routine.   2. Hypertensive nephropathy  Chronic. She will continue with current meds. She is scheduled for nurse visit on Th. She is encouraged to avoid adding salt to her foods.   3. Other migraine without status migrainosus, not  intractable  Chronic, intermittent. She was given refill of Stadol NS to use prn. She is also encouraged to take magnesium nightly. This may help to decrease frequency and severity of her migraines.   4. Attention deficit hyperactivity disorder (ADHD), predominantly inattentive type  Chronic. She is followed by Dr. Johnnye Sima. Her dose has been increased - will update her chart accordingly.   5. Primary insomnia  Chronic. She was given refill of trazodone 32m - 2 tabs to use nightly. She is encouraged to adopt good bedtime hygiene and to develop a bedtime routine.   6. Class 2 severe obesity due to excess calories with serious comorbidity and body mass index (BMI) of 37.0 to 37.9 in adult ([facility]  Importance of achieving optimal weight to decrease risk of cardiovascular disease and cancers was discussed with the patient in full detail. She is encouraged to start slowly - start with 10 minutes twice daily at least three to four days per week and to gradually build to 30 minutes five days weekly. She was given tips to incorporate more activity into her daily routine - take stairs when possible, park farther away from her job, grocery stores, etc.      COVID-19 Education: The signs and symptoms of COVID-19 were discussed with the patient and how to seek care for testing (follow up with PCP or arrange E-visit).  The importance of social distancing was discussed today.  Patient Risk:   After full review of this patients clinical status, I feel that they are at least moderate risk at this time.  Time:   Today, I have spent 18 minutes/ 48 seconds with the patient with telehealth  technology discussing above diagnoses.     Medication Adjustments/Labs and Tests Ordered: Current medicines are reviewed at length with the patient today.  Concerns regarding medicines are outlined above.   Tests Ordered: Orders Placed This Encounter  Procedures  . CMP14+EGFR  . Hemoglobin A1c  . Lipid panel    Medication Changes: Meds ordered this encounter  Medications  . amphetamine-dextroamphetamine (ADDERALL XR) 20 MG 24 hr capsule    Sig: Take 1 capsule (20 mg total) by mouth daily. Take with 30 mg to equal 50 mg daily    Dispense:  30 capsule    Refill:  0  . traZODone (DESYREL) 50 MG tablet    Sig: Take 2 tabs po qhs    Dispense:  180 tablet    Refill:  2  . butorphanol (STADOL) 10 MG/ML nasal spray    Sig: Place 1 spray into the nose 3 (three) times daily as needed for migraine.    Dispense:  2.5 mL    Refill:  1    Not to exceed 5 additional fills before 02/25/2019.    Disposition:  Follow up in 3 month(s)  Signed, RMaximino Greenland MD

## 2018-11-11 NOTE — Telephone Encounter (Signed)
Patient consented to virtual visit. YRL,RMA

## 2018-11-11 NOTE — Patient Instructions (Signed)

## 2018-11-13 ENCOUNTER — Other Ambulatory Visit: Payer: BC Managed Care – PPO

## 2018-11-13 ENCOUNTER — Other Ambulatory Visit: Payer: Self-pay

## 2018-11-13 ENCOUNTER — Ambulatory Visit: Payer: BC Managed Care – PPO

## 2018-11-13 VITALS — BP 118/74 | Ht 65.0 in

## 2018-11-13 DIAGNOSIS — I129 Hypertensive chronic kidney disease with stage 1 through stage 4 chronic kidney disease, or unspecified chronic kidney disease: Secondary | ICD-10-CM

## 2018-11-13 DIAGNOSIS — E1122 Type 2 diabetes mellitus with diabetic chronic kidney disease: Secondary | ICD-10-CM

## 2018-11-13 DIAGNOSIS — N182 Chronic kidney disease, stage 2 (mild): Secondary | ICD-10-CM

## 2018-11-14 LAB — CMP14+EGFR
ALT: 34 IU/L — ABNORMAL HIGH (ref 0–32)
AST: 20 IU/L (ref 0–40)
Albumin/Globulin Ratio: 1.4 (ref 1.2–2.2)
Albumin: 4.4 g/dL (ref 3.8–4.9)
Alkaline Phosphatase: 63 IU/L (ref 39–117)
BUN/Creatinine Ratio: 14 (ref 9–23)
BUN: 12 mg/dL (ref 6–24)
Bilirubin Total: <0.2 mg/dL (ref 0.0–1.2)
CO2: 25 mmol/L (ref 20–29)
Calcium: 9.9 mg/dL (ref 8.7–10.2)
Chloride: 99 mmol/L (ref 96–106)
Creatinine, Ser: 0.83 mg/dL (ref 0.57–1.00)
GFR calc Af Amer: 89 mL/min/{1.73_m2} (ref >59)
GFR calc non Af Amer: 77 mL/min/{1.73_m2} (ref >59)
Globulin, Total: 3.1 g/dL (ref 1.5–4.5)
Glucose: 104 mg/dL — ABNORMAL HIGH (ref 65–99)
Potassium: 4.6 mmol/L (ref 3.5–5.2)
Sodium: 146 mmol/L — ABNORMAL HIGH (ref 134–144)
Total Protein: 7.5 g/dL (ref 6.0–8.5)

## 2018-11-14 LAB — LIPID PANEL
Chol/HDL Ratio: 4.2 ratio (ref 0.0–4.4)
Cholesterol, Total: 208 mg/dL — ABNORMAL HIGH (ref 100–199)
HDL: 49 mg/dL (ref >39)
LDL Calculated: 130 mg/dL — ABNORMAL HIGH (ref 0–99)
Triglycerides: 145 mg/dL (ref 0–149)
VLDL Cholesterol Cal: 29 mg/dL (ref 5–40)

## 2018-11-14 LAB — HEMOGLOBIN A1C
Est. average glucose Bld gHb Est-mCnc: 177 mg/dL
Hgb A1c MFr Bld: 7.8 % — ABNORMAL HIGH (ref 4.8–5.6)

## 2018-11-17 ENCOUNTER — Encounter (INDEPENDENT_AMBULATORY_CARE_PROVIDER_SITE_OTHER): Payer: Self-pay | Admitting: Family Medicine

## 2018-11-17 ENCOUNTER — Ambulatory Visit (INDEPENDENT_AMBULATORY_CARE_PROVIDER_SITE_OTHER): Payer: BC Managed Care – PPO | Admitting: Family Medicine

## 2018-11-17 ENCOUNTER — Other Ambulatory Visit: Payer: Self-pay

## 2018-11-17 DIAGNOSIS — E1129 Type 2 diabetes mellitus with other diabetic kidney complication: Secondary | ICD-10-CM

## 2018-11-17 DIAGNOSIS — R809 Proteinuria, unspecified: Secondary | ICD-10-CM | POA: Diagnosis not present

## 2018-11-17 DIAGNOSIS — E66812 Obesity, class 2: Secondary | ICD-10-CM | POA: Insufficient documentation

## 2018-11-17 DIAGNOSIS — E7849 Other hyperlipidemia: Secondary | ICD-10-CM

## 2018-11-17 DIAGNOSIS — Z6837 Body mass index (BMI) 37.0-37.9, adult: Secondary | ICD-10-CM

## 2018-11-17 MED ORDER — BLOOD GLUCOSE MONITOR KIT: PACK | 0 refills | Status: DC

## 2018-11-17 NOTE — Progress Notes (Signed)
Office: [phone]  /  Fax: [phone] TeleHealth Visit:  Brittany Rubio has verbally consented to this TeleHealth visit today. The patient is located at home, the provider is located at the [facility] office. The participants in this visit include the listed provider and patient. The visit was conducted today via FaceTime.  HPI:   Chief Complaint: OBESITY Brittany Rubio is here to discuss her progress with her obesity treatment plan. She is on the Category 2 plan and is following her eating plan approximately 0% of the time. She states she is exercising 0 minutes 0 times per week. Brittany Rubio's last office visit was 08/06/2018, at which time her weight was 221 lbs. She reports weighing 230 lbs at home last week. She states she's been off the plan for the last few months but wants to start back on the Category 2 plan + 100 calories. We were unable to weigh the patient today for this TeleHealth visit. She states she weighed 230 lbs at home last week. She has lost 0 lbs since starting treatment with [place].  Diabetes Mellitus with Microalbuminuria without Insulin Brittany Rubio has a diagnosis of diabetes type II, not well controlled. Brittany Rubio states she is not checking her CBG's because her glucometer is broken. She is currently taking [place] and Janumet. Last A1c was 7.8 on 11/13/2018, down from 8.6 on 08/07/2018. She has been working on intensive lifestyle modifications including diet, exercise, and weight loss to help control her blood glucose levels.  Hyperlipidemia [name] has hyperlipidemia and has been trying to improve her cholesterol levels with intensive lifestyle modification including a low saturated fat diet, exercise and weight loss. Her LDL is not at goal with her last level being at 130 on 11/13/2018. Triglycerides were within normal limits and HDL was slightly low. She is not on a statin.  The 10-year ASCVD risk score [name] DC Jr., et al., 2013) is: 12.9%   Values used to  calculate the score:     Age: 32 years     Sex: Female     Is Non-Hispanic African American: Yes     Diabetic: Yes     Tobacco smoker: No     Systolic Blood Pressure: 132 mmHg     Is BP treated: Yes     HDL Cholesterol: 49 mg/dL     Total Cholesterol: 208 mg/dL  ASSESSMENT AND PLAN:  Type 2 diabetes mellitus with microalbuminuria, without long-term current use of insulin ([place]) - Plan: blood glucose meter kit and supplies KIT  Other hyperlipidemia  Class 2 severe obesity with serious comorbidity and body mass index (BMI) of 37.0 to 37.9 in adult, unspecified obesity type ([place])  PLAN:  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 We discussed the importance of good blood sugar control to decrease the likelihood of diabetic complications such as nephropathy, neuropathy, limb loss, blindness, coronary artery disease, and death. We discussed the importance of intensive lifestyle modification including diet, exercise and weight loss as the first line treatment for diabetes. Brittany Rubio was given a prescription for a glucometer and for test strips and lancets for BID testing. She will check CBG's BID - a.m. and 2-hour postprandial (after largest meal). She agrees to follow-up with our clinic in 2 weeks.  Hyperlipidemia Manju was informed of the American Heart Association Guidelines emphasizing intensive lifestyle modifications as the first  line treatment for hyperlipidemia. We discussed many lifestyle modifications today in depth, and [name] will continue to work on decreasing saturated fats such as fatty red meat, butter and many fried foods. She will also increase vegetables and lean protein in her diet and continue to work on exercise and weight loss efforts. We will discuss starting on a  statin at her next visit. Her ASCVD risk score is 12.9%.  Obesity Brittany Rubio is currently in the action stage of change. As such, her goal is to continue with weight loss efforts. She has agreed to follow the Category 2 plan + 100 calories. Brittany Rubio has been instructed to walk as tolerated 10 minutes 2 times per day for weight loss and overall health benefits. We discussed the following Behavioral Modification Strategies today: decreasing simple carbohydrates, increase H20 intake, work on meal planning and easy cooking plans, and planning for success.  Brittany Rubio has agreed to follow-up with our clinic in 2 weeks. She was informed of the importance of frequent follow-up visits to maximize her success with intensive lifestyle modifications for her multiple health conditions.  ALLERGIES: Allergies  Allergen Reactions  . Crab [Shellfish Allergy] Hives and Swelling    "Only when I eat too many crab legs" Can tolerate CT scans with contrast-does not need premeds      MEDICATIONS: Current Outpatient Medications on File Prior to Visit  Medication Sig Dispense Refill  . amphetamine-dextroamphetamine (ADDERALL XR) 20 MG 24 hr capsule Take 1 capsule (20 mg total) by mouth daily. Take with 30 mg to equal 50 mg daily 30 capsule 0  . amphetamine-dextroamphetamine (ADDERALL XR) 30 MG 24 hr capsule Take 30 mg by mouth daily. Take with 20 mg to equal 50 mg daily    . aspirin EC 81 MG tablet Take 81 mg by mouth daily.    . butorphanol (STADOL) 10 MG/ML nasal spray Place 1 spray into the nose 3 (three) times daily as needed for migraine. 2.5 mL 1  . cholecalciferol (VITAMIN D3) 25 MCG (1000 UT) tablet Take 1,000 Units by mouth daily.    . Cyanocobalamin (VITAMIN B-12 PO) Take 1 tablet by mouth once a week.     . diazepam (VALIUM) 5 MG tablet TAKE 1 TABLET (5 MG TOTAL) BY MOUTH DAILY AS NEEDED FOR ANXIETY. 30 tablet 0  . diltiazem (TIAZAC) 240 MG 24 hr capsule TAKE 1 TABLET BY MOUTH AT BEDTIME FOR BP 90  capsule 1  . EPINEPHrine (EPIPEN 2-PAK) 0.3 mg/0.3 mL IJ SOAJ injection 0.3 mLs by Subdermal route once as needed. Allergic reaction    . esomeprazole (NEXIUM) 20 MG capsule Take 20 mg by mouth daily as needed (acid reflux).     Marland Kitchen FARXIGA 10 MG TABS tablet TAKE 1 TABLET BY MOUTH EVERY DAY 30 tablet 5  . fexofenadine (ALLEGRA) 180 MG tablet Take 180 mg by mouth daily as needed for allergies or rhinitis.    Marland Kitchen JANUMET 50-1000 MG tablet TAKE 1 TABLET BY MOUTH 2 TIMES A DAY WITH MEALS 180 tablet 1  . levothyroxine (SYNTHROID, LEVOTHROID) 112 MCG tablet Take 112 mcg by mouth every morning. Take 2 tabs daily    . lisinopril-hydrochlorothiazide (PRINZIDE,ZESTORETIC) 20-25 MG tablet TAKE 1 TABLET BY MOUTH EVERY DAY 90 tablet 1  . Magnesium 500 MG CAPS Take 500 mg by mouth daily.     . promethazine (PHENERGAN) 25 MG tablet Take 25 mg by mouth every 8 (eight) hours as needed for nausea or vomiting.   0  . ranitidine (  ZANTAC) 150 MG tablet Take 150-300 mg by mouth See admin instructions. Take 300 mg in the morning and may take an additional 150 mg at night as needed for heartburn    . traZODone (DESYREL) 50 MG tablet Take 2 tabs po qhs 180 tablet 2  . Turmeric Curcumin 500 MG CAPS Take 500 mg by mouth daily.     Marland Kitchen VIIBRYD 40 MG TABS TAKE 1 TABLET BY MOUTH EVERY DAY WITH FOOD 30 tablet 4  . vitamin C (ASCORBIC ACID) 500 MG tablet Take 500 mg by mouth daily.     No current facility-administered medications on file prior to visit.     PAST MEDICAL HISTORY: Past Medical History:  Diagnosis Date  . ADD (attention deficit disorder)   . Anemia   . Anxiety   . Arthritis    "left shoulder" (07/07/2014)  . Back pain   . Breast cancer ([place])    "left"  . Depression   . Fatty liver   . Food allergy    shellfish  . GERD (gastroesophageal reflux disease)    "takes over the counters as needed"  . Hernia, umbilical   . High cholesterol   . Hypertension 11/26/2011   sees Dr. Bryon Lions  . Hypothyroidism    . Joint pain   . Migraines    "maybe once q 3 months" (07/07/2014)  . Multinodular thyroid 2015  . OSA on CPAP   . Personal history of radiation therapy   . Pneumonia    May 2018  . Thyroid cancer ([place])    2015  . Type II diabetes mellitus ([place])   . Vitamin D deficiency     PAST SURGICAL HISTORY: Past Surgical History:  Procedure Laterality Date  . ABDOMINAL HYSTERECTOMY  ? 1997  . BREAST EXCISIONAL BIOPSY Right 2014  . BREAST LUMPECTOMY Left 2009  . BREAST LUMPECTOMY Right 07/2012   "not a mastectomy"  . BREAST REDUCTION SURGERY Bilateral   . DIAPHRAGM SURGERY  1986   "trauma"  . INCISIONAL HERNIA REPAIR N/A 06/05/2018   Procedure: LAPAROSCOPIC INCISIONAL HERNIA ERAS PATHWAY;  Surgeon: Erroll Luna, MD;  Location: [facility];  Service: General;  Laterality: N/A;  . INSERTION OF MESH N/A 06/05/2018   Procedure: INSERTION OF MESH;  Surgeon: Erroll Luna, MD;  Location: [facility];  Service: General;  Laterality: N/A;  . PARTIAL MASTECTOMY WITH NEEDLE LOCALIZATION  08/12/2012   Procedure: PARTIAL MASTECTOMY WITH NEEDLE LOCALIZATION;  Surgeon: Joyice Faster. Cornett, MD;  Location: [facility];  Service: General;  Laterality: Right;  . REDUCTION MAMMAPLASTY Bilateral 1995  . THYROIDECTOMY Left 07/07/2014   Procedure: LEFT HEMI-THYROIDECTOMY;  Surgeon: Ascencion Dike, MD;  Location: [facility];  Service: ENT;  Laterality: Left;  . THYROIDECTOMY Right 07/08/2014   Procedure: Completion THYROIDECTOMY;  Surgeon: Ascencion Dike, MD;  Location: [facility];  Service: ENT;  Laterality: Right;  . THYROIDECTOMY, PARTIAL Left 07/07/2014   hemi    SOCIAL HISTORY: Social History   Tobacco Use  . Smoking status: Never Smoker  . Smokeless tobacco: Never Used  Substance Use Topics  . Alcohol use: Yes    Comment: 07/07/2014 "a few times/year; weddings, anniversary, etc"  . Drug use: No    FAMILY HISTORY: Family History  Problem Relation Age of Onset  . Cancer Mother        Pancreatic  . Diabetes Mother   .  Hypertension Mother   . Depression Mother   . Hypertension Father   . Alcoholism  Father     ROS: ROS not noted.  PHYSICAL EXAM: Pt in no acute distress  RECENT LABS AND TESTS: BMET    Component Value Date/Time   NA 146 (H) 11/13/2018 1425   NA 143 05/28/2013 0844   K 4.6 11/13/2018 1425   K 4.4 05/28/2013 0844   CL 99 11/13/2018 1425   CL 99 11/20/2012 0940   CO2 25 11/13/2018 1425   CO2 28 05/28/2013 0844   GLUCOSE 104 (H) 11/13/2018 1425   GLUCOSE 226 (H) 05/29/2018 0839   GLUCOSE 94 05/28/2013 0844   GLUCOSE 142 (H) 11/20/2012 0940   BUN 12 11/13/2018 1425   BUN 16.9 05/28/2013 0844   CREATININE 0.83 11/13/2018 1425   CREATININE 0.8 05/28/2013 0844   CALCIUM 9.9 11/13/2018 1425   CALCIUM 9.8 05/28/2013 0844   GFRNONAA 77 11/13/2018 1425   GFRAA 89 11/13/2018 1425   Lab Results  Component Value Date   HGBA1C 7.8 (H) 11/13/2018   HGBA1C 8.6 (H) 08/07/2018   HGBA1C 8.4 (H) 04/17/2018   HGBA1C 9.2 (A) 01/08/2018   Lab Results  Component Value Date   INSULIN 16.8 04/17/2018   CBC    Component Value Date/Time   WBC 7.6 05/29/2018 0839   RBC 5.60 (H) 05/29/2018 0839   HGB 12.3 05/29/2018 0839   HGB 12.2 04/17/2018 1038   HGB 12.9 05/28/2013 0844   HCT 42.5 05/29/2018 0839   HCT 38.5 04/17/2018 1038   HCT 40.8 05/28/2013 0844   PLT 281 05/29/2018 0839   PLT 317 05/28/2013 0844   MCV 75.9 (L) 05/29/2018 0839   MCV 73 (L) 04/17/2018 1038   MCV 72.1 (L) 05/28/2013 0844   MCH 22.0 (L) 05/29/2018 0839   MCHC 28.9 (L) 05/29/2018 0839   RDW 18.3 (H) 05/29/2018 0839   RDW 16.0 (H) 04/17/2018 1038   RDW 17.6 (H) 05/28/2013 0844   LYMPHSABS 1.3 05/29/2018 0839   LYMPHSABS 1.4 04/17/2018 1038   LYMPHSABS 1.7 05/28/2013 0844   MONOABS 0.8 05/29/2018 0839   MONOABS 0.8 05/28/2013 0844   EOSABS 0.4 05/29/2018 0839   EOSABS 0.2 04/17/2018 1038   BASOSABS 0.1 05/29/2018 0839   BASOSABS 0.1 04/17/2018 1038   BASOSABS 0.1 05/28/2013 0844   Iron/TIBC/Ferritin/  %Sat    Component Value Date/Time   FERRITIN 25 07/05/2011 0844   Lipid Panel     Component Value Date/Time   CHOL 208 (H) 11/13/2018 1425   TRIG 145 11/13/2018 1425   HDL 49 11/13/2018 1425   CHOLHDL 4.2 11/13/2018 1425   LDLCALC 130 (H) 11/13/2018 1425   Hepatic Function Panel     Component Value Date/Time   PROT 7.5 11/13/2018 1425   PROT 7.9 05/28/2013 0844   ALBUMIN 4.4 11/13/2018 1425   ALBUMIN 3.7 05/28/2013 0844   AST 20 11/13/2018 1425   AST 15 05/28/2013 0844   ALT 34 (H) 11/13/2018 1425   ALT 23 05/28/2013 0844   ALKPHOS 63 11/13/2018 1425   ALKPHOS 58 05/28/2013 0844   BILITOT <0.2 11/13/2018 1425   BILITOT 0.28 05/28/2013 0844      Component Value Date/Time   TSH 1.620 08/07/2018 1246   TSH 0.449 (L) 04/17/2018 1038   Results for ZAWADI, APLIN (MRN [id number]) as of 11/17/2018 12:02  Ref. Range 04/17/2018 10:38  Vitamin D, 25-Hydroxy Latest Ref Range: 30.0 - 100.0 ng/mL 46.0    I, Michaelene Song, am acting as Location manager for [employer], FNP-C.  I have reviewed the above  documentation for accuracy and completeness, and I agree with the above.  - Jya Hughston, FNP-C.

## 2018-11-18 ENCOUNTER — Other Ambulatory Visit: Payer: Self-pay | Admitting: Internal Medicine

## 2018-12-01 ENCOUNTER — Other Ambulatory Visit: Payer: Self-pay

## 2018-12-01 ENCOUNTER — Ambulatory Visit (INDEPENDENT_AMBULATORY_CARE_PROVIDER_SITE_OTHER): Payer: BC Managed Care – PPO | Admitting: Family Medicine

## 2018-12-01 DIAGNOSIS — Z6837 Body mass index (BMI) 37.0-37.9, adult: Secondary | ICD-10-CM

## 2018-12-04 NOTE — Progress Notes (Signed)
Erroneous encounter. Pt rescheduled appointment.

## 2018-12-09 ENCOUNTER — Other Ambulatory Visit: Payer: Self-pay

## 2018-12-09 ENCOUNTER — Ambulatory Visit (INDEPENDENT_AMBULATORY_CARE_PROVIDER_SITE_OTHER): Payer: BC Managed Care – PPO | Admitting: Bariatrics

## 2018-12-09 ENCOUNTER — Encounter (INDEPENDENT_AMBULATORY_CARE_PROVIDER_SITE_OTHER): Payer: Self-pay | Admitting: Bariatrics

## 2018-12-09 ENCOUNTER — Other Ambulatory Visit: Payer: Self-pay | Admitting: Internal Medicine

## 2018-12-09 DIAGNOSIS — E1129 Type 2 diabetes mellitus with other diabetic kidney complication: Secondary | ICD-10-CM | POA: Diagnosis not present

## 2018-12-09 DIAGNOSIS — I10 Essential (primary) hypertension: Secondary | ICD-10-CM

## 2018-12-09 DIAGNOSIS — R809 Proteinuria, unspecified: Secondary | ICD-10-CM | POA: Diagnosis not present

## 2018-12-09 DIAGNOSIS — Z6837 Body mass index (BMI) 37.0-37.9, adult: Secondary | ICD-10-CM

## 2018-12-09 NOTE — Progress Notes (Signed)
Office: [phone]  /  Fax: [phone] TeleHealth Visit:  Brittany Rubio has verbally consented to this TeleHealth visit today. The patient is driving to work, the provider is located at the [employer] [facility] office. The participants in this visit include the listed provider and patient and any and all parties involved. The visit was conducted today via FaceTime.  HPI:   Chief Complaint: OBESITY Brittany Rubio is here to discuss her progress with her obesity treatment plan. She is on the Category 2 plan and is following her eating plan approximately 40 % of the time. She states she is exercising 0 minutes 0 times per week. Brittany Rubio states that she has lost 6 pounds (weight 224 lbs). She is at work. Brittany Rubio has been monitoring her blood sugars more consistently. She has started the [employer] milk. We were unable to weigh the patient today for this TeleHealth visit. She feels as if she has lost weight since her last visit. She has gained 3 lbs since starting treatment with [place].  Diabetes II with microalbuminuria Brittany Rubio has a diagnosis of diabetes type II. She is taking [place] and Janumet. Brittany Rubio states fasting BGs range between 118 and 141 and post prandial BGs range in the 160's. Brittany Rubio denies any hypoglycemic episodes. Last A1c was at 7.8 She has been working on intensive lifestyle modifications including diet, exercise, and weight loss to help control her blood glucose levels.  Hypertension MIKAEL DEBELL is a 58 y.o. female with hypertension. She is taking Tiazac and Zestoretic. Larina Bras denies chest pain or shortness of breath on exertion. She is working weight loss to help control her blood pressure with the goal of decreasing her risk of heart attack and stroke. [place] blood pressure is controlled.  ASSESSMENT AND PLAN:  Type 2 diabetes mellitus with microalbuminuria, without long-term current use of insulin (HCC)  Essential hypertension  Class 2 severe  obesity with serious comorbidity and body mass index (BMI) of 37.0 to 37.9 in adult, unspecified obesity type ([place])  PLAN:  Diabetes II with microalbuminuria Brittany Rubio has been given extensive diabetes education by myself today including ideal fasting and post-prandial blood glucose readings, individual ideal Hgb A1c goals and hypoglycemia prevention. We discussed the importance of good blood sugar control to decrease the likelihood of diabetic complications such as nephropathy, neuropathy, limb loss, blindness, coronary artery disease, and death. We discussed the importance of intensive lifestyle modification including diet, exercise and weight loss as the first line treatment for diabetes. Brittany Rubio agrees to continue her diabetes medications and will follow up at the agreed upon time.  Hypertension We discussed sodium restriction, working on healthy weight loss, and a regular exercise program as the means to achieve improved blood pressure control. Brittany Rubio agreed with this plan and agreed to follow up as directed. We will continue to monitor her blood pressure as well as her progress with the above lifestyle modifications. She will continue her medications as prescribed and will watch for signs of hypotension as she continues her lifestyle modifications.  Obesity Brittany Rubio is currently in the action stage of change. As such, her goal is to continue with weight loss efforts She has agreed to follow the Category 2 plan +100 calories Brittany Rubio will increase her steps (wears FitBit) for weight loss and overall health benefits. We discussed the following Behavioral Modification Strategies today: increase H2O intake, no skipping meals, keeping healthy foods in the home, increasing lean protein intake, decreasing simple carbohydrates and sweets, increasing vegetables, decrease eating out  and work on meal planning and easy cooking plans Brittany Rubio will weigh herself at home until the next appointment.   Brittany Rubio has agreed to follow up with our clinic in 2 weeks. She was informed of the importance of frequent follow up visits to maximize her success with intensive lifestyle modifications for her multiple health conditions.  ALLERGIES: Allergies  Allergen Reactions  . Crab [Shellfish Allergy] Hives and Swelling    "Only when I eat too many crab legs" Can tolerate CT scans with contrast-does not need premeds      MEDICATIONS: Current Outpatient Medications on File Prior to Visit  Medication Sig Dispense Refill  . amphetamine-dextroamphetamine (ADDERALL XR) 20 MG 24 hr capsule Take 1 capsule (20 mg total) by mouth daily. Take with 30 mg to equal 50 mg daily 30 capsule 0  . amphetamine-dextroamphetamine (ADDERALL XR) 30 MG 24 hr capsule Take 30 mg by mouth daily. Take with 20 mg to equal 50 mg daily    . aspirin EC 81 MG tablet Take 81 mg by mouth daily.    . blood glucose meter kit and supplies KIT Dispense based on patient and insurance preference. Use up to four times daily as directed. (FOR ICD-9 250.00, 250.01). 1 each 0  . butorphanol (STADOL) 10 MG/ML nasal spray Place 1 spray into the nose 3 (three) times daily as needed for migraine. 2.5 mL 1  . cholecalciferol (VITAMIN D3) 25 MCG (1000 UT) tablet Take 1,000 Units by mouth daily.    . Cyanocobalamin (VITAMIN B-12 PO) Take 1 tablet by mouth once a week.     . diazepam (VALIUM) 5 MG tablet TAKE 1 TABLET (5 MG TOTAL) BY MOUTH DAILY AS NEEDED FOR ANXIETY. 30 tablet 0  . diltiazem (TIAZAC) 240 MG 24 hr capsule TAKE 1 TABLET BY MOUTH AT BEDTIME FOR BP 90 capsule 1  . EPINEPHrine (EPIPEN 2-PAK) 0.3 mg/0.3 mL IJ SOAJ injection 0.3 mLs by Subdermal route once as needed. Allergic reaction    . esomeprazole (NEXIUM) 20 MG capsule Take 20 mg by mouth daily as needed (acid reflux).     Marland Kitchen FARXIGA 10 MG TABS tablet TAKE 1 TABLET BY MOUTH EVERY DAY 30 tablet 5  . fexofenadine (ALLEGRA) 180 MG tablet Take 180 mg by mouth daily as needed for allergies  or rhinitis.    Marland Kitchen JANUMET 50-1000 MG tablet TAKE 1 TABLET BY MOUTH TWICE A DAY WITH MEALS 180 tablet 1  . levothyroxine (SYNTHROID, LEVOTHROID) 112 MCG tablet Take 112 mcg by mouth every morning. Take 2 tabs daily    . lisinopril-hydrochlorothiazide (PRINZIDE,ZESTORETIC) 20-25 MG tablet TAKE 1 TABLET BY MOUTH EVERY DAY 90 tablet 1  . Magnesium 500 MG CAPS Take 500 mg by mouth daily.     . promethazine (PHENERGAN) 25 MG tablet Take 25 mg by mouth every 8 (eight) hours as needed for nausea or vomiting.   0  . ranitidine (ZANTAC) 150 MG tablet Take 150-300 mg by mouth See admin instructions. Take 300 mg in the morning and may take an additional 150 mg at night as needed for heartburn    . traZODone (DESYREL) 50 MG tablet Take 2 tabs po qhs 180 tablet 2  . Turmeric Curcumin 500 MG CAPS Take 500 mg by mouth daily.     Marland Kitchen VIIBRYD 40 MG TABS TAKE 1 TABLET BY MOUTH EVERY DAY WITH FOOD 30 tablet 4  . vitamin C (ASCORBIC ACID) 500 MG tablet Take 500 mg by mouth daily.  No current facility-administered medications on file prior to visit.     PAST MEDICAL HISTORY: Past Medical History:  Diagnosis Date  . ADD (attention deficit disorder)   . Anemia   . Anxiety   . Arthritis    "left shoulder" (07/07/2014)  . Back pain   . Breast cancer ([place])    "left"  . Depression   . Fatty liver   . Food allergy    shellfish  . GERD (gastroesophageal reflux disease)    "takes over the counters as needed"  . Hernia, umbilical   . High cholesterol   . Hypertension 11/26/2011   sees Dr. Bryon Lions  . Hypothyroidism   . Joint pain   . Migraines    "maybe once q 3 months" (07/07/2014)  . Multinodular thyroid 2015  . OSA on CPAP   . Personal history of radiation therapy   . Pneumonia    May 2018  . Thyroid cancer ([place])    2015  . Type II diabetes mellitus ([place])   . Vitamin D deficiency     PAST SURGICAL HISTORY: Past Surgical History:  Procedure Laterality Date  . ABDOMINAL HYSTERECTOMY  ?  1997  . BREAST EXCISIONAL BIOPSY Right 2014  . BREAST LUMPECTOMY Left 2009  . BREAST LUMPECTOMY Right 07/2012   "not a mastectomy"  . BREAST REDUCTION SURGERY Bilateral   . DIAPHRAGM SURGERY  1986   "trauma"  . INCISIONAL HERNIA REPAIR N/A 06/05/2018   Procedure: LAPAROSCOPIC INCISIONAL HERNIA ERAS PATHWAY;  Surgeon: Erroll Luna, MD;  Location: [place];  Service: General;  Laterality: N/A;  . INSERTION OF MESH N/A 06/05/2018   Procedure: INSERTION OF MESH;  Surgeon: Erroll Luna, MD;  Location: [place];  Service: General;  Laterality: N/A;  . PARTIAL MASTECTOMY WITH NEEDLE LOCALIZATION  08/12/2012   Procedure: PARTIAL MASTECTOMY WITH NEEDLE LOCALIZATION;  Surgeon: Joyice Faster. Cornett, MD;  Location: [place];  Service: General;  Laterality: Right;  . REDUCTION MAMMAPLASTY Bilateral 1995  . THYROIDECTOMY Left 07/07/2014   Procedure: LEFT HEMI-THYROIDECTOMY;  Surgeon: Ascencion Dike, MD;  Location: [place];  Service: ENT;  Laterality: Left;  . THYROIDECTOMY Right 07/08/2014   Procedure: Completion THYROIDECTOMY;  Surgeon: Ascencion Dike, MD;  Location: [place];  Service: ENT;  Laterality: Right;  . THYROIDECTOMY, PARTIAL Left 07/07/2014   hemi    SOCIAL HISTORY: Social History   Tobacco Use  . Smoking status: Never Smoker  . Smokeless tobacco: Never Used  Substance Use Topics  . Alcohol use: Yes    Comment: 07/07/2014 "a few times/year; weddings, anniversary, etc"  . Drug use: No    FAMILY HISTORY: Family History  Problem Relation Age of Onset  . Cancer Mother        Pancreatic  . Diabetes Mother   . Hypertension Mother   . Depression Mother   . Hypertension Father   . Alcoholism Father     ROS: Review of Systems  Constitutional: Positive for weight loss.  Respiratory: Negative for shortness of breath (on exertion).   Cardiovascular: Negative for chest pain.  Endo/Heme/Allergies:       Negative for hypoglycemia    PHYSICAL EXAM: Pt in no acute distress  RECENT LABS AND TESTS:  BMET    Component Value Date/Time   NA 146 (H) 11/13/2018 1425   NA 143 05/28/2013 0844   K 4.6 11/13/2018 1425   K 4.4 05/28/2013 0844   CL 99 11/13/2018 1425   CL 99 11/20/2012 0940  CO2 25 11/13/2018 1425   CO2 28 05/28/2013 0844   GLUCOSE 104 (H) 11/13/2018 1425   GLUCOSE 226 (H) 05/29/2018 0839   GLUCOSE 94 05/28/2013 0844   GLUCOSE 142 (H) 11/20/2012 0940   BUN 12 11/13/2018 1425   BUN 16.9 05/28/2013 0844   CREATININE 0.83 11/13/2018 1425   CREATININE 0.8 05/28/2013 0844   CALCIUM 9.9 11/13/2018 1425   CALCIUM 9.8 05/28/2013 0844   GFRNONAA 77 11/13/2018 1425   GFRAA 89 11/13/2018 1425   Lab Results  Component Value Date   HGBA1C 7.8 (H) 11/13/2018   HGBA1C 8.6 (H) 08/07/2018   HGBA1C 8.4 (H) 04/17/2018   HGBA1C 9.2 (A) 01/08/2018   Lab Results  Component Value Date   INSULIN 16.8 04/17/2018   CBC    Component Value Date/Time   WBC 7.6 05/29/2018 0839   RBC 5.60 (H) 05/29/2018 0839   HGB 12.3 05/29/2018 0839   HGB 12.2 04/17/2018 1038   HGB 12.9 05/28/2013 0844   HCT 42.5 05/29/2018 0839   HCT 38.5 04/17/2018 1038   HCT 40.8 05/28/2013 0844   PLT 281 05/29/2018 0839   PLT 317 05/28/2013 0844   MCV 75.9 (L) 05/29/2018 0839   MCV 73 (L) 04/17/2018 1038   MCV 72.1 (L) 05/28/2013 0844   MCH 22.0 (L) 05/29/2018 0839   MCHC 28.9 (L) 05/29/2018 0839   RDW 18.3 (H) 05/29/2018 0839   RDW 16.0 (H) 04/17/2018 1038   RDW 17.6 (H) 05/28/2013 0844   LYMPHSABS 1.3 05/29/2018 0839   LYMPHSABS 1.4 04/17/2018 1038   LYMPHSABS 1.7 05/28/2013 0844   MONOABS 0.8 05/29/2018 0839   MONOABS 0.8 05/28/2013 0844   EOSABS 0.4 05/29/2018 0839   EOSABS 0.2 04/17/2018 1038   BASOSABS 0.1 05/29/2018 0839   BASOSABS 0.1 04/17/2018 1038   BASOSABS 0.1 05/28/2013 0844   Iron/TIBC/Ferritin/ %Sat    Component Value Date/Time   FERRITIN 25 07/05/2011 0844   Lipid Panel     Component Value Date/Time   CHOL 208 (H) 11/13/2018 1425   TRIG 145 11/13/2018 1425   HDL 49  11/13/2018 1425   CHOLHDL 4.2 11/13/2018 1425   LDLCALC 130 (H) 11/13/2018 1425   Hepatic Function Panel     Component Value Date/Time   PROT 7.5 11/13/2018 1425   PROT 7.9 05/28/2013 0844   ALBUMIN 4.4 11/13/2018 1425   ALBUMIN 3.7 05/28/2013 0844   AST 20 11/13/2018 1425   AST 15 05/28/2013 0844   ALT 34 (H) 11/13/2018 1425   ALT 23 05/28/2013 0844   ALKPHOS 63 11/13/2018 1425   ALKPHOS 58 05/28/2013 0844   BILITOT <0.2 11/13/2018 1425   BILITOT 0.28 05/28/2013 0844      Component Value Date/Time   TSH 1.620 08/07/2018 1246   TSH 0.449 (L) 04/17/2018 1038     Ref. Range 04/17/2018 10:38  Vitamin D, 25-Hydroxy Latest Ref Range: 30.0 - 100.0 ng/mL 46.0    I, Doreene Nest, am acting as Location manager for [employer]. Owens Shark, DO  I have reviewed the above documentation for accuracy and completeness, and I agree with the above. -Jearld Lesch, DO

## 2018-12-23 ENCOUNTER — Encounter (INDEPENDENT_AMBULATORY_CARE_PROVIDER_SITE_OTHER): Payer: Self-pay | Admitting: Bariatrics

## 2018-12-23 ENCOUNTER — Ambulatory Visit (INDEPENDENT_AMBULATORY_CARE_PROVIDER_SITE_OTHER): Payer: BC Managed Care – PPO | Admitting: Bariatrics

## 2018-12-23 ENCOUNTER — Other Ambulatory Visit: Payer: Self-pay

## 2018-12-23 DIAGNOSIS — R809 Proteinuria, unspecified: Secondary | ICD-10-CM

## 2018-12-23 DIAGNOSIS — I10 Essential (primary) hypertension: Secondary | ICD-10-CM

## 2018-12-23 DIAGNOSIS — E1129 Type 2 diabetes mellitus with other diabetic kidney complication: Secondary | ICD-10-CM | POA: Diagnosis not present

## 2018-12-23 DIAGNOSIS — F3289 Other specified depressive episodes: Secondary | ICD-10-CM

## 2018-12-23 DIAGNOSIS — Z6837 Body mass index (BMI) 37.0-37.9, adult: Secondary | ICD-10-CM

## 2018-12-23 MED ORDER — ONETOUCH ULTRASOFT LANCETS MISC: 0 refills | Status: DC

## 2018-12-23 MED ORDER — BLOOD GLUCOSE MONITOR KIT: PACK | 0 refills | Status: DC

## 2018-12-23 MED ORDER — GLUCOSE BLOOD VI STRP: ORAL_STRIP | 0 refills | Status: DC

## 2018-12-24 NOTE — Progress Notes (Signed)
Office: [phone]  /  Fax: [phone] TeleHealth Visit:  Brittany Rubio has verbally consented to this TeleHealth visit today. The patient is located at home, the provider is located at the [facility] office. The participants in this visit include the listed provider and patient and any and all parties involved. The visit was conducted today via FaceTime.  HPI:   Chief Complaint: OBESITY Brittany Rubio is here to discuss her progress with her obesity treatment plan. She is on the Category 2 plan +100 calories and is following her eating plan approximately 30 % of the time. She states she is walking 30 minutes 3 times per week. Brittany Rubio states that she believes that she has lost weight, but she is unsure. Brittany Rubio struggles with her "sweets". We were unable to weigh the patient today for this TeleHealth visit. She feels as if she has lost weight since her last visit.   Diabetes II with microalbuminuria Brittany Rubio has a diagnosis of diabetes type II. Brittany Rubio states fasting BGs range in the 140's and she denies any hypoglycemic episodes. Last A1c was at 7.8 She has been working on intensive lifestyle modifications including diet, exercise, and weight loss to help control her blood glucose levels.  Hypertension Brittany Rubio is a 58 y.o. female with hypertension. She is taking Tiazac and Zestoretic. Brittany Rubio denies chest pain or shortness of breath on exertion. She is working weight loss to help control her blood pressure with the goal of decreasing her risk of heart attack and stroke. Brittany Rubio blood pressure is currently controlled.  Depression with emotional eating behaviors Brittany Rubio struggles with emotional eating and using food for comfort to the extent that it is negatively impacting her health. She often snacks when she is not hungry. Brittany Rubio sometimes feels she is out of control and then feels guilty that she made poor food choices. She has been working on  behavior modification techniques to help reduce her emotional eating and has been somewhat successful. We discussed Wellbutrin (tried in the past). She shows no sign of suicidal or homicidal ideations.  ASSESSMENT AND PLAN:  Type 2 diabetes mellitus with microalbuminuria, without long-term current use of insulin (HCC) - Plan: Lancets (ONETOUCH ULTRASOFT) lancets, glucose blood (ONETOUCH ULTRA) test strip, blood glucose meter kit and supplies KIT  Essential hypertension  Other depression - with emotional eating  Class 2 severe obesity with serious comorbidity and body mass index (BMI) of 37.0 to 37.9 in adult, unspecified obesity type ([place])  PLAN:  Diabetes II with microalbuminuria Brittany Rubio has been given extensive diabetes education by myself today including ideal fasting and post-prandial blood glucose readings, individual ideal Hgb A1c goals and hypoglycemia prevention. We discussed the importance of good blood sugar control to decrease the likelihood of diabetic complications such as nephropathy, neuropathy, limb loss, blindness, coronary artery disease, and death. We discussed the importance of intensive lifestyle modification including diet, exercise and weight loss as the first line treatment for diabetes. Brittany Rubio will continue to test her blood sugars daily and prescriptions were written today for glucose testing strips (testing daily) #100 with no refills and Lancets (daily use) #100 with no refills. She agreed to follow up as directed.  Hypertension We discussed sodium restriction, working on healthy weight loss, and a regular exercise program as the means to achieve improved blood pressure control. Brittany Rubio agreed with this plan and agreed to follow up as directed. We will continue to monitor her blood pressure as well as her progress with  the above lifestyle modifications. She will continue her medications as prescribed and will watch for signs of hypotension as she continues her  lifestyle modifications.  Depression with Emotional Eating Behaviors We discussed behavior modification techniques today to help Brittany Rubio deal with her emotional eating and depression. Brittany Rubio will consider Trokendi and she will talk to her PCP. Brittany Rubio will follow up as directed.  Obesity Brittany Rubio is currently in the action stage of change. As such, her goal is to continue with weight loss efforts She has agreed to follow the Category 2 plan +100 calories Brittany Rubio will continue walking to her walking tape and she will increase to 4 to 5 times per week for weight loss and overall health benefits. We discussed the following Behavioral Modification Strategies today: planning for success, increase H2O intake, no skipping meals, keeping healthy foods in the home, increasing lean protein intake, decreasing simple carbohydrates , increasing vegetables, decrease eating out and work on meal planning and easy cooking plans Brittany Rubio will weigh herself at home and record.  Brittany Rubio has agreed to follow up with our clinic in 2 weeks. She was informed of the importance of frequent follow up visits to maximize her success with intensive lifestyle modifications for her multiple health conditions.  ALLERGIES: Allergies  Allergen Reactions  . Crab [Shellfish Allergy] Hives and Swelling    "Only when I eat too many crab legs" Can tolerate CT scans with contrast-does not need premeds      MEDICATIONS: Current Outpatient Medications on File Prior to Visit  Medication Sig Dispense Refill  . amphetamine-dextroamphetamine (ADDERALL XR) 20 MG 24 hr capsule Take 1 capsule (20 mg total) by mouth daily. Take with 30 mg to equal 50 mg daily 30 capsule 0  . amphetamine-dextroamphetamine (ADDERALL XR) 30 MG 24 hr capsule Take 30 mg by mouth daily. Take with 20 mg to equal 50 mg daily    . aspirin EC 81 MG tablet Take 81 mg by mouth daily.    . blood glucose meter kit and supplies KIT Dispense based on patient and  insurance preference. Use up to four times daily as directed. (FOR ICD-9 250.00, 250.01). 1 each 0  . butorphanol (STADOL) 10 MG/ML nasal spray Place 1 spray into the nose 3 (three) times daily as needed for migraine. 2.5 mL 1  . cholecalciferol (VITAMIN D3) 25 MCG (1000 UT) tablet Take 1,000 Units by mouth daily.    . Cyanocobalamin (VITAMIN B-12 PO) Take 1 tablet by mouth once a week.     . diazepam (VALIUM) 5 MG tablet TAKE 1 TABLET (5 MG TOTAL) BY MOUTH DAILY AS NEEDED FOR ANXIETY. 30 tablet 0  . diltiazem (TIAZAC) 240 MG 24 hr capsule TAKE 1 TABLET BY MOUTH AT BEDTIME FOR BP 90 capsule 1  . EPINEPHrine (EPIPEN 2-PAK) 0.3 mg/0.3 mL IJ SOAJ injection 0.3 mLs by Subdermal route once as needed. Allergic reaction    . esomeprazole (NEXIUM) 20 MG capsule Take 20 mg by mouth daily as needed (acid reflux).     Marland Kitchen FARXIGA 10 MG TABS tablet TAKE 1 TABLET BY MOUTH EVERY DAY 30 tablet 5  . fexofenadine (ALLEGRA) 180 MG tablet Take 180 mg by mouth daily as needed for allergies or rhinitis.    Marland Kitchen JANUMET 50-1000 MG tablet TAKE 1 TABLET BY MOUTH TWICE A DAY WITH MEALS 180 tablet 1  . levothyroxine (SYNTHROID, LEVOTHROID) 112 MCG tablet Take 112 mcg by mouth every morning. Take 2 tabs daily    . lisinopril-hydrochlorothiazide (PRINZIDE,ZESTORETIC)  20-25 MG tablet TAKE 1 TABLET BY MOUTH EVERY DAY 90 tablet 1  . Magnesium 500 MG CAPS Take 500 mg by mouth daily.     . promethazine (PHENERGAN) 25 MG tablet Take 25 mg by mouth every 8 (eight) hours as needed for nausea or vomiting.   0  . ranitidine (ZANTAC) 150 MG tablet Take 150-300 mg by mouth See admin instructions. Take 300 mg in the morning and may take an additional 150 mg at night as needed for heartburn    . traZODone (DESYREL) 50 MG tablet Take 2 tabs po qhs 180 tablet 2  . Turmeric Curcumin 500 MG CAPS Take 500 mg by mouth daily.     Marland Kitchen VIIBRYD 40 MG TABS TAKE 1 TABLET BY MOUTH EVERY DAY WITH FOOD 30 tablet 4  . vitamin C (ASCORBIC ACID) 500 MG tablet  Take 500 mg by mouth daily.     No current facility-administered medications on file prior to visit.     PAST MEDICAL HISTORY: Past Medical History:  Diagnosis Date  . ADD (attention deficit disorder)   . Anemia   . Anxiety   . Arthritis    "left shoulder" (07/07/2014)  . Back pain   . Breast cancer ([place])    "left"  . Depression   . Fatty liver   . Food allergy    shellfish  . GERD (gastroesophageal reflux disease)    "takes over the counters as needed"  . Hernia, umbilical   . High cholesterol   . Hypertension 11/26/2011   sees Dr. Bryon Lions  . Hypothyroidism   . Joint pain   . Migraines    "maybe once q 3 months" (07/07/2014)  . Multinodular thyroid 2015  . OSA on CPAP   . Personal history of radiation therapy   . Pneumonia    May 2018  . Thyroid cancer ([place])    2015  . Type II diabetes mellitus ([place])   . Vitamin D deficiency     PAST SURGICAL HISTORY: Past Surgical History:  Procedure Laterality Date  . ABDOMINAL HYSTERECTOMY  ? 1997  . BREAST EXCISIONAL BIOPSY Right 2014  . BREAST LUMPECTOMY Left 2009  . BREAST LUMPECTOMY Right 07/2012   "not a mastectomy"  . BREAST REDUCTION SURGERY Bilateral   . DIAPHRAGM SURGERY  1986   "trauma"  . INCISIONAL HERNIA REPAIR N/A 06/05/2018   Procedure: LAPAROSCOPIC INCISIONAL HERNIA ERAS PATHWAY;  Surgeon: Erroll Luna, MD;  Location: [facility];  Service: General;  Laterality: N/A;  . INSERTION OF MESH N/A 06/05/2018   Procedure: INSERTION OF MESH;  Surgeon: Erroll Luna, MD;  Location: [facility];  Service: General;  Laterality: N/A;  . PARTIAL MASTECTOMY WITH NEEDLE LOCALIZATION  08/12/2012   Procedure: PARTIAL MASTECTOMY WITH NEEDLE LOCALIZATION;  Surgeon: Joyice Faster. Cornett, MD;  Location: [facility];  Service: General;  Laterality: Right;  . REDUCTION MAMMAPLASTY Bilateral 1995  . THYROIDECTOMY Left 07/07/2014   Procedure: LEFT HEMI-THYROIDECTOMY;  Surgeon: Ascencion Dike, MD;  Location: [facility];  Service: ENT;  Laterality:  Left;  . THYROIDECTOMY Right 07/08/2014   Procedure: Completion THYROIDECTOMY;  Surgeon: Ascencion Dike, MD;  Location: [facility];  Service: ENT;  Laterality: Right;  . THYROIDECTOMY, PARTIAL Left 07/07/2014   hemi    SOCIAL HISTORY: Social History   Tobacco Use  . Smoking status: Never Smoker  . Smokeless tobacco: Never Used  Substance Use Topics  . Alcohol use: Yes    Comment: 07/07/2014 "a few times/year;  weddings, anniversary, etc"  . Drug use: No    FAMILY HISTORY: Family History  Problem Relation Age of Onset  . Cancer Mother        Pancreatic  . Diabetes Mother   . Hypertension Mother   . Depression Mother   . Hypertension Father   . Alcoholism Father     ROS: Review of Systems  Constitutional: Positive for weight loss.  Respiratory: Negative for shortness of breath (on exertion).   Cardiovascular: Negative for chest pain.  Endo/Heme/Allergies:       Negative for hypoglycemia  Psychiatric/Behavioral: Positive for depression. Negative for suicidal ideas.    PHYSICAL EXAM: Pt in no acute distress  RECENT LABS AND TESTS: BMET    Component Value Date/Time   NA 146 (H) 11/13/2018 1425   NA 143 05/28/2013 0844   K 4.6 11/13/2018 1425   K 4.4 05/28/2013 0844   CL 99 11/13/2018 1425   CL 99 11/20/2012 0940   CO2 25 11/13/2018 1425   CO2 28 05/28/2013 0844   GLUCOSE 104 (H) 11/13/2018 1425   GLUCOSE 226 (H) 05/29/2018 0839   GLUCOSE 94 05/28/2013 0844   GLUCOSE 142 (H) 11/20/2012 0940   BUN 12 11/13/2018 1425   BUN 16.9 05/28/2013 0844   CREATININE 0.83 11/13/2018 1425   CREATININE 0.8 05/28/2013 0844   CALCIUM 9.9 11/13/2018 1425   CALCIUM 9.8 05/28/2013 0844   GFRNONAA 77 11/13/2018 1425   GFRAA 89 11/13/2018 1425   Lab Results  Component Value Date   HGBA1C 7.8 (H) 11/13/2018   HGBA1C 8.6 (H) 08/07/2018   HGBA1C 8.4 (H) 04/17/2018   HGBA1C 9.2 (A) 01/08/2018   Lab Results  Component Value Date   INSULIN 16.8 04/17/2018   CBC    Component  Value Date/Time   WBC 7.6 05/29/2018 0839   RBC 5.60 (H) 05/29/2018 0839   HGB 12.3 05/29/2018 0839   HGB 12.2 04/17/2018 1038   HGB 12.9 05/28/2013 0844   HCT 42.5 05/29/2018 0839   HCT 38.5 04/17/2018 1038   HCT 40.8 05/28/2013 0844   PLT 281 05/29/2018 0839   PLT 317 05/28/2013 0844   MCV 75.9 (L) 05/29/2018 0839   MCV 73 (L) 04/17/2018 1038   MCV 72.1 (L) 05/28/2013 0844   MCH 22.0 (L) 05/29/2018 0839   MCHC 28.9 (L) 05/29/2018 0839   RDW 18.3 (H) 05/29/2018 0839   RDW 16.0 (H) 04/17/2018 1038   RDW 17.6 (H) 05/28/2013 0844   LYMPHSABS 1.3 05/29/2018 0839   LYMPHSABS 1.4 04/17/2018 1038   LYMPHSABS 1.7 05/28/2013 0844   MONOABS 0.8 05/29/2018 0839   MONOABS 0.8 05/28/2013 0844   EOSABS 0.4 05/29/2018 0839   EOSABS 0.2 04/17/2018 1038   BASOSABS 0.1 05/29/2018 0839   BASOSABS 0.1 04/17/2018 1038   BASOSABS 0.1 05/28/2013 0844   Iron/TIBC/Ferritin/ %Sat    Component Value Date/Time   FERRITIN 25 07/05/2011 0844   Lipid Panel     Component Value Date/Time   CHOL 208 (H) 11/13/2018 1425   TRIG 145 11/13/2018 1425   HDL 49 11/13/2018 1425   CHOLHDL 4.2 11/13/2018 1425   LDLCALC 130 (H) 11/13/2018 1425   Hepatic Function Panel     Component Value Date/Time   PROT 7.5 11/13/2018 1425   PROT 7.9 05/28/2013 0844   ALBUMIN 4.4 11/13/2018 1425   ALBUMIN 3.7 05/28/2013 0844   AST 20 11/13/2018 1425   AST 15 05/28/2013 0844   ALT 34 (H) 11/13/2018 1425  ALT 23 05/28/2013 0844   ALKPHOS 63 11/13/2018 1425   ALKPHOS 58 05/28/2013 0844   BILITOT <0.2 11/13/2018 1425   BILITOT 0.28 05/28/2013 0844      Component Value Date/Time   TSH 1.620 08/07/2018 1246   TSH 0.449 (L) 04/17/2018 1038     Ref. Range 04/17/2018 10:38  Vitamin D, 25-Hydroxy Latest Ref Range: 30.0 - 100.0 ng/mL 46.0    I, Doreene Nest, am acting as Location manager for [employer]. Owens Shark, DO  I have reviewed the above documentation for accuracy and completeness, and I agree with the above.  -Jearld Lesch, DO

## 2018-12-25 ENCOUNTER — Other Ambulatory Visit: Payer: Self-pay | Admitting: Internal Medicine

## 2018-12-31 LAB — HM DIABETES EYE EXAM

## 2019-01-06 ENCOUNTER — Telehealth (INDEPENDENT_AMBULATORY_CARE_PROVIDER_SITE_OTHER): Payer: BC Managed Care – PPO | Admitting: Bariatrics

## 2019-01-07 ENCOUNTER — Ambulatory Visit (INDEPENDENT_AMBULATORY_CARE_PROVIDER_SITE_OTHER): Payer: BC Managed Care – PPO | Admitting: Bariatrics

## 2019-01-12 ENCOUNTER — Other Ambulatory Visit: Payer: Self-pay | Admitting: Internal Medicine

## 2019-01-12 NOTE — Telephone Encounter (Signed)
Diazepam refills

## 2019-02-10 ENCOUNTER — Encounter: Payer: Self-pay | Admitting: Internal Medicine

## 2019-02-10 ENCOUNTER — Ambulatory Visit: Payer: BC Managed Care – PPO | Admitting: Internal Medicine

## 2019-02-10 ENCOUNTER — Other Ambulatory Visit: Payer: Self-pay

## 2019-02-10 VITALS — BP 140/82 | HR 82 | Temp 98.2°F | Ht 65.0 in | Wt 223.2 lb

## 2019-02-10 DIAGNOSIS — M79642 Pain in left hand: Secondary | ICD-10-CM | POA: Diagnosis not present

## 2019-02-10 DIAGNOSIS — I129 Hypertensive chronic kidney disease with stage 1 through stage 4 chronic kidney disease, or unspecified chronic kidney disease: Secondary | ICD-10-CM | POA: Diagnosis not present

## 2019-02-10 DIAGNOSIS — E1122 Type 2 diabetes mellitus with diabetic chronic kidney disease: Secondary | ICD-10-CM

## 2019-02-10 DIAGNOSIS — Z6837 Body mass index (BMI) 37.0-37.9, adult: Secondary | ICD-10-CM

## 2019-02-10 DIAGNOSIS — N182 Chronic kidney disease, stage 2 (mild): Secondary | ICD-10-CM

## 2019-02-10 DIAGNOSIS — M65332 Trigger finger, left middle finger: Secondary | ICD-10-CM

## 2019-02-10 MED ORDER — BUTORPHANOL TARTRATE 10 MG/ML NA SOLN: 1.0000 | Freq: Three times a day (TID) | NASAL | 1 refills | Status: DC | PRN

## 2019-02-10 NOTE — Patient Instructions (Signed)
Hand Pain Many things can cause hand pain. Some common causes are:  An injury.  Repeating the same movement with your hand over and over (overuse).  Osteoporosis.  Arthritis.  Lumps in the tendons or joints of the hand and wrist (ganglion cysts).  Nerve compression syndromes (carpal tunnel syndrome).  Inflammation of the tendons (tendinitis).  Infection. Follow these instructions at home: Pay attention to any changes in your symptoms. Take these actions to help with your discomfort: Managing pain, stiffness, and swelling   Take over-the-counter and prescription medicines only as told by your health care provider.  Wear a hand splint or support as told by your health care provider.  If directed, put ice on the affected area: ? Put ice in a plastic bag. ? Place a towel between your skin and the bag. ? Leave the ice on for 20 minutes, 2-3 times a day. Activity  Take breaks from repetitive activity often.  Avoid activities that make your pain worse.  Minimize stress on your hands and wrists as much as possible.  Do stretches or exercises as told by your health care provider.  Do not do activities that make your pain worse. Contact a health care provider if:  Your pain does not get better after a few days of self-care.  Your pain gets worse.  Your pain affects your ability to do your daily activities. Get help right away if:  Your hand becomes warm, red, or swollen.  Your hand is numb or tingling.  Your hand is extremely swollen or deformed.  Your hand or fingers turn white or blue.  You cannot move your hand, wrist, or fingers. Summary  Many things can cause hand pain.  Contact your health care provider if your pain does not get better after a few days of self care.  Minimize stress on your hands and wrists as much as possible.  Do not do activities that make your pain worse. This information is not intended to replace advice given to you by your  health care provider. Make sure you discuss any questions you have with your health care provider. Document Released: 07/29/2015 Document Revised: 03/28/2018 Document Reviewed: 03/28/2018 Elsevier Patient Education  2020 [employer].

## 2019-02-10 NOTE — Progress Notes (Signed)
Subjective:     Patient ID: Brittany Rubio , female    DOB: 1960-10-24 , 58 y.o.   MRN: [id number]   Chief Complaint  Patient presents with  . Diabetes  . Hypertension    HPI  Diabetes She presents for her follow-up diabetic visit. She has type 2 diabetes mellitus. Her disease course has been stable. Headaches: she has h/o migraines. needs refill of Stadol NS. Pertinent negatives for diabetes include no blurred vision and no chest pain. There are no hypoglycemic complications. Diabetic complications include nephropathy. Risk factors for coronary artery disease include diabetes mellitus, dyslipidemia, hypertension, obesity, sedentary lifestyle, post-menopausal and stress. Her weight is fluctuating minimally. She is following a diabetic diet. She participates in exercise intermittently. Her home blood glucose trend is increasing steadily. Her breakfast blood glucose is taken between 8-9 am. Her breakfast blood glucose range is generally 140-180 mg/dl. An ACE inhibitor/angiotensin II receptor blocker is being taken. Eye exam is current.  Hypertension This is a chronic problem. The current episode started more than 1 year ago. The problem has been gradually improving since onset. The problem is uncontrolled. Pertinent negatives include no blurred vision, chest pain, palpitations or shortness of breath. Headaches: she has h/o migraines. needs refill of Stadol NS. The current treatment provides moderate improvement. Compliance problems include exercise.  Hypertensive end-organ damage includes kidney disease.     Past Medical History:  Diagnosis Date  . ADD (attention deficit disorder)   . Anemia   . Anxiety   . Arthritis    "left shoulder" (07/07/2014)  . Back pain   . Breast cancer ([place])    "left"  . Depression   . Fatty liver   . Food allergy    shellfish  . GERD (gastroesophageal reflux disease)    "takes over the counters as needed"  . Hernia, umbilical   . High cholesterol   .  Hypertension 11/26/2011   sees Dr. Bryon Lions  . Hypothyroidism   . Joint pain   . Migraines    "maybe once q 3 months" (07/07/2014)  . Multinodular thyroid 2015  . OSA on CPAP   . Personal history of radiation therapy   . Pneumonia    May 2018  . Thyroid cancer ([place])    2015  . Type II diabetes mellitus ([place])   . Vitamin D deficiency      Family History  Problem Relation Age of Onset  . Cancer Mother        Pancreatic  . Diabetes Mother   . Hypertension Mother   . Depression Mother   . Hypertension Father   . Alcoholism Father      Current Outpatient Medications:  .  amphetamine-dextroamphetamine (ADDERALL XR) 20 MG 24 hr capsule, Take 1 capsule (20 mg total) by mouth daily. Take with 30 mg to equal 50 mg daily (Patient taking differently: Take by mouth daily. Take with 30 mg to equal 50 mg daily ), Disp: 30 capsule, Rfl: 0 .  amphetamine-dextroamphetamine (ADDERALL XR) 30 MG 24 hr capsule, Take 30 mg by mouth daily. Take with 20 mg to equal 50 mg daily, Disp: , Rfl:  .  amphetamine-dextroamphetamine (ADDERALL) 10 MG tablet, 1 TABLET BY MOUTH DAILY IN THE AFTERNOON, Disp: , Rfl:  .  aspirin EC 81 MG tablet, Take 81 mg by mouth daily., Disp: , Rfl:  .  butorphanol (STADOL) 10 MG/ML nasal spray, Place 1 spray into the nose 3 (three) times daily as needed for migraine.,  Disp: 2.5 mL, Rfl: 1 .  cholecalciferol (VITAMIN D3) 25 MCG (1000 UT) tablet, Take 1,000 Units by mouth daily., Disp: , Rfl:  .  diazepam (VALIUM) 5 MG tablet, TAKE 1 TABLET (5 MG TOTAL) BY MOUTH DAILY AS NEEDED FOR ANXIETY., Disp: 30 tablet, Rfl: 0 .  diltiazem (TIAZAC) 240 MG 24 hr capsule, TAKE 1 TABLET BY MOUTH AT BEDTIME FOR BP, Disp: 90 capsule, Rfl: 1 .  EPINEPHrine (EPIPEN 2-PAK) 0.3 mg/0.3 mL IJ SOAJ injection, 0.3 mLs by Subdermal route once as needed. Allergic reaction, Disp: , Rfl:  .  esomeprazole (NEXIUM) 20 MG capsule, Take 20 mg by mouth daily as needed (acid reflux). , Disp: , Rfl:  .  FARXIGA  10 MG TABS tablet, TAKE 1 TABLET BY MOUTH EVERY DAY, Disp: 30 tablet, Rfl: 5 .  fexofenadine (ALLEGRA) 180 MG tablet, Take 180 mg by mouth daily as needed for allergies or rhinitis., Disp: , Rfl:  .  glucose blood (ONETOUCH ULTRA) test strip, Test twice daily, Disp: 100 each, Rfl: 0 .  JANUMET 50-1000 MG tablet, TAKE 1 TABLET BY MOUTH TWICE A DAY WITH MEALS, Disp: 180 tablet, Rfl: 1 .  Lancets (ONETOUCH ULTRASOFT) lancets, Test twice daily, Disp: 100 each, Rfl: 0 .  levothyroxine (SYNTHROID, LEVOTHROID) 112 MCG tablet, Take 112 mcg by mouth every morning. Take 2 tabs daily, Disp: , Rfl:  .  lisinopril-hydrochlorothiazide (PRINZIDE,ZESTORETIC) 20-25 MG tablet, TAKE 1 TABLET BY MOUTH EVERY DAY, Disp: 90 tablet, Rfl: 1 .  Magnesium 500 MG CAPS, Take 500 mg by mouth daily. , Disp: , Rfl:  .  omega-3 acid ethyl esters (LOVAZA) 1 g capsule, Take by mouth 2 (two) times daily., Disp: , Rfl:  .  promethazine (PHENERGAN) 25 MG tablet, Take 25 mg by mouth every 8 (eight) hours as needed for nausea or vomiting. , Disp: , Rfl: 0 .  ranitidine (ZANTAC) 150 MG tablet, Take 150-300 mg by mouth See admin instructions. Take 300 mg in the morning and may take an additional 150 mg at night as needed for heartburn, Disp: , Rfl:  .  traZODone (DESYREL) 50 MG tablet, Take 2 tabs po qhs, Disp: 180 tablet, Rfl: 2 .  Turmeric Curcumin 500 MG CAPS, Take 500 mg by mouth daily. , Disp: , Rfl:  .  VIIBRYD 40 MG TABS, TAKE 1 TABLET BY MOUTH EVERY DAY WITH FOOD, Disp: 30 tablet, Rfl: 4 .  vitamin C (ASCORBIC ACID) 500 MG tablet, Take 500 mg by mouth daily., Disp: , Rfl:  .  Cyanocobalamin (VITAMIN B-12 PO), Take 1 tablet by mouth once a week. , Disp: , Rfl:    Allergies  Allergen Reactions  . Crab [Shellfish Allergy] Hives and Swelling    "Only when I eat too many crab legs" Can tolerate CT scans with contrast-does not need premeds       Review of Systems  Constitutional: Negative.   Eyes: Negative for blurred vision.   Respiratory: Negative.  Negative for shortness of breath.   Cardiovascular: Negative.  Negative for chest pain and palpitations.  Gastrointestinal: Negative.   Musculoskeletal: Positive for arthralgias.       She c/o left hand pain. States middle finger sometimes gets stuck. She reports her sx have been going on for greater than six months. Sx appear to be worsening in severity, this is why she is bringing it up now.   Neurological: Headaches: she has h/o migraines. needs refill of Stadol NS.  Psychiatric/Behavioral: Negative.  Today's Vitals   02/10/19 1158  BP: 140/82  Pulse: 82  Temp: 98.2 F (36.8 C)  TempSrc: Oral  Weight: 223 lb 3.2 oz (101.2 kg)  Height: 5' 5"  (1.651 m)   Body mass index is 37.14 kg/m.   Objective:  Physical Exam Vitals signs and nursing note reviewed.  Constitutional:      Appearance: Normal appearance.  HENT:     Head: Normocephalic and atraumatic.  Cardiovascular:     Rate and Rhythm: Normal rate and regular rhythm.     Heart sounds: Normal heart sounds.  Pulmonary:     Effort: Pulmonary effort is normal.     Breath sounds: Normal breath sounds.  Musculoskeletal:     Comments: Left middle finger PIP joint swelling. No overlying erythema. Also with tenderness at base of MCP joint.   Skin:    General: Skin is warm.  Neurological:     General: No focal deficit present.     Mental Status: She is alert.  Psychiatric:        Mood and Affect: Mood normal.        Behavior: Behavior normal.         Assessment And Plan:     1. Type 2 diabetes mellitus with stage 2 chronic kidney disease, without long-term current use of insulin ([place])  I will check labs as listed below. Importance of regular exercise was discussed with the patient. She is encouraged to aim for 150 minutes per week.   - BMP8+EGFR - Hemoglobin A1c  2. Hypertensive nephropathy  Fair control. She will continue with current meds for now. I think her elevation could be  exacerbated by use of Adderall. She is encouraged to limit her salt intake.   3. Left hand pain  I will refer her to Hand specialist for further evaluation. I think she be possibly developing contracture vs. Tendinitis.   - Ambulatory referral to Hand Surgery  4. Trigger middle finger of left hand  Again, she is in agreement with referral to hand specialist. Pt advised they may treat with steroid injection and sometimes condition requires surgical release. She is accepting of this information.   - Ambulatory referral to Hand Surgery  5. Class 2 severe obesity due to excess calories with serious comorbidity and body mass index (BMI) of 37.0 to 37.9 in adult [facility])     Importance of achieving optimal weight to decrease risk of cardiovascular disease and cancers was discussed with the patient in full detail. She is encouraged to start slowly - start with 10 minutes twice daily at least three to four days per week and to gradually build to 30 minutes five days weekly. She was given tips to incorporate more activity into her daily routine - take stairs when possible, park farther away from her job, grocery stores, etc.    [name], MD    THE PATIENT IS ENCOURAGED TO PRACTICE SOCIAL DISTANCING DUE TO THE COVID-19 PANDEMIC.

## 2019-02-11 ENCOUNTER — Other Ambulatory Visit: Payer: Self-pay | Admitting: Internal Medicine

## 2019-02-11 LAB — BMP8+EGFR
BUN/Creatinine Ratio: 16 (ref 9–23)
BUN: 16 mg/dL (ref 6–24)
CO2: 27 mmol/L (ref 20–29)
Calcium: 9.8 mg/dL (ref 8.7–10.2)
Chloride: 97 mmol/L (ref 96–106)
Creatinine, Ser: 1.03 mg/dL — ABNORMAL HIGH (ref 0.57–1.00)
GFR calc Af Amer: 69 mL/min/{1.73_m2} (ref >59)
GFR calc non Af Amer: 60 mL/min/{1.73_m2} (ref >59)
Glucose: 120 mg/dL — ABNORMAL HIGH (ref 65–99)
Potassium: 4.3 mmol/L (ref 3.5–5.2)
Sodium: 142 mmol/L (ref 134–144)

## 2019-02-11 LAB — HEMOGLOBIN A1C
Est. average glucose Bld gHb Est-mCnc: 174 mg/dL
Hgb A1c MFr Bld: 7.7 % — ABNORMAL HIGH (ref 4.8–5.6)

## 2019-02-17 ENCOUNTER — Encounter: Payer: Self-pay | Admitting: Internal Medicine

## 2019-02-21 ENCOUNTER — Other Ambulatory Visit (INDEPENDENT_AMBULATORY_CARE_PROVIDER_SITE_OTHER): Payer: Self-pay | Admitting: Bariatrics

## 2019-02-21 DIAGNOSIS — R809 Proteinuria, unspecified: Secondary | ICD-10-CM

## 2019-02-21 DIAGNOSIS — E1129 Type 2 diabetes mellitus with other diabetic kidney complication: Secondary | ICD-10-CM

## 2019-03-11 ENCOUNTER — Other Ambulatory Visit: Payer: Self-pay | Admitting: Internal Medicine

## 2019-03-11 DIAGNOSIS — C50919 Malignant neoplasm of unspecified site of unspecified female breast: Secondary | ICD-10-CM

## 2019-04-02 ENCOUNTER — Other Ambulatory Visit: Payer: Self-pay | Admitting: Internal Medicine

## 2019-04-23 ENCOUNTER — Other Ambulatory Visit: Payer: Self-pay | Admitting: Internal Medicine

## 2019-04-23 MED ORDER — BUTORPHANOL TARTRATE 10 MG/ML NA SOLN: 1.0000 | Freq: Three times a day (TID) | NASAL | 1 refills | Status: DC | PRN

## 2019-04-23 NOTE — Telephone Encounter (Signed)
Last 02/10/19 Next 05/18/19

## 2019-04-25 ENCOUNTER — Other Ambulatory Visit: Payer: Self-pay | Admitting: Internal Medicine

## 2019-04-25 NOTE — Telephone Encounter (Signed)
I think you have this already but it came again so I dont know if you did it

## 2019-05-03 ENCOUNTER — Other Ambulatory Visit: Payer: Self-pay | Admitting: Internal Medicine

## 2019-05-06 ENCOUNTER — Other Ambulatory Visit: Payer: Self-pay | Admitting: Internal Medicine

## 2019-05-07 NOTE — Telephone Encounter (Signed)
Diazepam refill 

## 2019-05-12 ENCOUNTER — Encounter: Payer: BC Managed Care – PPO | Admitting: Internal Medicine

## 2019-05-17 ENCOUNTER — Other Ambulatory Visit: Payer: Self-pay | Admitting: Internal Medicine

## 2019-05-18 ENCOUNTER — Other Ambulatory Visit: Payer: Self-pay

## 2019-05-18 ENCOUNTER — Encounter: Payer: Self-pay | Admitting: Internal Medicine

## 2019-05-18 ENCOUNTER — Ambulatory Visit: Payer: BC Managed Care – PPO | Admitting: Internal Medicine

## 2019-05-18 VITALS — BP 132/94 | HR 105 | Temp 98.5°F | Ht 65.0 in | Wt 227.4 lb

## 2019-05-18 DIAGNOSIS — Z0000 Encounter for general adult medical examination without abnormal findings: Secondary | ICD-10-CM

## 2019-05-18 DIAGNOSIS — N182 Chronic kidney disease, stage 2 (mild): Secondary | ICD-10-CM | POA: Diagnosis not present

## 2019-05-18 DIAGNOSIS — Z23 Encounter for immunization: Secondary | ICD-10-CM

## 2019-05-18 DIAGNOSIS — I10 Essential (primary) hypertension: Secondary | ICD-10-CM

## 2019-05-18 DIAGNOSIS — E1122 Type 2 diabetes mellitus with diabetic chronic kidney disease: Secondary | ICD-10-CM

## 2019-05-18 DIAGNOSIS — Z6837 Body mass index (BMI) 37.0-37.9, adult: Secondary | ICD-10-CM

## 2019-05-18 DIAGNOSIS — I129 Hypertensive chronic kidney disease with stage 1 through stage 4 chronic kidney disease, or unspecified chronic kidney disease: Secondary | ICD-10-CM

## 2019-05-18 LAB — POCT URINALYSIS DIPSTICK
Bilirubin, UA: NEGATIVE
Blood, UA: NEGATIVE
Glucose, UA: POSITIVE — AB
Leukocytes, UA: NEGATIVE
Nitrite, UA: NEGATIVE
Protein, UA: POSITIVE — AB
Spec Grav, UA: >=1.03 — AB (ref 1.010–1.025)
Urobilinogen, UA: 0.2 E.U./dL
pH, UA: 5.5 (ref 5.0–8.0)

## 2019-05-18 LAB — POCT UA - MICROALBUMIN
Albumin/Creatinine Ratio, Urine, POC: >300
Creatinine, POC: 200 mg/dL
Microalbumin Ur, POC: 150 mg/L

## 2019-05-18 NOTE — Progress Notes (Signed)
Subjective:     Patient ID: Brittany Rubio , female    DOB: [birth year] , 58 y.o.   MRN: [id number]   Chief Complaint  Patient presents with  . Annual Exam  . Diabetes  . Hypertension    HPI  She is here today for a full physical examination.  She was previously followed by Dr. Raphael Gibney for her GYN exams; however, he has recently retired. She will be seeing Earnstine Regal, PA in the near future.   Diabetes She presents for her follow-up diabetic visit. She has type 2 diabetes mellitus. Her disease course has been stable. Headaches: she has h/o migraines. needs refill of Stadol NS. Pertinent negatives for diabetes include no blurred vision and no chest pain. There are no hypoglycemic complications. Diabetic complications include nephropathy. Risk factors for coronary artery disease include diabetes mellitus, dyslipidemia, hypertension, obesity, sedentary lifestyle, post-menopausal and stress. Her weight is fluctuating minimally. She is following a diabetic diet. She participates in exercise intermittently. Her home blood glucose trend is increasing steadily. Her breakfast blood glucose is taken between 8-9 am. Her breakfast blood glucose range is generally 140-180 mg/dl. An ACE inhibitor/angiotensin II receptor blocker is being taken. Eye exam is current.  Hypertension This is a chronic problem. The current episode started more than 1 year ago. The problem has been gradually improving since onset. The problem is uncontrolled. Pertinent negatives include no blurred vision, chest pain, palpitations or shortness of breath. Headaches: she has h/o migraines. needs refill of Stadol NS. The current treatment provides moderate improvement. Compliance problems include exercise.  Hypertensive end-organ damage includes kidney disease.     Past Medical History:  Diagnosis Date  . ADD (attention deficit disorder)   . Anemia   . Anxiety   . Arthritis    "left shoulder" (07/07/2014)  . Back pain   .  Breast cancer ([place])    "left"  . Depression   . Fatty liver   . Food allergy    shellfish  . GERD (gastroesophageal reflux disease)    "takes over the counters as needed"  . Hernia, umbilical   . High cholesterol   . Hypertension 11/26/2011   sees Dr. Bryon Lions  . Hypothyroidism   . Joint pain   . Migraines    "maybe once q 3 months" (07/07/2014)  . Multinodular thyroid 2015  . OSA on CPAP   . Personal history of radiation therapy   . Pneumonia    May 2018  . Thyroid cancer ([place])    2015  . Type II diabetes mellitus ([place])   . Vitamin D deficiency      Family History  Problem Relation Age of Onset  . Cancer Mother        Pancreatic  . Diabetes Mother   . Hypertension Mother   . Depression Mother   . Hypertension Father   . Alcoholism Father      Current Outpatient Medications:  .  amphetamine-dextroamphetamine (ADDERALL XR) 20 MG 24 hr capsule, Take 1 capsule (20 mg total) by mouth daily. Take with 30 mg to equal 50 mg daily (Patient taking differently: Take by mouth daily. Take with 30 mg to equal 50 mg daily ), Disp: 30 capsule, Rfl: 0 .  amphetamine-dextroamphetamine (ADDERALL XR) 30 MG 24 hr capsule, Take 30 mg by mouth daily. Take with 20 mg to equal 50 mg daily, Disp: , Rfl:  .  amphetamine-dextroamphetamine (ADDERALL) 10 MG tablet, 1 TABLET BY MOUTH DAILY IN THE AFTERNOON,  Disp: , Rfl:  .  aspirin EC 81 MG tablet, Take 81 mg by mouth daily., Disp: , Rfl:  .  butorphanol (STADOL) 10 MG/ML nasal spray, PLACE 1 SPRAY INTO THE NOSE 3 (THREE) TIMES DAILY AS NEEDED FOR MIGRAINE., Disp: 2.5 mL, Rfl: 1 .  cholecalciferol (VITAMIN D3) 25 MCG (1000 UT) tablet, Take 1,000 Units by mouth daily., Disp: , Rfl:  .  Cyanocobalamin (VITAMIN B-12 PO), Take 1 tablet by mouth once a week. , Disp: , Rfl:  .  diazepam (VALIUM) 5 MG tablet, TAKE 1 TABLET BY MOUTH DAILY AS NEEDED FOR ANXIETY., Disp: 30 tablet, Rfl: 0 .  diltiazem (TIAZAC) 240 MG 24 hr capsule, TAKE 1 TABLET BY MOUTH  AT BEDTIME FOR BP, Disp: 90 capsule, Rfl: 1 .  EPINEPHrine (EPIPEN 2-PAK) 0.3 mg/0.3 mL IJ SOAJ injection, 0.3 mLs by Subdermal route once as needed. Allergic reaction, Disp: , Rfl:  .  FARXIGA 10 MG TABS tablet, TAKE 1 TABLET BY MOUTH EVERY DAY, Disp: 90 tablet, Rfl: 0 .  fexofenadine (ALLEGRA) 180 MG tablet, Take 180 mg by mouth daily as needed for allergies or rhinitis., Disp: , Rfl:  .  glucose blood (ONETOUCH ULTRA) test strip, Test twice daily, Disp: 100 each, Rfl: 0 .  JANUMET 50-1000 MG tablet, TAKE 1 TABLET BY MOUTH TWICE A DAY WITH MEALS, Disp: 180 tablet, Rfl: 1 .  Lancets (ONETOUCH ULTRASOFT) lancets, Test twice daily, Disp: 100 each, Rfl: 0 .  levothyroxine (SYNTHROID, LEVOTHROID) 112 MCG tablet, Take 112 mcg by mouth every morning. Take 2 tabs daily, Disp: , Rfl:  .  lisinopril-hydrochlorothiazide (ZESTORETIC) 20-25 MG tablet, TAKE 1 TABLET BY MOUTH EVERY DAY, Disp: 90 tablet, Rfl: 1 .  Magnesium 500 MG CAPS, Take 500 mg by mouth daily. , Disp: , Rfl:  .  omega-3 acid ethyl esters (LOVAZA) 1 g capsule, Take by mouth 2 (two) times daily., Disp: , Rfl:  .  promethazine (PHENERGAN) 25 MG tablet, Take 25 mg by mouth every 8 (eight) hours as needed for nausea or vomiting. , Disp: , Rfl: 0 .  ranitidine (ZANTAC) 150 MG tablet, Take 150-300 mg by mouth See admin instructions. Take 300 mg in the morning and may take an additional 150 mg at night as needed for heartburn, Disp: , Rfl:  .  traZODone (DESYREL) 50 MG tablet, Take 2 tabs po qhs, Disp: 180 tablet, Rfl: 2 .  Turmeric Curcumin 500 MG CAPS, Take 500 mg by mouth daily. , Disp: , Rfl:  .  VIIBRYD 40 MG TABS, TAKE 1 TABLET BY MOUTH EVERY DAY WITH FOOD, Disp: 90 tablet, Rfl: 1 .  vitamin C (ASCORBIC ACID) 500 MG tablet, Take 500 mg by mouth daily., Disp: , Rfl:  .  esomeprazole (NEXIUM) 20 MG capsule, Take 20 mg by mouth daily as needed (acid reflux). , Disp: , Rfl:    Allergies  Allergen Reactions  . Crab [Shellfish Allergy] Hives and  Swelling    "Only when I eat too many crab legs" Can tolerate CT scans with contrast-does not need premeds       Review of Systems  Constitutional: Negative.   HENT: Negative.   Eyes: Negative.  Negative for blurred vision.  Respiratory: Negative.  Negative for shortness of breath.   Cardiovascular: Negative.  Negative for chest pain and palpitations.  Endocrine: Negative.   Genitourinary: Negative.   Musculoskeletal: Negative.   Skin: Negative.   Allergic/Immunologic: Negative.   Neurological: Negative.  Headaches: she has h/o   migraines. needs refill of Stadol NS.  Hematological: Negative.   Psychiatric/Behavioral: Negative.      Today's Vitals   05/18/19 1604  BP: (!) 132/94  Pulse: (!) 105  Temp: 98.5 F (36.9 C)  TempSrc: Oral  Weight: 227 lb 6.4 oz (103.1 kg)  Height: 5' 5" (1.651 m)   Body mass index is 37.84 kg/m.   Objective:  Physical Exam Vitals signs and nursing note reviewed.  Constitutional:      Appearance: Normal appearance. She is obese.  HENT:     Head: Normocephalic and atraumatic.     Right Ear: Tympanic membrane, ear canal and external ear normal.     Left Ear: Tympanic membrane, ear canal and external ear normal.     Nose: Nose normal.     Mouth/Throat:     Mouth: Mucous membranes are moist.     Pharynx: Oropharynx is clear.  Eyes:     Extraocular Movements: Extraocular movements intact.     Conjunctiva/sclera: Conjunctivae normal.     Pupils: Pupils are equal, round, and reactive to light.  Neck:     Musculoskeletal: Normal range of motion and neck supple.  Cardiovascular:     Rate and Rhythm: Normal rate and regular rhythm.     Pulses: Normal pulses.          Dorsalis pedis pulses are 2+ on the right side and 2+ on the left side.     Heart sounds: Normal heart sounds.  Pulmonary:     Effort: Pulmonary effort is normal.     Breath sounds: Normal breath sounds.  Chest:     Breasts: Tanner Score is 5.        Right: Normal.         Left: Normal.     Comments: Healed surgical scars b/l breasts.  Abdominal:     General: Abdomen is flat. Bowel sounds are normal.     Palpations: Abdomen is soft.  Genitourinary:    Comments: deferred Musculoskeletal: Normal range of motion.  Feet:     Right foot:     Protective Sensation: 5 sites tested. 5 sites sensed.     Skin integrity: Skin integrity normal.     Toenail Condition: Right toenails are normal.     Left foot:     Protective Sensation: 5 sites tested. 5 sites sensed.     Skin integrity: Skin integrity normal.     Toenail Condition: Left toenails are normal.  Skin:    General: Skin is warm and dry.  Neurological:     General: No focal deficit present.     Mental Status: She is alert and oriented to person, place, and time.  Psychiatric:        Mood and Affect: Mood normal.        Behavior: Behavior normal.         Assessment And Plan:    1. Routine general medical examination at health care facility  A full exam was performed.  Importance of monthly self breast exams was discussed with the patient.  PATIENT HAS BEEN ADVISED TO GET 30-45 MINUTES REGULAR EXERCISE NO LESS THAN FOUR TO FIVE DAYS PER WEEK - BOTH WEIGHTBEARING EXERCISES AND AEROBIC ARE RECOMMENDED.  HE/SHE WAS ADVISED TO FOLLOW A HEALTHY DIET WITH AT LEAST SIX FRUITS/VEGGIES PER DAY, DECREASE INTAKE OF RED MEAT, AND TO INCREASE FISH INTAKE TO TWO DAYS PER WEEK.  MEATS/FISH SHOULD NOT BE FRIED, BAKED OR BROILED IS PREFERABLE.  I SUGGEST   WEARING SPF 50 SUNSCREEN ON EXPOSED PARTS AND ESPECIALLY WHEN IN THE DIRECT SUNLIGHT FOR AN EXTENDED PERIOD OF TIME.  PLEASE AVOID FAST FOOD RESTAURANTS AND INCREASE YOUR WATER INTAKE.  - CMP14+EGFR - CBC - Hemoglobin A1c - TSH - T4, Free  2. Type 2 diabetes mellitus with stage 2 chronic kidney disease, without long-term current use of insulin ([facility])  Diabetic foot exam was performed.  I DISCUSSED WITH THE PATIENT AT LENGTH REGARDING THE GOALS OF GLYCEMIC CONTROL AND  POSSIBLE LONG-TERM COMPLICATIONS.  I  ALSO STRESSED THE IMPORTANCE OF COMPLIANCE WITH HOME GLUCOSE MONITORING, DIETARY RESTRICTIONS INCLUDING AVOIDANCE OF SUGARY DRINKS/PROCESSED FOODS,  ALONG WITH REGULAR EXERCISE.  I  ALSO STRESSED THE IMPORTANCE OF ANNUAL EYE EXAMS, SELF FOOT CARE AND COMPLIANCE WITH OFFICE VISITS.  - POCT Urinalysis Dipstick (81002) - POCT UA - Microalbumin  3. Essential hypertension  Chronic, fair control. She is encouraged to avoid adding salt to her foods. EKG performed, no new changes noted. Importance of regular exercise was also discussed with the patient. She will rto in six months for re-evaluation.   - EKG 12-Lead  4. Need for vaccination  - Flu Vaccine QUAD 6+ mos PF IM (Fluarix Quad PF)  5. Class 2 severe obesity due to excess calories with serious comorbidity and body mass index (BMI) of 37.0 to 37.9 in adult ([facility])  Importance of achieving optimal weight to decrease risk of cardiovascular disease and cancers was discussed with the patient in full detail. Importance of regular exercise was discussed with the patient.  She is encouraged to start slowly - start with 10 minutes twice daily at least three to four days per week and to gradually build to 30 minutes five days weekly. She was given tips to incorporate more activity into her daily routine - take stairs when possible, park farther away from her job, grocery stores, etc.     Robyn N Sanders, MD    THE PATIENT IS ENCOURAGED TO PRACTICE SOCIAL DISTANCING DUE TO THE COVID-19 PANDEMIC.   

## 2019-05-18 NOTE — Patient Instructions (Addendum)
Once weekly Ozempic - to help with sugars and help weight loss   Health Maintenance, Female Adopting a healthy lifestyle and getting preventive care are important in promoting health and wellness. Ask your health care provider about:  The right schedule for you to have regular tests and exams.  Things you can do on your own to prevent diseases and keep yourself healthy. What should I know about diet, weight, and exercise? Eat a healthy diet   Eat a diet that includes plenty of vegetables, fruits, low-fat dairy products, and lean protein.  Do not eat a lot of foods that are high in solid fats, added sugars, or sodium. Maintain a healthy weight Body mass index (BMI) is used to identify weight problems. It estimates body fat based on height and weight. Your health care provider can help determine your BMI and help you achieve or maintain a healthy weight. Get regular exercise Get regular exercise. This is one of the most important things you can do for your health. Most adults should:  Exercise for at least 150 minutes each week. The exercise should increase your heart rate and make you sweat (moderate-intensity exercise).  Do strengthening exercises at least twice a week. This is in addition to the moderate-intensity exercise.  Spend less time sitting. Even light physical activity can be beneficial. Watch cholesterol and blood lipids Have your blood tested for lipids and cholesterol at 59 years of age, then have this test every 5 years. Have your cholesterol levels checked more often if:  Your lipid or cholesterol levels are high.  You are older than 59 years of age.  You are at high risk for heart disease. What should I know about cancer screening? Depending on your health history and family history, you may need to have cancer screening at various ages. This may include screening for:  Breast cancer.  Cervical cancer.  Colorectal cancer.  Skin cancer.  Lung cancer. What  should I know about heart disease, diabetes, and high blood pressure? Blood pressure and heart disease  High blood pressure causes heart disease and increases the risk of stroke. This is more likely to develop in people who have high blood pressure readings, are of African descent, or are overweight.  Have your blood pressure checked: ? Every 3-5 years if you are 50-5 years of age. ? Every year if you are 43 years old or older. Diabetes Have regular diabetes screenings. This checks your fasting blood sugar level. Have the screening done:  Once every three years after age 14 if you are at a normal weight and have a low risk for diabetes.  More often and at a younger age if you are overweight or have a high risk for diabetes. What should I know about preventing infection? Hepatitis B If you have a higher risk for hepatitis B, you should be screened for this virus. Talk with your health care provider to find out if you are at risk for hepatitis B infection. Hepatitis C Testing is recommended for:  Everyone born from [birth year] through 1966.  Anyone with known risk factors for hepatitis C. Sexually transmitted infections (STIs)  Get screened for STIs, including gonorrhea and chlamydia, if: ? You are sexually active and are younger than 59 years of age. ? You are older than 59 years of age and your health care provider tells you that you are at risk for this type of infection. ? Your sexual activity has changed since you were last screened, and you  are at increased risk for chlamydia or gonorrhea. Ask your health care provider if you are at risk.  Ask your health care provider about whether you are at high risk for HIV. Your health care provider may recommend a prescription medicine to help prevent HIV infection. If you choose to take medicine to prevent HIV, you should first get tested for HIV. You should then be tested every 3 months for as long as you are taking the medicine. Pregnancy  If  you are about to stop having your period (premenopausal) and you may become pregnant, seek counseling before you get pregnant.  Take 400 to 800 micrograms (mcg) of folic acid every day if you become pregnant.  Ask for birth control (contraception) if you want to prevent pregnancy. Osteoporosis and menopause Osteoporosis is a disease in which the bones lose minerals and strength with aging. This can result in bone fractures. If you are 80 years old or older, or if you are at risk for osteoporosis and fractures, ask your health care provider if you should:  Be screened for bone loss.  Take a calcium or vitamin D supplement to lower your risk of fractures.  Be given hormone replacement therapy (HRT) to treat symptoms of menopause. Follow these instructions at home: Lifestyle  Do not use any products that contain nicotine or tobacco, such as cigarettes, e-cigarettes, and chewing tobacco. If you need help quitting, ask your health care provider.  Do not use street drugs.  Do not share needles.  Ask your health care provider for help if you need support or information about quitting drugs. Alcohol use  Do not drink alcohol if: ? Your health care provider tells you not to drink. ? You are pregnant, may be pregnant, or are planning to become pregnant.  If you drink alcohol: ? Limit how much you use to 0-1 drink a day. ? Limit intake if you are breastfeeding.  Be aware of how much alcohol is in your drink. In the [place] one drink equals one 12 oz bottle of beer (355 mL), one 5 oz glass of wine (148 mL), or one 1 oz glass of hard liquor (44 mL). General instructions  Schedule regular health, dental, and eye exams.  Stay current with your vaccines.  Tell your health care provider if: ? You often feel depressed. ? You have ever been abused or do not feel safe at home. Summary  Adopting a healthy lifestyle and getting preventive care are important in promoting health and wellness.   Follow your health care provider's instructions about healthy diet, exercising, and getting tested or screened for diseases.  Follow your health care provider's instructions on monitoring your cholesterol and blood pressure. This information is not intended to replace advice given to you by your health care provider. Make sure you discuss any questions you have with your health care provider. Document Released: 01/15/2011 Document Revised: 06/25/2018 Document Reviewed: 06/25/2018 Elsevier Patient Education  2020 [employer].

## 2019-05-19 LAB — CBC
Hematocrit: 40.3 % (ref 34.0–46.6)
Hemoglobin: 12.6 g/dL (ref 11.1–15.9)
MCH: 22.6 pg — ABNORMAL LOW (ref 26.6–33.0)
MCHC: 31.3 g/dL — ABNORMAL LOW (ref 31.5–35.7)
MCV: 72 fL — ABNORMAL LOW (ref 79–97)
Platelets: 387 10*3/uL (ref 150–450)
RBC: 5.57 x10E6/uL — ABNORMAL HIGH (ref 3.77–5.28)
RDW: 16.7 % — ABNORMAL HIGH (ref 11.7–15.4)
WBC: 8.2 10*3/uL (ref 3.4–10.8)

## 2019-05-19 LAB — CMP14+EGFR
ALT: 26 IU/L (ref 0–32)
AST: 10 IU/L (ref 0–40)
Albumin/Globulin Ratio: 1.3 (ref 1.2–2.2)
Albumin: 4.2 g/dL (ref 3.8–4.9)
Alkaline Phosphatase: 57 IU/L (ref 39–117)
BUN/Creatinine Ratio: 15 (ref 9–23)
BUN: 17 mg/dL (ref 6–24)
Bilirubin Total: <0.2 mg/dL (ref 0.0–1.2)
CO2: 27 mmol/L (ref 20–29)
Calcium: 9.6 mg/dL (ref 8.7–10.2)
Chloride: 98 mmol/L (ref 96–106)
Creatinine, Ser: 1.14 mg/dL — ABNORMAL HIGH (ref 0.57–1.00)
GFR calc Af Amer: 61 mL/min/{1.73_m2} (ref >59)
GFR calc non Af Amer: 53 mL/min/{1.73_m2} — ABNORMAL LOW (ref >59)
Globulin, Total: 3.2 g/dL (ref 1.5–4.5)
Glucose: 161 mg/dL — ABNORMAL HIGH (ref 65–99)
Potassium: 4.7 mmol/L (ref 3.5–5.2)
Sodium: 139 mmol/L (ref 134–144)
Total Protein: 7.4 g/dL (ref 6.0–8.5)

## 2019-05-19 LAB — T4, FREE: Free T4: 2.22 ng/dL — ABNORMAL HIGH (ref 0.82–1.77)

## 2019-05-19 LAB — HEMOGLOBIN A1C
Est. average glucose Bld gHb Est-mCnc: 203 mg/dL
Hgb A1c MFr Bld: 8.7 % — ABNORMAL HIGH (ref 4.8–5.6)

## 2019-05-19 LAB — TSH: TSH: 6.05 u[IU]/mL — ABNORMAL HIGH (ref 0.450–4.500)

## 2019-05-27 ENCOUNTER — Other Ambulatory Visit: Payer: Self-pay

## 2019-05-27 DIAGNOSIS — Z20822 Contact with and (suspected) exposure to covid-19: Secondary | ICD-10-CM

## 2019-05-29 LAB — NOVEL CORONAVIRUS, NAA: SARS-CoV-2, NAA: DETECTED — AB

## 2019-06-01 ENCOUNTER — Telehealth: Payer: BC Managed Care – PPO | Admitting: Family

## 2019-06-01 ENCOUNTER — Telehealth: Payer: Self-pay

## 2019-06-01 DIAGNOSIS — U071 COVID-19: Secondary | ICD-10-CM

## 2019-06-01 MED ORDER — BENZONATATE 100 MG PO CAPS: 100.0000 mg | ORAL_CAPSULE | Freq: Three times a day (TID) | ORAL | 0 refills | Status: DC | PRN

## 2019-06-01 MED ORDER — ALBUTEROL SULFATE HFA 108 (90 BASE) MCG/ACT IN AERS: 2.0000 | INHALATION_SPRAY | Freq: Four times a day (QID) | RESPIRATORY_TRACT | 0 refills | Status: DC | PRN

## 2019-06-01 NOTE — Telephone Encounter (Signed)
The pt was called to schedule a virtual appt because the pt called and said that she has the coronavirus.

## 2019-06-01 NOTE — Progress Notes (Signed)
E-Visit for Corona Virus Screening   Your current symptoms of COVID. We are enrolling you in our [facility] for [place] . Daily you will receive a questionnaire within the [facility] website. Our COVID 19 response team willl be monitoriing your responses daily. Please continue good preventive care measures, including:  frequent hand-washing, avoid touching your face, cover coughs/sneezes, stay out of crowds and keep a 6 foot distance from others.    COVID-19 is a respiratory illness with symptoms that are similar to the flu. Symptoms are typically mild to moderate, but there have been cases of severe illness and death due to the virus. The following symptoms may appear 2-14 days after exposure: . Fever . Cough . Shortness of breath or difficulty breathing . Chills . Repeated shaking with chills . Muscle pain . Headache . Sore throat . New loss of taste or smell . Fatigue . Congestion or runny nose . Nausea or vomiting . Diarrhea  If you develop fever/cough/breathlessness, please stay home for 10 days with improving symptoms and until you have had 24 hours of no fever (without taking a fever reducer).  Go to the nearest hospital ED for assessment if fever/cough/breathlessness are severe or illness seems like a threat to life.  It is vitally important that if you feel that you have an infection such as this virus or any other virus that you stay home and away from places where you may spread it to others.  You should avoid contact with people age 2 and older.   You should wear a mask or cloth face covering over your nose and mouth if you must be around other people or animals, including pets (even at home). Try to stay at least 6 feet away from other people. This will protect the people around you.  You can use medication such as A prescription cough medication called Tessalon Perles 100 mg. You may take 1-2 capsules every 8 hours as needed for cough and A prescription inhaler called  Albuterol MDI 90 mcg /actuation 2 puffs every 4 hours as needed for shortness of breath, wheezing, cough.  You may also take acetaminophen (Tylenol) as needed for fever.   Reduce your risk of any infection by using the same precautions used for avoiding the common cold or flu:  Marland Kitchen Wash your hands often with soap and warm water for at least 20 seconds.  If soap and water are not readily available, use an alcohol-based hand sanitizer with at least 60% alcohol.  . If coughing or sneezing, cover your mouth and nose by coughing or sneezing into the elbow areas of your shirt or coat, into a tissue or into your sleeve (not your hands). . Avoid shaking hands with others and consider head nods or verbal greetings only. . Avoid touching your eyes, nose, or mouth with unwashed hands.  . Avoid close contact with people who are sick. . Avoid places or events with large numbers of people in one location, like concerts or sporting events. . Carefully consider travel plans you have or are making. . If you are planning any travel outside or inside the [place], visit the CDC's Travelers' Health webpage for the latest health notices. . If you have some symptoms but not all symptoms, continue to monitor at home and seek medical attention if your symptoms worsen. . If you are having a medical emergency, call 911.  HOME CARE . Only take medications as instructed by your medical team. . Drink plenty of fluids  and get plenty of rest. . A steam or ultrasonic humidifier can help if you have congestion.   GET HELP RIGHT AWAY IF YOU HAVE EMERGENCY WARNING SIGNS** FOR COVID-19. If you or someone is showing any of these signs seek emergency medical care immediately. Call 911 or proceed to your closest emergency facility if: . You develop worsening high fever. . Trouble breathing . Bluish lips or face . Persistent pain or pressure in the chest . New confusion . Inability to wake or stay awake . You cough up blood. . Your  symptoms become more severe  **This list is not all possible symptoms. Contact your medical provider for any symptoms that are sever or concerning to you.   MAKE SURE YOU   Understand these instructions.  Will watch your condition.  Will get help right away if you are not doing well or get worse.  Your e-visit answers were reviewed by a board certified advanced clinical practitioner to complete your personal care plan.  Depending on the condition, your plan could have included both over the counter or prescription medications.  If there is a problem please reply once you have received a response from your provider.  Your safety is important to [place].  If you have drug allergies check your prescription carefully.    You can use MyChart to ask questions about today's visit, request a non-urgent call back, or ask for a work or school excuse for 24 hours related to this e-Visit. If it has been greater than 24 hours you will need to follow up with your provider, or enter a new e-Visit to address those concerns. You will get an e-mail in the next two days asking about your experience.  I hope that your e-visit has been valuable and will speed your recovery. Thank you for using e-visits.   Approximately 5 minutes was spent documenting and reviewing patient's chart.

## 2019-06-02 ENCOUNTER — Telehealth (INDEPENDENT_AMBULATORY_CARE_PROVIDER_SITE_OTHER): Payer: BC Managed Care – PPO | Admitting: Internal Medicine

## 2019-06-02 ENCOUNTER — Other Ambulatory Visit: Payer: Self-pay

## 2019-06-02 ENCOUNTER — Encounter: Payer: Self-pay | Admitting: Internal Medicine

## 2019-06-02 VITALS — Temp 99.1°F | Ht 65.0 in

## 2019-06-02 DIAGNOSIS — U071 COVID-19: Secondary | ICD-10-CM | POA: Diagnosis not present

## 2019-06-02 DIAGNOSIS — R42 Dizziness and giddiness: Secondary | ICD-10-CM | POA: Diagnosis not present

## 2019-06-02 DIAGNOSIS — R05 Cough: Secondary | ICD-10-CM | POA: Diagnosis not present

## 2019-06-02 MED ORDER — AZITHROMYCIN 250 MG PO TABS: ORAL_TABLET | ORAL | 0 refills | Status: DC

## 2019-06-02 NOTE — Patient Instructions (Signed)
COVID-19 Frequently Asked Questions °COVID-19 (coronavirus disease) is an infection that is caused by a large family of viruses. Some viruses cause illness in people and others cause illness in animals like camels, cats, and bats. In some cases, the viruses that cause illness in animals can spread to humans. °Where did the coronavirus come from? °In December 2019, [place] told the World Health Organization (WHO) of several cases of lung disease (human respiratory illness). These cases were linked to an open seafood and livestock market in the city of [place]. The link to the seafood and livestock market suggests that the virus may have spread from animals to humans. However, since that first outbreak in December, the virus has also been shown to spread from person to person. °What is the name of the disease and the virus? °Disease name °Early on, this disease was called novel coronavirus. This is because scientists determined that the disease was caused by a new (novel) respiratory virus. The World Health Organization (WHO) has now named the disease COVID-19, or coronavirus disease. °Virus name °The virus that causes the disease is called severe acute respiratory syndrome coronavirus 2 (SARS-CoV-2). °More information on disease and virus naming °World Health Organization (WHO): www.who.int/emergencies/diseases/novel-coronavirus-2019/technical-guidance/naming-the-coronavirus-disease-(covid-2019)-and-the-virus-that-causes-it °Who is at risk for complications from coronavirus disease? °Some people may be at higher risk for complications from coronavirus disease. This includes older adults and people who have chronic diseases, such as heart disease, diabetes, and lung disease. °If you are at higher risk for complications, take these extra precautions: °· Avoid close contact with people who are sick or have a fever or cough. Stay at least 3-6 ft (1-2 m) away from them, if possible. °· Wash your hands often with soap and  water for at least 20 seconds. °· Avoid touching your face, mouth, nose, or eyes. °· Keep supplies on hand at home, such as food, medicine, and cleaning supplies. °· Stay home as much as possible. °· Avoid social gatherings and travel. °How does coronavirus disease spread? °The virus that causes coronavirus disease spreads easily from person to person (is contagious). There are also cases of community-spread disease. This means the disease has spread to: °· People who have no known contact with other infected people. °· People who have not traveled to areas where there are known cases. °It appears to spread from one person to another through droplets from coughing or sneezing. °Can I get the virus from touching surfaces or objects? °There is still a lot that we do not know about the virus that causes coronavirus disease. Scientists are basing a lot of information on what they know about similar viruses, such as: °· Viruses cannot generally survive on surfaces for long. They need a human body (host) to survive. °· It is more likely that the virus is spread by close contact with people who are sick (direct contact), such as through: °? Shaking hands or hugging. °? Breathing in respiratory droplets that travel through the air. This can happen when an infected person coughs or sneezes on or near other people. °· It is less likely that the virus is spread when a person touches a surface or object that has the virus on it (indirect contact). The virus may be able to enter the body if the person touches a surface or object and then touches his or her face, eyes, nose, or mouth. °Can a person spread the virus without having symptoms of the disease? °It may be possible for the virus to spread before a person   has symptoms of the disease, but this is most likely not the main way the virus is spreading. It is more likely for the virus to spread by being in close contact with people who are sick and breathing in the respiratory  droplets of a sick person's cough or sneeze. °What are the symptoms of coronavirus disease? °Symptoms vary from person to person and can range from mild to severe. Symptoms may include: °· Fever. °· Cough. °· Tiredness, weakness, or fatigue. °· Fast breathing or feeling short of breath. °These symptoms can appear anywhere from 2 to 14 days after you have been exposed to the virus. If you develop symptoms, call your health care provider. People with severe symptoms may need hospital care. °If I am exposed to the virus, how long does it take before symptoms start? °Symptoms of coronavirus disease may appear anywhere from 2 to 14 days after a person has been exposed to the virus. If you develop symptoms, call your health care provider. °Should I be tested for this virus? °Your health care provider will decide whether to test you based on your symptoms, history of exposure, and your risk factors. °How does a health care provider test for this virus? °Health care providers will collect samples to send for testing. Samples may include: °· Taking a swab of fluid from the nose. °· Taking fluid from the lungs by having you cough up mucus (sputum) into a sterile cup. °· Taking a blood sample. °· Taking a stool or urine sample. °Is there a treatment or vaccine for this virus? °Currently, there is no vaccine to prevent coronavirus disease. Also, there are no medicines like antibiotics or antivirals to treat the virus. A person who becomes sick is given supportive care, which means rest and fluids. A person may also relieve his or her symptoms by using over-the-counter medicines that treat sneezing, coughing, and runny nose. These are the same medicines that a person takes for the common cold. °If you develop symptoms, call your health care provider. People with severe symptoms may need hospital care. °What can I do to protect myself and my family from this virus? ° °  ° °You can protect yourself and your family by taking the  same actions that you would take to prevent the spread of other viruses. Take the following actions: °· Wash your hands often with soap and water for at least 20 seconds. If soap and water are not available, use alcohol-based hand sanitizer. °· Avoid touching your face, mouth, nose, or eyes. °· Cough or sneeze into a tissue, sleeve, or elbow. Do not cough or sneeze into your hand or the air. °? If you cough or sneeze into a tissue, throw it away immediately and wash your hands. °· Disinfect objects and surfaces that you frequently touch every day. °· Avoid close contact with people who are sick or have a fever or cough. Stay at least 3-6 ft (1-2 m) away from them, if possible. °· Stay home if you are sick, except to get medical care. Call your health care provider before you get medical care. °· Make sure your vaccines are up to date. Ask your health care provider what vaccines you need. °What should I do if I need to travel? °Follow travel recommendations from your local health authority, the CDC, and WHO. °Travel information and advice °· Centers for Disease Control and Prevention (CDC): www.cdc.gov/coronavirus/2019-ncov/travelers/index.html °· World Health Organization (WHO): www.who.int/emergencies/diseases/novel-coronavirus-2019/travel-advice °Know the risks and take action to protect your health °·   You are at higher risk of getting coronavirus disease if you are traveling to areas with an outbreak or if you are exposed to travelers from areas with an outbreak. °· Wash your hands often and practice good hygiene to lower the risk of catching or spreading the virus. °What should I do if I am sick? °General instructions to stop the spread of infection °· Wash your hands often with soap and water for at least 20 seconds. If soap and water are not available, use alcohol-based hand sanitizer. °· Cough or sneeze into a tissue, sleeve, or elbow. Do not cough or sneeze into your hand or the air. °· If you cough or  sneeze into a tissue, throw it away immediately and wash your hands. °· Stay home unless you must get medical care. Call your health care provider or local health authority before you get medical care. °· Avoid public areas. Do not take public transportation, if possible. °· If you can, wear a mask if you must go out of the house or if you are in close contact with someone who is not sick. °Keep your home clean °· Disinfect objects and surfaces that are frequently touched every day. This may include: °? Counters and tables. °? Doorknobs and light switches. °? Sinks and faucets. °? Electronics such as phones, remote controls, keyboards, computers, and tablets. °· Wash dishes in hot, soapy water or use a dishwasher. Air-dry your dishes. °· Wash laundry in hot water. °Prevent infecting other household members °· Let healthy household members care for children and pets, if possible. If you have to care for children or pets, wash your hands often and wear a mask. °· Sleep in a different bedroom or bed, if possible. °· Do not share personal items, such as razors, toothbrushes, deodorant, combs, brushes, towels, and washcloths. °Where to find more information °Centers for Disease Control and Prevention (CDC) °· Information and news updates: www.cdc.gov/coronavirus/2019-ncov °World Health Organization (WHO) °· Information and news updates: www.who.int/emergencies/diseases/novel-coronavirus-2019 °· Coronavirus health topic: www.who.int/health-topics/coronavirus °· Questions and answers on COVID-19: www.who.int/news-room/q-a-detail/q-a-coronaviruses °· Global tracker: who.sprinklr.com °American Academy of Pediatrics (AAP) °· Information for families: www.healthychildren.org/English/health-issues/conditions/chest-lungs/Pages/2019-Novel-Coronavirus.aspx °The coronavirus situation is changing rapidly. Check your local health authority website or the CDC and WHO websites for updates and news. °When should I contact a health care  provider? °· Contact your health care provider if you have symptoms of an infection, such as fever or cough, and you: °? Have been near anyone who is known to have coronavirus disease. °? Have come into contact with a person who is suspected to have coronavirus disease. °? Have traveled outside of the country. °When should I get emergency medical care? °· Get help right away by calling your local emergency services (911 in the [place] if you have: °? Trouble breathing. °? Pain or pressure in your chest. °? Confusion. °? Blue-tinged lips and fingernails. °? Difficulty waking from sleep. °? Symptoms that get worse. °Let the emergency medical personnel know if you think you have coronavirus disease. °Summary °· A new respiratory virus is spreading from person to person and causing COVID-19 (coronavirus disease). °· The virus that causes COVID-19 appears to spread easily. It spreads from one person to another through droplets from coughing or sneezing. °· Older adults and those with chronic diseases are at higher risk of disease. If you are at higher risk for complications, take extra precautions. °· There is currently no vaccine to prevent coronavirus disease. There are no medicines, such as antibiotics or   antivirals, to treat the virus. °· You can protect yourself and your family by washing your hands often, avoiding touching your face, and covering your coughs and sneezes. °This information is not intended to replace advice given to you by your health care provider. Make sure you discuss any questions you have with your health care provider. °Document Released: 10/28/2018 Document Revised: 10/28/2018 Document Reviewed: 10/28/2018 °Elsevier Patient Education © 2020 [employer] ° °

## 2019-06-04 ENCOUNTER — Emergency Department (HOSPITAL_COMMUNITY): Payer: BC Managed Care – PPO

## 2019-06-04 ENCOUNTER — Other Ambulatory Visit: Payer: Self-pay

## 2019-06-04 ENCOUNTER — Encounter (HOSPITAL_COMMUNITY): Payer: Self-pay | Admitting: Internal Medicine

## 2019-06-04 ENCOUNTER — Inpatient Hospital Stay (HOSPITAL_COMMUNITY)
Admission: EM | Admit: 2019-06-04 | Discharge: 2019-06-16 | DRG: 871 | Disposition: A | Discharge status: Skilled Nursing Facility | Payer: BC Managed Care – PPO | Attending: Family Medicine | Admitting: Family Medicine

## 2019-06-04 DIAGNOSIS — F909 Attention-deficit hyperactivity disorder, unspecified type: Secondary | ICD-10-CM | POA: Diagnosis present

## 2019-06-04 DIAGNOSIS — U071 COVID-19: Secondary | ICD-10-CM | POA: Diagnosis present

## 2019-06-04 DIAGNOSIS — Z751 Person awaiting admission to adequate facility elsewhere: Secondary | ICD-10-CM

## 2019-06-04 DIAGNOSIS — A0839 Other viral enteritis: Secondary | ICD-10-CM | POA: Diagnosis present

## 2019-06-04 DIAGNOSIS — Z7984 Long term (current) use of oral hypoglycemic drugs: Secondary | ICD-10-CM

## 2019-06-04 DIAGNOSIS — I82443 Acute embolism and thrombosis of tibial vein, bilateral: Secondary | ICD-10-CM | POA: Diagnosis not present

## 2019-06-04 DIAGNOSIS — R6520 Severe sepsis without septic shock: Secondary | ICD-10-CM | POA: Diagnosis present

## 2019-06-04 DIAGNOSIS — F320 Major depressive disorder, single episode, mild: Secondary | ICD-10-CM | POA: Diagnosis not present

## 2019-06-04 DIAGNOSIS — E1129 Type 2 diabetes mellitus with other diabetic kidney complication: Secondary | ICD-10-CM | POA: Diagnosis not present

## 2019-06-04 DIAGNOSIS — I82462 Acute embolism and thrombosis of left calf muscular vein: Secondary | ICD-10-CM | POA: Diagnosis present

## 2019-06-04 DIAGNOSIS — E662 Morbid (severe) obesity with alveolar hypoventilation: Secondary | ICD-10-CM | POA: Diagnosis present

## 2019-06-04 DIAGNOSIS — T45515A Adverse effect of anticoagulants, initial encounter: Secondary | ICD-10-CM | POA: Diagnosis not present

## 2019-06-04 DIAGNOSIS — R040 Epistaxis: Secondary | ICD-10-CM | POA: Diagnosis not present

## 2019-06-04 DIAGNOSIS — R0602 Shortness of breath: Secondary | ICD-10-CM

## 2019-06-04 DIAGNOSIS — F32A Depression, unspecified: Secondary | ICD-10-CM | POA: Diagnosis present

## 2019-06-04 DIAGNOSIS — J9621 Acute and chronic respiratory failure with hypoxia: Secondary | ICD-10-CM | POA: Diagnosis present

## 2019-06-04 DIAGNOSIS — Z23 Encounter for immunization: Secondary | ICD-10-CM

## 2019-06-04 DIAGNOSIS — F329 Major depressive disorder, single episode, unspecified: Secondary | ICD-10-CM | POA: Diagnosis present

## 2019-06-04 DIAGNOSIS — Y92230 Patient room in hospital as the place of occurrence of the external cause: Secondary | ICD-10-CM | POA: Diagnosis not present

## 2019-06-04 DIAGNOSIS — E1165 Type 2 diabetes mellitus with hyperglycemia: Secondary | ICD-10-CM | POA: Diagnosis not present

## 2019-06-04 DIAGNOSIS — IMO0002 Reserved for concepts with insufficient information to code with codable children: Secondary | ICD-10-CM | POA: Diagnosis present

## 2019-06-04 DIAGNOSIS — J181 Lobar pneumonia, unspecified organism: Secondary | ICD-10-CM | POA: Diagnosis present

## 2019-06-04 DIAGNOSIS — I82461 Acute embolism and thrombosis of right calf muscular vein: Secondary | ICD-10-CM | POA: Diagnosis not present

## 2019-06-04 DIAGNOSIS — I10 Essential (primary) hypertension: Secondary | ICD-10-CM | POA: Diagnosis present

## 2019-06-04 DIAGNOSIS — F900 Attention-deficit hyperactivity disorder, predominantly inattentive type: Secondary | ICD-10-CM | POA: Diagnosis not present

## 2019-06-04 DIAGNOSIS — C73 Malignant neoplasm of thyroid gland: Secondary | ICD-10-CM | POA: Diagnosis not present

## 2019-06-04 DIAGNOSIS — Z8249 Family history of ischemic heart disease and other diseases of the circulatory system: Secondary | ICD-10-CM

## 2019-06-04 DIAGNOSIS — Z90710 Acquired absence of both cervix and uterus: Secondary | ICD-10-CM

## 2019-06-04 DIAGNOSIS — Z9181 History of falling: Secondary | ICD-10-CM

## 2019-06-04 DIAGNOSIS — N179 Acute kidney failure, unspecified: Secondary | ICD-10-CM | POA: Diagnosis present

## 2019-06-04 DIAGNOSIS — J9601 Acute respiratory failure with hypoxia: Secondary | ICD-10-CM | POA: Diagnosis not present

## 2019-06-04 DIAGNOSIS — Z9089 Acquired absence of other organs: Secondary | ICD-10-CM

## 2019-06-04 DIAGNOSIS — E890 Postprocedural hypothyroidism: Secondary | ICD-10-CM | POA: Diagnosis present

## 2019-06-04 DIAGNOSIS — N182 Chronic kidney disease, stage 2 (mild): Secondary | ICD-10-CM | POA: Diagnosis present

## 2019-06-04 DIAGNOSIS — Z7982 Long term (current) use of aspirin: Secondary | ICD-10-CM

## 2019-06-04 DIAGNOSIS — Z9981 Dependence on supplemental oxygen: Secondary | ICD-10-CM

## 2019-06-04 DIAGNOSIS — I071 Rheumatic tricuspid insufficiency: Secondary | ICD-10-CM | POA: Diagnosis present

## 2019-06-04 DIAGNOSIS — G4733 Obstructive sleep apnea (adult) (pediatric): Secondary | ICD-10-CM | POA: Diagnosis not present

## 2019-06-04 DIAGNOSIS — E785 Hyperlipidemia, unspecified: Secondary | ICD-10-CM | POA: Diagnosis present

## 2019-06-04 DIAGNOSIS — T380X5A Adverse effect of glucocorticoids and synthetic analogues, initial encounter: Secondary | ICD-10-CM | POA: Diagnosis not present

## 2019-06-04 DIAGNOSIS — E118 Type 2 diabetes mellitus with unspecified complications: Secondary | ICD-10-CM | POA: Diagnosis not present

## 2019-06-04 DIAGNOSIS — Z79890 Hormone replacement therapy (postmenopausal): Secondary | ICD-10-CM

## 2019-06-04 DIAGNOSIS — K219 Gastro-esophageal reflux disease without esophagitis: Secondary | ICD-10-CM | POA: Diagnosis present

## 2019-06-04 DIAGNOSIS — A419 Sepsis, unspecified organism: Secondary | ICD-10-CM | POA: Diagnosis not present

## 2019-06-04 DIAGNOSIS — A4189 Other specified sepsis: Principal | ICD-10-CM | POA: Diagnosis present

## 2019-06-04 DIAGNOSIS — D6832 Hemorrhagic disorder due to extrinsic circulating anticoagulants: Secondary | ICD-10-CM | POA: Diagnosis not present

## 2019-06-04 DIAGNOSIS — G43109 Migraine with aura, not intractable, without status migrainosus: Secondary | ICD-10-CM | POA: Diagnosis present

## 2019-06-04 DIAGNOSIS — F05 Delirium due to known physiological condition: Secondary | ICD-10-CM | POA: Diagnosis not present

## 2019-06-04 DIAGNOSIS — Z853 Personal history of malignant neoplasm of breast: Secondary | ICD-10-CM

## 2019-06-04 DIAGNOSIS — J1289 Other viral pneumonia: Secondary | ICD-10-CM | POA: Diagnosis present

## 2019-06-04 DIAGNOSIS — R809 Proteinuria, unspecified: Secondary | ICD-10-CM | POA: Diagnosis present

## 2019-06-04 DIAGNOSIS — E876 Hypokalemia: Secondary | ICD-10-CM | POA: Diagnosis not present

## 2019-06-04 DIAGNOSIS — F419 Anxiety disorder, unspecified: Secondary | ICD-10-CM | POA: Diagnosis not present

## 2019-06-04 DIAGNOSIS — F988 Other specified behavioral and emotional disorders with onset usually occurring in childhood and adolescence: Secondary | ICD-10-CM | POA: Diagnosis present

## 2019-06-04 DIAGNOSIS — R7989 Other specified abnormal findings of blood chemistry: Secondary | ICD-10-CM | POA: Diagnosis not present

## 2019-06-04 DIAGNOSIS — Z85850 Personal history of malignant neoplasm of thyroid: Secondary | ICD-10-CM

## 2019-06-04 DIAGNOSIS — Z833 Family history of diabetes mellitus: Secondary | ICD-10-CM

## 2019-06-04 DIAGNOSIS — K760 Fatty (change of) liver, not elsewhere classified: Secondary | ICD-10-CM | POA: Diagnosis present

## 2019-06-04 DIAGNOSIS — J1282 Pneumonia due to coronavirus disease 2019: Secondary | ICD-10-CM | POA: Diagnosis present

## 2019-06-04 DIAGNOSIS — Z79899 Other long term (current) drug therapy: Secondary | ICD-10-CM

## 2019-06-04 DIAGNOSIS — I361 Nonrheumatic tricuspid (valve) insufficiency: Secondary | ICD-10-CM | POA: Diagnosis not present

## 2019-06-04 DIAGNOSIS — Z923 Personal history of irradiation: Secondary | ICD-10-CM

## 2019-06-04 DIAGNOSIS — Z6836 Body mass index (BMI) 36.0-36.9, adult: Secondary | ICD-10-CM

## 2019-06-04 DIAGNOSIS — Z818 Family history of other mental and behavioral disorders: Secondary | ICD-10-CM

## 2019-06-04 DIAGNOSIS — Z9289 Personal history of other medical treatment: Secondary | ICD-10-CM

## 2019-06-04 LAB — CBC
HCT: 38.4 % (ref 36.0–46.0)
Hemoglobin: 12 g/dL (ref 12.0–15.0)
MCH: 22.6 pg — ABNORMAL LOW (ref 26.0–34.0)
MCHC: 31.3 g/dL (ref 30.0–36.0)
MCV: 72.2 fL — ABNORMAL LOW (ref 80.0–100.0)
Platelets: 314 10*3/uL (ref 150–400)
RBC: 5.32 MIL/uL — ABNORMAL HIGH (ref 3.87–5.11)
RDW: 17.5 % — ABNORMAL HIGH (ref 11.5–15.5)
WBC: 11.8 10*3/uL — ABNORMAL HIGH (ref 4.0–10.5)
nRBC: 1.1 % — ABNORMAL HIGH (ref 0.0–0.2)

## 2019-06-04 LAB — COMPREHENSIVE METABOLIC PANEL
ALT: 32 U/L (ref 0–44)
AST: 30 U/L (ref 15–41)
Albumin: 2.9 g/dL — ABNORMAL LOW (ref 3.5–5.0)
Alkaline Phosphatase: 57 U/L (ref 38–126)
Anion gap: 17 — ABNORMAL HIGH (ref 5–15)
BUN: 31 mg/dL — ABNORMAL HIGH (ref 6–20)
CO2: 23 mmol/L (ref 22–32)
Calcium: 8.4 mg/dL — ABNORMAL LOW (ref 8.9–10.3)
Chloride: 97 mmol/L — ABNORMAL LOW (ref 98–111)
Creatinine, Ser: 1.73 mg/dL — ABNORMAL HIGH (ref 0.44–1.00)
GFR calc Af Amer: 37 mL/min — ABNORMAL LOW (ref >60)
GFR calc non Af Amer: 32 mL/min — ABNORMAL LOW (ref >60)
Glucose, Bld: 377 mg/dL — ABNORMAL HIGH (ref 70–99)
Potassium: 4.5 mmol/L (ref 3.5–5.1)
Sodium: 137 mmol/L (ref 135–145)
Total Bilirubin: 0.4 mg/dL (ref 0.3–1.2)
Total Protein: 7.6 g/dL (ref 6.5–8.1)

## 2019-06-04 LAB — POCT I-STAT 7, (LYTES, BLD GAS, ICA,H+H)
Acid-Base Excess: 2 mmol/L (ref 0.0–2.0)
Bicarbonate: 26.1 mmol/L (ref 20.0–28.0)
Calcium, Ion: 1.04 mmol/L — ABNORMAL LOW (ref 1.15–1.40)
HCT: 35 % — ABNORMAL LOW (ref 36.0–46.0)
Hemoglobin: 11.9 g/dL — ABNORMAL LOW (ref 12.0–15.0)
O2 Saturation: 94 %
Patient temperature: 99.2
Potassium: 3.8 mmol/L (ref 3.5–5.1)
Sodium: 132 mmol/L — ABNORMAL LOW (ref 135–145)
TCO2: 27 mmol/L (ref 22–32)
pCO2 arterial: 39.2 mmHg (ref 32.0–48.0)
pH, Arterial: 7.433 (ref 7.350–7.450)
pO2, Arterial: 69 mmHg — ABNORMAL LOW (ref 83.0–108.0)

## 2019-06-04 LAB — D-DIMER, QUANTITATIVE: D-Dimer, Quant: 5.57 ug/mL-FEU — ABNORMAL HIGH (ref 0.00–0.50)

## 2019-06-04 LAB — FERRITIN: Ferritin: 55 ng/mL (ref 11–307)

## 2019-06-04 LAB — C-REACTIVE PROTEIN: CRP: 30.4 mg/dL — ABNORMAL HIGH (ref <1.0)

## 2019-06-04 LAB — FIBRINOGEN: Fibrinogen: 778 mg/dL — ABNORMAL HIGH (ref 210–475)

## 2019-06-04 LAB — LACTATE DEHYDROGENASE: LDH: 395 U/L — ABNORMAL HIGH (ref 98–192)

## 2019-06-04 LAB — PROCALCITONIN: Procalcitonin: 1.11 ng/mL

## 2019-06-04 LAB — CBG MONITORING, ED: Glucose-Capillary: 322 mg/dL — ABNORMAL HIGH (ref 70–99)

## 2019-06-04 LAB — TRIGLYCERIDES: Triglycerides: 158 mg/dL — ABNORMAL HIGH (ref <150)

## 2019-06-04 MED ORDER — DEXAMETHASONE SODIUM PHOSPHATE 10 MG/ML IJ SOLN: 6.0000 mg | INTRAMUSCULAR | Status: DC

## 2019-06-04 MED ORDER — ONDANSETRON HCL 4 MG PO TABS: 4.0000 mg | ORAL_TABLET | Freq: Four times a day (QID) | ORAL | Status: DC | PRN

## 2019-06-04 MED ORDER — ACETAMINOPHEN 650 MG RE SUPP
650.0000 mg | Freq: Four times a day (QID) | RECTAL | Status: DC | PRN
  Administered 2019-06-06: 650 mg via RECTAL
  Filled 2019-06-04 (×2): qty 1

## 2019-06-04 MED ORDER — ONDANSETRON HCL 4 MG/2ML IJ SOLN: 4.0000 mg | Freq: Four times a day (QID) | INTRAMUSCULAR | Status: DC | PRN

## 2019-06-04 MED ORDER — FAMOTIDINE IN NACL 20-0.9 MG/50ML-% IV SOLN
20.0000 mg | Freq: Once | INTRAVENOUS | Status: AC
  Administered 2019-06-04: 20 mg via INTRAVENOUS
  Filled 2019-06-04: qty 50

## 2019-06-04 MED ORDER — ACETAMINOPHEN 325 MG PO TABS
650.0000 mg | ORAL_TABLET | Freq: Four times a day (QID) | ORAL | Status: DC | PRN
  Administered 2019-06-05 – 2019-06-15 (×14): 650 mg via ORAL
  Filled 2019-06-04 (×15): qty 2

## 2019-06-04 MED ORDER — HEPARIN SODIUM (PORCINE) 5000 UNIT/ML IJ SOLN
5000.0000 [IU] | Freq: Three times a day (TID) | INTRAMUSCULAR | Status: DC
  Administered 2019-06-04 – 2019-06-05 (×2): 5000 [IU] via SUBCUTANEOUS
  Filled 2019-06-04 (×2): qty 1

## 2019-06-04 MED ORDER — DEXAMETHASONE SODIUM PHOSPHATE 10 MG/ML IJ SOLN
6.0000 mg | Freq: Once | INTRAMUSCULAR | Status: AC
  Administered 2019-06-04: 6 mg via INTRAVENOUS
  Filled 2019-06-04: qty 1

## 2019-06-04 MED ORDER — INSULIN ASPART 100 UNIT/ML ~~LOC~~ SOLN
0.0000 [IU] | Freq: Four times a day (QID) | SUBCUTANEOUS | Status: DC
  Administered 2019-06-05: 7 [IU] via SUBCUTANEOUS
  Administered 2019-06-05: 5 [IU] via SUBCUTANEOUS

## 2019-06-04 MED ORDER — ASPIRIN EC 81 MG PO TBEC
81.0000 mg | DELAYED_RELEASE_TABLET | Freq: Every day | ORAL | Status: DC
  Administered 2019-06-05 – 2019-06-16 (×12): 81 mg via ORAL
  Filled 2019-06-04 (×12): qty 1

## 2019-06-04 MED ORDER — SODIUM CHLORIDE 0.9% FLUSH
3.0000 mL | Freq: Two times a day (BID) | INTRAVENOUS | Status: DC
  Administered 2019-06-05 – 2019-06-16 (×20): 3 mL via INTRAVENOUS

## 2019-06-04 MED ORDER — ALBUTEROL SULFATE HFA 108 (90 BASE) MCG/ACT IN AERS
1.0000 | INHALATION_SPRAY | RESPIRATORY_TRACT | Status: DC | PRN
  Filled 2019-06-04: qty 6.7

## 2019-06-04 NOTE — ED Triage Notes (Signed)
Pt dx with covid 4 days ago. Over the last two days pt having increased shob and tachypnea.

## 2019-06-04 NOTE — H&P (Signed)
History and Physical    PLEASE NOTE THAT DRAGON DICTATION SOFTWARE WAS USED IN THE CONSTRUCTION OF THIS NOTE.   Brittany Rubio:[id number] DOB: [birth year] DOA: 06/04/2019  PCP: Glendale Chard, MD Patient coming from: Home  I have personally briefly reviewed patient's old medical records in [place]  Chief Complaint: Shortness of breath  HPI: Brittany Rubio is a 58 y.o. female with medical history significant for type 2 diabetes mellitus, obstructive sleep apnea on home CPAP, acquired hypothyroidism, essential hypertension, who was admitted to [facility] on 06/04/2019 with acute hypoxic respiratory failure in the setting of Covid pneumonia at presenting from home to [facility] emergency department complaining of shortness of breath.  The patient reports approximately 10 days of shortness of breath associated with a nonproductive cough.  Within a day or 2 of the initial onset of shortness of breath, the patient underwent outpatient COVID-19 testing on 05/27/2019, which was found to be positive.  As her shortness of breath was mild at that time, she was managed conservatively as an outpatient.  However over the last 2 days, the patient reports a notable worsening of her shortness of breath, prompting her to present to [facility] emergency department this evening for further evaluation.  She notes associated subjective fever in the absence of any objective fever at home, and she also denies any associated chills, full body rigors, or generalized myalgias.  Denies any associated sore throat, nausea, vomiting, abdominal pain, diarrhea, or rash.  She reports that her husband, with whom she lives at home, was also recently diagnosed with COVID-19, and was admitted a few days ago to the [facility] for further evaluation and management of this.  The patient reports that her shortness of breath is not been associated with any orthopnea, PND, or peripheral edema.  She  denies any chest pain, hemoptysis, palpitations, or diaphoresis.  The patient reports that she is a lifelong non-smoker, and denies any known underlying chronic pulmonary conditions.  Denies any baseline supplemental oxygen requirements.   ED Course: Vital signs in the emergency department were notable for the following: Temperature max 99.2; heart rate 1 3-1 11, blood pressure ranged from 103/64-130 7/83; respiratory rate 26-33; initial oxygen saturation upon presenting to the emergency department was verbally conveyed the need to be in the mid 80s on room air, and the patient is now maintaining oxygen saturations in the range of 92 to 94% on 15 L nonrebreather.  The patient reports improvement in the severity of her shortness of breath following initiation of aforementioned supplemental oxygen.  Labs in the ED this evening were notable for the following: CMP notable for creatinine 1.73 relative to most recent prior creatinine data point 1.14 when checked on 05/18/2019.  CBC notable for white blood cell count of 11,800, with no differential available at this time, hemoglobin 12 compared to most recent prior hemoglobin data point of 12.6 on 05/18/2019.  Procalcitonin 1.1.  LDH elevated at 395, ferritin within normal limits at 55, CRP elevated at 30, and fibrinogen found to be elevated at 778.  Chest x-ray showed extensive bilateral patchy pulmonary infiltrates consistent with viral pneumonia, in the absence of any associated effusion or pneumothorax.  EKG showed sinus tachycardia with heart rate 113, no evidence of T wave or ST changes.  While in the ED, the patient received Decadron 6 mg IV x1.  As I confirmed with the flow manager that there are no beds available at the [facility]  [place] at this time, the patient is subsequently admitted to the stepdown unit at [facility] for further evaluation and management of presenting acute hypoxic respiratory failure in the setting of  COVID-19.    Review of Systems: As per HPI otherwise 10 point review of systems negative.   Past Medical History:  Diagnosis Date   ADD (attention deficit disorder)    Anemia    Anxiety    Arthritis    "left shoulder" (07/07/2014)   Back pain    Breast cancer ([place])    "left"   Depression    Fatty liver    Food allergy    shellfish   GERD (gastroesophageal reflux disease)    "takes over the counters as needed"   Hernia, umbilical    High cholesterol    Hypertension 11/26/2011   sees Dr. Bryon Lions   Hypothyroidism    Joint pain    Migraines    "maybe once q 3 months" (07/07/2014)   Multinodular thyroid 2015   OSA on CPAP    Personal history of radiation therapy    Pneumonia    May 2018   Thyroid cancer ([place])    2015   Type II diabetes mellitus ([place])    Vitamin D deficiency     Past Surgical History:  Procedure Laterality Date   ABDOMINAL HYSTERECTOMY  ? 1997   BREAST EXCISIONAL BIOPSY Right 2014   BREAST LUMPECTOMY Left 2009   BREAST LUMPECTOMY Right 07/2012   "not a mastectomy"   BREAST REDUCTION SURGERY Bilateral    DIAPHRAGM SURGERY  1986   "trauma"   [place] N/A 06/05/2018   Procedure: [place];  Surgeon: Erroll Luna, MD;  Location: [place];  Service: General;  Laterality: N/A;   INSERTION OF MESH N/A 06/05/2018   Procedure: INSERTION OF MESH;  Surgeon: Erroll Luna, MD;  Location: [place];  Service: General;  Laterality: N/A;   PARTIAL MASTECTOMY WITH NEEDLE LOCALIZATION  08/12/2012   Procedure: PARTIAL MASTECTOMY WITH NEEDLE LOCALIZATION;  Surgeon: Joyice Faster. Cornett, MD;  Location: [place];  Service: General;  Laterality: Right;   REDUCTION MAMMAPLASTY Bilateral 1995   THYROIDECTOMY Left 07/07/2014   Procedure: LEFT HEMI-THYROIDECTOMY;  Surgeon: Ascencion Dike, MD;  Location: [place];  Service: ENT;  Laterality: Left;   THYROIDECTOMY Right 07/08/2014   Procedure:  Completion THYROIDECTOMY;  Surgeon: Ascencion Dike, MD;  Location: [place];  Service: ENT;  Laterality: Right;   THYROIDECTOMY, PARTIAL Left 07/07/2014   hemi    Social History:  reports that she has never smoked. She has never used smokeless tobacco. She reports current alcohol use. She reports that she does not use drugs.   Allergies  Allergen Reactions   Crab [Shellfish Allergy] Hives and Swelling    "Only when I eat too many crab legs" Can tolerate CT scans with contrast-does not need premeds      Family History  Problem Relation Age of Onset   Cancer Mother        Pancreatic   Diabetes Mother    Hypertension Mother    Depression Mother    Hypertension Father    Alcoholism Father      Prior to Admission medications   Medication Sig Start Date End Date Taking? Authorizing Provider  albuterol (VENTOLIN HFA) 108 (90 Base) MCG/ACT inhaler Inhale 2 puffs into the lungs every 6 (six) hours as needed for wheezing or shortness of breath. 06/01/19  Hawks, Christy A, FNP  amphetamine-dextroamphetamine (ADDERALL XR) 20 MG 24 hr capsule Take 1 capsule (20 mg total) by mouth daily. Take with 30 mg to equal 50 mg daily 11/11/18   Glendale Chard, MD  amphetamine-dextroamphetamine (ADDERALL XR) 30 MG 24 hr capsule Take 30 mg by mouth daily. Take with 20 mg to equal 50 mg daily    [provider]  amphetamine-dextroamphetamine (ADDERALL) 10 MG tablet Take 10 mg by mouth daily in the afternoon.  01/12/19   [provider]  aspirin EC 81 MG tablet Take 81 mg by mouth daily.    [provider]  azithromycin (ZITHROMAX) 250 MG tablet Take 2 tablets (500 mg) on  Day 1,  followed by 1 tablet (250 mg) once daily on Days 2 through 5. 06/02/19 06/07/19  Glendale Chard, MD  benzonatate (TESSALON PERLES) 100 MG capsule Take 1 capsule (100 mg total) by mouth 3 (three) times daily as needed. 06/01/19   Evelina Dun A, FNP  butorphanol (STADOL) 10 MG/ML nasal spray PLACE 1  SPRAY INTO THE NOSE 3 (THREE) TIMES DAILY AS NEEDED FOR MIGRAINE. 04/25/19   Glendale Chard, MD  cholecalciferol (VITAMIN D3) 25 MCG (1000 UT) tablet Take 1,000 Units by mouth daily.    [provider]  Cyanocobalamin (VITAMIN B-12 PO) Take 1 tablet by mouth once a week.     [provider]  diazepam (VALIUM) 5 MG tablet TAKE 1 TABLET BY MOUTH DAILY AS NEEDED FOR ANXIETY. Patient taking differently: Take 5 mg by mouth daily as needed for anxiety.  05/07/19   Glendale Chard, MD  diltiazem (TIAZAC) 240 MG 24 hr capsule TAKE 1 TABLET BY MOUTH AT BEDTIME FOR BP Patient taking differently: Take 240 mg by mouth at bedtime.  02/11/19   Glendale Chard, MD  EPINEPHrine (EPIPEN 2-PAK) 0.3 mg/0.3 mL IJ SOAJ injection 0.3 mLs by Subdermal route once as needed. Allergic reaction 12/07/14   [provider]  esomeprazole (NEXIUM) 20 MG capsule Take 20 mg by mouth daily as needed (acid reflux).     [provider]  FARXIGA 10 MG TABS tablet TAKE 1 TABLET BY MOUTH EVERY DAY Patient taking differently: Take 10 mg by mouth daily.  04/02/19   Glendale Chard, MD  fexofenadine (ALLEGRA) 180 MG tablet Take 180 mg by mouth daily as needed for allergies or rhinitis.    [provider]  glucose blood (ONETOUCH ULTRA) test strip Test twice daily 12/23/18   Jearld Lesch A, DO  JANUMET 50-1000 MG tablet TAKE 1 TABLET BY MOUTH TWICE A DAY WITH MEALS Patient taking differently: Take 1 tablet by mouth 2 (two) times daily with a meal.  05/18/19   Glendale Chard, MD  Lancets [facility] ULTRASOFT) lancets Test twice daily 12/23/18   Jearld Lesch A, DO  levothyroxine (SYNTHROID, LEVOTHROID) 112 MCG tablet Take 112 mcg by mouth every morning. Take 2 tabs daily    [provider]  lisinopril-hydrochlorothiazide (ZESTORETIC) 20-25 MG tablet TAKE 1 TABLET BY MOUTH EVERY DAY Patient taking differently: Take 1 tablet by mouth daily.  03/11/19   Glendale Chard, MD  Magnesium 500 MG CAPS Take 500  mg by mouth daily.     [provider]  omega-3 acid ethyl esters (LOVAZA) 1 g capsule Take by mouth 2 (two) times daily.    [provider]  promethazine (PHENERGAN) 25 MG tablet Take 25 mg by mouth every 8 (eight) hours as needed for nausea or vomiting.  03/13/17   [provider]  ranitidine (ZANTAC) 150 MG tablet Take 150-300 mg by mouth See admin instructions. Take 300 mg in the morning and may take an additional 150 mg at night as needed for heartburn    [provider]  traZODone (DESYREL) 50 MG tablet Take 2 tabs po qhs Patient taking differently: Take 100 mg by mouth at bedtime.  11/11/18   Glendale Chard, MD  Turmeric Curcumin 500 MG CAPS Take 500 mg by mouth daily.     [provider]  VIIBRYD 40 MG TABS TAKE 1 TABLET BY MOUTH EVERY DAY WITH FOOD Patient taking differently: Take 40 mg by mouth daily. with food. 05/04/19   Glendale Chard, MD  vitamin C (ASCORBIC ACID) 500 MG tablet Take 500 mg by mouth daily.    [provider]     Objective    Physical Exam: Vitals:   06/04/19 1904 06/04/19 2100 06/04/19 2115  BP: (!) 145/74 137/83 103/64  Pulse: (!) 111 (!) 103 (!) 106  Resp: 20 (!) 33 (!) 26  Temp: 99.2 F (37.3 C)    TempSrc: Oral    SpO2: 98% 92% 94%    General: appears to be stated age; alert, oriented; appears mildly tachypneic, without significant evidence of increased work of breathing Skin: warm, dry, no rash Head:  AT/[place] Eyes:  PEARL b/l, EOMI Mouth:  Oral mucosa membranes appear moist, normal dentition Neck: supple; trachea midline Heart: Tachycardic, regular; did not appreciate any M/R/G Lungs: Bibasilar Rales, but otherwise CTAB, did not appreciate any wheezes, rhonchi Abdomen: + BS; soft, ND, NT Vascular: 2+ pedal pulses b/l; 2+ radial pulses b/l Extremities: no peripheral edema, no muscle wasting  Labs on Admission: I have personally reviewed following labs and imaging studies  CBC: Recent Labs   Lab 06/04/19 1856 06/04/19 2057  WBC 11.8*  --   HGB 12.0 11.9*  HCT 38.4 35.0*  MCV 72.2*  --   PLT 314  --    Basic Metabolic Panel: Recent Labs  Lab 06/04/19 1918 06/04/19 2057  NA 137 132*  K 4.5 3.8  CL 97*  --   CO2 23  --   GLUCOSE 377*  --   BUN 31*  --   CREATININE 1.73*  --   CALCIUM 8.4*  --    GFR: CrCl cannot be calculated (Unknown ideal weight.). Liver Function Tests: Recent Labs  Lab 06/04/19 1918  AST 30  ALT 32  ALKPHOS 57  BILITOT 0.4  PROT 7.6  ALBUMIN 2.9*   No results for input(s): LIPASE, AMYLASE in the last 168 hours. No results for input(s): AMMONIA in the last 168 hours. Coagulation Profile: No results for input(s): INR, PROTIME in the last 168 hours. Cardiac Enzymes: No results for input(s): CKTOTAL, CKMB, CKMBINDEX, TROPONINI in the last 168 hours. BNP (last 3 results) No results for input(s): PROBNP in the last 8760 hours. HbA1C: No results for input(s): HGBA1C in the last 72 hours. CBG: No results for input(s): GLUCAP in the last 168 hours. Lipid Profile: Recent Labs    06/04/19 1900  TRIG 158*   Thyroid Function Tests: No results for input(s): TSH, T4TOTAL, FREET4, T3FREE, THYROIDAB in the last 72 hours. Anemia Panel: Recent Labs    06/04/19 1918  FERRITIN 55   Urine analysis:    Component Value Date/Time   BILIRUBINUR Negative 05/18/2019 1704   PROTEINUR Positive (A) 05/18/2019 1704   UROBILINOGEN 0.2 05/18/2019 1704   NITRITE Negative 05/18/2019 1704   LEUKOCYTESUR Negative  05/18/2019 1704    Radiological Exams on Admission: Xr Chest Portable  Result Date: 06/04/2019 CLINICAL DATA:  Shortness of breath.  Coronavirus infection. EXAM: PORTABLE CHEST 1 VIEW COMPARISON:  08/19/2018 FINDINGS: Heart size upper limits of normal. Extensive bilateral patchy pulmonary infiltrates consistent with viral pneumonia. No dense consolidation, collapse or effusion. No significant bone finding. IMPRESSION: Extensive patchy  bilateral pulmonary infiltrates consistent with viral pneumonia. Electronically Signed   By: Nelson Chimes M.D.   On: 06/04/2019 19:12    EKG: Independently reviewed, with results as described above.    Assessment/Plan    KARENANN MCGRORY is a 58 y.o. female with medical history significant for type 2 diabetes mellitus, obstructive sleep apnea on home CPAP, acquired hypothyroidism, essential hypertension, who was admitted to [facility] on 06/04/2019 with acute hypoxic respiratory failure in the setting of Covid pneumonia at presenting from home to [facility] emergency department complaining of shortness of breath.  Principal Problem:   Pneumonia due to COVID-19 virus Active Problems:   Hypertension   AKI (acute kidney injury) ([place])   Type 2 diabetes mellitus with microalbuminuria, without long-term current use of insulin (HCC)   Acute on chronic respiratory failure with hypoxia (HCC)   Severe sepsis (HCC)  #) Acute hypoxic respiratory failure due to COVID-19: Diagnosis on the basis of 10 days of progressive shortness of breath associated with nonproductive cough and subjective fever, with positive outpatient COVID-19 test on 05/27/2019 and known Covid positive exposure the form of her husband, who has recently been hospitalized for COVID-19; in the setting of no known chronic underlying pulmonary conditions and no baseline supplemental oxygen requirements, the patient has been requiring 15 L nonrebreather over the last 4 hours in order to maintain oxygen saturations greater than or equal to 92%, thereby meeting criteria to be considered acute hypoxic respiratory failure.  Of note, presenting chest x-ray concerning evidence of extensive bilateral patchy pulmonary infiltrates, appears consistent with viral pneumonia. no evidence of ACS at this time, as the patient denies any chest discomfort while presenting EKG shows no evidence of acute ischemic changes.  Clinically no evidence of  heart failure. While less likely, Acute PE is in the differential as well.  Given concomitant presenting hypoxia, patient's COVID-19 pneumonia meets criteria to be considered severe in nature, thereby meeting a class IIc recommendation for initiation of Decadron, which has been initiated in the ED this evening.  On the same Primis, there is also a class IIc recommendation for initiation of remdesivir, which will subsequently be considered.  Of note, the patient reports that she would be amenable to intubation, if subsequent indication for this arose.  Plan: Continue Decadron 6 mg IV every 24 hours.  Consider initiation of remdesivir, per rationale above.  Repeat inflammatory markers in the morning.  Check ABG now for the purpose of evaluating PaO2 to FiO2 ratio.  I have also ordered repeat ABG for further monitoring of history should occur in the morning.  Monitor continuous pulse oximetry.  As needed supplemental oxygen order to maintain oxygen saturations greater than or equal to 92%.  Monitor on telemetry.  As needed albuterol inhaler.  As needed incentive for fever.  Airborne and contact precautions without reaction.  Repeat CBC with differential tomorrow.    #) Severe sepsis: SIRS criteria met with presenting for cytosis as well as tachycardia, with COVID-19 pneumonia as the underlying source of infection, as further described above.  No evidence of additional source of underlying infection at  this time, although urinalysis is currently pending.  Given the viral nature of patient's presenting infection, will refrain from initiation of antibiotics.  Patient sepsis meets criteria to be considered severe nature on the basis of concomitant hypoxia as well as acute kidney injury, serving as evidence of associated endorgan damage.  No evidence of associated hypotension thus far.  Plan: Work-up and management of Covid 19 pneumonia, as further described above.  Check stat lactic acid now, as well as repeat  value in 3 hours from now.  Check blood cultures x2.  Repeat CBC with differential in the morning.  As needed acetaminophen for development of any subsequent fever.  Check urinalysis.     #) Acute kidney injury: Presenting creatinine noted to be 1.73 relative to most recent prior creatinine data point of 1.14 when checked on 05/18/2019.  Previous prerenal in nature in the setting of increased capillary permeability due to inflammatory mediators associated with sepsis.  Not associate with any hypotension thus far.  Will be conservative and administration of additional IV fluids given concern for subsequent development of ARDS, which would entail leaving the patient on the dry side of a fluid balance.  Potential exacerbation from outpatient ACE inhibitor.  Plan: Work-up and management of severe sepsis due to presenting COVID-19 manner, as above.  Monitor strict I's and O's and daily weights, and attempt to avoid nephrotoxic agents.  Repeat BMP in the morning.  Hold home ACE inhibitor.  Check urinalysis, with attention for presence of urinary casts, will also check random urine sodium as well as random urine creatinine.     #) Type 2 diabetes mellitus: Not on insulin as an outpatient, but rather is on dapagliflozin as well as Janumet.  Presenting blood sugar per BMP noted to be elevated greater than 300, which the patient reports would be atypical for her, and suspected to be on the basis of physiologic stress stemming from presenting severe sepsis, as above.  Plan: Hold home dapagliflozin and gentamicin.  I have ordered Accu-Cheks every 6 hours with associated sliding scale insulin.     #) History of essential hypertension: Outpatient antihypertensive regimen includes diltiazem, lisinopril, and HCTZ no evidence of hypotension since presentation to the ED this evening.  However, in the context of presenting severe sepsis, will hold home antihypertensive agents for now.  Plan: Hold home diltiazem ,  lisinopril, and HCTZ for now in the setting of presenting severe sepsis.  Close monitoring of ensuing blood pressures with monitoring of routine vital signs.     #) ADHD: On Adderall as an outpatient.  Plan: We will hold home Adderall for now as the cognition of dopamine and norepinephrine are associated with a relative pro inflammatory state.   DVT prophylaxis: Heparin 5000 units subcu 3 times daily Code Status: Full code Family Communication: None Disposition Plan:  Per Rounding Team Consults called: None Admission status: Inpatient; stepdown unit    PLEASE NOTE THAT DRAGON DICTATION SOFTWARE WAS USED IN THE CONSTRUCTION OF THIS NOTE.   [place] [facility] Pager [phone] From [place].   Otherwise, please contact night-coverage  [email or web address] Password [facility]  06/04/2019, 9:54 PM

## 2019-06-04 NOTE — ED Notes (Addendum)
Pt tachypenic with rhonchi. Attempted to get SPO2 on ear, and several fingers still reading in 30s. Applied nonrebreather and respirations slowed and no reading 86. Provider aware.

## 2019-06-04 NOTE — ED Provider Notes (Addendum)
[facility] EMERGENCY DEPARTMENT Provider Note   CSN: [id number] Arrival date & time: 06/04/19  1845     History   Chief Complaint Chief Complaint  Patient presents with   Shortness of Breath    HPI Brittany Rubio is a 58 y.o. female.     Patient with hx htn, presents with covid+ infection and increasing sob in past 1-2 days. Symptoms (mainly non prod cough) acute onset approximately 9-10 days ago, episodic, moderate, persistent, worse today. Had covid test done 8 days ago that was positive. In past 1-2 days, increasing sob, especially w exertion, but now even at rest. No chest pain or discomfort. No palpitations. No pleuritic pain. No leg pain or swelling. Denies fever today. No sore throat. Spouse currently admitted w covid.   The history is provided by the patient and the EMS personnel.  Shortness of Breath Associated symptoms: cough and fever   Associated symptoms: no abdominal pain, no chest pain, no headaches, no neck pain, no rash, no sore throat and no vomiting     Past Medical History:  Diagnosis Date   ADD (attention deficit disorder)    Anemia    Anxiety    Arthritis    "left shoulder" (07/07/2014)   Back pain    Breast cancer ([place])    "left"   Depression    Fatty liver    Food allergy    shellfish   GERD (gastroesophageal reflux disease)    "takes over the counters as needed"   Hernia, umbilical    High cholesterol    Hypertension 11/26/2011   sees Dr. Bryon Lions   Hypothyroidism    Joint pain    Migraines    "maybe once q 3 months" (07/07/2014)   Multinodular thyroid 2015   OSA on CPAP    Personal history of radiation therapy    Pneumonia    May 2018   Thyroid cancer ([place])    2015   Type II diabetes mellitus ([place])    Vitamin D deficiency     Patient Active Problem List   Diagnosis Date Noted   Class 2 severe obesity with serious comorbidity and body mass index (BMI) of 37.0 to 37.9 in adult  ([place]) 11/17/2018   Incisional hernia without obstruction or gangrene 06/05/2018   Nephropathy 05/05/2018   Pneumonia and influenza 01/28/2017   Cancer of thyroid gland ([place]) 01/28/2017   Severe obstructive sleep apnea-hypopnea syndrome 01/28/2017   Comorbid sleep-related hypoventilation 01/28/2017   Community acquired pneumonia of right upper lobe of lung    Sepsis due to Streptococcus pneumoniae ([place])    AKI (acute kidney injury) ([place]) 11/26/2016   Hyperlipidemia 11/26/2016   GERD (gastroesophageal reflux disease) 11/26/2016   Lobar pneumonia ([place])    Type 2 diabetes mellitus with microalbuminuria, without long-term current use of insulin ([place])    Septic shock ([place])    S/P partial thyroidectomy 07/07/2014   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 1997   BREAST EXCISIONAL BIOPSY Right 2014   BREAST LUMPECTOMY Left 2009   BREAST LUMPECTOMY Right 07/2012   "not a mastectomy"   BREAST REDUCTION SURGERY Bilateral    DIAPHRAGM SURGERY  1986   "trauma"   [place] N/A 06/05/2018   Procedure: [place];  Surgeon: Erroll Luna, MD;  Location: [facility];  Service: General;  Laterality: N/A;   INSERTION OF MESH N/A 06/05/2018   Procedure: INSERTION OF MESH;  Surgeon: Erroll Luna, MD;  Location: [facility];  Service: General;  Laterality: N/A;   PARTIAL MASTECTOMY WITH NEEDLE LOCALIZATION  08/12/2012   Procedure: PARTIAL MASTECTOMY WITH NEEDLE LOCALIZATION;  Surgeon: Joyice Faster. Cornett, MD;  Location: [facility];  Service: General;  Laterality: Right;   REDUCTION MAMMAPLASTY Bilateral 1995   THYROIDECTOMY Left 07/07/2014   Procedure: LEFT HEMI-THYROIDECTOMY;  Surgeon: Ascencion Dike, MD;  Location: [facility];  Service: ENT;  Laterality: Left;   THYROIDECTOMY  Right 07/08/2014   Procedure: Completion THYROIDECTOMY;  Surgeon: Ascencion Dike, MD;  Location: [facility];  Service: ENT;  Laterality: Right;   THYROIDECTOMY, PARTIAL Left 07/07/2014   hemi     OB History    Gravida  0   Para  0   Term  0   Preterm  0   AB  0   Living  0     SAB  0   TAB  0   Ectopic  0   Multiple  0   Live Births  0            Home Medications    Prior to Admission medications   Medication Sig Start Date End Date Taking? Authorizing Provider  albuterol (VENTOLIN HFA) 108 (90 Base) MCG/ACT inhaler Inhale 2 puffs into the lungs every 6 (six) hours as needed for wheezing or shortness of breath. 06/01/19   Evelina Dun A, FNP  amphetamine-dextroamphetamine (ADDERALL XR) 20 MG 24 hr capsule Take 1 capsule (20 mg total) by mouth daily. Take with 30 mg to equal 50 mg daily Patient taking differently: Take by mouth daily. Take with 30 mg to equal 50 mg daily  11/11/18   [place]endale Chard, MD  amphetamine-dextroamphetamine (ADDERALL XR) 30 MG 24 hr capsule Take 30 mg by mouth daily. Take with 20 mg to equal 50 mg daily    [provider]  amphetamine-dextroamphetamine (ADDERALL) 10 MG tablet 1 TABLET BY MOUTH DAILY IN THE AFTERNOON 01/12/19   [provider]  aspirin EC 81 MG tablet Take 81 mg by mouth daily.    [provider]  azithromycin (ZITHROMAX) 250 MG tablet Take 2 tablets (500 mg) on  Day 1,  followed by 1 tablet (250 mg) once daily on Days 2 through 5. 06/02/19 06/07/19  [place] Chard, MD  benzonatate (TESSALON PERLES) 100 MG capsule Take 1 capsule (100 mg total) by mouth 3 (three) times daily as needed. 06/01/19   Evelina Dun A, FNP  butorphanol (STADOL) 10 MG/ML nasal spray PLACE 1 SPRAY INTO THE NOSE 3 (THREE) TIMES DAILY AS NEEDED FOR MIGRAINE. 04/25/19   [place] Chard, MD  cholecalciferol (VITAMIN D3) 25 MCG (1000 UT) tablet Take 1,000 Units by mouth daily.    [provider]  Cyanocobalamin (VITAMIN B-12 PO)  Take 1 tablet by mouth once a week.     [provider]  diazepam (VALIUM) 5 MG tablet TAKE 1 TABLET BY MOUTH DAILY AS NEEDED FOR ANXIETY. 05/07/19   [place] Chard, MD  diltiazem (TIAZAC) 240 MG 24 hr capsule TAKE 1 TABLET BY MOUTH AT BEDTIME FOR BP 02/11/19   [place] Chard, MD  EPINEPHrine (EPIPEN 2-PAK) 0.3 mg/0.3 mL IJ SOAJ injection 0.3 mLs by Subdermal route once as needed. Allergic reaction 12/07/14   [provider]  esomeprazole (NEXIUM) 20 MG capsule Take 20 mg by mouth  daily as needed (acid reflux).     [provider]  FARXIGA 10 MG TABS tablet TAKE 1 TABLET BY MOUTH EVERY DAY 04/02/19   [place] Chard, MD  fexofenadine (ALLEGRA) 180 MG tablet Take 180 mg by mouth daily as needed for allergies or rhinitis.    [provider]  glucose blood (ONETOUCH ULTRA) test strip Test twice daily 12/23/18   Jearld Lesch A, DO  JANUMET 50-1000 MG tablet TAKE 1 TABLET BY MOUTH TWICE A DAY WITH MEALS 05/18/19   [place] Chard, MD  Lancets [facility] ULTRASOFT) lancets Test twice daily 12/23/18   Jearld Lesch A, DO  levothyroxine (SYNTHROID, LEVOTHROID) 112 MCG tablet Take 112 mcg by mouth every morning. Take 2 tabs daily    [provider]  lisinopril-hydrochlorothiazide (ZESTORETIC) 20-25 MG tablet TAKE 1 TABLET BY MOUTH EVERY DAY 03/11/19   [place] Chard, MD  Magnesium 500 MG CAPS Take 500 mg by mouth daily.     [provider]  omega-3 acid ethyl esters (LOVAZA) 1 g capsule Take by mouth 2 (two) times daily.    [provider]  promethazine (PHENERGAN) 25 MG tablet Take 25 mg by mouth every 8 (eight) hours as needed for nausea or vomiting.  03/13/17   [provider]  ranitidine (ZANTAC) 150 MG tablet Take 150-300 mg by mouth See admin instructions. Take 300 mg in the morning and may take an additional 150 mg at night as needed for heartburn    [provider]  traZODone (DESYREL) 50 MG tablet Take 2 tabs po qhs 11/11/18   [place]endale Chard, MD  Turmeric Curcumin 500 MG CAPS Take 500 mg by mouth daily.     [provider]  VIIBRYD 40 MG TABS TAKE 1 TABLET BY MOUTH EVERY DAY WITH FOOD 05/04/19   Glendale Chard, MD  vitamin C (ASCORBIC ACID) 500 MG tablet Take 500 mg by mouth daily.    [provider]    Family History Family History  Problem Relation Age of Onset   Cancer Mother        Pancreatic   Diabetes Mother    Hypertension Mother    Depression Mother    Hypertension Father    Alcoholism Father     Social History Social History   Tobacco Use   Smoking status: Never Smoker   Smokeless tobacco: Never Used  Substance Use Topics   Alcohol use: Yes    Comment: 07/07/2014 "a few times/year; weddings, anniversary, etc"   Drug use: No     Allergies   Crab [shellfish allergy]   Review of Systems Review of Systems  Constitutional: Positive for fever.  HENT: Negative for sore throat.   Eyes: Negative for redness.  Respiratory: Positive for cough and shortness of breath.   Cardiovascular: Negative for chest pain.  Gastrointestinal: Negative for abdominal pain, diarrhea and vomiting.  Genitourinary: Negative for flank pain.  Musculoskeletal: Negative for neck pain and neck stiffness.  Skin: Negative for rash.  Neurological: Negative for headaches.  Hematological: Does not bruise/bleed easily.  Psychiatric/Behavioral: Negative for confusion.     Physical Exam Updated Vital Signs BP (!) 145/74 (BP Location: Right Arm)    Pulse (!) 111    Temp 99.2 F (37.3 C) (Oral)    Resp 20    SpO2 98%   Physical Exam Vitals signs and nursing note reviewed.  Constitutional:      Appearance: Normal appearance. She is well-developed.  HENT:     Head:  Atraumatic.     Nose: Nose normal.     Mouth/Throat:     Mouth: Mucous membranes are moist.  Eyes:     General: No scleral icterus.    Conjunctiva/sclera: Conjunctivae normal.  Neck:     Musculoskeletal: Normal range of motion  and neck supple. No neck rigidity or muscular tenderness.     Trachea: No tracheal deviation.  Cardiovascular:     Rate and Rhythm: Regular rhythm. Tachycardia present.     Pulses: Normal pulses.     Heart sounds: Normal heart sounds. No murmur. No friction rub. No gallop.   Pulmonary:     Breath sounds: Rhonchi present.     Comments: Tachypneic.  Abdominal:     General: Bowel sounds are normal. There is no distension.     Palpations: Abdomen is soft.     Tenderness: There is no abdominal tenderness. There is no guarding.  Genitourinary:    Comments: No cva tenderness.  Musculoskeletal:        General: No swelling or tenderness.     Right lower leg: No edema.     Left lower leg: No edema.  Skin:    General: Skin is warm and dry.     Findings: No rash.  Neurological:     Mental Status: She is alert.     Comments: Alert, speech normal.   Psychiatric:        Mood and Affect: Mood normal.      ED Treatments / Results  Labs (all labs ordered are listed, but only abnormal results are displayed) Results for orders placed or performed during the hospital encounter of 06/04/19  Culture, blood (routine x 2)   Specimen: BLOOD RIGHT HAND  Result Value Ref Range   Specimen Description BLOOD RIGHT HAND    Special Requests      BOTTLES DRAWN AEROBIC AND ANAEROBIC Blood Culture adequate volume   Culture      NO GROWTH 5 DAYS Performed at [facility], [place] [address] [place], [place] [address]    Report Status 06/10/2019 FINAL   Culture, blood (routine x 2)   Specimen: BLOOD LEFT FOREARM  Result Value Ref Range   Specimen Description BLOOD LEFT FOREARM    Special Requests      BOTTLES DRAWN AEROBIC AND ANAEROBIC Blood Culture adequate volume   Culture      NO GROWTH 5 DAYS Performed at [facility], [place] [address], [place], [place] [address]    Report Status 06/10/2019 FINAL   Culture, Urine   Specimen: Urine, Random  Result Value Ref Range   Specimen  Description URINE, RANDOM    Special Requests NONE    Culture (A)     <10,000 COLONIES/mL INSIGNIFICANT GROWTH Performed at [facility] [address] [place], [place] [address]    Report Status 06/06/2019 FINAL   MRSA PCR Screening   Specimen: Nasal Mucosa; Nasopharyngeal  Result Value Ref Range   MRSA by PCR NEGATIVE NEGATIVE  CBC  Result Value Ref Range   WBC 11.8 (H) 4.0 - 10.5 K/uL   RBC 5.32 (H) 3.87 - 5.11 MIL/uL   Hemoglobin 12.0 12.0 - 15.0 g/dL   HCT 38.4 36.0 - 46.0 %   MCV 72.2 (L) 80.0 - 100.0 fL   MCH 22.6 (L) 26.0 - 34.0 pg   MCHC 31.3 30.0 - 36.0 g/dL   RDW 17.5 (H) 11.5 - 15.5 %   Platelets 314 150 -  400 K/uL   nRBC 1.1 (H) 0.0 - 0.2 %  CMET  Result Value Ref Range   Sodium 137 135 - 145 mmol/L   Potassium 4.5 3.5 - 5.1 mmol/L   Chloride 97 (L) 98 - 111 mmol/L   CO2 23 22 - 32 mmol/L   Glucose, Bld 377 (H) 70 - 99 mg/dL   BUN 31 (H) 6 - 20 mg/dL   Creatinine, Ser 1.73 (H) 0.44 - 1.00 mg/dL   Calcium 8.4 (L) 8.9 - 10.3 mg/dL   Total Protein 7.6 6.5 - 8.1 g/dL   Albumin 2.9 (L) 3.5 - 5.0 g/dL   AST 30 15 - 41 U/L   ALT 32 0 - 44 U/L   Alkaline Phosphatase 57 38 - 126 U/L   Total Bilirubin 0.4 0.3 - 1.2 mg/dL   GFR calc non Af Amer 32 (L) >60 mL/min   GFR calc Af Amer 37 (L) >60 mL/min   Anion gap 17 (H) 5 - 15  Procalcitonin  Result Value Ref Range   Procalcitonin 1.11 ng/mL  Lactate dehydrogenase  Result Value Ref Range   LDH 395 (H) 98 - 192 U/L  Ferritin  Result Value Ref Range   Ferritin 55 11 - 307 ng/mL  Triglycerides  Result Value Ref Range   Triglycerides 158 (H) <150 mg/dL  Fibrinogen  Result Value Ref Range   Fibrinogen 778 (H) 210 - 475 mg/dL  C-reactive protein  Result Value Ref Range   CRP 30.4 (H) <1.0 mg/dL  D-dimer  Result Value Ref Range   D-Dimer, Quant 5.57 (H) 0.00 - 0.50 ug/mL-FEU  Magnesium  Result Value Ref Range   Magnesium 2.6 (H) 1.7 - 2.4 mg/dL  HIV Antibody (routine testing w rflx)  Result Value Ref  Range   HIV Screen 4th Generation wRfx NON REACTIVE NON REACTIVE  Comprehensive metabolic panel  Result Value Ref Range   Sodium 134 (L) 135 - 145 mmol/L   Potassium 4.5 3.5 - 5.1 mmol/L   Chloride 94 (L) 98 - 111 mmol/L   CO2 22 22 - 32 mmol/L   Glucose, Bld 317 (H) 70 - 99 mg/dL   BUN 25 (H) 6 - 20 mg/dL   Creatinine, Ser 1.24 (H) 0.44 - 1.00 mg/dL   Calcium 8.0 (L) 8.9 - 10.3 mg/dL   Total Protein 7.1 6.5 - 8.1 g/dL   Albumin 2.7 (L) 3.5 - 5.0 g/dL   AST 30 15 - 41 U/L   ALT 29 0 - 44 U/L   Alkaline Phosphatase 67 38 - 126 U/L   Total Bilirubin 0.6 0.3 - 1.2 mg/dL   GFR calc non Af Amer 48 (L) >60 mL/min   GFR calc Af Amer 55 (L) >60 mL/min   Anion gap 18 (H) 5 - 15  Magnesium  Result Value Ref Range   Magnesium 2.4 1.7 - 2.4 mg/dL  Phosphorus  Result Value Ref Range   Phosphorus 3.2 2.5 - 4.6 mg/dL  CBC WITH DIFFERENTIAL  Result Value Ref Range   WBC 11.2 (H) 4.0 - 10.5 K/uL   RBC 5.41 (H) 3.87 - 5.11 MIL/uL   Hemoglobin 12.1 12.0 - 15.0 g/dL   HCT 38.7 36.0 - 46.0 %   MCV 71.5 (L) 80.0 - 100.0 fL   MCH 22.4 (L) 26.0 - 34.0 pg   MCHC 31.3 30.0 - 36.0 g/dL   RDW 17.2 (H) 11.5 - 15.5 %   Platelets 212 150 - 400 K/uL   nRBC 1.4 (  H) 0.0 - 0.2 %   Neutrophils Relative % 83 %   Neutro Abs 9.3 (H) 1.7 - 7.7 K/uL   Lymphocytes Relative 6 %   Lymphs Abs 0.7 0.7 - 4.0 K/uL   Monocytes Relative 6 %   Monocytes Absolute 0.6 0.1 - 1.0 K/uL   Eosinophils Relative 0 %   Eosinophils Absolute 0.0 0.0 - 0.5 K/uL   Basophils Relative 1 %   Basophils Absolute 0.1 0.0 - 0.1 K/uL   Immature Granulocytes 4 %   Abs Immature Granulocytes 0.41 (H) 0.00 - 0.07 K/uL  Ferritin  Result Value Ref Range   Ferritin 47 11 - 307 ng/mL  C-reactive protein  Result Value Ref Range   CRP 29.7 (H) <1.0 mg/dL  Urinalysis, Complete w Microscopic  Result Value Ref Range   Color, Urine YELLOW YELLOW   APPearance CLEAR CLEAR   Specific Gravity, Urine 1.025 1.005 - 1.030   pH 5.0 5.0 - 8.0    Glucose, UA >=500 (A) NEGATIVE mg/dL   Hgb urine dipstick NEGATIVE NEGATIVE   Bilirubin Urine NEGATIVE NEGATIVE   Ketones, ur 5 (A) NEGATIVE mg/dL   Protein, ur 100 (A) NEGATIVE mg/dL   Nitrite NEGATIVE NEGATIVE   Leukocytes,Ua MODERATE (A) NEGATIVE   RBC / HPF 0-5 0 - 5 RBC/hpf   WBC, UA 11-20 0 - 5 WBC/hpf   Bacteria, UA RARE (A) NONE SEEN   Squamous Epithelial / LPF 0-5 0 - 5   Mucus PRESENT   Sodium, urine, random  Result Value Ref Range   Sodium, Ur 27 mmol/L  Creatinine, urine, random  Result Value Ref Range   Creatinine, Urine 63.54 mg/dL  Lactic acid, plasma  Result Value Ref Range   Lactic Acid, Venous 1.5 0.5 - 1.9 mmol/L  Legionella Urine Antigen  Result Value Ref Range   L. pneumophila Serogp 1 Ur Ag Negative Negative   Source of Sample URINE, RANDOM   Strep pneumoniae urinary antigen  Result Value Ref Range   Strep Pneumo Urinary Antigen NEGATIVE NEGATIVE  D-dimer, quantitative (not at [facility])  Result Value Ref Range   D-Dimer, Quant >20.00 (H) 0.00 - 0.50 ug/mL-FEU  Fibrinogen  Result Value Ref Range   Fibrinogen 725 (H) 210 - 475 mg/dL  CBC with Differential/Platelet  Result Value Ref Range   WBC 15.8 (H) 4.0 - 10.5 K/uL   RBC 5.35 (H) 3.87 - 5.11 MIL/uL   Hemoglobin 11.8 (L) 12.0 - 15.0 g/dL   HCT 38.5 36.0 - 46.0 %   MCV 72.0 (L) 80.0 - 100.0 fL   MCH 22.1 (L) 26.0 - 34.0 pg   MCHC 30.6 30.0 - 36.0 g/dL   RDW 17.4 (H) 11.5 - 15.5 %   Platelets 262 150 - 400 K/uL   nRBC 1.8 (H) 0.0 - 0.2 %   Neutrophils Relative % 81 %   Neutro Abs 12.7 (H) 1.7 - 7.7 K/uL   Lymphocytes Relative 8 %   Lymphs Abs 1.3 0.7 - 4.0 K/uL   Monocytes Relative 8 %   Monocytes Absolute 1.3 (H) 0.1 - 1.0 K/uL   Eosinophils Relative 0 %   Eosinophils Absolute 0.0 0.0 - 0.5 K/uL   Basophils Relative 0 %   Basophils Absolute 0.0 0.0 - 0.1 K/uL   Immature Granulocytes 3 %   Abs Immature Granulocytes 0.54 (H) 0.00 - 0.07 K/uL  Comprehensive metabolic panel  Result Value Ref  Range   Sodium 140 135 - 145 mmol/L   Potassium  4.3 3.5 - 5.1 mmol/L   Chloride 98 98 - 111 mmol/L   CO2 26 22 - 32 mmol/L   Glucose, Bld 118 (H) 70 - 99 mg/dL   BUN 18 6 - 20 mg/dL   Creatinine, Ser 0.85 0.44 - 1.00 mg/dL   Calcium 8.1 (L) 8.9 - 10.3 mg/dL   Total Protein 7.1 6.5 - 8.1 g/dL   Albumin 2.6 (L) 3.5 - 5.0 g/dL   AST 36 15 - 41 U/L   ALT 25 0 - 44 U/L   Alkaline Phosphatase 117 38 - 126 U/L   Total Bilirubin 0.6 0.3 - 1.2 mg/dL   GFR calc non Af Amer >60 >60 mL/min   GFR calc Af Amer >60 >60 mL/min   Anion gap 16 (H) 5 - 15  C-reactive protein  Result Value Ref Range   CRP 20.2 (H) <1.0 mg/dL  D-dimer, quantitative (not at [facility])  Result Value Ref Range   D-Dimer, Quant >20.00 (H) 0.00 - 0.50 ug/mL-FEU  Ferritin  Result Value Ref Range   Ferritin 43 11 - 307 ng/mL  Magnesium  Result Value Ref Range   Magnesium 2.0 1.7 - 2.4 mg/dL  Glucose, capillary  Result Value Ref Range   Glucose-Capillary 200 (H) 70 - 99 mg/dL  Glucose, capillary  Result Value Ref Range   Glucose-Capillary 168 (H) 70 - 99 mg/dL  Heparin level (unfractionated)  Result Value Ref Range   Heparin Unfractionated 0.45 0.30 - 0.70 IU/mL  Glucose, capillary  Result Value Ref Range   Glucose-Capillary 133 (H) 70 - 99 mg/dL  Heparin level  Result Value Ref Range   Heparin Unfractionated 0.29 (L) 0.30 - 0.70 IU/mL  Glucose, capillary  Result Value Ref Range   Glucose-Capillary 85 70 - 99 mg/dL  Glucose, capillary  Result Value Ref Range   Glucose-Capillary 113 (H) 70 - 99 mg/dL  Glucose, capillary  Result Value Ref Range   Glucose-Capillary 173 (H) 70 - 99 mg/dL  CBC  Result Value Ref Range   WBC 14.3 (H) 4.0 - 10.5 K/uL   RBC 5.49 (H) 3.87 - 5.11 MIL/uL   Hemoglobin 12.2 12.0 - 15.0 g/dL   HCT 39.8 36.0 - 46.0 %   MCV 72.5 (L) 80.0 - 100.0 fL   MCH 22.2 (L) 26.0 - 34.0 pg   MCHC 30.7 30.0 - 36.0 g/dL   RDW 17.3 (H) 11.5 - 15.5 %   Platelets 289 150 - 400 K/uL   nRBC 2.1 (H) 0.0 -  0.2 %  Heparin level (unfractionated)  Result Value Ref Range   Heparin Unfractionated 0.39 0.30 - 0.70 IU/mL  BMET AM  Result Value Ref Range   Sodium 141 135 - 145 mmol/L   Potassium 4.2 3.5 - 5.1 mmol/L   Chloride 100 98 - 111 mmol/L   CO2 27 22 - 32 mmol/L   Glucose, Bld 95 70 - 99 mg/dL   BUN 24 (H) 6 - 20 mg/dL   Creatinine, Ser 0.80 0.44 - 1.00 mg/dL   Calcium 8.2 (L) 8.9 - 10.3 mg/dL   GFR calc non Af Amer >60 >60 mL/min   GFR calc Af Amer >60 >60 mL/min   Anion gap 14 5 - 15  MAG AM  Result Value Ref Range   Magnesium 1.9 1.7 - 2.4 mg/dL  Phosphorus AM  Result Value Ref Range   Phosphorus 2.2 (L) 2.5 - 4.6 mg/dL  Glucose, capillary  Result Value Ref Range   Glucose-Capillary 167 (  H) 70 - 99 mg/dL   Comment 1 Notify RN   Glucose, capillary  Result Value Ref Range   Glucose-Capillary 140 (H) 70 - 99 mg/dL  Glucose, capillary  Result Value Ref Range   Glucose-Capillary 94 70 - 99 mg/dL  Glucose, capillary  Result Value Ref Range   Glucose-Capillary 89 70 - 99 mg/dL  Glucose, capillary  Result Value Ref Range   Glucose-Capillary 108 (H) 70 - 99 mg/dL  Glucose, capillary  Result Value Ref Range   Glucose-Capillary 126 (H) 70 - 99 mg/dL  CBC  Result Value Ref Range   WBC 12.8 (H) 4.0 - 10.5 K/uL   RBC 5.87 (H) 3.87 - 5.11 MIL/uL   Hemoglobin 13.0 12.0 - 15.0 g/dL   HCT 42.1 36.0 - 46.0 %   MCV 71.7 (L) 80.0 - 100.0 fL   MCH 22.1 (L) 26.0 - 34.0 pg   MCHC 30.9 30.0 - 36.0 g/dL   RDW 17.2 (H) 11.5 - 15.5 %   Platelets 358 150 - 400 K/uL   nRBC 1.5 (H) 0.0 - 0.2 %  Heparin level (unfractionated)  Result Value Ref Range   Heparin Unfractionated 1.04 (H) 0.30 - 0.70 IU/mL  BMET AM  Result Value Ref Range   Sodium 136 135 - 145 mmol/L   Potassium 3.4 (L) 3.5 - 5.1 mmol/L   Chloride 95 (L) 98 - 111 mmol/L   CO2 27 22 - 32 mmol/L   Glucose, Bld 88 70 - 99 mg/dL   BUN 33 (H) 6 - 20 mg/dL   Creatinine, Ser 1.38 (H) 0.44 - 1.00 mg/dL   Calcium 8.3 (L) 8.9 -  10.3 mg/dL   GFR calc non Af Amer 42 (L) >60 mL/min   GFR calc Af Amer 48 (L) >60 mL/min   Anion gap 14 5 - 15  MAG AM  Result Value Ref Range   Magnesium 1.9 1.7 - 2.4 mg/dL  Phosphorus AM  Result Value Ref Range   Phosphorus 3.5 2.5 - 4.6 mg/dL  Glucose, capillary  Result Value Ref Range   Glucose-Capillary 157 (H) 70 - 99 mg/dL   Comment 1 Notify RN   Glucose, capillary  Result Value Ref Range   Glucose-Capillary 94 70 - 99 mg/dL  Glucose, capillary  Result Value Ref Range   Glucose-Capillary 87 70 - 99 mg/dL  Glucose, capillary  Result Value Ref Range   Glucose-Capillary 78 70 - 99 mg/dL  Heparin level (unfractionated)  Result Value Ref Range   Heparin Unfractionated 0.24 (L) 0.30 - 0.70 IU/mL  Glucose, capillary  Result Value Ref Range   Glucose-Capillary 139 (H) 70 - 99 mg/dL  Heparin level (unfractionated)  Result Value Ref Range   Heparin Unfractionated 0.67 0.30 - 0.70 IU/mL  Glucose, capillary  Result Value Ref Range   Glucose-Capillary 269 (H) 70 - 99 mg/dL  Glucose, capillary  Result Value Ref Range   Glucose-Capillary 228 (H) 70 - 99 mg/dL  Glucose, capillary  Result Value Ref Range   Glucose-Capillary 166 (H) 70 - 99 mg/dL  CBC  Result Value Ref Range   WBC 15.7 (H) 4.0 - 10.5 K/uL   RBC 5.58 (H) 3.87 - 5.11 MIL/uL   Hemoglobin 12.2 12.0 - 15.0 g/dL   HCT 40.2 36.0 - 46.0 %   MCV 72.0 (L) 80.0 - 100.0 fL   MCH 21.9 (L) 26.0 - 34.0 pg   MCHC 30.3 30.0 - 36.0 g/dL   RDW 17.2 (H) 11.5 - 15.5 %  Platelets 423 (H) 150 - 400 K/uL   nRBC 0.3 (H) 0.0 - 0.2 %  AM Labs bmp  Result Value Ref Range   Sodium 138 135 - 145 mmol/L   Potassium 3.8 3.5 - 5.1 mmol/L   Chloride 94 (L) 98 - 111 mmol/L   CO2 30 22 - 32 mmol/L   Glucose, Bld 99 70 - 99 mg/dL   BUN 66 (H) 6 - 20 mg/dL   Creatinine, Ser 1.62 (H) 0.44 - 1.00 mg/dL   Calcium 8.4 (L) 8.9 - 10.3 mg/dL   GFR calc non Af Amer 34 (L) >60 mL/min   GFR calc Af Amer 40 (L) >60 mL/min   Anion gap 14 5 -  15  Magnesium  Result Value Ref Range   Magnesium 2.2 1.7 - 2.4 mg/dL  D-dimer, quantitative (not at [facility])  Result Value Ref Range   D-Dimer, Quant 8.50 (H) 0.00 - 0.50 ug/mL-FEU  Brain natriuretic peptide  Result Value Ref Range   B Natriuretic Peptide 25.4 0.0 - 100.0 pg/mL  Procalcitonin - Baseline  Result Value Ref Range   Procalcitonin 0.40 ng/mL  C-reactive protein  Result Value Ref Range   CRP 12.1 (H) <1.0 mg/dL  Heparin level (unfractionated)  Result Value Ref Range   Heparin Unfractionated 0.70 0.30 - 0.70 IU/mL  Glucose, capillary  Result Value Ref Range   Glucose-Capillary 129 (H) 70 - 99 mg/dL  Glucose, capillary  Result Value Ref Range   Glucose-Capillary 111 (H) 70 - 99 mg/dL  Heparin level (unfractionated)  Result Value Ref Range   Heparin Unfractionated 0.57 0.30 - 0.70 IU/mL  Glucose, capillary  Result Value Ref Range   Glucose-Capillary 191 (H) 70 - 99 mg/dL  Glucose, capillary  Result Value Ref Range   Glucose-Capillary 395 (H) 70 - 99 mg/dL  Heparin level (unfractionated)  Result Value Ref Range   Heparin Unfractionated 0.70 0.30 - 0.70 IU/mL  Procalcitonin  Result Value Ref Range   Procalcitonin 0.32 ng/mL  Comprehensive metabolic panel  Result Value Ref Range   Sodium 136 135 - 145 mmol/L   Potassium 3.7 3.5 - 5.1 mmol/L   Chloride 96 (L) 98 - 111 mmol/L   CO2 27 22 - 32 mmol/L   Glucose, Bld 69 (L) 70 - 99 mg/dL   BUN 60 (H) 6 - 20 mg/dL   Creatinine, Ser 1.17 (H) 0.44 - 1.00 mg/dL   Calcium 8.9 8.9 - 10.3 mg/dL   Total Protein 7.5 6.5 - 8.1 g/dL   Albumin 2.8 (L) 3.5 - 5.0 g/dL   AST 27 15 - 41 U/L   ALT 26 0 - 44 U/L   Alkaline Phosphatase 79 38 - 126 U/L   Total Bilirubin 0.5 0.3 - 1.2 mg/dL   GFR calc non Af Amer 51 (L) >60 mL/min   GFR calc Af Amer 59 (L) >60 mL/min   Anion gap 13 5 - 15  CBC with Differential/Platelet  Result Value Ref Range   WBC 15.6 (H) 4.0 - 10.5 K/uL   RBC 5.50 (H) 3.87 - 5.11 MIL/uL   Hemoglobin 12.1  12.0 - 15.0 g/dL   HCT 39.5 36.0 - 46.0 %   MCV 71.8 (L) 80.0 - 100.0 fL   MCH 22.0 (L) 26.0 - 34.0 pg   MCHC 30.6 30.0 - 36.0 g/dL   RDW 16.9 (H) 11.5 - 15.5 %   Platelets 470 (H) 150 - 400 K/uL   nRBC 0.1 0.0 - 0.2 %  Neutrophils Relative % 67 %   Neutro Abs 10.5 (H) 1.7 - 7.7 K/uL   Lymphocytes Relative 16 %   Lymphs Abs 2.5 0.7 - 4.0 K/uL   Monocytes Relative 8 %   Monocytes Absolute 1.3 (H) 0.1 - 1.0 K/uL   Eosinophils Relative 3 %   Eosinophils Absolute 0.4 0.0 - 0.5 K/uL   Basophils Relative 1 %   Basophils Absolute 0.1 0.0 - 0.1 K/uL   Immature Granulocytes 5 %   Abs Immature Granulocytes 0.83 (H) 0.00 - 0.07 K/uL  BNP daily  Result Value Ref Range   B Natriuretic Peptide 18.0 0.0 - 100.0 pg/mL  Magnesium  Result Value Ref Range   Magnesium 2.3 1.7 - 2.4 mg/dL  D-dimer, quantitative (not at [facility])  Result Value Ref Range   D-Dimer, Quant 5.86 (H) 0.00 - 0.50 ug/mL-FEU  C-reactive protein  Result Value Ref Range   CRP 7.4 (H) <1.0 mg/dL  Glucose, capillary  Result Value Ref Range   Glucose-Capillary 295 (H) 70 - 99 mg/dL  Glucose, capillary  Result Value Ref Range   Glucose-Capillary 101 (H) 70 - 99 mg/dL  Heparin level (unfractionated)  Result Value Ref Range   Heparin Unfractionated 0.53 0.30 - 0.70 IU/mL  Glucose, capillary  Result Value Ref Range   Glucose-Capillary 65 (L) 70 - 99 mg/dL  Glucose, capillary  Result Value Ref Range   Glucose-Capillary 73 70 - 99 mg/dL  Glucose, capillary  Result Value Ref Range   Glucose-Capillary 78 70 - 99 mg/dL  Glucose, capillary  Result Value Ref Range   Glucose-Capillary 173 (H) 70 - 99 mg/dL  Glucose, capillary  Result Value Ref Range   Glucose-Capillary 243 (H) 70 - 99 mg/dL  Glucose, capillary  Result Value Ref Range   Glucose-Capillary 218 (H) 70 - 99 mg/dL  Heparin level (unfractionated)  Result Value Ref Range   Heparin Unfractionated 0.67 0.30 - 0.70 IU/mL  Procalcitonin  Result Value Ref Range    Procalcitonin 0.26 ng/mL  Comprehensive metabolic panel  Result Value Ref Range   Sodium 138 135 - 145 mmol/L   Potassium 3.4 (L) 3.5 - 5.1 mmol/L   Chloride 99 98 - 111 mmol/L   CO2 27 22 - 32 mmol/L   Glucose, Bld 87 70 - 99 mg/dL   BUN 46 (H) 6 - 20 mg/dL   Creatinine, Ser 1.16 (H) 0.44 - 1.00 mg/dL   Calcium 9.1 8.9 - 10.3 mg/dL   Total Protein 6.7 6.5 - 8.1 g/dL   Albumin 2.5 (L) 3.5 - 5.0 g/dL   AST 22 15 - 41 U/L   ALT 25 0 - 44 U/L   Alkaline Phosphatase 76 38 - 126 U/L   Total Bilirubin 0.3 0.3 - 1.2 mg/dL   GFR calc non Af Amer 51 (L) >60 mL/min   GFR calc Af Amer 60 (L) >60 mL/min   Anion gap 12 5 - 15  CBC with Differential/Platelet  Result Value Ref Range   WBC 14.4 (H) 4.0 - 10.5 K/uL   RBC 5.00 3.87 - 5.11 MIL/uL   Hemoglobin 11.1 (L) 12.0 - 15.0 g/dL   HCT 35.8 (L) 36.0 - 46.0 %   MCV 71.6 (L) 80.0 - 100.0 fL   MCH 22.2 (L) 26.0 - 34.0 pg   MCHC 31.0 30.0 - 36.0 g/dL   RDW 17.1 (H) 11.5 - 15.5 %   Platelets 482 (H) 150 - 400 K/uL   nRBC 0.0 0.0 - 0.2 %  Neutrophils Relative % 65 %   Neutro Abs 9.4 (H) 1.7 - 7.7 K/uL   Lymphocytes Relative 14 %   Lymphs Abs 2.1 0.7 - 4.0 K/uL   Monocytes Relative 12 %   Monocytes Absolute 1.7 (H) 0.1 - 1.0 K/uL   Eosinophils Relative 3 %   Eosinophils Absolute 0.4 0.0 - 0.5 K/uL   Basophils Relative 1 %   Basophils Absolute 0.1 0.0 - 0.1 K/uL   Immature Granulocytes 5 %   Abs Immature Granulocytes 0.70 (H) 0.00 - 0.07 K/uL  BNP daily  Result Value Ref Range   B Natriuretic Peptide 22.1 0.0 - 100.0 pg/mL  Magnesium  Result Value Ref Range   Magnesium 1.8 1.7 - 2.4 mg/dL  C-reactive protein  Result Value Ref Range   CRP 4.1 (H) <1.0 mg/dL  Glucose, capillary  Result Value Ref Range   Glucose-Capillary 298 (H) 70 - 99 mg/dL  Glucose, capillary  Result Value Ref Range   Glucose-Capillary 126 (H) 70 - 99 mg/dL  Glucose, capillary  Result Value Ref Range   Glucose-Capillary 95 70 - 99 mg/dL  D-dimer,  quantitative (not at [facility])  Result Value Ref Range   D-Dimer, Quant 3.31 (H) 0.00 - 0.50 ug/mL-FEU  Glucose, capillary  Result Value Ref Range   Glucose-Capillary 124 (H) 70 - 99 mg/dL  Glucose, capillary  Result Value Ref Range   Glucose-Capillary 174 (H) 70 - 99 mg/dL  Comprehensive metabolic panel  Result Value Ref Range   Sodium 139 135 - 145 mmol/L   Potassium 4.2 3.5 - 5.1 mmol/L   Chloride 103 98 - 111 mmol/L   CO2 28 22 - 32 mmol/L   Glucose, Bld 92 70 - 99 mg/dL   BUN 38 (H) 6 - 20 mg/dL   Creatinine, Ser 1.04 (H) 0.44 - 1.00 mg/dL   Calcium 9.0 8.9 - 10.3 mg/dL   Total Protein 6.7 6.5 - 8.1 g/dL   Albumin 2.4 (L) 3.5 - 5.0 g/dL   AST 15 15 - 41 U/L   ALT 20 0 - 44 U/L   Alkaline Phosphatase 59 38 - 126 U/L   Total Bilirubin 0.4 0.3 - 1.2 mg/dL   GFR calc non Af Amer 59 (L) >60 mL/min   GFR calc Af Amer >60 >60 mL/min   Anion gap 8 5 - 15  CBC with Differential/Platelet  Result Value Ref Range   WBC 10.9 (H) 4.0 - 10.5 K/uL   RBC 4.72 3.87 - 5.11 MIL/uL   Hemoglobin 10.5 (L) 12.0 - 15.0 g/dL   HCT 34.2 (L) 36.0 - 46.0 %   MCV 72.5 (L) 80.0 - 100.0 fL   MCH 22.2 (L) 26.0 - 34.0 pg   MCHC 30.7 30.0 - 36.0 g/dL   RDW 17.2 (H) 11.5 - 15.5 %   Platelets 449 (H) 150 - 400 K/uL   nRBC 0.2 0.0 - 0.2 %   Neutrophils Relative % 62 %   Neutro Abs 6.8 1.7 - 7.7 K/uL   Lymphocytes Relative 16 %   Lymphs Abs 1.8 0.7 - 4.0 K/uL   Monocytes Relative 13 %   Monocytes Absolute 1.5 (H) 0.1 - 1.0 K/uL   Eosinophils Relative 3 %   Eosinophils Absolute 0.3 0.0 - 0.5 K/uL   Basophils Relative 1 %   Basophils Absolute 0.1 0.0 - 0.1 K/uL   Immature Granulocytes 5 %   Abs Immature Granulocytes 0.49 (H) 0.00 - 0.07 K/uL  BNP daily  Result Value  Ref Range   B Natriuretic Peptide 34.1 0.0 - 100.0 pg/mL  Magnesium  Result Value Ref Range   Magnesium 2.0 1.7 - 2.4 mg/dL  D-dimer, quantitative (not at [facility])  Result Value Ref Range   D-Dimer, Quant 3.20 (H) 0.00 - 0.50 ug/mL-FEU    C-reactive protein  Result Value Ref Range   CRP 6.7 (H) <1.0 mg/dL  Phosphorus  Result Value Ref Range   Phosphorus 3.2 2.5 - 4.6 mg/dL  Glucose, capillary  Result Value Ref Range   Glucose-Capillary 169 (H) 70 - 99 mg/dL  Glucose, capillary  Result Value Ref Range   Glucose-Capillary 191 (H) 70 - 99 mg/dL  Glucose, capillary  Result Value Ref Range   Glucose-Capillary 231 (H) 70 - 99 mg/dL  Glucose, capillary  Result Value Ref Range   Glucose-Capillary 96 70 - 99 mg/dL  Glucose, capillary  Result Value Ref Range   Glucose-Capillary 98 70 - 99 mg/dL  Glucose, capillary  Result Value Ref Range   Glucose-Capillary 262 (H) 70 - 99 mg/dL  Glucose, capillary  Result Value Ref Range   Glucose-Capillary 263 (H) 70 - 99 mg/dL  Comprehensive metabolic panel  Result Value Ref Range   Sodium 138 135 - 145 mmol/L   Potassium 4.4 3.5 - 5.1 mmol/L   Chloride 101 98 - 111 mmol/L   CO2 26 22 - 32 mmol/L   Glucose, Bld 84 70 - 99 mg/dL   BUN 36 (H) 6 - 20 mg/dL   Creatinine, Ser 1.14 (H) 0.44 - 1.00 mg/dL   Calcium 9.4 8.9 - 10.3 mg/dL   Total Protein 7.0 6.5 - 8.1 g/dL   Albumin 2.6 (L) 3.5 - 5.0 g/dL   AST 18 15 - 41 U/L   ALT 20 0 - 44 U/L   Alkaline Phosphatase 57 38 - 126 U/L   Total Bilirubin 0.4 0.3 - 1.2 mg/dL   GFR calc non Af Amer 53 (L) >60 mL/min   GFR calc Af Amer >60 >60 mL/min   Anion gap 11 5 - 15  CBC with Differential/Platelet  Result Value Ref Range   WBC 12.3 (H) 4.0 - 10.5 K/uL   RBC 4.88 3.87 - 5.11 MIL/uL   Hemoglobin 10.8 (L) 12.0 - 15.0 g/dL   HCT 35.8 (L) 36.0 - 46.0 %   MCV 73.4 (L) 80.0 - 100.0 fL   MCH 22.1 (L) 26.0 - 34.0 pg   MCHC 30.2 30.0 - 36.0 g/dL   RDW 17.3 (H) 11.5 - 15.5 %   Platelets 521 (H) 150 - 400 K/uL   nRBC 0.2 0.0 - 0.2 %   Neutrophils Relative % 67 %   Neutro Abs 8.3 (H) 1.7 - 7.7 K/uL   Lymphocytes Relative 14 %   Lymphs Abs 1.8 0.7 - 4.0 K/uL   Monocytes Relative 12 %   Monocytes Absolute 1.5 (H) 0.1 - 1.0 K/uL    Eosinophils Relative 2 %   Eosinophils Absolute 0.2 0.0 - 0.5 K/uL   Basophils Relative 1 %   Basophils Absolute 0.1 0.0 - 0.1 K/uL   Immature Granulocytes 4 %   Abs Immature Granulocytes 0.54 (H) 0.00 - 0.07 K/uL  BNP daily  Result Value Ref Range   B Natriuretic Peptide 21.1 0.0 - 100.0 pg/mL  Magnesium  Result Value Ref Range   Magnesium 2.0 1.7 - 2.4 mg/dL  D-dimer, quantitative (not at [facility])  Result Value Ref Range   D-Dimer, Quant 3.29 (H) 0.00 - 0.50 ug/mL-FEU  C-reactive protein  Result Value Ref Range   CRP 3.3 (H) <1.0 mg/dL  Phosphorus  Result Value Ref Range   Phosphorus 3.9 2.5 - 4.6 mg/dL  Glucose, capillary  Result Value Ref Range   Glucose-Capillary 171 (H) 70 - 99 mg/dL  Glucose, capillary  Result Value Ref Range   Glucose-Capillary 169 (H) 70 - 99 mg/dL  Glucose, capillary  Result Value Ref Range   Glucose-Capillary 98 70 - 99 mg/dL  Glucose, capillary  Result Value Ref Range   Glucose-Capillary 85 70 - 99 mg/dL  Glucose, capillary  Result Value Ref Range   Glucose-Capillary 102 (H) 70 - 99 mg/dL  Glucose, capillary  Result Value Ref Range   Glucose-Capillary 178 (H) 70 - 99 mg/dL  Glucose, capillary  Result Value Ref Range   Glucose-Capillary 293 (H) 70 - 99 mg/dL  Phosphorus  Result Value Ref Range   Phosphorus 3.9 2.5 - 4.6 mg/dL  Glucose, capillary  Result Value Ref Range   Glucose-Capillary 192 (H) 70 - 99 mg/dL  Glucose, capillary  Result Value Ref Range   Glucose-Capillary 91 70 - 99 mg/dL  Glucose, capillary  Result Value Ref Range   Glucose-Capillary 81 70 - 99 mg/dL  Glucose, capillary  Result Value Ref Range   Glucose-Capillary 96 70 - 99 mg/dL  Glucose, capillary  Result Value Ref Range   Glucose-Capillary 115 (H) 70 - 99 mg/dL  Glucose, capillary  Result Value Ref Range   Glucose-Capillary 288 (H) 70 - 99 mg/dL  Phosphorus  Result Value Ref Range   Phosphorus 3.4 2.5 - 4.6 mg/dL  D-dimer, quantitative (not at [facility])    Result Value Ref Range   D-Dimer, Quant 2.81 (H) 0.00 - 0.50 ug/mL-FEU  C-reactive protein  Result Value Ref Range   CRP <0.8 <1.0 mg/dL  Comprehensive metabolic panel  Result Value Ref Range   Sodium 136 135 - 145 mmol/L   Potassium 4.4 3.5 - 5.1 mmol/L   Chloride 99 98 - 111 mmol/L   CO2 27 22 - 32 mmol/L   Glucose, Bld 94 70 - 99 mg/dL   BUN 20 6 - 20 mg/dL   Creatinine, Ser 0.97 0.44 - 1.00 mg/dL   Calcium 9.2 8.9 - 10.3 mg/dL   Total Protein 7.2 6.5 - 8.1 g/dL   Albumin 2.8 (L) 3.5 - 5.0 g/dL   AST 21 15 - 41 U/L   ALT 22 0 - 44 U/L   Alkaline Phosphatase 48 38 - 126 U/L   Total Bilirubin 0.5 0.3 - 1.2 mg/dL   GFR calc non Af Amer >60 >60 mL/min   GFR calc Af Amer >60 >60 mL/min   Anion gap 10 5 - 15  CBC with Differential/Platelet  Result Value Ref Range   WBC 10.4 4.0 - 10.5 K/uL   RBC 4.84 3.87 - 5.11 MIL/uL   Hemoglobin 10.8 (L) 12.0 - 15.0 g/dL   HCT 35.0 (L) 36.0 - 46.0 %   MCV 72.3 (L) 80.0 - 100.0 fL   MCH 22.3 (L) 26.0 - 34.0 pg   MCHC 30.9 30.0 - 36.0 g/dL   RDW 17.9 (H) 11.5 - 15.5 %   Platelets 603 (H) 150 - 400 K/uL   nRBC 0.0 0.0 - 0.2 %   Neutrophils Relative % 66 %   Neutro Abs 7.0 1.7 - 7.7 K/uL   Lymphocytes Relative 19 %   Lymphs Abs 2.0 0.7 - 4.0 K/uL   Monocytes Relative 11 %  Monocytes Absolute 1.1 (H) 0.1 - 1.0 K/uL   Eosinophils Relative 1 %   Eosinophils Absolute 0.1 0.0 - 0.5 K/uL   Basophils Relative 1 %   Basophils Absolute 0.1 0.0 - 0.1 K/uL   Immature Granulocytes 2 %   Abs Immature Granulocytes 0.18 (H) 0.00 - 0.07 K/uL  Magnesium  Result Value Ref Range   Magnesium 1.6 (L) 1.7 - 2.4 mg/dL  Ferritin  Result Value Ref Range   Ferritin 61 11 - 307 ng/mL  Lactate dehydrogenase  Result Value Ref Range   LDH 217 (H) 98 - 192 U/L  Glucose, capillary  Result Value Ref Range   Glucose-Capillary 101 (H) 70 - 99 mg/dL  Glucose, capillary  Result Value Ref Range   Glucose-Capillary 107 (H) 70 - 99 mg/dL  Glucose, capillary   Result Value Ref Range   Glucose-Capillary 99 70 - 99 mg/dL  I-STAT 7, (LYTES, BLD GAS, ICA, H+H)  Result Value Ref Range   pH, Arterial 7.433 7.350 - 7.450   pCO2 arterial 39.2 32.0 - 48.0 mmHg   pO2, Arterial 69.0 (L) 83.0 - 108.0 mmHg   Bicarbonate 26.1 20.0 - 28.0 mmol/L   TCO2 27 22 - 32 mmol/L   O2 Saturation 94.0 %   Acid-Base Excess 2.0 0.0 - 2.0 mmol/L   Sodium 132 (L) 135 - 145 mmol/L   Potassium 3.8 3.5 - 5.1 mmol/L   Calcium, Ion 1.04 (L) 1.15 - 1.40 mmol/L   HCT 35.0 (L) 36.0 - 46.0 %   Hemoglobin 11.9 (L) 12.0 - 15.0 g/dL   Patient temperature 99.2 F    Collection site RADIAL, ALLEN'S TEST ACCEPTABLE    Drawn by RT    Sample type ARTERIAL   CBG monitoring, ED  Result Value Ref Range   Glucose-Capillary 322 (H) 70 - 99 mg/dL  I-STAT 7, (LYTES, BLD GAS, ICA, H+H)  Result Value Ref Range   pH, Arterial 7.508 (H) 7.350 - 7.450   pCO2 arterial 34.1 32.0 - 48.0 mmHg   pO2, Arterial 35.0 (LL) 83.0 - 108.0 mmHg   Bicarbonate 26.9 20.0 - 28.0 mmol/L   TCO2 28 22 - 32 mmol/L   O2 Saturation 72.0 %   Acid-Base Excess 4.0 (H) 0.0 - 2.0 mmol/L   Sodium 134 (L) 135 - 145 mmol/L   Potassium 4.3 3.5 - 5.1 mmol/L   Calcium, Ion 1.05 (L) 1.15 - 1.40 mmol/L   HCT 38.0 36.0 - 46.0 %   Hemoglobin 12.9 12.0 - 15.0 g/dL   Patient temperature 99.8 F    Collection site RADIAL, ALLEN'S TEST ACCEPTABLE    Drawn by RT    Sample type ARTERIAL    Comment MD NOTIFIED, SUGGEST RECOLLECT   I-STAT 7, (LYTES, BLD GAS, ICA, H+H)  Result Value Ref Range   pH, Arterial 7.452 (H) 7.350 - 7.450   pCO2 arterial 34.3 32.0 - 48.0 mmHg   pO2, Arterial 22.0 (LL) 83.0 - 108.0 mmHg   Bicarbonate 23.8 20.0 - 28.0 mmol/L   TCO2 25 22 - 32 mmol/L   O2 Saturation 38.0 %   Acid-Base Excess 1.0 0.0 - 2.0 mmol/L   Sodium 134 (L) 135 - 145 mmol/L   Potassium 4.2 3.5 - 5.1 mmol/L   Calcium, Ion 1.06 (L) 1.15 - 1.40 mmol/L   HCT 39.0 36.0 - 46.0 %   Hemoglobin 13.3 12.0 - 15.0 g/dL   Patient  temperature 99.8 F    Collection site RADIAL, ALLEN'S TEST ACCEPTABLE  Drawn by RT    Sample type ARTERIAL    Comment NOTIFIED PHYSICIAN   CBG monitoring, ED  Result Value Ref Range   Glucose-Capillary 297 (H) 70 - 99 mg/dL   Comment 1 Notify RN   CBG monitoring, ED  Result Value Ref Range   Glucose-Capillary 235 (H) 70 - 99 mg/dL  CBG monitoring, ED  Result Value Ref Range   Glucose-Capillary 168 (H) 70 - 99 mg/dL  I-STAT 7, (LYTES, BLD GAS, ICA, H+H)  Result Value Ref Range   pH, Arterial 7.481 (H) 7.350 - 7.450   pCO2 arterial 38.5 32.0 - 48.0 mmHg   pO2, Arterial 67.0 (L) 83.0 - 108.0 mmHg   Bicarbonate 28.7 (H) 20.0 - 28.0 mmol/L   TCO2 30 22 - 32 mmol/L   O2 Saturation 94.0 %   Acid-Base Excess 5.0 (H) 0.0 - 2.0 mmol/L   Sodium 139 135 - 145 mmol/L   Potassium 3.8 3.5 - 5.1 mmol/L   Calcium, Ion 1.05 (L) 1.15 - 1.40 mmol/L   HCT 38.0 36.0 - 46.0 %   Hemoglobin 12.9 12.0 - 15.0 g/dL   Patient temperature 98.5 F    Collection site RADIAL, ALLEN'S TEST ACCEPTABLE    Drawn by RT    Sample type ARTERIAL   ECHOCARDIOGRAM COMPLETE  Result Value Ref Range   Weight 3,520 oz   Height 65 in   BP 166/95 mmHg  ABO/Rh  Result Value Ref Range   ABO/RH(D)      O POS Performed at [facility], [place] [address] [place], [place] [address]   Prepare fresh frozen plasma  Result Value Ref Range   Unit Number [id number]    Blood Component Type FP24, THAWED    Unit division 00    Status of Unit ISSUED,FINAL    Transfusion Status      OK TO TRANSFUSE Performed at [facility], [place] [address], [place], [place] [address]   Sample to Blood Bank  Result Value Ref Range   Blood Bank Specimen SAMPLE AVAILABLE FOR TESTING    Sample Expiration      06/12/2019,2359 Performed at [facility], [place] [address], [place], [place] [address]   BPAM FFP  Result Value Ref Range   ISSUE DATE / TIME [id number]    Blood Product Unit Number [id number]     PRODUCT CODE [id number]    Unit Type and Rh 6200    Blood Product Expiration Date [id number]    Dg Chest Port 1 View  Result Date: 06/07/2019 CLINICAL DATA:  History of intubation EXAM: PORTABLE CHEST 1 VIEW COMPARISON:  Three days ago FINDINGS: No endotracheal tube is seen. Patchy bilateral pneumonia with possible mild clearing in the subpleural right lung. Cardiomegaly and vascular pedicle widening. Possible vascular congestion. Low lung volumes. No pneumothorax IMPRESSION: 1. Low volume chest with multifocal pneumonia that is mildly improved. 2. There is cardiomegaly and possible vascular congestion. Electronically Signed   By: Monte Fantasia M.D.   On: 06/07/2019 07:57   Xr Chest Portable  Result Date: 06/04/2019 CLINICAL DATA:  Shortness of breath.  Coronavirus infection. EXAM: PORTABLE CHEST 1 VIEW COMPARISON:  08/19/2018 FINDINGS: Heart size upper limits of normal. Extensive bilateral patchy pulmonary infiltrates consistent with viral pneumonia. No dense consolidation, collapse or effusion. No significant bone finding. IMPRESSION: Extensive patchy bilateral pulmonary infiltrates consistent with viral pneumonia. Electronically Signed   By: Nelson Chimes M.D.   On: 06/04/2019 19:12   Vas  [place] Lower Extremity Venous (dvt)  Result Date: 06/07/2019  Lower Venous Study Indications: Elevated D-Dimer, Covid positive.  Comparison Study: No prior study on file for comparison Performing Technologist: Sharion Dove RVS  Examination Guidelines: A complete evaluation includes B-mode imaging, spectral Doppler, color Doppler, and power Doppler as needed of all accessible portions of each vessel. Bilateral testing is considered an integral part of a complete examination. Limited examinations for reoccurring indications may be performed as noted.  +---------+---------------+---------+-----------+----------+--------------+  RIGHT     Compressibility Phasicity Spontaneity Properties Thrombus Aging   +---------+---------------+---------+-----------+----------+--------------+  CFV       Full                                                             +---------+---------------+---------+-----------+----------+--------------+  SFJ       Full                                                             +---------+---------------+---------+-----------+----------+--------------+  FV Prox   Full                                                             +---------+---------------+---------+-----------+----------+--------------+  FV Mid    Full                                                             +---------+---------------+---------+-----------+----------+--------------+  FV Distal Full                                                             +---------+---------------+---------+-----------+----------+--------------+  PFV       Full                                                             +---------+---------------+---------+-----------+----------+--------------+  POP       Full                                                             +---------+---------------+---------+-----------+----------+--------------+  PTV       None  Acute           +---------+---------------+---------+-----------+----------+--------------+  PERO      Full                                                             +---------+---------------+---------+-----------+----------+--------------+  Soleal    None                                             Acute           +---------+---------------+---------+-----------+----------+--------------+  Gastroc   None                                             Acute           +---------+---------------+---------+-----------+----------+--------------+   +---------+---------------+---------+-----------+----------+--------------+  LEFT      Compressibility Phasicity Spontaneity Properties Thrombus Aging   +---------+---------------+---------+-----------+----------+--------------+  CFV       Full            Yes       Yes                                    +---------+---------------+---------+-----------+----------+--------------+  SFJ       Full                                                             +---------+---------------+---------+-----------+----------+--------------+  FV Prox   Full                                                             +---------+---------------+---------+-----------+----------+--------------+  FV Mid    Full                                                             +---------+---------------+---------+-----------+----------+--------------+  FV Distal Full                                                             +---------+---------------+---------+-----------+----------+--------------+  PFV       Full                                                             +---------+---------------+---------+-----------+----------+--------------+  POP       Full            Yes       Yes                                    +---------+---------------+---------+-----------+----------+--------------+  PTV       None                                             Acute           +---------+---------------+---------+-----------+----------+--------------+  PERO      None                                             Acute           +---------+---------------+---------+-----------+----------+--------------+     Summary: Right: Findings consistent with acute deep vein thrombosis involving the right posterior tibial veins, right soleal veins, and right gastrocnemius veins. Left: Findings consistent with acute deep vein thrombosis involving the left posterior tibial veins, and left peroneal veins.  *See table(s) above for measurements and observations. Electronically signed by Servando Snare MD on 06/07/2019 at 10:58:31 AM.    Final     EKG EKG Interpretation  Date/Time:  Thursday June 04 2019  18:57:51 EST Ventricular Rate:  118 PR Interval:  170 QRS Duration: 78 QT Interval:  318 QTC Calculation: 445 R Axis:   99 Text Interpretation: Sinus tachycardia Rightward axis Non-specific ST-t changes Confirmed by Lajean Saver [phone] on 06/04/2019 7:13:40 PM   Radiology Xr Chest Portable  Result Date: 06/04/2019 CLINICAL DATA:  Shortness of breath.  Coronavirus infection. EXAM: PORTABLE CHEST 1 VIEW COMPARISON:  08/19/2018 FINDINGS: Heart size upper limits of normal. Extensive bilateral patchy pulmonary infiltrates consistent with viral pneumonia. No dense consolidation, collapse or effusion. No significant bone finding. IMPRESSION: Extensive patchy bilateral pulmonary infiltrates consistent with viral pneumonia. Electronically Signed   By: Nelson Chimes M.D.   On: 06/04/2019 19:12    Procedures Procedures (including critical care time)  Medications Ordered in ED Medications - No data to display   Initial Impression / Assessment and Plan / ED Course  I have reviewed the triage vital signs and the nursing notes.  Pertinent labs & imaging results that were available during my care of the patient were reviewed by me and considered in my medical decision making (see chart for details).  Iv ns. Stat labs. Pcxr. Continuous pulse ox and monitor. Airborne and contact precautions.  Reviewed nursing notes and prior charts for additional history. covid test 11/11 positive. Also of note, recent thyroid tests 11/2, both tsh and free t4 elevated - will need further eval once over current acute covid infection.   Brittany Rubio was evaluated in Emergency Department on 06/04/2019 for the symptoms described in the history of present illness. She was evaluated in the context of the global COVID-19 pandemic, which necessitated consideration that the patient might be at risk for infection with the SARS-CoV-2 virus that causes COVID-19. Institutional protocols and algorithms that pertain to the  evaluation of patients at risk for COVID-19 are in a state of rapid change based on information released by  regulatory bodies including the CDC and federal and state organizations. These policies and algorithms were followed during the patient's care in the ED.  CXR reviewed/interpreted by me - extensive bil infilterate c/w covid.   Labs reviewed/interpreted by me - recent covid test positive. Wbc sl elev.   Additional labs including the covid panel of labs remain pending.   1930, signed out to Dr Langston Masker to f/u on labs and plan for admission to [facility].  o2 titrated up to keep sats >90%. Respiratory therapy consulted. Patient does have significant o2 requirement and work of breathing and will require admission, and suspect icu level of care.   CRITICAL CARE RE: acute respiratory failure due to covid infection with multilobar pna and hypoxia.  Performed by: Mirna Mires Total critical care time: 40 minutes Critical care time was exclusive of separately billable procedures and treating other patients. Critical care was necessary to treat or prevent imminent or life-threatening deterioration. Critical care was time spent personally by me on the following activities: development of treatment plan with patient and/or surrogate as well as nursing, discussions with consultants, evaluation of patient's response to treatment, examination of patient, obtaining history from patient or surrogate, ordering and performing treatments and interventions, ordering and review of laboratory studies, ordering and review of radiographic studies, pulse oximetry and re-evaluation of patient's condition.       Final Clinical Impressions(s) / ED Diagnoses   Final diagnoses:  None    ED Discharge Orders    None             Lajean Saver, MD 06/15/19 [phone]

## 2019-06-05 ENCOUNTER — Inpatient Hospital Stay (HOSPITAL_COMMUNITY): Payer: BC Managed Care – PPO

## 2019-06-05 DIAGNOSIS — A419 Sepsis, unspecified organism: Secondary | ICD-10-CM | POA: Diagnosis present

## 2019-06-05 DIAGNOSIS — R6520 Severe sepsis without septic shock: Secondary | ICD-10-CM

## 2019-06-05 DIAGNOSIS — U071 COVID-19: Secondary | ICD-10-CM

## 2019-06-05 DIAGNOSIS — I361 Nonrheumatic tricuspid (valve) insufficiency: Secondary | ICD-10-CM

## 2019-06-05 HISTORY — DX: Sepsis, unspecified organism: A41.9

## 2019-06-05 HISTORY — DX: Sepsis, unspecified organism: R65.20

## 2019-06-05 LAB — POCT I-STAT 7, (LYTES, BLD GAS, ICA,H+H)
Acid-Base Excess: 4 mmol/L — ABNORMAL HIGH (ref 0.0–2.0)
Acid-Base Excess: 1 mmol/L (ref 0.0–2.0)
Bicarbonate: 26.9 mmol/L (ref 20.0–28.0)
Bicarbonate: 23.8 mmol/L (ref 20.0–28.0)
Calcium, Ion: 1.05 mmol/L — ABNORMAL LOW (ref 1.15–1.40)
Calcium, Ion: 1.06 mmol/L — ABNORMAL LOW (ref 1.15–1.40)
HCT: 38 % (ref 36.0–46.0)
HCT: 39 % (ref 36.0–46.0)
Hemoglobin: 12.9 g/dL (ref 12.0–15.0)
Hemoglobin: 13.3 g/dL (ref 12.0–15.0)
O2 Saturation: 72 %
O2 Saturation: 38 %
Patient temperature: 99.8
Patient temperature: 99.8
Potassium: 4.3 mmol/L (ref 3.5–5.1)
Potassium: 4.2 mmol/L (ref 3.5–5.1)
Sodium: 134 mmol/L — ABNORMAL LOW (ref 135–145)
Sodium: 134 mmol/L — ABNORMAL LOW (ref 135–145)
TCO2: 28 mmol/L (ref 22–32)
TCO2: 25 mmol/L (ref 22–32)
pCO2 arterial: 34.1 mmHg (ref 32.0–48.0)
pCO2 arterial: 34.3 mmHg (ref 32.0–48.0)
pH, Arterial: 7.508 — ABNORMAL HIGH (ref 7.350–7.450)
pH, Arterial: 7.452 — ABNORMAL HIGH (ref 7.350–7.450)
pO2, Arterial: 35 mmHg — CL (ref 83.0–108.0)
pO2, Arterial: 22 mmHg — CL (ref 83.0–108.0)

## 2019-06-05 LAB — URINALYSIS, COMPLETE (UACMP) WITH MICROSCOPIC
Bilirubin Urine: NEGATIVE
Glucose, UA: >=500 mg/dL — AB
Hgb urine dipstick: NEGATIVE
Ketones, ur: 5 mg/dL — AB
Nitrite: NEGATIVE
Protein, ur: 100 mg/dL — AB
Specific Gravity, Urine: 1.025 (ref 1.005–1.030)
pH: 5 (ref 5.0–8.0)

## 2019-06-05 LAB — FIBRINOGEN: Fibrinogen: 725 mg/dL — ABNORMAL HIGH (ref 210–475)

## 2019-06-05 LAB — CBC WITH DIFFERENTIAL/PLATELET
Abs Immature Granulocytes: 0.41 10*3/uL — ABNORMAL HIGH (ref 0.00–0.07)
Basophils Absolute: 0.1 10*3/uL (ref 0.0–0.1)
Basophils Relative: 1 %
Eosinophils Absolute: 0 10*3/uL (ref 0.0–0.5)
Eosinophils Relative: 0 %
HCT: 38.7 % (ref 36.0–46.0)
Hemoglobin: 12.1 g/dL (ref 12.0–15.0)
Immature Granulocytes: 4 %
Lymphocytes Relative: 6 %
Lymphs Abs: 0.7 10*3/uL (ref 0.7–4.0)
MCH: 22.4 pg — ABNORMAL LOW (ref 26.0–34.0)
MCHC: 31.3 g/dL (ref 30.0–36.0)
MCV: 71.5 fL — ABNORMAL LOW (ref 80.0–100.0)
Monocytes Absolute: 0.6 10*3/uL (ref 0.1–1.0)
Monocytes Relative: 6 %
Neutro Abs: 9.3 10*3/uL — ABNORMAL HIGH (ref 1.7–7.7)
Neutrophils Relative %: 83 %
Platelets: 212 10*3/uL (ref 150–400)
RBC: 5.41 MIL/uL — ABNORMAL HIGH (ref 3.87–5.11)
RDW: 17.2 % — ABNORMAL HIGH (ref 11.5–15.5)
WBC: 11.2 10*3/uL — ABNORMAL HIGH (ref 4.0–10.5)
nRBC: 1.4 % — ABNORMAL HIGH (ref 0.0–0.2)

## 2019-06-05 LAB — COMPREHENSIVE METABOLIC PANEL
ALT: 29 U/L (ref 0–44)
AST: 30 U/L (ref 15–41)
Albumin: 2.7 g/dL — ABNORMAL LOW (ref 3.5–5.0)
Alkaline Phosphatase: 67 U/L (ref 38–126)
Anion gap: 18 — ABNORMAL HIGH (ref 5–15)
BUN: 25 mg/dL — ABNORMAL HIGH (ref 6–20)
CO2: 22 mmol/L (ref 22–32)
Calcium: 8 mg/dL — ABNORMAL LOW (ref 8.9–10.3)
Chloride: 94 mmol/L — ABNORMAL LOW (ref 98–111)
Creatinine, Ser: 1.24 mg/dL — ABNORMAL HIGH (ref 0.44–1.00)
GFR calc Af Amer: 55 mL/min — ABNORMAL LOW (ref >60)
GFR calc non Af Amer: 48 mL/min — ABNORMAL LOW (ref >60)
Glucose, Bld: 317 mg/dL — ABNORMAL HIGH (ref 70–99)
Potassium: 4.5 mmol/L (ref 3.5–5.1)
Sodium: 134 mmol/L — ABNORMAL LOW (ref 135–145)
Total Bilirubin: 0.6 mg/dL (ref 0.3–1.2)
Total Protein: 7.1 g/dL (ref 6.5–8.1)

## 2019-06-05 LAB — D-DIMER, QUANTITATIVE: D-Dimer, Quant: >20 ug/mL-FEU — ABNORMAL HIGH (ref 0.00–0.50)

## 2019-06-05 LAB — C-REACTIVE PROTEIN: CRP: 29.7 mg/dL — ABNORMAL HIGH (ref <1.0)

## 2019-06-05 LAB — CBG MONITORING, ED
Glucose-Capillary: 297 mg/dL — ABNORMAL HIGH (ref 70–99)
Glucose-Capillary: 235 mg/dL — ABNORMAL HIGH (ref 70–99)
Glucose-Capillary: 168 mg/dL — ABNORMAL HIGH (ref 70–99)

## 2019-06-05 LAB — STREP PNEUMONIAE URINARY ANTIGEN: Strep Pneumo Urinary Antigen: NEGATIVE

## 2019-06-05 LAB — FERRITIN: Ferritin: 47 ng/mL (ref 11–307)

## 2019-06-05 LAB — SODIUM, URINE, RANDOM: Sodium, Ur: 27 mmol/L

## 2019-06-05 LAB — ABO/RH: ABO/RH(D): O POS

## 2019-06-05 LAB — CREATININE, URINE, RANDOM: Creatinine, Urine: 63.54 mg/dL

## 2019-06-05 LAB — MRSA PCR SCREENING: MRSA by PCR: NEGATIVE

## 2019-06-05 LAB — ECHOCARDIOGRAM COMPLETE
Height: 65 in
Weight: 3520 oz

## 2019-06-05 LAB — MAGNESIUM
Magnesium: 2.6 mg/dL — ABNORMAL HIGH (ref 1.7–2.4)
Magnesium: 2.4 mg/dL (ref 1.7–2.4)

## 2019-06-05 LAB — HIV ANTIBODY (ROUTINE TESTING W REFLEX): HIV Screen 4th Generation wRfx: NONREACTIVE

## 2019-06-05 LAB — LACTIC ACID, PLASMA: Lactic Acid, Venous: 1.5 mmol/L (ref 0.5–1.9)

## 2019-06-05 LAB — PHOSPHORUS: Phosphorus: 3.2 mg/dL (ref 2.5–4.6)

## 2019-06-05 LAB — GLUCOSE, CAPILLARY: Glucose-Capillary: 200 mg/dL — ABNORMAL HIGH (ref 70–99)

## 2019-06-05 MED ORDER — SODIUM CHLORIDE 0.9 % IV SOLN
200.0000 mg | Freq: Once | INTRAVENOUS | Status: AC
  Administered 2019-06-05 (×2): 200 mg via INTRAVENOUS
  Filled 2019-06-05: qty 40

## 2019-06-05 MED ORDER — CHLORHEXIDINE GLUCONATE CLOTH 2 % EX PADS
6.0000 | MEDICATED_PAD | Freq: Every day | CUTANEOUS | Status: DC
  Administered 2019-06-05 – 2019-06-16 (×9): 6 via TOPICAL

## 2019-06-05 MED ORDER — HEPARIN BOLUS VIA INFUSION
5500.0000 [IU] | Freq: Once | INTRAVENOUS | Status: AC
  Administered 2019-06-05: 5500 [IU] via INTRAVENOUS
  Filled 2019-06-05: qty 5500

## 2019-06-05 MED ORDER — SODIUM CHLORIDE 0.9% IV SOLUTION
Freq: Once | INTRAVENOUS | Status: AC
  Administered 2019-06-05: 10:00:00 via INTRAVENOUS

## 2019-06-05 MED ORDER — METHYLPREDNISOLONE SODIUM SUCC 125 MG IJ SOLR
60.0000 mg | Freq: Two times a day (BID) | INTRAMUSCULAR | Status: DC
  Filled 2019-06-05: qty 2

## 2019-06-05 MED ORDER — HEPARIN SODIUM (PORCINE) 10000 UNIT/ML IJ SOLN
7500.0000 [IU] | Freq: Three times a day (TID) | INTRAMUSCULAR | Status: DC
  Filled 2019-06-05: qty 1

## 2019-06-05 MED ORDER — VILAZODONE HCL 40 MG PO TABS
40.0000 mg | ORAL_TABLET | Freq: Every day | ORAL | Status: DC
  Administered 2019-06-06 – 2019-06-08 (×3): 40 mg via ORAL
  Filled 2019-06-05 (×6): qty 1

## 2019-06-05 MED ORDER — SODIUM CHLORIDE 0.9 % IV SOLN
100.0000 mg | INTRAVENOUS | Status: AC
  Administered 2019-06-06 – 2019-06-09 (×4): 100 mg via INTRAVENOUS
  Filled 2019-06-05 (×2): qty 20
  Filled 2019-06-05: qty 100
  Filled 2019-06-05 (×2): qty 20

## 2019-06-05 MED ORDER — BUTORPHANOL TARTRATE 10 MG/ML NA SOLN
1.0000 | Freq: Once | NASAL | Status: AC | PRN
  Administered 2019-06-05: 1 via NASAL
  Filled 2019-06-05: qty 2.5

## 2019-06-05 MED ORDER — ORAL CARE MOUTH RINSE
15.0000 mL | Freq: Two times a day (BID) | OROMUCOSAL | Status: DC
  Administered 2019-06-06 – 2019-06-16 (×20): 15 mL via OROMUCOSAL

## 2019-06-05 MED ORDER — HEPARIN BOLUS VIA INFUSION
5500.0000 [IU] | Freq: Once | INTRAVENOUS | Status: DC
  Filled 2019-06-05: qty 5500

## 2019-06-05 MED ORDER — INSULIN DETEMIR 100 UNIT/ML ~~LOC~~ SOLN
7.0000 [IU] | Freq: Two times a day (BID) | SUBCUTANEOUS | Status: DC
  Administered 2019-06-05 – 2019-06-16 (×23): 7 [IU] via SUBCUTANEOUS
  Filled 2019-06-05 (×26): qty 0.07

## 2019-06-05 MED ORDER — LEVOTHYROXINE SODIUM 112 MCG PO TABS
224.0000 ug | ORAL_TABLET | Freq: Every day | ORAL | Status: DC
  Administered 2019-06-05 – 2019-06-16 (×10): 224 ug via ORAL
  Filled 2019-06-05 (×15): qty 2

## 2019-06-05 MED ORDER — INSULIN ASPART 100 UNIT/ML ~~LOC~~ SOLN
0.0000 [IU] | SUBCUTANEOUS | Status: DC
  Administered 2019-06-05: 4 [IU] via SUBCUTANEOUS
  Administered 2019-06-05: 3 [IU] via SUBCUTANEOUS
  Administered 2019-06-05: 7 [IU] via SUBCUTANEOUS
  Administered 2019-06-06 (×2): 4 [IU] via SUBCUTANEOUS
  Administered 2019-06-06: 3 [IU] via SUBCUTANEOUS
  Administered 2019-06-06: 4 [IU] via SUBCUTANEOUS
  Administered 2019-06-07 (×2): 3 [IU] via SUBCUTANEOUS
  Administered 2019-06-07: 4 [IU] via SUBCUTANEOUS
  Administered 2019-06-08: 3 [IU] via SUBCUTANEOUS
  Administered 2019-06-08: 4 [IU] via SUBCUTANEOUS
  Administered 2019-06-08: 11 [IU] via SUBCUTANEOUS
  Administered 2019-06-08: 7 [IU] via SUBCUTANEOUS
  Administered 2019-06-09: 3 [IU] via SUBCUTANEOUS
  Administered 2019-06-09: 11 [IU] via SUBCUTANEOUS
  Administered 2019-06-09: 4 [IU] via SUBCUTANEOUS
  Administered 2019-06-09: 20 [IU] via SUBCUTANEOUS
  Administered 2019-06-10 (×2): 4 [IU] via SUBCUTANEOUS
  Administered 2019-06-10: 7 [IU] via SUBCUTANEOUS
  Administered 2019-06-10: 11 [IU] via SUBCUTANEOUS
  Administered 2019-06-11: 4 [IU] via SUBCUTANEOUS
  Administered 2019-06-11: 3 [IU] via SUBCUTANEOUS
  Administered 2019-06-11 (×2): 4 [IU] via SUBCUTANEOUS
  Administered 2019-06-11: 3 [IU] via SUBCUTANEOUS
  Administered 2019-06-12: 11 [IU] via SUBCUTANEOUS
  Administered 2019-06-12 (×2): 4 [IU] via SUBCUTANEOUS
  Administered 2019-06-12: 11 [IU] via SUBCUTANEOUS
  Administered 2019-06-13 (×2): 4 [IU] via SUBCUTANEOUS
  Administered 2019-06-13 – 2019-06-14 (×2): 11 [IU] via SUBCUTANEOUS
  Administered 2019-06-15 (×2): 3 [IU] via SUBCUTANEOUS

## 2019-06-05 MED ORDER — DEXAMETHASONE SODIUM PHOSPHATE 10 MG/ML IJ SOLN
6.0000 mg | INTRAMUSCULAR | Status: AC
  Administered 2019-06-05 – 2019-06-14 (×10): 6 mg via INTRAVENOUS
  Filled 2019-06-05 (×2): qty 1
  Filled 2019-06-05: qty 0.6
  Filled 2019-06-05 (×2): qty 1
  Filled 2019-06-05: qty 0.6
  Filled 2019-06-05 (×2): qty 1
  Filled 2019-06-05: qty 0.6

## 2019-06-05 MED ORDER — SODIUM CHLORIDE 0.9 % IV SOLN
500.0000 mg | INTRAVENOUS | Status: DC
  Administered 2019-06-05 – 2019-06-06 (×2): 500 mg via INTRAVENOUS
  Filled 2019-06-05 (×3): qty 500

## 2019-06-05 MED ORDER — DEXAMETHASONE SODIUM PHOSPHATE 10 MG/ML IJ SOLN
INTRAMUSCULAR | Status: AC
  Filled 2019-06-05: qty 1

## 2019-06-05 MED ORDER — SODIUM CHLORIDE 0.9 % IV SOLN
2.0000 g | INTRAVENOUS | Status: DC
  Administered 2019-06-05 – 2019-06-07 (×3): 2 g via INTRAVENOUS
  Filled 2019-06-05 (×5): qty 20

## 2019-06-05 MED ORDER — FAMOTIDINE IN NACL 20-0.9 MG/50ML-% IV SOLN
20.0000 mg | Freq: Two times a day (BID) | INTRAVENOUS | Status: DC
  Administered 2019-06-05 – 2019-06-06 (×4): 20 mg via INTRAVENOUS
  Filled 2019-06-05 (×5): qty 50

## 2019-06-05 MED ORDER — TRAZODONE HCL 50 MG PO TABS
100.0000 mg | ORAL_TABLET | Freq: Every day | ORAL | Status: DC
  Administered 2019-06-05 – 2019-06-15 (×10): 100 mg via ORAL
  Filled 2019-06-05 (×11): qty 2

## 2019-06-05 MED ORDER — HEPARIN (PORCINE) 25000 UT/250ML-% IV SOLN
1600.0000 [IU]/h | INTRAVENOUS | Status: DC
  Administered 2019-06-05 – 2019-06-06 (×2): 1400 [IU]/h via INTRAVENOUS
  Administered 2019-06-07 (×3): 1500 [IU]/h via INTRAVENOUS
  Administered 2019-06-09: 1700 [IU]/h via INTRAVENOUS
  Administered 2019-06-10: 1650 [IU]/h via INTRAVENOUS
  Administered 2019-06-10 (×2): 1600 [IU]/h via INTRAVENOUS
  Filled 2019-06-05 (×9): qty 250

## 2019-06-05 NOTE — ED Notes (Signed)
Charge RN is aware of pt condition, Critical Care being paged.  Pt is calm but remains tachypneic and hypoxic

## 2019-06-05 NOTE — ED Notes (Signed)
Pt was up to [facility] where she voided and had a BM.  Pt Spo2 dropped to 50's, pt was still able to speak with me during this.  Increased pt Os to 100% and 40L to help her and helped her back into bed (I had changed her linens). Pt SpO2 increased to mid 80's and pt is resting.  Notified admitting MD.

## 2019-06-05 NOTE — ED Notes (Signed)
Pt desat when she removed her O2 high flow cannula briefly to take stadol nasal spray.  RT was at bedside (the desat was to high 30% but pt was still alert).  RT increased O2 flow to 45L and placed a NRB at 15L on pt as she felt some of the problem was that pt is mouth breathing causing her not to get all the O2 we are trying to give her.  Spoke with Dr. Florene Glen on phone who reviewed pt chart and is placing new orders. Will call us regarding dopplers for pt.

## 2019-06-05 NOTE — ED Notes (Signed)
Call to [facility] to check on plasma, they will call once it is thawed

## 2019-06-05 NOTE — Progress Notes (Signed)
Stop by to see patient in the ED  On supplemental oxygen Tired  Offers no new complaints  Continue oxygen supplementation Ensure she keeps oxygen on and stable  ABG may be considered if concern for CO2 retention although this is unlikely as she does not have underlying lung disease as far as we know

## 2019-06-05 NOTE — Progress Notes (Addendum)
ANTICOAGULATION CONSULT NOTE  Pharmacy Consult for heparin Indication: r/o VTE  Heparin Dosing Weight: 79.8 kg  Labs: Recent Labs    06/04/19 1856 06/04/19 1918  06/05/19 0426 06/05/19 0517 06/05/19 0700  HGB 12.0  --    < > 12.9 13.3 12.1  HCT 38.4  --    < > 38.0 39.0 38.7  PLT 314  --   --   --   --  212  CREATININE  --  1.73*  --   --   --  1.24*   < > = values in this interval not displayed.    Assessment: 10 yof presenting with SOB, COVID-19+ from 05/27/19. Pharmacy now consulted to start heparin drip for r/o VTE with d-dimer increased to >20. LE dopplers pending. CBC stable. No active bleed issues documented. Not on anticoagulation PTA. Patient previously on heparin 5000 units [place] q8h for VTE prophylaxis (last dose 0649 today).  Goal of Therapy:  Heparin level 0.3-0.7 units/ml Monitor platelets by anticoagulation protocol: Yes   Plan:  Heparin 5500 unit bolus Start heparin at 1400 units/h 6h heparin level Daily heparin level/CBC Monitor s/sx bleeding  Elicia Lamp, PharmD, BCPS Clinical Pharmacist 06/05/2019 3:18 PM

## 2019-06-05 NOTE — Progress Notes (Signed)
MD paged by RN. RT spoke with MD about ABG result which yielded at po2 of 35 and a sat of 72%. MD suggested a recollect. Charge RT will restick pt.

## 2019-06-05 NOTE — Progress Notes (Addendum)
PROGRESS NOTE    Brittany Rubio  XBD:[id number] DOB: Apr 09, 1961 DOA: 06/04/2019 PCP: Glendale Chard, MD   Brief Narrative:  Brittany Rubio is Brittany Rubio 58 y.o. female with medical history significant for type 2 diabetes mellitus, obstructive sleep apnea on home CPAP, acquired hypothyroidism, essential hypertension, who was admitted to [facility] on 06/04/2019 with acute hypoxic respiratory failure in the setting of Covid pneumonia at presenting from home to [facility] emergency department complaining of shortness of breath.  Patient tested positive for COVID 19 on 11/11 and had progressive SOB over past 2 days leading to her coming to the hospital.   Assessment & Plan:   Principal Problem:   Pneumonia due to COVID-19 virus Active Problems:   Hypertension   AKI (acute kidney injury) ([place])   Type 2 diabetes mellitus with microalbuminuria, without long-term current use of insulin (HCC)   Acute on chronic respiratory failure with hypoxia (HCC)   Severe sepsis ([place])   COVID-19 virus infection  Of note, discussed with Dr. Lynetta Mare, the ICU is planning to take over care.  Will follow the rest of the day, then sign off tomorrow as ICU taking over care.  Addendum: D dimer from this AM resulted >20.  In setting of AHRF and COVID 19 concerning for VTE.  Will empirically start heparin gtt while awaiting LE dopplers and echocardiogram.  Per RN, pt desatting significantly with minimal activity - she's improved after NRB placed over HFNC satting in low 90's.  #) Acute hypoxic respiratory failure due to COVID-19:  Currently on HFNC at 20 L and 80 FiO2 CXR with extensive patchy bilateral pulm infiltrates Continue dexamethasone.  Add remedsivir.  Discussed risk/benefits of plasma, she's agreeable to this.  Consent form was given for patient look over and to sign.  Planning to give plasma as well pending her review of paper consent and signing this. Procalcitonin is 1.11, elevated WBC - will  add abx to cover possible superimposed bacterial pneumonia - deescalate as able Daily inflammatory labs - elevated d dimer > 5 - intermediate dose heparin  I/O, daily weights - holding off on diuretics at this time given aki on presentation Prone as able Plan for transfer to [facility] when bed available  I've consulted PCCM due to AHRF requiring high flow   COVID-19 Labs  Recent Labs    06/04/19 1918 06/05/19 0700  DDIMER 5.57*  --   FERRITIN 55 47  LDH 395*  --   CRP 30.4* 29.7*    Lab Results  Component Value Date   SARSCOV2NAA Detected (Khylan Sawyer) 05/27/2019    Severe sepsis: SIRS criteria met on admission with tachy, elevated WBC count, tachypnea.  No true fever.  Treating above.  Lactate elevated on admission, but has normalized. UA with + LE, rare bacteria - follow urine cx Follow blood cx, urine strep and legionella Suspect this is all 2/2 COVID 19 infection - follow cx as noted above Lactate normalized - caution with IVF in setting of AHRF above  Sinus Tachycardia: EKG with sinus tach, similar to priors.  Follow with treatment of above.  Will resume home diltiazem.   AKI: baseline creatinine less than 1.  Peaked to 1.73.  Improving at this time.  Follow.  Hold thiazide and ace at this time.  Type 2 diabetes mellitus: Not on insulin as an outpatient, but rather is on dapagliflozin as well as Janumet.   Start levemir BID SSI.  Consider addition of mealtime insulin.  Hypertension:  restart diltiazem.  Hold HCTZ and lisinopril.  ADHD: On Adderall as an outpatient.  Hold for now.  Hypothyroidism: resume synthroid  Headaches: pt uses stadol for migraines - ordered x1 today for migraine this AM  Anxiety  Depression: valium prn at home - currently on hold with resp distress - continue to monitor.  Continue trazodone nightly.  Continue viibryd.  DVT prophylaxis: heparin Code Status: full  Family Communication: none at bedside Disposition Plan: pending further  improvement  Consultants:   PCCM  Procedures:   none  Antimicrobials:  Anti-infectives (From admission, onward)   Start     Dose/Rate Route Frequency Ordered Stop   06/06/19 1000  remdesivir 100 mg in sodium chloride 0.9 % 250 mL IVPB     100 mg 500 mL/hr over 30 Minutes Intravenous Every 24 hours 06/05/19 0743 06/10/19 0959   06/05/19 1000  cefTRIAXone (ROCEPHIN) 2 g in sodium chloride 0.9 % 100 mL IVPB     2 g 200 mL/hr over 30 Minutes Intravenous Every 24 hours 06/05/19 0831 06/10/19 0959   06/05/19 1000  azithromycin (ZITHROMAX) 500 mg in sodium chloride 0.9 % 250 mL IVPB     500 mg 250 mL/hr over 60 Minutes Intravenous Every 24 hours 06/05/19 0831 06/10/19 0959   06/05/19 0830  remdesivir 200 mg in sodium chloride 0.9 % 250 mL IVPB     200 mg 500 mL/hr over 30 Minutes Intravenous Once 06/05/19 0743 06/05/19 1025     Subjective: C/o headache and SOB and cough  Objective: Vitals:   06/05/19 0845 06/05/19 0900 06/05/19 0915 06/05/19 0930  BP: (!) 154/85 136/75 139/73 (!) 144/87  Pulse: (!) 108 (!) 106 (!) 105 (!) 107  Resp:      Temp:      TempSrc:      SpO2: 97% 95% 96% 96%   No intake or output data in the 24 hours ending 06/05/19 0951 There were no vitals filed for this visit.  Examination:  General exam: Appears calm and comfortable  Respiratory system: slightly increased WOB Cardiovascular system: tachy, regular rate Gastrointestinal system: Abdomen is nondistended, soft and nontender Central nervous system: Alert and oriented. No focal neurological deficits. Extremities: no LEE Skin: No rashes, lesions or ulcers Psychiatry: Judgement and insight appear normal. Mood & affect appropriate.     Data Reviewed: I have personally reviewed following labs and imaging studies  CBC: Recent Labs  Lab 06/04/19 1856 06/04/19 2057 06/05/19 0426 06/05/19 0517 06/05/19 0700  WBC 11.8*  --   --   --  11.2*  NEUTROABS  --   --   --   --  9.3*  HGB 12.0 11.9*  12.9 13.3 12.1  HCT 38.4 35.0* 38.0 39.0 38.7  MCV 72.2*  --   --   --  71.5*  PLT 314  --   --   --  875   Basic Metabolic Panel: Recent Labs  Lab 06/04/19 1918 06/04/19 2057 06/05/19 0056 06/05/19 0426 06/05/19 0517 06/05/19 0700  NA 137 132*  --  134* 134* 134*  K 4.5 3.8  --  4.3 4.2 4.5  CL 97*  --   --   --   --  94*  CO2 23  --   --   --   --  22  GLUCOSE 377*  --   --   --   --  317*  BUN 31*  --   --   --   --  25*  CREATININE 1.73*  --   --   --   --  1.24*  CALCIUM 8.4*  --   --   --   --  8.0*  MG  --   --  2.6*  --   --  2.4  PHOS  --   --   --   --   --  3.2   GFR: CrCl cannot be calculated (Unknown ideal weight.). Liver Function Tests: Recent Labs  Lab 06/04/19 1918 06/05/19 0700  AST 30 30  ALT 32 29  ALKPHOS 57 67  BILITOT 0.4 0.6  PROT 7.6 7.1  ALBUMIN 2.9* 2.7*   No results for input(s): LIPASE, AMYLASE in the last 168 hours. No results for input(s): AMMONIA in the last 168 hours. Coagulation Profile: No results for input(s): INR, PROTIME in the last 168 hours. Cardiac Enzymes: No results for input(s): CKTOTAL, CKMB, CKMBINDEX, TROPONINI in the last 168 hours. BNP (last 3 results) No results for input(s): PROBNP in the last 8760 hours. HbA1C: No results for input(s): HGBA1C in the last 72 hours. CBG: Recent Labs  Lab 06/04/19 2350 06/05/19 0623 06/05/19 0949  GLUCAP 322* 297* 235*   Lipid Profile: Recent Labs    06/04/19 1900  TRIG 158*   Thyroid Function Tests: No results for input(s): TSH, T4TOTAL, FREET4, T3FREE, THYROIDAB in the last 72 hours. Anemia Panel: Recent Labs    06/04/19 1918 06/05/19 0700  FERRITIN 55 47   Sepsis Labs: Recent Labs  Lab 06/04/19 1918 06/05/19 0055  PROCALCITON 1.11  --   LATICACIDVEN  --  1.5    Recent Results (from the past 240 hour(s))  Novel Coronavirus, NAA ([facility])     Status: Abnormal   Collection Time: 05/27/19 12:00 AM   Specimen: Nasopharyngeal(NP) swabs in vial transport  medium   NASOPHARYNGE  TESTING  Result Value Ref Range Status   SARS-CoV-2, NAA Detected (Rillie Riffel) Not Detected Final    Comment: This nucleic acid amplification test was developed and its performance characteristics determined by [employer]. Nucleic acid amplification tests include PCR and TMA. This test has not been FDA cleared or approved. This test has been authorized by FDA under an Emergency Use Authorization (EUA). This test is only authorized for the duration of time the declaration that circumstances exist justifying the authorization of the emergency use of in vitro diagnostic tests for detection of SARS-CoV-2 virus and/or diagnosis of COVID-19 infection under section 564(b)(1) of the Act, 21 U.S.C. 094MHW-8(G) (1), unless the authorization is terminated or revoked sooner. When diagnostic testing is negative, the possibility of Paola Aleshire false negative result should be considered in the context of Abbigayle Toole patient's recent exposures and the presence of clinical signs and symptoms consistent with COVID-19. An individual without symptoms of COVID-19 and who is not shedding SARS-CoV-2 virus would  expect to have Makya Yurko negative (not detected) result in this assay.          Radiology Studies: Xr Chest Portable  Result Date: 06/04/2019 CLINICAL DATA:  Shortness of breath.  Coronavirus infection. EXAM: PORTABLE CHEST 1 VIEW COMPARISON:  08/19/2018 FINDINGS: Heart size upper limits of normal. Extensive bilateral patchy pulmonary infiltrates consistent with viral pneumonia. No dense consolidation, collapse or effusion. No significant bone finding. IMPRESSION: Extensive patchy bilateral pulmonary infiltrates consistent with viral pneumonia. Electronically Signed   By: Nelson Chimes M.D.   On: 06/04/2019 19:12        Scheduled Meds: . aspirin EC  81 mg Oral Daily  .  heparin  7,500 Units Subcutaneous Q8H  . insulin aspart  0-20 Units Subcutaneous Q4H  . insulin detemir  7 Units Subcutaneous  BID  . levothyroxine  224 mcg Oral QAC breakfast  . methylPREDNISolone (SOLU-MEDROL) injection  60 mg Intravenous Q12H  . sodium chloride flush  3 mL Intravenous Q12H   Continuous Infusions: . azithromycin    . cefTRIAXone (ROCEPHIN)  IV    . famotidine (PEPCID) IV    . remdesivir 200 mg in NS 250 mL     Followed by  . [START ON 06/06/2019] remdesivir 100 mg in NS 250 mL       LOS: 1 day    Time spent: over 30 min 50 min critical care time with AHRF 2/2 COVID 19 pneumonia    Fayrene Helper, MD [facility] Pager AMION  If 7PM-7AM, please contact night-coverage [email or web address] Password [facility] 06/05/2019, 9:51 AM

## 2019-06-05 NOTE — Progress Notes (Signed)
RT called by RN for pt being in distress and SATs maintaining in 70s. When RT arrived, pt's SATs had increased and sustaining at 80%. FIO2 on HHFNC increased to 100%. Pt's RR is between 35-50 times a minute. She is able to sit herself up in bed and answer questions appropriately. While RT and RN were beside, pt's SATs with a clear waveform dropped and sustained into the 30s. Pt instructed to close her mouth and breath through her nose only. SATs increased to 83%. 15L NRB placed on pt to help keep SATs up because she may be taking breaths through her mouth vs nose. SATs are now 90%. CCM notified.

## 2019-06-05 NOTE — ED Notes (Signed)
Meal tray ordered for pt  

## 2019-06-05 NOTE — Progress Notes (Signed)
Charge RT spoke with MD regarding the second abg result. Charge RT made MD aware that pt. Has been taking off oxygen and eating. MD stated to just follow sat monitor for now.

## 2019-06-05 NOTE — Consult Note (Addendum)
NAME:  Brittany Rubio, MRN:  [id number], DOB:  09/10/1960, LOS: 1 ADMISSION DATE:  06/04/2019, CONSULTATION DATE: 06/05/2019 REFERRING MD: Charisse March hospitalist, CHIEF COMPLAINT: Hypoxia  Brief History   58 year old woman with 3-day history of worsening dyspnea.  Found to have COVID-19 pneumonia.  Requiring ICU admission for high oxygen requirements.  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  She believes that she contracted from her husband who was sick himself.  She complains generalized malaise severe headache and diarrhea.  No nausea and vomiting, no myalgias.  She reports shortness of breath only on exertion.  Cough is likewise only exertional.  Past medical history notable for type 2 diabetes, obstructive sleep apnea, frequent migraines with aura, and remote breast and thyroid cancers.  Past Medical History   Past Medical History:  Diagnosis Date  . ADD (attention deficit disorder)   . Anemia   . Anxiety   . Arthritis    "left shoulder" (07/07/2014)  . Back pain   . Breast cancer ([place])    "left"  . Depression   . Fatty liver   . Food allergy    shellfish  . GERD (gastroesophageal reflux disease)    "takes over the counters as needed"  . Hernia, umbilical   . High cholesterol   . Hypertension 11/26/2011   sees Dr. Bryon Lions  . Hypothyroidism   . Joint pain   . Migraines    "maybe once q 3 months" (07/07/2014)  . Multinodular thyroid 2015  . OSA on CPAP   . Personal history of radiation therapy   . Pneumonia    May 2018  . Thyroid cancer ([place])    2015  . Type II diabetes mellitus ([place])   . Vitamin D deficiency    Past Surgical History:  Procedure Laterality Date  . ABDOMINAL HYSTERECTOMY  ? 1997  . BREAST EXCISIONAL BIOPSY Right 2014  . BREAST LUMPECTOMY Left 2009  . BREAST LUMPECTOMY Right 07/2012   "not a  mastectomy"  . BREAST REDUCTION SURGERY Bilateral   . DIAPHRAGM SURGERY  1986   "trauma"  . INCISIONAL HERNIA REPAIR N/A 06/05/2018   Procedure: LAPAROSCOPIC INCISIONAL HERNIA ERAS PATHWAY;  Surgeon: Erroll Luna, MD;  Location: [place];  Service: General;  Laterality: N/A;  . INSERTION OF MESH N/A 06/05/2018   Procedure: INSERTION OF MESH;  Surgeon: Erroll Luna, MD;  Location: [place];  Service: General;  Laterality: N/A;  . PARTIAL MASTECTOMY WITH NEEDLE LOCALIZATION  08/12/2012   Procedure: PARTIAL MASTECTOMY WITH NEEDLE LOCALIZATION;  Surgeon: Joyice Faster. Cornett, MD;  Location: [place];  Service: General;  Laterality: Right;  . REDUCTION MAMMAPLASTY Bilateral 1995  . THYROIDECTOMY Left 07/07/2014   Procedure: LEFT HEMI-THYROIDECTOMY;  Surgeon: Ascencion Dike, MD;  Location: [place];  Service: ENT;  Laterality: Left;  . THYROIDECTOMY Right 07/08/2014   Procedure: Completion THYROIDECTOMY;  Surgeon: Ascencion Dike, MD;  Location: [place];  Service: ENT;  Laterality: Right;  . THYROIDECTOMY, PARTIAL Left 07/07/2014   hemi     Significant Hospital Events   Admitted via the ED 11/19 complaining of shortness of breath. No beds available at [facility], hospitalist service to admit at [facility]. 11/20 PCCM asked to admit to ICU for increasing oxygen requirements.  Consults:  PCCM  Procedures:  None  Significant Diagnostic Tests:  11/19-chest x-ray:  Bilateral patchy interstitial alveolar infiltrates consistent with viral pneumonia (personally reviewed)  Micro Data:   SARS-CoV-2, NAA Not Detected DetectedAbnormal     Antimicrobials:  Decadron 6 mg daily 11/19 Remdesivir 400 mg daily 11/20  Interim history/subjective:  She denies shortness of breath apart from with movement.  Objective   Blood pressure (!) 144/87, pulse (!) 107, temperature 99.8 F (37.7 C), temperature source Oral, resp. rate (!) 34, SpO2 96 %.    FiO2 (%):  [80 %-100 %] 80 %  No intake or output data in the 24 hours ending  06/05/19 1027 There were no vitals filed for this visit.  Examination: General: Obese woman in no apparent distress. HENT: Mucous membranes are moist.  Sclera are anicteric. Lungs: No accessory muscle use.  Normal vesicular breath sounds throughout with crackles at both bases. Cardiovascular: JVP is not elevated there is no peripheral edema.  First and second heart sounds are unremarkable with no murmurs or gallops.  Extremities are warm and well-perfused. Abdomen: Abdomen is soft and nontender.  Bowel sounds are active. Extremities: No evidence of active joint disease. Neuro: Sensorium is clear, no focal neurological deficits. GU: Patient is voiding spontaneously.  Resolved Hospital Problem list   None  Assessment & Plan:  Critically ill due to hypoxic respiratory failure requiring titration of high heated flow oxygen. SIRS syndrome secondary to Covid 19. COVID-19 related diarrhea Acute kidney injury Type 2 diabetes at risk of worsening on steroids Hypertension at home. Migraine headaches  Severe respiratory failure with relatively well-tolerated hypoxia.  Plan to continue high flow oxygen and current antiviral treatments.  Intubate for increased work of breathing and respiratory distress.  Gentle hydration as volume contraction from diarrhea may account for acute renal injury.   Daily Goals Checklist  Pain/Anxiety/Delirium protocol (if indicated): Stadol prn for migraines VAP protocol (if indicated): not currently intubated Respiratory support goals: continue to titrate HFNC. Encourage self proning and incentive spirometry.  Consider intubation for increasing respiratory distress.  Tolerate oxygen saturation as low as 86% if no distress. Blood pressure target: Keep MAP> 65 DVT prophylaxis: discussed with Dr Florene Glen, given high D-Dimer, will use full dose anticoagulation and obtain echo and leg Dopplers.  Nutritional status and feeding goals: low nutritional risk. Clear fluids for  now and can advance if oxygen requirements remain stable.  GI prophylaxis: famotidine Fluid status goals: allow autoregulation. Urinary catheter: voiding unassited Central lines: peripheral iv's Glucose control: basal bolus insulin Mobility/therapy needs: OOB Antibiotic de-escalation: complete course of remdesivir and decadron. Home medication reconciliation: hold antihypertensive medications and DM meds. Continue antidepressants. Daily labs: CBC, BMP and inflammatory markers every 2 days. Code Status: Full Family Communication: have not updated husband. Disposition: ICU   Labs   CBC: Recent Labs  Lab 06/04/19 1856 06/04/19 2057 06/05/19 0426 06/05/19 0517 06/05/19 0700  WBC 11.8*  --   --   --  11.2*  NEUTROABS  --   --   --   --  9.3*  HGB 12.0 11.9* 12.9 13.3 12.1  HCT 38.4 35.0* 38.0 39.0 38.7  MCV 72.2*  --   --   --  71.5*  PLT 314  --   --   --  99991111    Basic Metabolic Panel: Recent Labs  Lab 06/04/19 1918 06/04/19 2057 06/05/19 0056 06/05/19 0426 06/05/19 0517 06/05/19 0700  NA 137 132*  --  134* 134* 134*  K 4.5 3.8  --  4.3 4.2 4.5  CL 97*  --   --   --   --  94*  CO2 23  --   --   --   --  22  GLUCOSE 377*  --   --   --   --  317*  BUN 31*  --   --   --   --  25*  CREATININE 1.73*  --   --   --   --  1.24*  CALCIUM 8.4*  --   --   --   --  8.0*  MG  --   --  2.6*  --   --  2.4  PHOS  --   --   --   --   --  3.2   GFR: CrCl cannot be calculated (Unknown ideal weight.). Recent Labs  Lab 06/04/19 1856 06/04/19 1918 06/05/19 0055 06/05/19 0700  PROCALCITON  --  1.11  --   --   WBC 11.8*  --   --  11.2*  LATICACIDVEN  --   --  1.5  --     Liver Function Tests: Recent Labs  Lab 06/04/19 1918 06/05/19 0700  AST 30 30  ALT 32 29  ALKPHOS 57 67  BILITOT 0.4 0.6  PROT 7.6 7.1  ALBUMIN 2.9* 2.7*   No results for input(s): LIPASE, AMYLASE in the last 168 hours. No results for input(s): AMMONIA in the last 168 hours.  ABG    Component  Value Date/Time   PHART 7.452 (H) 06/05/2019 0517   PCO2ART 34.3 06/05/2019 0517   PO2ART 22.0 (LL) 06/05/2019 0517   HCO3 23.8 06/05/2019 0517   TCO2 25 06/05/2019 0517   ACIDBASEDEF 3.9 (H) 11/27/2016 0411   O2SAT 38.0 06/05/2019 0517     Coagulation Profile: No results for input(s): INR, PROTIME in the last 168 hours.  Cardiac Enzymes: No results for input(s): CKTOTAL, CKMB, CKMBINDEX, TROPONINI in the last 168 hours.  HbA1C: Hgb A1c MFr Bld  Date/Time Value Ref Range Status  05/18/2019 05:06 PM 8.7 (H) 4.8 - 5.6 % Final    Comment:             Prediabetes: 5.7 - 6.4          Diabetes: >6.4          Glycemic control for adults with diabetes: <7.0   02/10/2019 12:33 PM 7.7 (H) 4.8 - 5.6 % Final    Comment:             Prediabetes: 5.7 - 6.4          Diabetes: >6.4          Glycemic control for adults with diabetes: <7.0     CBG: Recent Labs  Lab 06/04/19 2350 06/05/19 0623 06/05/19 0949  GLUCAP 322* 297* 235*    Review of Systems:   Review of Systems  All other systems reviewed and are negative.    Past Medical History  She,  has a past medical history of ADD (attention deficit disorder), Anemia, Anxiety, Arthritis, Back pain, Breast cancer ([place]), Depression, Fatty liver, Food allergy, GERD (gastroesophageal reflux disease), Hernia, umbilical, High cholesterol, Hypertension (11/26/2011), Hypothyroidism, Joint pain, Migraines, Multinodular thyroid (2015), OSA on CPAP, Personal history of radiation therapy, Pneumonia, Thyroid cancer ([place]), Type II diabetes mellitus ([place]), and Vitamin D deficiency.   Surgical History    Past Surgical History:  Procedure Laterality Date  . ABDOMINAL HYSTERECTOMY  ? 1997  . BREAST EXCISIONAL BIOPSY Right 2014  . BREAST LUMPECTOMY Left 2009  . BREAST LUMPECTOMY Right 07/2012   "not  a mastectomy"  . BREAST REDUCTION SURGERY Bilateral   . DIAPHRAGM SURGERY  1986   "trauma"  . INCISIONAL HERNIA REPAIR N/A 06/05/2018   Procedure:  LAPAROSCOPIC INCISIONAL HERNIA ERAS PATHWAY;  Surgeon: Erroll Luna, MD;  Location: [place];  Service: General;  Laterality: N/A;  . INSERTION OF MESH N/A 06/05/2018   Procedure: INSERTION OF MESH;  Surgeon: Erroll Luna, MD;  Location: [place];  Service: General;  Laterality: N/A;  . PARTIAL MASTECTOMY WITH NEEDLE LOCALIZATION  08/12/2012   Procedure: PARTIAL MASTECTOMY WITH NEEDLE LOCALIZATION;  Surgeon: Joyice Faster. Cornett, MD;  Location: [place];  Service: General;  Laterality: Right;  . REDUCTION MAMMAPLASTY Bilateral 1995  . THYROIDECTOMY Left 07/07/2014   Procedure: LEFT HEMI-THYROIDECTOMY;  Surgeon: Ascencion Dike, MD;  Location: [place];  Service: ENT;  Laterality: Left;  . THYROIDECTOMY Right 07/08/2014   Procedure: Completion THYROIDECTOMY;  Surgeon: Ascencion Dike, MD;  Location: [place];  Service: ENT;  Laterality: Right;  . THYROIDECTOMY, PARTIAL Left 07/07/2014   hemi     Social History   reports that she has never smoked. She has never used smokeless tobacco. She reports current alcohol use. She reports that she does not use drugs.   Family History   Her family history includes Alcoholism in her father; Cancer in her mother; Depression in her mother; Diabetes in her mother; Hypertension in her father and mother.   Allergies Allergies  Allergen Reactions  . Crab [Shellfish Allergy] Hives and Swelling    "Only when I eat too many crab legs" Can tolerate CT scans with contrast-does not need premeds       Home Medications  Prior to Admission medications   Medication Sig Start Date End Date Taking? Authorizing Provider  albuterol (VENTOLIN HFA) 108 (90 Base) MCG/ACT inhaler Inhale 2 puffs into the lungs every 6 (six) hours as needed for wheezing or shortness of breath. 06/01/19  Yes Hawks, Christy A, FNP  amphetamine-dextroamphetamine (ADDERALL XR) 20 MG 24 hr capsule Take 1 capsule (20 mg total) by mouth daily. Take with 30 mg to equal 50 mg daily 11/11/18  Yes Glendale Chard, MD   amphetamine-dextroamphetamine (ADDERALL XR) 30 MG 24 hr capsule Take 30 mg by mouth daily. Take with 20 mg to equal 50 mg daily   Yes [provider]  amphetamine-dextroamphetamine (ADDERALL) 10 MG tablet Take 10 mg by mouth daily in the afternoon.  01/12/19  Yes [provider]  aspirin EC 81 MG tablet Take 81 mg by mouth daily.   Yes [provider]  azithromycin (ZITHROMAX) 250 MG tablet Take 2 tablets (500 mg) on  Day 1,  followed by 1 tablet (250 mg) once daily on Days 2 through 5. 06/02/19 06/07/19 Yes Glendale Chard, MD  benzonatate (TESSALON PERLES) 100 MG capsule Take 1 capsule (100 mg total) by mouth 3 (three) times daily as needed. 06/01/19  Yes Hawks, Christy A, FNP  butorphanol (STADOL) 10 MG/ML nasal spray PLACE 1 SPRAY INTO THE NOSE 3 (THREE) TIMES DAILY AS NEEDED FOR MIGRAINE. 04/25/19  Yes Glendale Chard, MD  cholecalciferol (VITAMIN D3) 25 MCG (1000 UT) tablet Take 1,000 Units by mouth daily.   Yes [provider]  Cyanocobalamin (VITAMIN B-12 PO) Take 1 tablet by mouth once a week. Tuesdays   Yes [provider]  diazepam (VALIUM) 5 MG tablet TAKE 1 TABLET BY MOUTH DAILY AS NEEDED FOR ANXIETY. Patient taking differently: Take 5 mg by mouth daily as needed for anxiety.  05/07/19  Yes Glendale Chard, MD  diltiazem (TIAZAC) 240 MG 24 hr capsule TAKE 1 TABLET BY MOUTH AT BEDTIME FOR BP Patient taking differently: Take 240 mg by mouth at bedtime.  02/11/19  Yes Glendale Chard, MD  EPINEPHrine (EPIPEN 2-PAK) 0.3 mg/0.3 mL IJ SOAJ injection 0.3 mLs by Subdermal route once as needed. Allergic reaction 12/07/14  Yes [provider]  esomeprazole (NEXIUM) 20 MG capsule Take 20 mg by mouth daily as needed (acid reflux).    Yes [provider]  FARXIGA 10 MG TABS tablet TAKE 1 TABLET BY MOUTH EVERY DAY Patient taking differently: Take 10 mg by mouth daily.  04/02/19  Yes Glendale Chard, MD  fexofenadine (ALLEGRA) 180 MG tablet Take  180 mg by mouth daily as needed for allergies or rhinitis.   Yes [provider]  glucose blood (ONETOUCH ULTRA) test strip Test twice daily 12/23/18  Yes Jearld Lesch A, DO  JANUMET 50-1000 MG tablet TAKE 1 TABLET BY MOUTH TWICE A DAY WITH MEALS Patient taking differently: Take 1 tablet by mouth 2 (two) times daily with a meal.  05/18/19  Yes Glendale Chard, MD  Lancets [facility] ULTRASOFT) lancets Test twice daily 12/23/18  Yes Jearld Lesch A, DO  levothyroxine (SYNTHROID, LEVOTHROID) 112 MCG tablet Take 224 mcg by mouth every morning.    Yes [provider]  lisinopril-hydrochlorothiazide (ZESTORETIC) 20-25 MG tablet TAKE 1 TABLET BY MOUTH EVERY DAY Patient taking differently: Take 1 tablet by mouth daily.  03/11/19  Yes Glendale Chard, MD  Magnesium 500 MG CAPS Take 500 mg by mouth daily.    Yes [provider]  omega-3 acid ethyl esters (LOVAZA) 1 g capsule Take 1 g by mouth daily.    Yes [provider]  promethazine (PHENERGAN) 25 MG tablet Take 25 mg by mouth every 8 (eight) hours as needed for nausea or vomiting.  03/13/17  Yes [provider]  ranitidine (ZANTAC) 150 MG tablet Take 150-300 mg by mouth See admin instructions. Take 300 mg in the morning and may take an additional 150 mg at night as needed for heartburn   Yes [provider]  traZODone (DESYREL) 50 MG tablet Take 2 tabs po qhs Patient taking differently: Take 100 mg by mouth at bedtime.  11/11/18  Yes Glendale Chard, MD  Turmeric Curcumin 500 MG CAPS Take 500 mg by mouth daily.    Yes [provider]  VIIBRYD 40 MG TABS TAKE 1 TABLET BY MOUTH EVERY DAY WITH FOOD Patient taking differently: Take 40 mg by mouth daily. with food. 05/04/19  Yes Glendale Chard, MD  vitamin C (ASCORBIC ACID) 500 MG tablet Take 500 mg by mouth daily.   Yes [provider]    CRITICAL CARE Performed by: Kipp Brood   Total critical care time: 40 minutes  Critical care time was  exclusive of separately billable procedures and treating other patients.  Critical care was necessary to treat or prevent imminent or life-threatening deterioration.  Critical care was time spent personally by me on the following activities: development of treatment plan with patient and/or surrogate as well as nursing, discussions with consultants, evaluation of patient's response to treatment, examination of patient, obtaining history from patient or surrogate, ordering and performing treatments and interventions, ordering and review of laboratory studies, ordering and review of radiographic studies, pulse oximetry, re-evaluation of patient's condition and participation in multidisciplinary rounds.  Kipp Brood, MD [facility] ICU Physician [place]  Pager: [phone] Mobile: [phone]  After hours: [phone].

## 2019-06-05 NOTE — ED Notes (Signed)
Secretary paged critical care MD to primary RN Dorian Pod

## 2019-06-05 NOTE — Progress Notes (Signed)
Repeat ABG obtained on room air.  Patient sitting on side of stretcher, she had taken off HFNC to eat, sats 97% on monitor.  Patient reclined back on stretcher, abg drawn.  Reapplied HFNC after puncture, sats returned to 97% on 100%, 40LPM.

## 2019-06-05 NOTE — ED Notes (Signed)
Pt is having echo done at the bedside. Pt appears more tired but is able to answer all my questions.

## 2019-06-05 NOTE — ED Notes (Signed)
Spoke with PCCM

## 2019-06-05 NOTE — Progress Notes (Signed)
  Echocardiogram 2D Echocardiogram has been performed.  Jennette Dubin 06/05/2019, 4:42 PM

## 2019-06-05 NOTE — ED Notes (Signed)
Pt is resting, high flow O2 at 45Liters and NRB at 15L are in place.

## 2019-06-05 NOTE — ED Notes (Signed)
Carb modified dinner tray ordered 

## 2019-06-06 ENCOUNTER — Inpatient Hospital Stay (HOSPITAL_COMMUNITY): Payer: BC Managed Care – PPO

## 2019-06-06 DIAGNOSIS — J1289 Other viral pneumonia: Secondary | ICD-10-CM

## 2019-06-06 DIAGNOSIS — N179 Acute kidney failure, unspecified: Secondary | ICD-10-CM

## 2019-06-06 DIAGNOSIS — R7989 Other specified abnormal findings of blood chemistry: Secondary | ICD-10-CM

## 2019-06-06 DIAGNOSIS — J9621 Acute and chronic respiratory failure with hypoxia: Secondary | ICD-10-CM

## 2019-06-06 LAB — COMPREHENSIVE METABOLIC PANEL
ALT: 25 U/L (ref 0–44)
AST: 36 U/L (ref 15–41)
Albumin: 2.6 g/dL — ABNORMAL LOW (ref 3.5–5.0)
Alkaline Phosphatase: 117 U/L (ref 38–126)
Anion gap: 16 — ABNORMAL HIGH (ref 5–15)
BUN: 18 mg/dL (ref 6–20)
CO2: 26 mmol/L (ref 22–32)
Calcium: 8.1 mg/dL — ABNORMAL LOW (ref 8.9–10.3)
Chloride: 98 mmol/L (ref 98–111)
Creatinine, Ser: 0.85 mg/dL (ref 0.44–1.00)
GFR calc Af Amer: >60 mL/min (ref >60)
GFR calc non Af Amer: >60 mL/min (ref >60)
Glucose, Bld: 118 mg/dL — ABNORMAL HIGH (ref 70–99)
Potassium: 4.3 mmol/L (ref 3.5–5.1)
Sodium: 140 mmol/L (ref 135–145)
Total Bilirubin: 0.6 mg/dL (ref 0.3–1.2)
Total Protein: 7.1 g/dL (ref 6.5–8.1)

## 2019-06-06 LAB — CBC WITH DIFFERENTIAL/PLATELET
Abs Immature Granulocytes: 0.54 10*3/uL — ABNORMAL HIGH (ref 0.00–0.07)
Basophils Absolute: 0 10*3/uL (ref 0.0–0.1)
Basophils Relative: 0 %
Eosinophils Absolute: 0 10*3/uL (ref 0.0–0.5)
Eosinophils Relative: 0 %
HCT: 38.5 % (ref 36.0–46.0)
Hemoglobin: 11.8 g/dL — ABNORMAL LOW (ref 12.0–15.0)
Immature Granulocytes: 3 %
Lymphocytes Relative: 8 %
Lymphs Abs: 1.3 10*3/uL (ref 0.7–4.0)
MCH: 22.1 pg — ABNORMAL LOW (ref 26.0–34.0)
MCHC: 30.6 g/dL (ref 30.0–36.0)
MCV: 72 fL — ABNORMAL LOW (ref 80.0–100.0)
Monocytes Absolute: 1.3 10*3/uL — ABNORMAL HIGH (ref 0.1–1.0)
Monocytes Relative: 8 %
Neutro Abs: 12.7 10*3/uL — ABNORMAL HIGH (ref 1.7–7.7)
Neutrophils Relative %: 81 %
Platelets: 262 10*3/uL (ref 150–400)
RBC: 5.35 MIL/uL — ABNORMAL HIGH (ref 3.87–5.11)
RDW: 17.4 % — ABNORMAL HIGH (ref 11.5–15.5)
WBC: 15.8 10*3/uL — ABNORMAL HIGH (ref 4.0–10.5)
nRBC: 1.8 % — ABNORMAL HIGH (ref 0.0–0.2)

## 2019-06-06 LAB — URINE CULTURE: Culture: <10000 — AB

## 2019-06-06 LAB — PREPARE FRESH FROZEN PLASMA: Unit division: 0

## 2019-06-06 LAB — GLUCOSE, CAPILLARY
Glucose-Capillary: 113 mg/dL — ABNORMAL HIGH (ref 70–99)
Glucose-Capillary: 133 mg/dL — ABNORMAL HIGH (ref 70–99)
Glucose-Capillary: 167 mg/dL — ABNORMAL HIGH (ref 70–99)
Glucose-Capillary: 168 mg/dL — ABNORMAL HIGH (ref 70–99)
Glucose-Capillary: 173 mg/dL — ABNORMAL HIGH (ref 70–99)
Glucose-Capillary: 85 mg/dL (ref 70–99)

## 2019-06-06 LAB — C-REACTIVE PROTEIN: CRP: 20.2 mg/dL — ABNORMAL HIGH (ref <1.0)

## 2019-06-06 LAB — BPAM FFP
Blood Product Expiration Date: 202011211325
ISSUE DATE / TIME: 202011201429
Unit Type and Rh: 6200

## 2019-06-06 LAB — MAGNESIUM: Magnesium: 2 mg/dL (ref 1.7–2.4)

## 2019-06-06 LAB — HEPARIN LEVEL (UNFRACTIONATED)
Heparin Unfractionated: 0.45 IU/mL (ref 0.30–0.70)
Heparin Unfractionated: 0.29 IU/mL — ABNORMAL LOW (ref 0.30–0.70)

## 2019-06-06 LAB — FERRITIN: Ferritin: 43 ng/mL (ref 11–307)

## 2019-06-06 LAB — D-DIMER, QUANTITATIVE: D-Dimer, Quant: >20 ug/mL-FEU — ABNORMAL HIGH (ref 0.00–0.50)

## 2019-06-06 MED ORDER — POTASSIUM CHLORIDE CRYS ER 20 MEQ PO TBCR
40.0000 meq | EXTENDED_RELEASE_TABLET | Freq: Three times a day (TID) | ORAL | Status: AC
  Administered 2019-06-06 (×2): 40 meq via ORAL
  Filled 2019-06-06 (×2): qty 2

## 2019-06-06 MED ORDER — METOLAZONE 5 MG PO TABS
10.0000 mg | ORAL_TABLET | Freq: Once | ORAL | Status: AC
  Administered 2019-06-06: 10 mg via ORAL
  Filled 2019-06-06 (×2): qty 1
  Filled 2019-06-06: qty 2

## 2019-06-06 MED ORDER — FUROSEMIDE 10 MG/ML IJ SOLN
40.0000 mg | Freq: Four times a day (QID) | INTRAMUSCULAR | Status: AC
  Administered 2019-06-06 (×3): 40 mg via INTRAVENOUS
  Filled 2019-06-06 (×3): qty 4

## 2019-06-06 MED ORDER — WHITE PETROLATUM EX OINT
TOPICAL_OINTMENT | CUTANEOUS | Status: AC
  Administered 2019-06-06: 1
  Filled 2019-06-06: qty 28.35

## 2019-06-06 MED ORDER — METOPROLOL TARTRATE 5 MG/5ML IV SOLN
2.5000 mg | Freq: Four times a day (QID) | INTRAVENOUS | Status: DC | PRN
  Administered 2019-06-06 – 2019-06-07 (×3): 5 mg via INTRAVENOUS
  Filled 2019-06-06 (×3): qty 5

## 2019-06-06 MED ORDER — ALPRAZOLAM 0.25 MG PO TABS
0.2500 mg | ORAL_TABLET | Freq: Once | ORAL | Status: AC
  Administered 2019-06-06: 0.25 mg via ORAL
  Filled 2019-06-06: qty 1

## 2019-06-06 NOTE — Progress Notes (Signed)
VASCULAR LAB PRELIMINARY  PRELIMINARY  PRELIMINARY  PRELIMINARY  Bilateral lower extremity venous duplex completed.    Preliminary report:  See CV proc for preliminary results.   Maurine Minister, RN, report.  Estell Puccini, RVT 06/06/2019, 7:18 PM

## 2019-06-06 NOTE — Consult Note (Signed)
NAME:  Brittany Rubio, MRN:  [id number], DOB:  12/03/1960, LOS: 2 ADMISSION DATE:  06/04/2019, CONSULTATION DATE: 06/05/2019 REFERRING MD: Charisse March hospitalist, CHIEF COMPLAINT: Hypoxia  Brief History   58 year old woman with 3-day history of worsening dyspnea.  Found to have COVID-19 pneumonia.  Requiring ICU admission for high oxygen requirements.  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  She believes that she contracted from her husband who was sick himself.  She complains generalized malaise severe headache and diarrhea.  No nausea and vomiting, no myalgias.  She reports shortness of breath only on exertion.  Cough is likewise only exertional.  Past medical history notable for type 2 diabetes, obstructive sleep apnea, frequent migraines with aura, and remote breast and thyroid cancers.  Past Medical History   Past Medical History:  Diagnosis Date  . ADD (attention deficit disorder)   . Anemia   . Anxiety   . Arthritis    "left shoulder" (07/07/2014)  . Back pain   . Breast cancer ([place])    "left"  . Depression   . Fatty liver   . Food allergy    shellfish  . GERD (gastroesophageal reflux disease)    "takes over the counters as needed"  . Hernia, umbilical   . High cholesterol   . Hypertension 11/26/2011   sees Dr. Bryon Lions  . Hypothyroidism   . Joint pain   . Migraines    "maybe once q 3 months" (07/07/2014)  . Multinodular thyroid 2015  . OSA on CPAP   . Personal history of radiation therapy   . Pneumonia    May 2018  . Thyroid cancer ([place])    2015  . Type II diabetes mellitus ([place])   . Vitamin D deficiency    Past Surgical History:  Procedure Laterality Date  . ABDOMINAL HYSTERECTOMY  ? 1997  . BREAST EXCISIONAL BIOPSY Right 2014  . BREAST LUMPECTOMY Left 2009  . BREAST LUMPECTOMY Right 07/2012   "not a  mastectomy"  . BREAST REDUCTION SURGERY Bilateral   . DIAPHRAGM SURGERY  1986   "trauma"  . INCISIONAL HERNIA REPAIR N/A 06/05/2018   Procedure: LAPAROSCOPIC INCISIONAL HERNIA ERAS PATHWAY;  Surgeon: Erroll Luna, MD;  Location: [place];  Service: General;  Laterality: N/A;  . INSERTION OF MESH N/A 06/05/2018   Procedure: INSERTION OF MESH;  Surgeon: Erroll Luna, MD;  Location: [place];  Service: General;  Laterality: N/A;  . PARTIAL MASTECTOMY WITH NEEDLE LOCALIZATION  08/12/2012   Procedure: PARTIAL MASTECTOMY WITH NEEDLE LOCALIZATION;  Surgeon: Joyice Faster. Cornett, MD;  Location: [place];  Service: General;  Laterality: Right;  . REDUCTION MAMMAPLASTY Bilateral 1995  . THYROIDECTOMY Left 07/07/2014   Procedure: LEFT HEMI-THYROIDECTOMY;  Surgeon: Ascencion Dike, MD;  Location: [place];  Service: ENT;  Laterality: Left;  . THYROIDECTOMY Right 07/08/2014   Procedure: Completion THYROIDECTOMY;  Surgeon: Ascencion Dike, MD;  Location: [place];  Service: ENT;  Laterality: Right;  . THYROIDECTOMY, PARTIAL Left 07/07/2014   hemi     Significant Hospital Events   Admitted via the ED 11/19 complaining of shortness of breath. No beds available at [facility], hospitalist service to admit at [facility]. 11/20 PCCM asked to admit to ICU for increasing oxygen requirements.  Consults:  PCCM  Procedures:  None  Significant Diagnostic Tests:  11/19-chest x-ray:  Bilateral patchy interstitial alveolar infiltrates consistent with viral pneumonia (personally reviewed)  Micro Data:   SARS-CoV-2, NAA Not Detected DetectedAbnormal     Antimicrobials:  Decadron 6 mg daily 11/19 Remdesivir 400 mg daily 11/20  Interim history/subjective:  Severe hypoxemia overnight, now on 100% NRB and HFNC  Objective   Blood pressure (!) 155/105, pulse (!) 112, temperature 98.5 F (36.9 C), temperature source Axillary, resp. rate (!) 42, height 5\' 5"  (1.651 m), weight 100.5 kg, SpO2 (!) 88 %.    FiO2 (%):  [100 %] 100 %    Intake/Output Summary (Last 24 hours) at 06/06/2019 0925 Last data filed at 06/06/2019 0700 Gross per 24 hour  Intake 1835.93 ml  Output 750 ml  Net 1085.93 ml   Filed Weights   06/05/19 1500 06/06/19 0500  Weight: 99.8 kg 100.5 kg    Examination: General: Obese woman in no apparent distress. HENT: Mucous membranes are moist.  Sclera are anicteric. Lungs: No accessory muscle use.  Normal vesicular breath sounds throughout with crackles at both bases. Cardiovascular: JVP is not elevated there is no peripheral edema.  First and second heart sounds are unremarkable with no murmurs or gallops.  Extremities are warm and well-perfused. Abdomen: Abdomen is soft and nontender.  Bowel sounds are active. Extremities: No evidence of active joint disease. Neuro: Sensorium is clear, no focal neurological deficits. GU: Patient is voiding spontaneously.  Resolved Hospital Problem list   None  Assessment & Plan:  Critically ill due to hypoxic respiratory failure requiring titration of high heated flow oxygen. SIRS syndrome secondary to Covid 19. COVID-19 related diarrhea Acute kidney injury Type 2 diabetes at risk of worsening on steroids Hypertension at home. Migraine headaches  Start BiPAP, if fails BiPAP will have to intubate Titrate O2 for sat of 85-88% Lasix 40 mg IV q6 x3 doses Zaroxolyn 10 mg PO x1 K-dur 40 x2 doses BMET in AM CBC in AM Rocephin Zithromax Remdesivir Decadron 6 mg IV QD Levemir Continue trazodone and vilazodone ASA  Labs   CBC: Recent Labs  Lab 06/04/19 1856 06/04/19 2057 06/05/19 0426 06/05/19 0517 06/05/19 0700 06/06/19 0606  WBC 11.8*  --   --   --  11.2* 15.8*  NEUTROABS  --   --   --   --  9.3* 12.7*  HGB 12.0 11.9* 12.9 13.3 12.1 11.8*  HCT 38.4 35.0* 38.0 39.0 38.7 38.5  MCV 72.2*  --   --   --  71.5* 72.0*  PLT 314  --   --   --  212 [id number]    Basic Metabolic Panel: Recent Labs  Lab 06/04/19 1918 06/04/19 2057 06/05/19 0056 06/05/19  0426 06/05/19 0517 06/05/19 0700 06/06/19 0606  NA 137 132*  --  134* 134* 134* 140  K 4.5 3.8  --  4.3 4.2 4.5 4.3  CL 97*  --   --   --   --  94* 98  CO2 23  --   --   --   --  22 26  GLUCOSE 377*  --   --   --   --  317* 118*  BUN 31*  --   --   --   --  25* 18  CREATININE 1.73*  --   --   --   --  1.24* 0.85  CALCIUM 8.4*  --   --   --   --  8.0* 8.1*  MG  --   --  2.6*  --   --  2.4 2.0  PHOS  --   --   --   --   --  3.2  --    GFR: Estimated Creatinine Clearance: 83.7 mL/min (by C-G formula based on SCr of 0.85 mg/dL). Recent Labs  Lab 06/04/19 1856 06/04/19 1918 06/05/19 0055 06/05/19 0700 06/06/19 0606  PROCALCITON  --  1.11  --   --   --   WBC 11.8*  --   --  11.2* 15.8*  LATICACIDVEN  --   --  1.5  --   --     Liver Function Tests: Recent Labs  Lab 06/04/19 1918 06/05/19 0700 06/06/19 0606  AST 30 30 36  ALT 32 29 25  ALKPHOS 57 67 117  BILITOT 0.4 0.6 0.6  PROT 7.6 7.1 7.1  ALBUMIN 2.9* 2.7* 2.6*   No results for input(s): LIPASE, AMYLASE in the last 168 hours. No results for input(s): AMMONIA in the last 168 hours.  ABG    Component Value Date/Time   PHART 7.452 (H) 06/05/2019 0517   PCO2ART 34.3 06/05/2019 0517   PO2ART 22.0 (LL) 06/05/2019 0517   HCO3 23.8 06/05/2019 0517   TCO2 25 06/05/2019 0517   ACIDBASEDEF 3.9 (H) 11/27/2016 0411   O2SAT 38.0 06/05/2019 0517     Coagulation Profile: No results for input(s): INR, PROTIME in the last 168 hours.  Cardiac Enzymes: No results for input(s): CKTOTAL, CKMB, CKMBINDEX, TROPONINI in the last 168 hours.  HbA1C: Hgb A1c MFr Bld  Date/Time Value Ref Range Status  05/18/2019 05:06 PM 8.7 (H) 4.8 - 5.6 % Final    Comment:             Prediabetes: 5.7 - 6.4          Diabetes: >6.4          Glycemic control for adults with diabetes: <7.0   02/10/2019 12:33 PM 7.7 (H) 4.8 - 5.6 % Final    Comment:             Prediabetes: 5.7 - 6.4          Diabetes: >6.4          Glycemic control for  adults with diabetes: <7.0     CBG: Recent Labs  Lab 06/05/19 1237 06/05/19 1950 06/06/19 0006 06/06/19 0351 06/06/19 0801  GLUCAP 168* 200* 168* 133* 85   The patient is critically ill with multiple organ systems failure and requires high complexity decision making for assessment and support, frequent evaluation and titration of therapies, application of advanced monitoring technologies and extensive interpretation of multiple databases.   Critical Care Time devoted to patient care services described in this note is  32  Minutes. This time reflects time of care of this signee Dr Jennet Maduro. This critical care time does not reflect procedure time, or teaching time or supervisory time of PA/NP/Med student/Med Resident etc but could involve care discussion time.  Rush Farmer, M.D. [facility] Pulmonary/Critical Care Medicine.

## 2019-06-06 NOTE — Progress Notes (Signed)
[place] Progress Note Patient Name: Brittany Rubio DOB: 10-18-1960 MRN: [id number]   Date of Service  06/06/2019  HPI/Events of Note  Anxiety - Request for home Valium.   eICU Interventions  Will order: 1. Xanax 0.25 mg PO X 1.      Intervention Category Major Interventions: Delirium, psychosis, severe agitation - evaluation and management  Sommer,Steven Eugene 06/06/2019, 3:32 AM

## 2019-06-06 NOTE — Progress Notes (Signed)
ANTICOAGULATION CONSULT NOTE  Pharmacy Consult:  Heparin Indication: r/o VTE  Heparin Dosing Weight: 79.8 kg  Labs: Recent Labs    06/04/19 1856 06/04/19 1918  06/05/19 0517 06/05/19 0700 06/06/19 0606 06/06/19 1422  HGB 12.0  --    < > 13.3 12.1 11.8*  --   HCT 38.4  --    < > 39.0 38.7 38.5  --   PLT 314  --   --   --  212 262  --   HEPARINUNFRC  --   --   --   --   --  0.45 0.29*  CREATININE  --  1.73*  --   --  1.24* 0.85  --    < > = values in this interval not displayed.    Assessment: 71 YOF presenting with SOB, COVID-19+ from 05/27/19.  Pharmacy consulted to dose heparin drip for r/o VTE with d-dimer increased to >20. LE dopplers pending.   Confirmatory heparin level is slightly sub-therapeutic; no bleeding reported.  Goal of Therapy:  Heparin level 0.3-0.7 units/ml Monitor platelets by anticoagulation protocol: Yes   Plan:  Increase heparin gtt to 1500 units/hr F/U AM labs  Bina Veenstra D. Mina Marble, PharmD, BCPS, [place] 06/06/2019, 3:48 PM

## 2019-06-06 NOTE — Progress Notes (Signed)
ANTICOAGULATION CONSULT NOTE Pharmacy Consult for heparin Indication: r/o VTE  Heparin Dosing Weight: 79.8 kg  Labs: Recent Labs    06/04/19 1856 06/04/19 1918  06/05/19 0517 06/05/19 0700 06/06/19 0606  HGB 12.0  --    < > 13.3 12.1 11.8*  HCT 38.4  --    < > 39.0 38.7 38.5  PLT 314  --   --   --  212 262  HEPARINUNFRC  --   --   --   --   --  0.45  CREATININE  --  1.73*  --   --  1.24* 0.85   < > = values in this interval not displayed.    Assessment: 28 yof presenting with SOB, COVID-19+and d-dimer >20, possible PE, for heparin   Goal of Therapy:  Heparin level 0.3-0.7 units/ml Monitor platelets by anticoagulation protocol: Yes   Plan:  Continue Heparin at current rate  Check heparin level in 6 hours to verify  Phillis Knack, PharmD, BCPS  06/06/2019 7:39 AM

## 2019-06-07 ENCOUNTER — Inpatient Hospital Stay (HOSPITAL_COMMUNITY): Payer: BC Managed Care – PPO

## 2019-06-07 LAB — BASIC METABOLIC PANEL
Anion gap: 14 (ref 5–15)
BUN: 24 mg/dL — ABNORMAL HIGH (ref 6–20)
CO2: 27 mmol/L (ref 22–32)
Calcium: 8.2 mg/dL — ABNORMAL LOW (ref 8.9–10.3)
Chloride: 100 mmol/L (ref 98–111)
Creatinine, Ser: 0.8 mg/dL (ref 0.44–1.00)
GFR calc Af Amer: >60 mL/min (ref >60)
GFR calc non Af Amer: >60 mL/min (ref >60)
Glucose, Bld: 95 mg/dL (ref 70–99)
Potassium: 4.2 mmol/L (ref 3.5–5.1)
Sodium: 141 mmol/L (ref 135–145)

## 2019-06-07 LAB — CBC
HCT: 39.8 % (ref 36.0–46.0)
Hemoglobin: 12.2 g/dL (ref 12.0–15.0)
MCH: 22.2 pg — ABNORMAL LOW (ref 26.0–34.0)
MCHC: 30.7 g/dL (ref 30.0–36.0)
MCV: 72.5 fL — ABNORMAL LOW (ref 80.0–100.0)
Platelets: 289 10*3/uL (ref 150–400)
RBC: 5.49 MIL/uL — ABNORMAL HIGH (ref 3.87–5.11)
RDW: 17.3 % — ABNORMAL HIGH (ref 11.5–15.5)
WBC: 14.3 10*3/uL — ABNORMAL HIGH (ref 4.0–10.5)
nRBC: 2.1 % — ABNORMAL HIGH (ref 0.0–0.2)

## 2019-06-07 LAB — GLUCOSE, CAPILLARY
Glucose-Capillary: 108 mg/dL — ABNORMAL HIGH (ref 70–99)
Glucose-Capillary: 126 mg/dL — ABNORMAL HIGH (ref 70–99)
Glucose-Capillary: 140 mg/dL — ABNORMAL HIGH (ref 70–99)
Glucose-Capillary: 157 mg/dL — ABNORMAL HIGH (ref 70–99)
Glucose-Capillary: 89 mg/dL (ref 70–99)
Glucose-Capillary: 94 mg/dL (ref 70–99)

## 2019-06-07 LAB — POCT I-STAT 7, (LYTES, BLD GAS, ICA,H+H)
Acid-Base Excess: 5 mmol/L — ABNORMAL HIGH (ref 0.0–2.0)
Bicarbonate: 28.7 mmol/L — ABNORMAL HIGH (ref 20.0–28.0)
Calcium, Ion: 1.05 mmol/L — ABNORMAL LOW (ref 1.15–1.40)
HCT: 38 % (ref 36.0–46.0)
Hemoglobin: 12.9 g/dL (ref 12.0–15.0)
O2 Saturation: 94 %
Patient temperature: 98.5
Potassium: 3.8 mmol/L (ref 3.5–5.1)
Sodium: 139 mmol/L (ref 135–145)
TCO2: 30 mmol/L (ref 22–32)
pCO2 arterial: 38.5 mmHg (ref 32.0–48.0)
pH, Arterial: 7.481 — ABNORMAL HIGH (ref 7.350–7.450)
pO2, Arterial: 67 mmHg — ABNORMAL LOW (ref 83.0–108.0)

## 2019-06-07 LAB — HEPARIN LEVEL (UNFRACTIONATED): Heparin Unfractionated: 0.39 IU/mL (ref 0.30–0.70)

## 2019-06-07 LAB — LEGIONELLA PNEUMOPHILA SEROGP 1 UR AG: L. pneumophila Serogp 1 Ur Ag: NEGATIVE

## 2019-06-07 LAB — PHOSPHORUS: Phosphorus: 2.2 mg/dL — ABNORMAL LOW (ref 2.5–4.6)

## 2019-06-07 LAB — MAGNESIUM: Magnesium: 1.9 mg/dL (ref 1.7–2.4)

## 2019-06-07 MED ORDER — FUROSEMIDE 10 MG/ML IJ SOLN
40.0000 mg | Freq: Four times a day (QID) | INTRAMUSCULAR | Status: AC
  Administered 2019-06-07 (×3): 40 mg via INTRAVENOUS
  Filled 2019-06-07 (×3): qty 4

## 2019-06-07 MED ORDER — POTASSIUM CHLORIDE CRYS ER 20 MEQ PO TBCR
40.0000 meq | EXTENDED_RELEASE_TABLET | Freq: Once | ORAL | Status: AC
  Administered 2019-06-07: 40 meq via ORAL
  Filled 2019-06-07: qty 2

## 2019-06-07 MED ORDER — SODIUM CHLORIDE 0.9% FLUSH: 10.0000 mL | INTRAVENOUS | Status: DC | PRN

## 2019-06-07 MED ORDER — SODIUM CHLORIDE 0.9% FLUSH
10.0000 mL | Freq: Two times a day (BID) | INTRAVENOUS | Status: DC
  Administered 2019-06-08 – 2019-06-11 (×8): 10 mL
  Administered 2019-06-11: 20 mL
  Administered 2019-06-12 – 2019-06-16 (×9): 10 mL

## 2019-06-07 MED ORDER — FAMOTIDINE 40 MG/5ML PO SUSR
20.0000 mg | Freq: Two times a day (BID) | ORAL | Status: DC
  Administered 2019-06-07 – 2019-06-08 (×4): 20 mg via ORAL
  Filled 2019-06-07 (×4): qty 2.5

## 2019-06-07 MED ORDER — SODIUM CHLORIDE 0.9 % IV SOLN
INTRAVENOUS | Status: DC | PRN
  Administered 2019-06-09: 250 mL via INTRAVENOUS

## 2019-06-07 MED ORDER — AZITHROMYCIN 500 MG PO TABS
500.0000 mg | ORAL_TABLET | Freq: Every day | ORAL | Status: DC
  Administered 2019-06-07: 500 mg via ORAL
  Filled 2019-06-07 (×2): qty 1

## 2019-06-07 MED ORDER — METOLAZONE 5 MG PO TABS
10.0000 mg | ORAL_TABLET | Freq: Once | ORAL | Status: AC
  Administered 2019-06-07: 10 mg via ORAL
  Filled 2019-06-07: qty 1
  Filled 2019-06-07: qty 2

## 2019-06-07 NOTE — Progress Notes (Signed)
Patient L forearm IV site began bleeding. IV lost. Held pressure for 20 minutes and stopped heparin drip from 2045-2115. Pressure dressing applied. VSS. Will continue to monitor.

## 2019-06-07 NOTE — Progress Notes (Signed)
ANTICOAGULATION CONSULT NOTE  Pharmacy Consult:  Heparin Indication: r/o VTE  Heparin Dosing Weight: 79.8 kg  Labs: Recent Labs    06/05/19 0700 06/06/19 0606 06/06/19 1422 06/07/19 0318 06/07/19 0333  HGB 12.1 11.8*  --  12.9 12.2  HCT 38.7 38.5  --  38.0 39.8  PLT 212 262  --   --  289  HEPARINUNFRC  --  0.45 0.29*  --  0.39  CREATININE 1.24* 0.85  --   --  0.80    Assessment: 24 YOF presenting with SOB, COVID-19+ from 05/27/19.  Pharmacy consulted to dose heparin drip for r/o VTE with d-dimer increased to >20. LE dopplers pending.   Heparin level therapeutic at 0.39 this AM  Goal of Therapy:  Heparin level 0.3-0.7 units/ml Monitor platelets by anticoagulation protocol: Yes   Plan:  Continue heparin gtt to 1500 units/hr Daily HL and CBC  Nicoletta Dress, PharmD PGY2 Infectious Disease Pharmacy Resident  06/07/2019, 8:18 AM

## 2019-06-07 NOTE — Progress Notes (Signed)
Pharmacy IV to PO Conversion  This patient is receiving azithromycin by the intravenous route. Based on criteria approved by the Pharmacy and Therapeutics Committee, and the Infectious Disease Division, the antibiotic(s) is / are being converted to equivalent oral dose form(s). These criteria include:    . Patient being treated for a respiratory tract infection, urinary tract infection, cellulitis, or     Clostridium Difficile Associated Diarrhea   .   The patient is not neutropenic and does not exhibit a GI malabsorption state   .   The patient is eating (either orally or per tube) and/or has been taking other orally  administered medications for at least 24 hours.   .  The patient is improving clinically (physician assessment and a 24-hour Tmax of   ? 100.48F).   If you have questions about this conversion, please contact the pharmacy department. Thank you.  Nicoletta Dress, PharmD PGY2 Infectious Disease Pharmacy Resident

## 2019-06-07 NOTE — Progress Notes (Signed)
NAME:  Brittany Rubio, MRN:  [id number], DOB:  10-Oct-1960, LOS: 3 ADMISSION DATE:  06/04/2019, CONSULTATION DATE: 06/05/2019 REFERRING MD: Charisse March hospitalist, CHIEF COMPLAINT: Hypoxia  Brief History   58 year old woman with 3-day history of worsening dyspnea.  Found to have COVID-19 pneumonia.  Requiring ICU admission for high oxygen requirements.  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  She believes that she contracted from her husband who was sick himself.  She complains generalized malaise severe headache and diarrhea.  No nausea and vomiting, no myalgias.  She reports shortness of breath only on exertion.  Cough is likewise only exertional.  Past medical history notable for type 2 diabetes, obstructive sleep apnea, frequent migraines with aura, and remote breast and thyroid cancers.  Past Medical History   Past Medical History:  Diagnosis Date  . ADD (attention deficit disorder)   . Anemia   . Anxiety   . Arthritis    "left shoulder" (07/07/2014)  . Back pain   . Breast cancer ([place])    "left"  . Depression   . Fatty liver   . Food allergy    shellfish  . GERD (gastroesophageal reflux disease)    "takes over the counters as needed"  . Hernia, umbilical   . High cholesterol   . Hypertension 11/26/2011   sees Dr. Bryon Lions  . Hypothyroidism   . Joint pain   . Migraines    "maybe once q 3 months" (07/07/2014)  . Multinodular thyroid 2015  . OSA on CPAP   . Personal history of radiation therapy   . Pneumonia    May 2018  . Thyroid cancer ([place])    2015  . Type II diabetes mellitus ([place])   . Vitamin D deficiency    Past Surgical History:  Procedure Laterality Date  . ABDOMINAL HYSTERECTOMY  ? 1997  . BREAST EXCISIONAL BIOPSY Right 2014  . BREAST LUMPECTOMY Left 2009  . BREAST LUMPECTOMY Right 07/2012   "not a  mastectomy"  . BREAST REDUCTION SURGERY Bilateral   . DIAPHRAGM SURGERY  1986   "trauma"  . INCISIONAL HERNIA REPAIR N/A 06/05/2018   Procedure: LAPAROSCOPIC INCISIONAL HERNIA ERAS PATHWAY;  Surgeon: Erroll Luna, MD;  Location: [place];  Service: General;  Laterality: N/A;  . INSERTION OF MESH N/A 06/05/2018   Procedure: INSERTION OF MESH;  Surgeon: Erroll Luna, MD;  Location: [place];  Service: General;  Laterality: N/A;  . PARTIAL MASTECTOMY WITH NEEDLE LOCALIZATION  08/12/2012   Procedure: PARTIAL MASTECTOMY WITH NEEDLE LOCALIZATION;  Surgeon: Joyice Faster. Cornett, MD;  Location: [place];  Service: General;  Laterality: Right;  . REDUCTION MAMMAPLASTY Bilateral 1995  . THYROIDECTOMY Left 07/07/2014   Procedure: LEFT HEMI-THYROIDECTOMY;  Surgeon: Ascencion Dike, MD;  Location: [place];  Service: ENT;  Laterality: Left;  . THYROIDECTOMY Right 07/08/2014   Procedure: Completion THYROIDECTOMY;  Surgeon: Ascencion Dike, MD;  Location: [place];  Service: ENT;  Laterality: Right;  . THYROIDECTOMY, PARTIAL Left 07/07/2014   hemi     Significant Hospital Events   Admitted via the ED 11/19 complaining of shortness of breath. No beds available at [facility], hospitalist service to admit at [facility]. 11/20 PCCM asked to admit to ICU for increasing oxygen requirements.  Consults:  PCCM  Procedures:  None  Significant Diagnostic Tests:  11/19-chest x-ray:  Bilateral patchy interstitial alveolar infiltrates consistent with viral pneumonia (personally reviewed)  Micro Data:   SARS-CoV-2, NAA Not Detected DetectedAbnormal     Antimicrobials:  Decadron 6 mg daily 11/19 Remdesivir 400 mg daily 11/20  Interim history/subjective:  Required BiPAP overnight Appears more comfortable this AM on 100% HFNC  Objective   Blood pressure (!) 129/104, pulse 89, temperature 98.5 F (36.9 C), temperature source Oral, resp. rate (!) 26, height 5\' 5"  (1.651 m), weight 98 kg, SpO2 94 %.    Vent Mode: BIPAP FiO2 (%):   [80 %-100 %] 100 % Set Rate:  [14 bmp] 14 bmp PEEP:  [8 cmH20] 8 cmH20 Pressure Support:  [8 cmH20] 8 cmH20   Intake/Output Summary (Last 24 hours) at 06/07/2019 0858 Last data filed at 06/07/2019 0600 Gross per 24 hour  Intake 1066.61 ml  Output 1750 ml  Net -683.39 ml   Filed Weights   06/05/19 1500 06/06/19 0500 06/07/19 0457  Weight: 99.8 kg 100.5 kg 98 kg   Examination: General: Obese female, was on BiPAP and transitioned to HFNC, appears in mild respiratory distress HENT: [place]/AT, PERRL, EOM-I and MMM, HFNC in place Lungs: Mild accessory muscle use and tachypneic but stable with coarse BS diffusely Cardiovascular: RRR, Nl S1/S2 and -M/R/G Abdomen: Soft, NT, ND and +BS Extremities: trace edema and -tenderness Neuro: Sensorium is clear, no focal neurological deficits. Skin: Intact  I reviewed CXR myself, infiltrate noted  Resolved Hospital Problem list   None  Assessment & Plan:  Critically ill due to hypoxic respiratory failure requiring titration of high heated flow oxygen. SIRS syndrome secondary to Covid 19. COVID-19 related diarrhea Acute kidney injury Type 2 diabetes at risk of worsening on steroids Hypertension at home. Migraine headaches  BiPAP to PRN and QHS, seems to do better when rested on BiPAP overnight Titrate O2 for sat of 85-88% Lasix 40 mg IV q6 x3 doses Zaroxolyn 10 mg PO x1 K-dur x1 dose BMET in AM Check Mg and Phos CBC in AM Rocephin Zithromax Remdesivir Decadron 6 mg IV QD Levemir Continue trazodone and vilazodone ASA  Labs   CBC: Recent Labs  Lab 06/04/19 1856  06/05/19 0517 06/05/19 0700 06/06/19 0606 06/07/19 0318 06/07/19 0333  WBC 11.8*  --   --  11.2* 15.8*  --  14.3*  NEUTROABS  --   --   --  9.3* 12.7*  --   --   HGB 12.0   < > 13.3 12.1 11.8* 12.9 12.2  HCT 38.4   < > 39.0 38.7 38.5 38.0 39.8  MCV 72.2*  --   --  71.5* 72.0*  --  72.5*  PLT 314  --   --  212 262  --  289   < > = values in this interval not  displayed.    Basic Metabolic Panel: Recent Labs  Lab 06/04/19 1918  06/05/19 0056  06/05/19 0517 06/05/19 0700 06/06/19 0606 06/07/19 0318 06/07/19 0333  NA 137   < >  --    < > 134* 134* 140 139 141  K 4.5   < >  --    < > 4.2 4.5 4.3 3.8 4.2  CL 97*  --   --   --   --  94* 98  --  100  CO2 23  --   --   --   --  22 26  --  27  GLUCOSE 377*  --   --   --   --  317* 118*  --  95  BUN 31*  --   --   --   --  25* 18  --  24*  CREATININE 1.73*  --   --   --   --  1.24* 0.85  --  0.80  CALCIUM 8.4*  --   --   --   --  8.0* 8.1*  --  8.2*  MG  --   --  2.6*  --   --  2.4 2.0  --  1.9  PHOS  --   --   --   --   --  3.2  --   --  2.2*   < > = values in this interval not displayed.   GFR: Estimated Creatinine Clearance: 87.7 mL/min (by C-G formula based on SCr of 0.8 mg/dL). Recent Labs  Lab 06/04/19 1856 06/04/19 1918 06/05/19 0055 06/05/19 0700 06/06/19 0606 06/07/19 0333  PROCALCITON  --  1.11  --   --   --   --   WBC 11.8*  --   --  11.2* 15.8* 14.3*  LATICACIDVEN  --   --  1.5  --   --   --     Liver Function Tests: Recent Labs  Lab 06/04/19 1918 06/05/19 0700 06/06/19 0606  AST 30 30 36  ALT 32 29 25  ALKPHOS 57 67 117  BILITOT 0.4 0.6 0.6  PROT 7.6 7.1 7.1  ALBUMIN 2.9* 2.7* 2.6*   No results for input(s): LIPASE, AMYLASE in the last 168 hours. No results for input(s): AMMONIA in the last 168 hours.  ABG    Component Value Date/Time   PHART 7.481 (H) 06/07/2019 0318   PCO2ART 38.5 06/07/2019 0318   PO2ART 67.0 (L) 06/07/2019 0318   HCO3 28.7 (H) 06/07/2019 0318   TCO2 30 06/07/2019 0318   ACIDBASEDEF 3.9 (H) 11/27/2016 0411   O2SAT 94.0 06/07/2019 0318     Coagulation Profile: No results for input(s): INR, PROTIME in the last 168 hours.  Cardiac Enzymes: No results for input(s): CKTOTAL, CKMB, CKMBINDEX, TROPONINI in the last 168 hours.  HbA1C: Hgb A1c MFr Bld  Date/Time Value Ref Range Status  05/18/2019 05:06 PM 8.7 (H) 4.8 - 5.6 % Final     Comment:             Prediabetes: 5.7 - 6.4          Diabetes: >6.4          Glycemic control for adults with diabetes: <7.0   02/10/2019 12:33 PM 7.7 (H) 4.8 - 5.6 % Final    Comment:             Prediabetes: 5.7 - 6.4          Diabetes: >6.4          Glycemic control for adults with diabetes: <7.0     CBG: Recent Labs  Lab 06/06/19 1519 06/06/19 2003 06/07/19 0013 06/07/19 0503 06/07/19 0753  GLUCAP 173* 167* 140* 94 89   The patient is critically ill with multiple organ systems failure and requires high complexity decision making for assessment and support, frequent evaluation and titration of therapies, application of advanced monitoring technologies and extensive interpretation of multiple databases.   Critical Care Time devoted to patient care services described in this note is  34  Minutes. This time reflects time of care of this signee Dr Jennet Maduro. This critical care time does not reflect procedure time, or teaching  time or supervisory time of PA/NP/Med student/Med Resident etc but could involve care discussion time.  Rush Farmer, M.D. [facility] Pulmonary/Critical Care Medicine.

## 2019-06-08 LAB — GLUCOSE, CAPILLARY
Glucose-Capillary: 139 mg/dL — ABNORMAL HIGH (ref 70–99)
Glucose-Capillary: 166 mg/dL — ABNORMAL HIGH (ref 70–99)
Glucose-Capillary: 228 mg/dL — ABNORMAL HIGH (ref 70–99)
Glucose-Capillary: 269 mg/dL — ABNORMAL HIGH (ref 70–99)
Glucose-Capillary: 78 mg/dL (ref 70–99)
Glucose-Capillary: 87 mg/dL (ref 70–99)
Glucose-Capillary: 94 mg/dL (ref 70–99)

## 2019-06-08 LAB — BASIC METABOLIC PANEL
Anion gap: 14 (ref 5–15)
BUN: 33 mg/dL — ABNORMAL HIGH (ref 6–20)
CO2: 27 mmol/L (ref 22–32)
Calcium: 8.3 mg/dL — ABNORMAL LOW (ref 8.9–10.3)
Chloride: 95 mmol/L — ABNORMAL LOW (ref 98–111)
Creatinine, Ser: 1.38 mg/dL — ABNORMAL HIGH (ref 0.44–1.00)
GFR calc Af Amer: 48 mL/min — ABNORMAL LOW (ref >60)
GFR calc non Af Amer: 42 mL/min — ABNORMAL LOW (ref >60)
Glucose, Bld: 88 mg/dL (ref 70–99)
Potassium: 3.4 mmol/L — ABNORMAL LOW (ref 3.5–5.1)
Sodium: 136 mmol/L (ref 135–145)

## 2019-06-08 LAB — CBC
HCT: 42.1 % (ref 36.0–46.0)
Hemoglobin: 13 g/dL (ref 12.0–15.0)
MCH: 22.1 pg — ABNORMAL LOW (ref 26.0–34.0)
MCHC: 30.9 g/dL (ref 30.0–36.0)
MCV: 71.7 fL — ABNORMAL LOW (ref 80.0–100.0)
Platelets: 358 10*3/uL (ref 150–400)
RBC: 5.87 MIL/uL — ABNORMAL HIGH (ref 3.87–5.11)
RDW: 17.2 % — ABNORMAL HIGH (ref 11.5–15.5)
WBC: 12.8 10*3/uL — ABNORMAL HIGH (ref 4.0–10.5)
nRBC: 1.5 % — ABNORMAL HIGH (ref 0.0–0.2)

## 2019-06-08 LAB — HEPARIN LEVEL (UNFRACTIONATED)
Heparin Unfractionated: 1.04 IU/mL — ABNORMAL HIGH (ref 0.30–0.70)
Heparin Unfractionated: 0.24 IU/mL — ABNORMAL LOW (ref 0.30–0.70)
Heparin Unfractionated: 0.67 IU/mL (ref 0.30–0.70)

## 2019-06-08 LAB — MAGNESIUM: Magnesium: 1.9 mg/dL (ref 1.7–2.4)

## 2019-06-08 LAB — PHOSPHORUS: Phosphorus: 3.5 mg/dL (ref 2.5–4.6)

## 2019-06-08 MED ORDER — FAMOTIDINE 20 MG PO TABS
20.0000 mg | ORAL_TABLET | Freq: Two times a day (BID) | ORAL | Status: DC
  Administered 2019-06-09 (×2): 20 mg via ORAL
  Filled 2019-06-08 (×2): qty 1

## 2019-06-08 MED ORDER — BUTORPHANOL TARTRATE 10 MG/ML NA SOLN
1.0000 | Freq: Three times a day (TID) | NASAL | Status: DC | PRN
  Administered 2019-06-08 – 2019-06-15 (×5): 1 via NASAL
  Filled 2019-06-08 (×3): qty 2.5

## 2019-06-08 MED ORDER — VITAMIN D 25 MCG (1000 UNIT) PO TABS
1000.0000 [IU] | ORAL_TABLET | Freq: Every day | ORAL | Status: DC
  Administered 2019-06-08 – 2019-06-16 (×9): 1000 [IU] via ORAL
  Filled 2019-06-08 (×10): qty 1

## 2019-06-08 MED ORDER — ZINC SULFATE 220 (50 ZN) MG PO CAPS
220.0000 mg | ORAL_CAPSULE | Freq: Every day | ORAL | Status: DC
  Administered 2019-06-08 – 2019-06-16 (×9): 220 mg via ORAL
  Filled 2019-06-08 (×9): qty 1

## 2019-06-08 MED ORDER — VITAMIN C 500 MG PO TABS
250.0000 mg | ORAL_TABLET | Freq: Every day | ORAL | Status: DC
  Administered 2019-06-08 – 2019-06-16 (×9): 250 mg via ORAL
  Filled 2019-06-08 (×9): qty 1

## 2019-06-08 NOTE — Progress Notes (Signed)
NAME:  Brittany Rubio, MRN:  [id number], DOB:  Oct 19, 1960, LOS: 4 ADMISSION DATE:  06/04/2019, CONSULTATION DATE: 06/05/2019 REFERRING MD: Charisse March hospitalist, CHIEF COMPLAINT: Hypoxia  Brief History   58 yo female developed a non productive cough, subjective fever, chills, myalgias 10 days prior to admission.  She then developed progressive dyspnea after testing positive for COVID 19 on 05/27/19.  Her husband was admitted to [facility] around time she developed illness.  Works as Microbiologist at [employer].  Developed progressive hypoxia from COVID 19 pneumonia and transferred to ICU.  Past Medical History  DM, OSA,Thyroid cancer, Hypothyroidism, HTN, Vit D deficiency, Migraine HA, HLD, GERD, Fatty liver, Depression, Breast cancer, OA, Anxiety, ADD  Significant Hospital Events   11/19 Admit 11/20 transfer to ICU  Consults:    Procedures:    Significant Diagnostic Tests:  Echo 11/20 >> EF 60 to 65%, mod TR, mod elevation in PASP Doppler legs b/l 11/21 >> acute DVT Rt posterior tibial/soleal/gastrocneumius veins, and acute DVT Lt posteriod tibial/peroneal veins  Micro Data:  COVID 11/11 >> detected Blood 11/20 >>   COVID Therapy:  Decadron 6 mg daily 11/19 Remdesivir 400 mg daily 11/20  Antimicrobials:  Rocephin 11/20 >> 11/23 Zithromax 11/20 >> 11/23  Interim history/subjective:  Remains on non rebreather.  Denies chest pain.  Still has some diarrhea.  Tolerating diet.  Objective   Blood pressure (!) 144/120, pulse (!) 103, temperature 99.4 F (37.4 C), temperature source Oral, resp. rate 19, height 5\' 5"  (1.651 m), weight 97.6 kg, SpO2 96 %.    Vent Mode: BIPAP FiO2 (%):  [80 %] 80 % Set Rate:  [14 bmp] 14 bmp PEEP:  [8 cmH20] 8 cmH20 Pressure Support:  [8 cmH20] 8 cmH20   Intake/Output Summary (Last 24 hours) at 06/08/2019 [id number] Last data filed at 06/08/2019 0600 Gross per 24 hour  Intake 1390.76 ml  Output 1700 ml  Net -309.24 ml   Filed  Weights   06/06/19 0500 06/07/19 0457 06/08/19 0312  Weight: 100.5 kg 98 kg 97.6 kg   Examination:  General - alert Eyes - pupils reactive ENT - no sinus tenderness, no stridor Cardiac - regular rate/rhythm, no murmur Chest - scattered rhonchi Abdomen - soft, non tender, + bowel sounds Extremities - no cyanosis, clubbing, or edema Skin - no rashes Neuro - normal strength, moves extremities, follows commands Lymphatics - no lymphadenopathy Psych - normal mood and behavior   Resolved Hospital Problem list     Assessment & Plan:   Acute hypoxic respiratory failure from COVID 19 pneumonia. Hx of OSA. - day 5/10 of decadron - day 4/5 of remdesivir - goal SpO2 88 to 95% - bronchial hygiene - mobilize as able - prone positioning as able - add zinc, vit C, ASA - doubt bacterial infection >> d/c rocephin and zithromax - f/u CRP, D dimer  Hx of HTN. - hold diltiazem, lisinopril, HCTZ  B/l lower extremity DVTs. - heparin gtt per pharmacy - transition to oral anticoagulation soon  DM type II with steroid induced hyperglycemia. Hx of hypothyroidism. Vit D deficiency. - continue synthroid - SSI - vit D3 - hold outpt farxiga, janumet  Migraine headaches. Hx of depression, anxiety, ADD. - continue vilazodone, trazodone - hold outpt adderall, valium - resume stadol prn  Hx of GERD. - pepcid   Labs    CMP Latest Ref Rng & Units 06/08/2019 06/07/2019 06/07/2019  Glucose 70 - 99 mg/dL 88 95 -  BUN 6 -  20 mg/dL 33(H) 24(H) -  Creatinine 0.44 - 1.00 mg/dL 1.38(H) 0.80 -  Sodium 135 - 145 mmol/L 136 141 139  Potassium 3.5 - 5.1 mmol/L 3.4(L) 4.2 3.8  Chloride 98 - 111 mmol/L 95(L) 100 -  CO2 22 - 32 mmol/L 27 27 -  Calcium 8.9 - 10.3 mg/dL 8.3(L) 8.2(L) -  Total Protein 6.5 - 8.1 g/dL - - -  Total Bilirubin 0.3 - 1.2 mg/dL - - -  Alkaline Phos 38 - 126 U/L - - -  AST 15 - 41 U/L - - -  ALT 0 - 44 U/L - - -    CBC Latest Ref Rng & Units 06/08/2019 06/07/2019  06/07/2019  WBC 4.0 - 10.5 K/uL 12.8(H) 14.3(H) -  Hemoglobin 12.0 - 15.0 g/dL 13.0 12.2 12.9  Hematocrit 36.0 - 46.0 % 42.1 39.8 38.0  Platelets 150 - 400 K/uL 358 289 -   ABG    Component Value Date/Time   PHART 7.481 (H) 06/07/2019 0318   PCO2ART 38.5 06/07/2019 0318   PO2ART 67.0 (L) 06/07/2019 0318   HCO3 28.7 (H) 06/07/2019 0318   TCO2 30 06/07/2019 0318   ACIDBASEDEF 3.9 (H) 11/27/2016 0411   O2SAT 94.0 06/07/2019 0318   CBG (last 3)  Recent Labs    06/08/19 0008 06/08/19 0403 06/08/19 0750  GLUCAP 94 11 Dwight, MD [facility] 06/08/2019, 9:03 AM

## 2019-06-08 NOTE — Progress Notes (Signed)
ANTICOAGULATION CONSULT NOTE  Pharmacy Consult:  Heparin Indication: bilateral lower extremity DVT  Heparin Dosing Weight: 79.8 kg  Labs: Recent Labs    06/06/19 0606  06/07/19 0318 06/07/19 0333 06/08/19 0518 06/08/19 0927 06/08/19 1814  HGB 11.8*  --  12.9 12.2 13.0  --   --   HCT 38.5  --  38.0 39.8 42.1  --   --   PLT 262  --   --  289 358  --   --   HEPARINUNFRC 0.45   < >  --  0.39 1.04* 0.24* 0.67  CREATININE 0.85  --   --  0.80 1.38*  --   --    < > = values in this interval not displayed.    Assessment: 27 YOF presenting with SOB, COVID-19+ from 05/27/19.  Pharmacy consulted to dose heparin drip for DVT, 11/21 dopplers show acute DVT of multiple veins within the right and left lower extremities   Heparin level therapeutic s/p rate increase  Goal of Therapy:  Heparin level 0.3-0.7 units/ml Monitor platelets by anticoagulation protocol: Yes   Plan:  Continue heparin gtt at 1700 units/hr  Confirm with AM labs Follow up to transition to oral anticoagulation (likely 11/24 per Dr. Halford Chessman)  Bertis Ruddy, PharmD Clinical Pharmacist Please check AMION for all [place] numbers 06/08/2019 6:49 PM

## 2019-06-08 NOTE — Progress Notes (Signed)
ANTICOAGULATION CONSULT NOTE  Pharmacy Consult:  Heparin Indication: bilateral lower extremity DVT  Heparin Dosing Weight: 79.8 kg  Labs: Recent Labs    06/06/19 0606 06/06/19 1422 06/07/19 0318 06/07/19 0333 06/08/19 0518  HGB 11.8*  --  12.9 12.2 13.0  HCT 38.5  --  38.0 39.8 42.1  PLT 262  --   --  289 358  HEPARINUNFRC 0.45 0.29*  --  0.39 1.04*  CREATININE 0.85  --   --  0.80 1.38*    Assessment: 72 YOF presenting with SOB, COVID-19+ from 05/27/19.  Pharmacy consulted to dose heparin drip for DVT, 11/21 dopplers show acute DVT of multiple veins within the right and left lower extremities   Heparin level this am was supratherapeutic at 1.38 on 1500 units/hour, she had previously been therapeutic at this rate, causing suspicion for an inaccurate lab draw. Per nursing, the patient's bleeding from overnight has stopped and no issues with infusion. A STAT heparin level was ordered.  STAT heparin level is sub-therapeutic at 0.24 on 1500 units/hour. H&H is stable, today at 13.0/42.1, plts are wnl at 358. Although sub-therapeutic, will withold doing a bolus due to the bleeding issues over night (as noted above, bleeding issues are now resolved).   Goal of Therapy:  Heparin level 0.3-0.7 units/ml Monitor platelets by anticoagulation protocol: Yes   Plan:  Increase heparin gtt to 1700 units/hr  Confirmatory HL at 1800 Daily HL and CBC Monitor for bleeding  Follow up to transition to oral anticoagulation (likely 11/24 per Dr. Halford Chessman)    Thank you,   Eddie Candle, PharmD PGY-1 Pharmacy Resident   Please check amion for clinical pharmacist contact number

## 2019-06-09 DIAGNOSIS — J9601 Acute respiratory failure with hypoxia: Secondary | ICD-10-CM

## 2019-06-09 LAB — BASIC METABOLIC PANEL
Anion gap: 14 (ref 5–15)
BUN: 66 mg/dL — ABNORMAL HIGH (ref 6–20)
CO2: 30 mmol/L (ref 22–32)
Calcium: 8.4 mg/dL — ABNORMAL LOW (ref 8.9–10.3)
Chloride: 94 mmol/L — ABNORMAL LOW (ref 98–111)
Creatinine, Ser: 1.62 mg/dL — ABNORMAL HIGH (ref 0.44–1.00)
GFR calc Af Amer: 40 mL/min — ABNORMAL LOW (ref >60)
GFR calc non Af Amer: 34 mL/min — ABNORMAL LOW (ref >60)
Glucose, Bld: 99 mg/dL (ref 70–99)
Potassium: 3.8 mmol/L (ref 3.5–5.1)
Sodium: 138 mmol/L (ref 135–145)

## 2019-06-09 LAB — GLUCOSE, CAPILLARY
Glucose-Capillary: 101 mg/dL — ABNORMAL HIGH (ref 70–99)
Glucose-Capillary: 111 mg/dL — ABNORMAL HIGH (ref 70–99)
Glucose-Capillary: 129 mg/dL — ABNORMAL HIGH (ref 70–99)
Glucose-Capillary: 191 mg/dL — ABNORMAL HIGH (ref 70–99)
Glucose-Capillary: 295 mg/dL — ABNORMAL HIGH (ref 70–99)
Glucose-Capillary: 395 mg/dL — ABNORMAL HIGH (ref 70–99)

## 2019-06-09 LAB — CBC
HCT: 40.2 % (ref 36.0–46.0)
Hemoglobin: 12.2 g/dL (ref 12.0–15.0)
MCH: 21.9 pg — ABNORMAL LOW (ref 26.0–34.0)
MCHC: 30.3 g/dL (ref 30.0–36.0)
MCV: 72 fL — ABNORMAL LOW (ref 80.0–100.0)
Platelets: 423 10*3/uL — ABNORMAL HIGH (ref 150–400)
RBC: 5.58 MIL/uL — ABNORMAL HIGH (ref 3.87–5.11)
RDW: 17.2 % — ABNORMAL HIGH (ref 11.5–15.5)
WBC: 15.7 10*3/uL — ABNORMAL HIGH (ref 4.0–10.5)
nRBC: 0.3 % — ABNORMAL HIGH (ref 0.0–0.2)

## 2019-06-09 LAB — PROCALCITONIN: Procalcitonin: 0.4 ng/mL

## 2019-06-09 LAB — SAMPLE TO BLOOD BANK

## 2019-06-09 LAB — C-REACTIVE PROTEIN: CRP: 12.1 mg/dL — ABNORMAL HIGH (ref <1.0)

## 2019-06-09 LAB — MAGNESIUM: Magnesium: 2.2 mg/dL (ref 1.7–2.4)

## 2019-06-09 LAB — BRAIN NATRIURETIC PEPTIDE: B Natriuretic Peptide: 25.4 pg/mL (ref 0.0–100.0)

## 2019-06-09 LAB — HEPARIN LEVEL (UNFRACTIONATED)
Heparin Unfractionated: 0.7 IU/mL (ref 0.30–0.70)
Heparin Unfractionated: 0.57 IU/mL (ref 0.30–0.70)

## 2019-06-09 LAB — D-DIMER, QUANTITATIVE: D-Dimer, Quant: 8.5 ug/mL-FEU — ABNORMAL HIGH (ref 0.00–0.50)

## 2019-06-09 MED ORDER — PNEUMOCOCCAL VAC POLYVALENT 25 MCG/0.5ML IJ INJ
0.5000 mL | INJECTION | INTRAMUSCULAR | Status: AC
  Administered 2019-06-14: 0.5 mL via INTRAMUSCULAR
  Filled 2019-06-09 (×2): qty 0.5

## 2019-06-09 MED ORDER — VILAZODONE HCL 40 MG PO TABS
40.0000 mg | ORAL_TABLET | Freq: Every day | ORAL | Status: DC
  Administered 2019-06-09 – 2019-06-10 (×2): 40 mg via ORAL
  Filled 2019-06-09 (×2): qty 30
  Filled 2019-06-09: qty 1

## 2019-06-09 NOTE — Evaluation (Signed)
Occupational Therapy Evaluation Patient Details Name: Brittany Rubio MRN: [id number] DOB: 1961-06-25 Today's Date: 06/09/2019    History of Present Illness Pt is a 58 y/o female with PMHx including DM, HTN, anxiety, depression, breast CA, anemia admitted with severe hypoxic respiratory failure due to covid 19. Pt initially admitted to ICU for heated high flow, found to have DVT during admission and started on heparin.    Clinical Impression   This 58 y/o female presents with the above. PTA pt reports independence with ADL, iADL and functional mobility. Pt performing functional transfers, marching in place without AD at [facility] assist level. Pt tolerating marching/static standing >3 min prior to taking seated rest break. She currently requires minguard-minA for LB ADL. Pt on 15L HFNC with SpO2 overall maintaining >90%, all other VSS. She will benefit from continued acute OT services and currently recommend follow up [facility] services after discharge to maximize her safety and independence with ADL and mobility. Will follow.     Follow Up Recommendations  Home health OT;Supervision/Assistance - 24 hour(pending progress, spouse also admitted)    Equipment Recommendations  None recommended by OT           Precautions / Restrictions Precautions Precaution Comments: monitor O2 sats, on 15L HFNC Restrictions Weight Bearing Restrictions: No      Mobility Bed Mobility               General bed mobility comments: OOB in recliner  Transfers Overall transfer level: Modified independent Equipment used: None                  Balance Overall balance assessment: No apparent balance deficits (not formally assessed)                                         ADL either performed or assessed with clinical judgement   ADL Overall ADL's : Needs assistance/impaired Eating/Feeding: Modified independent;Sitting   Grooming: Set up;Sitting   Upper Body Bathing: Set  up;Sitting   Lower Body Bathing: Min guard;Sit to/from stand   Upper Body Dressing : Set up;Sitting   Lower Body Dressing: Min guard;Sit to/from stand   Toilet Transfer: Magazine features editor Details (indicate cue type and reason): simulated via transfer to/from recliner, pt marching in place and static standing > 58min during session (limited with mobility distance due to lines)  Toileting- Water quality scientist and Hygiene: Min guard;Sit to/from stand       Functional mobility during ADLs: Min guard                           Pertinent Vitals/Pain Pain Assessment: Faces Faces Pain Scale: Hurts a little bit Pain Location: headache Pain Descriptors / Indicators: Headache Pain Intervention(s): Limited activity within patient's tolerance;Monitored during session     Hand Dominance     Extremity/Trunk Assessment Upper Extremity Assessment Upper Extremity Assessment: Overall WFL for tasks assessed   Lower Extremity Assessment Lower Extremity Assessment: Defer to PT evaluation       Communication Communication Communication: No difficulties   Cognition Arousal/Alertness: Awake/alert Behavior During Therapy: WFL for tasks assessed/performed Overall Cognitive Status: Within Functional Limits for tasks assessed                                 General  Comments: very pleasant   General Comments       Exercises Exercises: Other exercises Other Exercises Other Exercises: issued theraband and verbally educated in HEP Other Exercises: use of IS, x5, pt pulling up to 750-1025ml    Shoulder Instructions      Home Living Family/patient expects to be discharged to:: Private residence Living Arrangements: Spouse/significant other Available Help at Discharge: Family Type of Home: House Home Access: Stairs to enter Technical brewer of Steps: 3   Home Layout: One level     Bathroom Shower/Tub: Tub/shower unit;Walk-in Management consultant: Standard     Home Equipment: Shower seat - built in          Prior Functioning/Environment Level of Independence: Independent        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: Decreased strength;Decreased activity tolerance;Obesity;Cardiopulmonary status limiting activity;Decreased knowledge of use of DME or AE      OT Treatment/Interventions: Self-care/ADL training;Therapeutic exercise;Energy conservation;DME and/or AE instruction;Therapeutic activities;Patient/family education;Balance training    OT Goals(Current goals can be found in the care plan section) Acute Rehab OT Goals Patient Stated Goal: home to her puppy and spouse OT Goal Formulation: With patient Time For Goal Achievement: 06/23/19 Potential to Achieve Goals: Good  OT Frequency: Min 3X/week   Barriers to D/C:            Co-evaluation PT/OT/SLP Co-Evaluation/Treatment: Yes Reason for Co-Treatment: For patient/therapist safety   OT goals addressed during session: ADL's and self-care      AM-PAC OT "6 Clicks" Daily Activity     Outcome Measure Help from another person eating meals?: None Help from another person taking care of personal grooming?: A Little Help from another person toileting, which includes using toliet, bedpan, or urinal?: A Little Help from another person bathing (including washing, rinsing, drying)?: A Little Help from another person to put on and taking off regular upper body clothing?: None Help from another person to put on and taking off regular lower body clothing?: A Little 6 Click Score: 20   End of Session Equipment Utilized During Treatment: Oxygen Nurse Communication: Mobility status  Activity Tolerance: Patient tolerated treatment well Patient left: in chair;with call bell/phone within reach  OT Visit Diagnosis: Other (comment);Muscle weakness (generalized) (M62.81)(decreased activity tolerance)                Time: 1545-1610 OT Time  Calculation (min): 25 min Charges:  OT General Charges $OT Visit: 1 Visit OT Evaluation $OT Eval Moderate Complexity: [place], OT [employer] Pager [phone] Office [place] 06/09/2019, 4:45 PM

## 2019-06-09 NOTE — Progress Notes (Signed)
ANTICOAGULATION CONSULT NOTE  Pharmacy Consult:  Heparin Indication: bilateral lower extremity DVT  Heparin Dosing Weight: 79.8 kg  Labs: Recent Labs    06/07/19 0333 06/08/19 0518  06/08/19 1814 06/09/19 0810 06/09/19 1828  HGB 12.2 13.0  --   --  12.2  --   HCT 39.8 42.1  --   --  40.2  --   PLT 289 358  --   --  423*  --   HEPARINUNFRC 0.39 1.04*   < > 0.67 0.70 0.57  CREATININE 0.80 1.38*  --   --  1.62*  --    < > = values in this interval not displayed.    Assessment: 66 YOF presenting with SOB, COVID-19+ from 05/27/19.  Pharmacy consulted to dose heparin drip for DVT, 11/21 dopplers show acute DVT of multiple veins within the right and left lower extremities   Heparin level high therapeutic this AM on 1700 units/hr. H/H wnl. Plt high. SCr up to 1.62   HL came back in goal tonight. We will do a repeat in AM.   Goal of Therapy:  Heparin level 0.3-0.7 units/ml Monitor platelets by anticoagulation protocol: Yes   Plan:  Cont heparin gtt 1650 units/hr  F/u HL in AM Follow up to transition to oral anticoagulation   Onnie Boer, PharmD, BCIDP, AAHIVP, CPP Infectious Disease Pharmacist 06/09/2019 7:59 PM

## 2019-06-09 NOTE — Progress Notes (Signed)
ANTICOAGULATION CONSULT NOTE  Pharmacy Consult:  Heparin Indication: bilateral lower extremity DVT  Heparin Dosing Weight: 79.8 kg  Labs: Recent Labs    06/07/19 0333 06/08/19 0518 06/08/19 0927 06/08/19 1814 06/09/19 0810  HGB 12.2 13.0  --   --  12.2  HCT 39.8 42.1  --   --  40.2  PLT 289 358  --   --  423*  HEPARINUNFRC 0.39 1.04* 0.24* 0.67 0.70  CREATININE 0.80 1.38*  --   --  1.62*    Assessment: 60 YOF presenting with SOB, COVID-19+ from 05/27/19.  Pharmacy consulted to dose heparin drip for DVT, 11/21 dopplers show acute DVT of multiple veins within the right and left lower extremities   Heparin level high therapeutic this AM on 1700 units/hr. H/H wnl. Plt high. SCr up to 1.62   Goal of Therapy:  Heparin level 0.3-0.7 units/ml Monitor platelets by anticoagulation protocol: Yes   Plan:  Decrease heparin gtt to 1650 units/hr  F/u 6 hr HL  Follow up to transition to oral anticoagulation   Albertina Parr, PharmD., BCPS Clinical Pharmacist Clinical phone for 06/09/19 until 3:30pm: [phone]

## 2019-06-09 NOTE — Progress Notes (Signed)
Pt's son called to patient. Pt able to update him of pt's current condition and plan of care.

## 2019-06-09 NOTE — Progress Notes (Signed)
ANTICOAGULATION CONSULT NOTE  Pharmacy Consult:  Heparin Indication: bilateral lower extremity DVT  Heparin Dosing Weight: 79.8 kg  Labs: Recent Labs    06/07/19 0318 06/07/19 0333 06/08/19 0518 06/08/19 0927 06/08/19 1814  HGB 12.9 12.2 13.0  --   --   HCT 38.0 39.8 42.1  --   --   PLT  --  289 358  --   --   HEPARINUNFRC  --  0.39 1.04* 0.24* 0.67  CREATININE  --  0.80 1.38*  --   --     Assessment: 57 YOF presenting with SOB, COVID-19+ from 05/27/19.  Pharmacy consulted to dose heparin drip for DVT, 11/21 dopplers show acute DVT of multiple veins within the right and left lower extremities   Heparin level high therapeutic this AM on 1700 units/hr. H/H wnl. Plt high. SCr up to 1.62   Goal of Therapy:  Heparin level 0.3-0.7 units/ml Monitor platelets by anticoagulation protocol: Yes   Plan:  Decrease heparin gtt to 1650 units/hr  F/u 6 hr HL  Follow up to transition to oral anticoagulation (likely 11/24 per Dr. Halford Chessman)  Albertina Parr, PharmD., BCPS Clinical Pharmacist Clinical phone for 06/09/19 until 3:30pm: [phone]

## 2019-06-09 NOTE — Evaluation (Addendum)
Physical Therapy Evaluation Patient Details Name: Brittany Rubio MRN: [id number] DOB: 1960/08/21 Today's Date: 06/09/2019   History of Present Illness  Pt is a 58 y/o female with PMHx including DM, HTN, anxiety, depression, breast CA, anemia admitted with severe hypoxic respiratory failure due to covid 19. Pt initially admitted to ICU for heated high flow, found to have DVT during admission and started on heparin.   Clinical Impression   The patient is  Received on 15 L HFNC with SPO2 maintaining > 90% while standing and marching in place. Patient's mobility is very good. Continue to increase activity and monitor SPO2/wean O2 as able . Pt admitted with above diagnosis.  Pt currently with functional limitations due to the deficits listed below (see PT Problem List). Pt will benefit from skilled PT to increase their independence and safety with mobility to allow discharge to the venue listed below.       Follow Up Recommendations No PT follow up;Home health PT    Equipment Recommendations  None recommended by PT    Recommendations for Other Services       Precautions / Restrictions Precautions Precaution Comments: monitor O2 sats, on 15L HFNC Restrictions Weight Bearing Restrictions: No      Mobility  Bed Mobility               General bed mobility comments: OOB in recliner  Transfers Overall transfer level: Modified independent Equipment used: None                Ambulation/Gait Ambulation/Gait assistance: Supervision Gait Distance (Feet): 30 Feet(march in place at recliner on 15 L HFNC) Assistive device: None Gait Pattern/deviations: Step-to pattern        Stairs            Wheelchair Mobility    Modified Rankin (Stroke Patients Only)       Balance Overall balance assessment: No apparent balance deficits (not formally assessed)                                           Pertinent Vitals/Pain Pain Assessment:  Faces Faces Pain Scale: Hurts a little bit Pain Location: headache Pain Descriptors / Indicators: Headache Pain Intervention(s): Monitored during session    Home Living Family/patient expects to be discharged to:: Private residence Living Arrangements: Spouse/significant other Available Help at Discharge: Family Type of Home: House Home Access: Stairs to enter   Technical brewer of Steps: 3 Home Layout: One level Home Equipment: Shower seat - built in      Prior Function Level of Independence: Independent         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        Extremity/Trunk Assessment   Upper Extremity Assessment Upper Extremity Assessment: Generalized weakness    Lower Extremity Assessment Lower Extremity Assessment: Generalized weakness    Cervical / Trunk Assessment Cervical / Trunk Assessment: Normal  Communication   Communication: No difficulties  Cognition Arousal/Alertness: Awake/alert Behavior During Therapy: WFL for tasks assessed/performed Overall Cognitive Status: Within Functional Limits for tasks assessed                                 General Comments: very pleasant      General Comments      Exercises  Other Exercises Other Exercises: issued theraband and verbally educated in HEP Other Exercises: use of IS, x5, pt pulling up to 750-1080ml    Assessment/Plan    PT Assessment Patient needs continued PT services  PT Problem List Decreased strength;Decreased mobility;Decreased range of motion;Decreased knowledge of precautions;Decreased activity tolerance;Cardiopulmonary status limiting activity;Decreased knowledge of use of DME       PT Treatment Interventions DME instruction;Therapeutic activities;Gait training;Therapeutic exercise;Patient/family education;Functional mobility training;Stair training    PT Goals (Current goals can be found in the Care Plan section)  Acute Rehab PT  Goals Patient Stated Goal: home to her puppy and spouse PT Goal Formulation: With patient Time For Goal Achievement: 06/23/19 Potential to Achieve Goals: Good    Frequency Min 3X/week   Barriers to discharge   spouse is at [facility]    Co-evaluation PT/OT/SLP Co-Evaluation/Treatment: Yes Reason for Co-Treatment: For patient/therapist safety PT goals addressed during session: Mobility/safety with mobility OT goals addressed during session: ADL's and self-care       AM-PAC PT "6 Clicks" Mobility  Outcome Measure Help needed turning from your back to your side while in a flat bed without using bedrails?: None Help needed moving from lying on your back to sitting on the side of a flat bed without using bedrails?: None Help needed moving to and from a bed to a chair (including a wheelchair)?: None Help needed standing up from a chair using your arms (e.g., wheelchair or bedside chair)?: None Help needed to walk in hospital room?: A Little Help needed climbing 3-5 steps with a railing? : A Lot 6 Click Score: 21    End of Session Equipment Utilized During Treatment: Oxygen Activity Tolerance: Patient tolerated treatment well Patient left: in chair;with call bell/phone within reach;with nursing/sitter in room Nurse Communication: Mobility status PT Visit Diagnosis: Unsteadiness on feet (R26.81);Difficulty in walking, not elsewhere classified (R26.2)    Time: 1545-1610 PT Time Calculation (min) (ACUTE ONLY): 25 min   Charges:   PT Evaluation $PT Eval Moderate Complexity: [place] Pager [phone] Office [phone]   Brittany Rubio 06/09/2019, 5:50 PM

## 2019-06-09 NOTE — Progress Notes (Signed)
[facility] PCCM  Seen briefly on rounds Oxygenation improving Says she feels better Agree with current management per [facility] No changes recommended  Roselie Awkward, MD [place] Pager: [phone] Cell: [phone] If no response, call [phone]

## 2019-06-09 NOTE — Progress Notes (Signed)
Spoke with and updated patient's son during call with patient. All questions/ concerns answered at this time.

## 2019-06-09 NOTE — Progress Notes (Signed)
PROGRESS NOTE                                                                                                                                                                                                             Patient Demographics:    Brittany Rubio, is a 58 y.o. female, DOB - [birth year], GTX:[id number]  Outpatient Primary MD for the patient is Glendale Chard, MD    LOS - 5  Admit date - 06/04/2019    Chief Complaint  Patient presents with   Shortness of Breath       Brief Narrative  58 yo female developed a non productive cough, subjective fever, chills, myalgias 10 days prior to admission.  She then developed progressive dyspnea after testing positive for COVID 19 on 05/27/19.  She developed severe hypoxic respiratory failure and was kept in ICU on heated high flow, was found to have DVT during this admission and started on heparin complicated by mild nosebleed.  Hypoxia has improved she still on heated high flow and transferred to my service on 06/09/2019.   Subjective:    Brittany Rubio today has, No headache, No chest pain, No abdominal pain - No Nausea, No new weakness tingling or numbness, mild nosebleed intermittent, no cough but positive shortness of breath on exertion.   Assessment  & Plan :    Today's labs are pending will follow.   1. Acute Hypoxic Resp. Failure due to Acute Covid 19 Viral Pneumonitis during the ongoing 2020 Covid 19 Pandemic - she has severe parenchymal lung disease due to Covid pneumonia, she had symptoms for about 10 days before her being hospitalized to ICU.  She was started on IV steroids and remdesivir in the ICU for 6 days, transferred to my service today.  Gradual improvement, still on heated high flow continue.  Monitor inflammatory markers.  Today's labs are pending.     Encouraged her to sit up in chair in the daytime use I-S and flutter valve for pulmonary toiletry and  then prone in bed when at night.    SpO2: 97 % O2 Flow Rate (L/min): 20 L/min FiO2 (%): 100 %  Hepatic Function Latest Ref Rng & Units 06/06/2019 06/05/2019 06/04/2019  Total Protein 6.5 - 8.1 g/dL 7.1 7.1 7.6  Albumin 3.5 - 5.0 g/dL 2.6(L) 2.7(L) 2.9(L)  AST 15 -  41 U/L 36 30 30  ALT 0 - 44 U/L 25 29 32  Alk Phosphatase 38 - 126 U/L 117 67 57  Total Bilirubin 0.3 - 1.2 mg/dL 0.6 0.6 0.4    FiO2 (%):  [100 %] 100 %   COVID-19 Labs  No results for input(s): DDIMER, FERRITIN, LDH, CRP in the last 72 hours.  Lab Results  Component Value Date   SARSCOV2NAA Detected (A) 05/27/2019      Component Value Date/Time   BNP 291.9 (H) 11/26/2016 1836    2.  Acute bilateral lower extremity DVT.  Currently on heparin drip due to intermittent nosebleed.  Monitor.  Once nosebleed has stabilized we will transition to oral Eliquis.  3.  Story of hypothyroidism.  On Synthroid.  4.  History of anxiety and depression.  Home medications continued.  5.  GERD.  On Pepcid.  6.  Obesity with BMI of 35.8.  Follow with PCP for weight loss.  7.  OSA.  Nighttime oxygen here.  CPAP at home.  8.  DM type II on Levemir and sliding scale will monitor and adjust.  Poor outpatient control due to hyperglycemia.  Lab Results  Component Value Date   HGBA1C 8.7 (H) 05/18/2019   CBG (last 3)  Recent Labs    06/08/19 2347 06/09/19 0323 06/09/19 0746  GLUCAP 166* 129* 111*     Condition - Guarded  Family Communication  :   Called husband's cell phone he is hospitalized as well.  Code Status :  Full  Diet :   Diet Order            Diet Carb Modified Fluid consistency: Thin; Room service appropriate? Yes  Diet effective now               Disposition Plan  :  ICU  Consults  :  PCCM  Procedures  :    Echo 11/20 >> EF 60 to 65%, mod TR, mod elevation in PASP  Leg [place] -   Right: Findings consistent with acute deep vein thrombosis involving the right posterior tibial veins, right  soleal veins, and right gastrocnemius veins.   Left: Findings consistent with acute deep vein thrombosis involving the left posterior tibial veins, and left peroneal veins.  PUD Prophylaxis : pepcid  DVT Prophylaxis  :   Heparin gtt  Lab Results  Component Value Date   PLT 423 (H) 06/09/2019    Inpatient Medications  Scheduled Meds:  aspirin EC  81 mg Oral Daily   Chlorhexidine Gluconate Cloth  6 each Topical Daily   cholecalciferol  1,000 Units Oral Daily   dexamethasone (DECADRON) injection  6 mg Intravenous Q24H   famotidine  20 mg Oral BID   insulin aspart  0-20 Units Subcutaneous Q4H   insulin detemir  7 Units Subcutaneous BID   levothyroxine  224 mcg Oral QAC breakfast   mouth rinse  15 mL Mouth Rinse BID   [START ON 06/10/2019] pneumococcal 23 valent vaccine  0.5 mL Intramuscular Tomorrow-1000   sodium chloride flush  10-40 mL Intracatheter Q12H   sodium chloride flush  3 mL Intravenous Q12H   traZODone  100 mg Oral QHS   Vilazodone HCl  40 mg Oral Daily   vitamin C  250 mg Oral Daily   zinc sulfate  220 mg Oral Daily   Continuous Infusions:  sodium chloride 250 mL (06/09/19 0915)   heparin 1,700 Units/hr (06/09/19 0810)   remdesivir 100 mg in NS 250  mL 100 mg (06/09/19 0923)   PRN Meds:.sodium chloride, acetaminophen **OR** acetaminophen, albuterol, butorphanol, metoprolol tartrate, ondansetron **OR** ondansetron (ZOFRAN) IV  Antibiotics  :    Anti-infectives (From admission, onward)   Start     Dose/Rate Route Frequency Ordered Stop   06/07/19 1000  azithromycin (ZITHROMAX) tablet 500 mg  Status:  Discontinued     500 mg Oral Daily 06/07/19 0830 06/08/19 0856   06/06/19 1000  remdesivir 100 mg in sodium chloride 0.9 % 250 mL IVPB     100 mg 500 mL/hr over 30 Minutes Intravenous Every 24 hours 06/05/19 0743 06/10/19 0959   06/05/19 1000  cefTRIAXone (ROCEPHIN) 2 g in sodium chloride 0.9 % 100 mL IVPB  Status:  Discontinued     2 g 200  mL/hr over 30 Minutes Intravenous Every 24 hours 06/05/19 0831 06/08/19 0856   06/05/19 1000  azithromycin (ZITHROMAX) 500 mg in sodium chloride 0.9 % 250 mL IVPB  Status:  Discontinued     500 mg 250 mL/hr over 60 Minutes Intravenous Every 24 hours 06/05/19 0831 06/07/19 0830   06/05/19 0830  remdesivir 200 mg in sodium chloride 0.9 % 250 mL IVPB     200 mg 500 mL/hr over 30 Minutes Intravenous Once 06/05/19 0743 06/05/19 1115       Time Spent in minutes  30   Lala Lund M.D on 06/09/2019 at 9:28 AM  To page go to [email or web address] - password [facility]  [facility] -  Office  [phone]   See all Orders from today for further details    Objective:   Vitals:   06/09/19 0600 06/09/19 0700 06/09/19 0800 06/09/19 0822  BP: 105/69 104/67  104/72  Pulse: 71 76  86  Resp: 19 (!) 23  (!) 27  Temp:   98.3 F (36.8 C)   TempSrc:   Oral   SpO2: 94% (!) 4%  97%  Weight:      Height:        Wt Readings from Last 3 Encounters:  06/09/19 97.8 kg  05/18/19 103.1 kg  02/10/19 101.2 kg     Intake/Output Summary (Last 24 hours) at 06/09/2019 0928 Last data filed at 06/09/2019 0800 Gross per 24 hour  Intake 552.76 ml  Output 250 ml  Net 302.76 ml     Physical Exam  Awake Alert, Oriented X 3, No new F.N deficits, Normal affect [place].AT,PERRAL Supple Neck,No JVD, No cervical lymphadenopathy appriciated.  Symmetrical Chest wall movement, Good air movement bilaterally, CTAB RRR,No Gallops,Rubs or new Murmurs, No Parasternal Heave +ve B.Sounds, Abd Soft, No tenderness, No organomegaly appriciated, No rebound - guarding or rigidity. No Cyanosis, Clubbing or edema, No new Rash or bruise      Data Review:    CBC Recent Labs  Lab 06/05/19 0700 06/06/19 0606 06/07/19 0318 06/07/19 0333 06/08/19 0518 06/09/19 0810  WBC 11.2* 15.8*  --  14.3* 12.8* 15.7*  HGB 12.1 11.8* 12.9 12.2 13.0 12.2  HCT 38.7 38.5 38.0 39.8 42.1 40.2  PLT 212 262  --  289 358 423*  MCV 71.5*  72.0*  --  72.5* 71.7* 72.0*  MCH 22.4* 22.1*  --  22.2* 22.1* 21.9*  MCHC 31.3 30.6  --  30.7 30.9 30.3  RDW 17.2* 17.4*  --  17.3* 17.2* 17.2*  LYMPHSABS 0.7 1.3  --   --   --   --   MONOABS 0.6 1.3*  --   --   --   --  EOSABS 0.0 0.0  --   --   --   --   BASOSABS 0.1 0.0  --   --   --   --     Chemistries  Recent Labs  Lab 06/04/19 1918  06/05/19 0056  06/05/19 0700 06/06/19 0606 06/07/19 0318 06/07/19 0333 06/08/19 0518  NA 137   < >  --    < > 134* 140 139 141 136  K 4.5   < >  --    < > 4.5 4.3 3.8 4.2 3.4*  CL 97*  --   --   --  94* 98  --  100 95*  CO2 23  --   --   --  22 26  --  27 27  GLUCOSE 377*  --   --   --  317* 118*  --  95 88  BUN 31*  --   --   --  25* 18  --  24* 33*  CREATININE 1.73*  --   --   --  1.24* 0.85  --  0.80 1.38*  CALCIUM 8.4*  --   --   --  8.0* 8.1*  --  8.2* 8.3*  MG  --   --  2.6*  --  2.4 2.0  --  1.9 1.9  AST 30  --   --   --  30 36  --   --   --   ALT 32  --   --   --  29 25  --   --   --   ALKPHOS 57  --   --   --  67 117  --   --   --   BILITOT 0.4  --   --   --  0.6 0.6  --   --   --    < > = values in this interval not displayed.   ------------------------------------------------------------------------------------------------------------------ No results for input(s): CHOL, HDL, LDLCALC, TRIG, CHOLHDL, LDLDIRECT in the last 72 hours.  Lab Results  Component Value Date   HGBA1C 8.7 (H) 05/18/2019   ------------------------------------------------------------------------------------------------------------------ No results for input(s): TSH, T4TOTAL, T3FREE, THYROIDAB in the last 72 hours.  Invalid input(s): FREET3  Cardiac Enzymes No results for input(s): CKMB, TROPONINI, MYOGLOBIN in the last 168 hours.  Invalid input(s): CK ------------------------------------------------------------------------------------------------------------------    Component Value Date/Time   BNP 291.9 (H) 11/26/2016 1836    Micro  Results Recent Results (from the past 240 hour(s))  Culture, Urine     Status: Abnormal   Collection Time: 06/05/19 12:30 AM   Specimen: Urine, Random  Result Value Ref Range Status   Specimen Description URINE, RANDOM  Final   Special Requests NONE  Final   Culture (A)  Final    <10,000 COLONIES/mL INSIGNIFICANT GROWTH Performed at [facility], [address], [place], [place] [address]    Report Status 06/06/2019 FINAL  Final  Culture, blood (routine x 2)     Status: None (Preliminary result)   Collection Time: 06/05/19 12:57 AM   Specimen: BLOOD RIGHT HAND  Result Value Ref Range Status   Specimen Description BLOOD RIGHT HAND  Final   Special Requests   Final    BOTTLES DRAWN AEROBIC AND ANAEROBIC Blood Culture adequate volume   Culture   Final    NO GROWTH 3 DAYS Performed at [facility], [place] [address], [place], [place] [address]    Report Status PENDING  Incomplete  Culture, blood (routine x 2)     Status: None (Preliminary result)   Collection Time: 06/05/19  1:06 AM   Specimen: BLOOD LEFT FOREARM  Result Value Ref Range Status   Specimen Description BLOOD LEFT FOREARM  Final   Special Requests   Final    BOTTLES DRAWN AEROBIC AND ANAEROBIC Blood Culture adequate volume   Culture   Final    NO GROWTH 3 DAYS Performed at [facility], [address] [place], [place] [address]    Report Status PENDING  Incomplete  MRSA PCR Screening     Status: None   Collection Time: 06/05/19  7:03 PM   Specimen: Nasal Mucosa; Nasopharyngeal  Result Value Ref Range Status   MRSA by PCR NEGATIVE NEGATIVE Final    Comment:        The GeneXpert MRSA Assay (FDA approved for NASAL specimens only), is one component of a comprehensive MRSA colonization surveillance program. It is not intended to diagnose MRSA infection nor to guide or monitor treatment for MRSA infections. Performed at [facility], [place] [address] [place],  [address]      Radiology Reports Dg Chest [place] 1 View  Result Date: 06/07/2019 CLINICAL DATA:  History of intubation EXAM: PORTABLE CHEST 1 VIEW COMPARISON:  Three days ago FINDINGS: No endotracheal tube is seen. Patchy bilateral pneumonia with possible mild clearing in the subpleural right lung. Cardiomegaly and vascular pedicle widening. Possible vascular congestion. Low lung volumes. No pneumothorax IMPRESSION: 1. Low volume chest with multifocal pneumonia that is mildly improved. 2. There is cardiomegaly and possible vascular congestion. Electronically Signed   By: Monte Fantasia M.D.   On: 06/07/2019 07:57   Xr Chest Portable  Result Date: 06/04/2019 CLINICAL DATA:  Shortness of breath.  Coronavirus infection. EXAM: PORTABLE CHEST 1 VIEW COMPARISON:  08/19/2018 FINDINGS: Heart size upper limits of normal. Extensive bilateral patchy pulmonary infiltrates consistent with viral pneumonia. No dense consolidation, collapse or effusion. No significant bone finding. IMPRESSION: Extensive patchy bilateral pulmonary infiltrates consistent with viral pneumonia. Electronically Signed   By: Nelson Chimes M.D.   On: 06/04/2019 19:12   Vas [place] Lower Extremity Venous (dvt)  Result Date: 06/07/2019  Lower Venous Study Indications: Elevated D-Dimer, Covid positive.  Comparison Study: No prior study on file for comparison Performing Technologist: Sharion Dove RVS  Examination Guidelines: A complete evaluation includes B-mode imaging, spectral Doppler, color Doppler, and power Doppler as needed of all accessible portions of each vessel. Bilateral testing is considered an integral part of a complete examination. Limited examinations for reoccurring indications may be performed as noted.  +---------+---------------+---------+-----------+----------+--------------+  RIGHT     Compressibility Phasicity Spontaneity Properties Thrombus Aging  +---------+---------------+---------+-----------+----------+--------------+  CFV        Full                                                             +---------+---------------+---------+-----------+----------+--------------+  SFJ       Full                                                             +---------+---------------+---------+-----------+----------+--------------+  FV Prox   Full                                                             +---------+---------------+---------+-----------+----------+--------------+  FV Mid    Full                                                             +---------+---------------+---------+-----------+----------+--------------+  FV Distal Full                                                             +---------+---------------+---------+-----------+----------+--------------+  PFV       Full                                                             +---------+---------------+---------+-----------+----------+--------------+  POP       Full                                                             +---------+---------------+---------+-----------+----------+--------------+  PTV       None                                             Acute           +---------+---------------+---------+-----------+----------+--------------+  PERO      Full                                                             +---------+---------------+---------+-----------+----------+--------------+  Soleal    None                                             Acute           +---------+---------------+---------+-----------+----------+--------------+  Gastroc   None                                             Acute           +---------+---------------+---------+-----------+----------+--------------+   +---------+---------------+---------+-----------+----------+--------------+  LEFT      Compressibility Phasicity Spontaneity Properties Thrombus Aging  +---------+---------------+---------+-----------+----------+--------------+  CFV       Full            Yes       Yes                                     +---------+---------------+---------+-----------+----------+--------------+  SFJ       Full                                                             +---------+---------------+---------+-----------+----------+--------------+  FV Prox   Full                                                             +---------+---------------+---------+-----------+----------+--------------+  FV Mid    Full                                                             +---------+---------------+---------+-----------+----------+--------------+  FV Distal Full                                                             +---------+---------------+---------+-----------+----------+--------------+  PFV       Full                                                             +---------+---------------+---------+-----------+----------+--------------+  POP       Full            Yes       Yes                                    +---------+---------------+---------+-----------+----------+--------------+  PTV       None                                             Acute           +---------+---------------+---------+-----------+----------+--------------+  PERO      None  Acute           +---------+---------------+---------+-----------+----------+--------------+     Summary: Right: Findings consistent with acute deep vein thrombosis involving the right posterior tibial veins, right soleal veins, and right gastrocnemius veins. Left: Findings consistent with acute deep vein thrombosis involving the left posterior tibial veins, and left peroneal veins.  *See table(s) above for measurements and observations. Electronically signed by Servando Snare MD on 06/07/2019 at 10:58:31 AM.    Final

## 2019-06-09 NOTE — Progress Notes (Signed)
Patient able to use incentive spirometer and can reach 750 mL with good technique.

## 2019-06-10 DIAGNOSIS — F329 Major depressive disorder, single episode, unspecified: Secondary | ICD-10-CM | POA: Diagnosis present

## 2019-06-10 DIAGNOSIS — G4733 Obstructive sleep apnea (adult) (pediatric): Secondary | ICD-10-CM

## 2019-06-10 DIAGNOSIS — F320 Major depressive disorder, single episode, mild: Secondary | ICD-10-CM

## 2019-06-10 DIAGNOSIS — F32A Depression, unspecified: Secondary | ICD-10-CM | POA: Diagnosis present

## 2019-06-10 DIAGNOSIS — F988 Other specified behavioral and emotional disorders with onset usually occurring in childhood and adolescence: Secondary | ICD-10-CM | POA: Diagnosis present

## 2019-06-10 DIAGNOSIS — F419 Anxiety disorder, unspecified: Secondary | ICD-10-CM

## 2019-06-10 DIAGNOSIS — C73 Malignant neoplasm of thyroid gland: Secondary | ICD-10-CM

## 2019-06-10 DIAGNOSIS — F900 Attention-deficit hyperactivity disorder, predominantly inattentive type: Secondary | ICD-10-CM

## 2019-06-10 LAB — HEPARIN LEVEL (UNFRACTIONATED)
Heparin Unfractionated: 0.7 IU/mL (ref 0.30–0.70)
Heparin Unfractionated: 0.53 IU/mL (ref 0.30–0.70)

## 2019-06-10 LAB — COMPREHENSIVE METABOLIC PANEL
ALT: 26 U/L (ref 0–44)
AST: 27 U/L (ref 15–41)
Albumin: 2.8 g/dL — ABNORMAL LOW (ref 3.5–5.0)
Alkaline Phosphatase: 79 U/L (ref 38–126)
Anion gap: 13 (ref 5–15)
BUN: 60 mg/dL — ABNORMAL HIGH (ref 6–20)
CO2: 27 mmol/L (ref 22–32)
Calcium: 8.9 mg/dL (ref 8.9–10.3)
Chloride: 96 mmol/L — ABNORMAL LOW (ref 98–111)
Creatinine, Ser: 1.17 mg/dL — ABNORMAL HIGH (ref 0.44–1.00)
GFR calc Af Amer: 59 mL/min — ABNORMAL LOW (ref >60)
GFR calc non Af Amer: 51 mL/min — ABNORMAL LOW (ref >60)
Glucose, Bld: 69 mg/dL — ABNORMAL LOW (ref 70–99)
Potassium: 3.7 mmol/L (ref 3.5–5.1)
Sodium: 136 mmol/L (ref 135–145)
Total Bilirubin: 0.5 mg/dL (ref 0.3–1.2)
Total Protein: 7.5 g/dL (ref 6.5–8.1)

## 2019-06-10 LAB — MAGNESIUM: Magnesium: 2.3 mg/dL (ref 1.7–2.4)

## 2019-06-10 LAB — GLUCOSE, CAPILLARY
Glucose-Capillary: 65 mg/dL — ABNORMAL LOW (ref 70–99)
Glucose-Capillary: 73 mg/dL (ref 70–99)
Glucose-Capillary: 78 mg/dL (ref 70–99)
Glucose-Capillary: 173 mg/dL — ABNORMAL HIGH (ref 70–99)
Glucose-Capillary: 243 mg/dL — ABNORMAL HIGH (ref 70–99)
Glucose-Capillary: 218 mg/dL — ABNORMAL HIGH (ref 70–99)
Glucose-Capillary: 298 mg/dL — ABNORMAL HIGH (ref 70–99)
Glucose-Capillary: 126 mg/dL — ABNORMAL HIGH (ref 70–99)

## 2019-06-10 LAB — CBC WITH DIFFERENTIAL/PLATELET
Abs Immature Granulocytes: 0.83 10*3/uL — ABNORMAL HIGH (ref 0.00–0.07)
Basophils Absolute: 0.1 10*3/uL (ref 0.0–0.1)
Basophils Relative: 1 %
Eosinophils Absolute: 0.4 10*3/uL (ref 0.0–0.5)
Eosinophils Relative: 3 %
HCT: 39.5 % (ref 36.0–46.0)
Hemoglobin: 12.1 g/dL (ref 12.0–15.0)
Immature Granulocytes: 5 %
Lymphocytes Relative: 16 %
Lymphs Abs: 2.5 10*3/uL (ref 0.7–4.0)
MCH: 22 pg — ABNORMAL LOW (ref 26.0–34.0)
MCHC: 30.6 g/dL (ref 30.0–36.0)
MCV: 71.8 fL — ABNORMAL LOW (ref 80.0–100.0)
Monocytes Absolute: 1.3 10*3/uL — ABNORMAL HIGH (ref 0.1–1.0)
Monocytes Relative: 8 %
Neutro Abs: 10.5 10*3/uL — ABNORMAL HIGH (ref 1.7–7.7)
Neutrophils Relative %: 67 %
Platelets: 470 10*3/uL — ABNORMAL HIGH (ref 150–400)
RBC: 5.5 MIL/uL — ABNORMAL HIGH (ref 3.87–5.11)
RDW: 16.9 % — ABNORMAL HIGH (ref 11.5–15.5)
WBC: 15.6 10*3/uL — ABNORMAL HIGH (ref 4.0–10.5)
nRBC: 0.1 % (ref 0.0–0.2)

## 2019-06-10 LAB — CULTURE, BLOOD (ROUTINE X 2)
Culture: NO GROWTH
Culture: NO GROWTH
Special Requests: ADEQUATE
Special Requests: ADEQUATE

## 2019-06-10 LAB — D-DIMER, QUANTITATIVE: D-Dimer, Quant: 5.86 ug/mL-FEU — ABNORMAL HIGH (ref 0.00–0.50)

## 2019-06-10 LAB — PROCALCITONIN: Procalcitonin: 0.32 ng/mL

## 2019-06-10 LAB — BRAIN NATRIURETIC PEPTIDE: B Natriuretic Peptide: 18 pg/mL (ref 0.0–100.0)

## 2019-06-10 LAB — C-REACTIVE PROTEIN: CRP: 7.4 mg/dL — ABNORMAL HIGH (ref <1.0)

## 2019-06-10 MED ORDER — VILAZODONE HCL 20 MG PO TABS
40.0000 mg | ORAL_TABLET | Freq: Every day | ORAL | Status: DC
  Administered 2019-06-10 – 2019-06-15 (×6): 40 mg via ORAL
  Filled 2019-06-10 (×8): qty 2

## 2019-06-10 MED ORDER — FAMOTIDINE 20 MG PO TABS
20.0000 mg | ORAL_TABLET | Freq: Every day | ORAL | Status: DC
  Administered 2019-06-10 – 2019-06-11 (×2): 20 mg via ORAL
  Filled 2019-06-10 (×2): qty 1

## 2019-06-10 NOTE — Progress Notes (Signed)
ANTICOAGULATION CONSULT NOTE  Pharmacy Consult:  Heparin Indication: bilateral lower extremity DVT  Heparin Dosing Weight: 79.8 kg  Labs: Recent Labs    06/08/19 0518  06/09/19 0810 06/09/19 1828 06/10/19 0250  HGB 13.0  --  12.2  --  12.1  HCT 42.1  --  40.2  --  39.5  PLT 358  --  423*  --  470*  HEPARINUNFRC 1.04*   < > 0.70 0.57 0.70  CREATININE 1.38*  --  1.62*  --  1.17*   < > = values in this interval not displayed.    Assessment: 42 YOF presenting with SOB, COVID-19+ from 05/27/19.  Pharmacy consulted to dose heparin drip for DVT, 11/21 dopplers show acute DVT of multiple veins within the right and left lower extremities   Heparin level high therapeutic this AM on 1700 units/hr. H/H wnl. Plt high. SCr up to 1.62   HL came back in goal tonight. We will do a repeat in AM.   06/10/19 0500 UPDATE: Heparin level: 0.70 IU/mL at 0250,  at high end of therapeutic goal range CBC is WNL and D-Dimer is trending down No signs or symptoms of bleeding and no issues with infusion per RN  Goal of Therapy:  Heparin level 0.3-0.7 units/ml Monitor platelets by anticoagulation protocol: Yes   Plan:  Decrease heparin infusion to 1600 units/hr Re-check heparin level at ~1100 Follow up to transition to oral anticoagulation   Despina Pole, Pharm. D. Clinical Pharmacist 06/10/2019 5:22 AM

## 2019-06-10 NOTE — Progress Notes (Signed)
Results for ONESTI, CONARD (MRN [id number]) as of 06/10/2019 11:13  Ref. Range 06/09/2019 23:34 06/10/2019 02:26 06/10/2019 02:51 06/10/2019 07:00 06/10/2019 08:14  Glucose-Capillary Latest Ref Range: 70 - 99 mg/dL 101 (H) 65 (L) 73 78 173 (H)  Noted that patient had a low CBG of 65 mg/dl at 0226 this am.   Recommend changing Novolog RESISTANT correction scale to TID and HS scale since patient is eating.  May consider decreasing Levemir to 6 units BID if blood sugars continue to be less than 70 mg/dl.  Harvel Ricks RN BSN CDE Diabetes Coordinator Pager: [phone]  8am-5pm

## 2019-06-10 NOTE — Progress Notes (Signed)
PROGRESS NOTE    EVER GLAVE  [id number] DOB: 1961/02/12 DOA: 06/04/2019 PCP: Glendale Chard, MD   Brief Narrative:   58 yo BF PMHx ADD, Anxiety, Depression, HTN, HLD, OSA on CPAP, Thyroid CA S/P XRT, DM Type 2 uncontrolled with complication  Developed a non productive cough, subjective fever, chills, myalgias 10 days prior to admission. She then developed progressive dyspnea after testing positive for COVID 19 on 05/27/19.  She developed severe hypoxic respiratory failure and was kept in ICU on heated high flow, was found to have DVT during this admission and started on heparin complicated by mild nosebleed.  Hypoxia has improved she still on heated high flow and transferred to my service on 06/09/2019.   Subjective: Afebrile last 24 hours, A/O x4, negative CP, negative N/V/D, positive S OB.  Negative smoker.  States has been sick also in hospital.  Believes contracted Covid from husband who works in hospital.   Assessment & Plan:   Principal Problem:   Pneumonia due to COVID-19 virus Active Problems:   Hypertension   AKI (acute kidney injury) ([place])   Lobar pneumonia ([place])   Type 2 diabetes mellitus with microalbuminuria, without long-term current use of insulin (HCC)   Acute on chronic respiratory failure with hypoxia (HCC)   Cancer of thyroid gland (HCC)   Severe obstructive sleep apnea-hypopnea syndrome   Severe sepsis ([place])   COVID-19 virus infection   ADD (attention deficit disorder)   Depression   Anxiety   Covid pneumonia/acute on chronic respiratory failure with hypoxia COVID-19 Labs  Recent Labs    06/09/19 0810 06/10/19 0250  DDIMER 8.50* 5.86*  CRP 12.1* 7.4*    Lab Results  Component Value Date   SARSCOV2NAA Detected (A) 05/27/2019      DVT prophylaxis: Heparin per pharmacy Code Status: Full Family Communication:  Disposition Plan: TBD   Consultants:    Procedures/Significant Events:     I have personally reviewed and  interpreted all radiology studies and my findings are as above.  VENTILATOR SETTINGS: HFNC Flow; 6 L/min     Cultures   Antimicrobials: Anti-infectives (From admission, onward)   Start     Stop   06/07/19 1000  azithromycin (ZITHROMAX) tablet 500 mg  Status:  Discontinued     06/08/19 0856   06/06/19 1000  remdesivir 100 mg in sodium chloride 0.9 % 250 mL IVPB     06/09/19 0955   06/05/19 1000  cefTRIAXone (ROCEPHIN) 2 g in sodium chloride 0.9 % 100 mL IVPB  Status:  Discontinued     06/08/19 0856   06/05/19 1000  azithromycin (ZITHROMAX) 500 mg in sodium chloride 0.9 % 250 mL IVPB  Status:  Discontinued     06/07/19 0830   06/05/19 0830  remdesivir 200 mg in sodium chloride 0.9 % 250 mL IVPB     06/05/19 1115       Devices    LINES / TUBES:     Continuous Infusions: . sodium chloride Stopped (06/09/19 1104)  . heparin 1,600 Units/hr (06/10/19 0525)     Objective: Vitals:   06/10/19 0400 06/10/19 0500 06/10/19 0600 06/10/19 0700  BP: (!) 103/58 108/63 109/72 124/61  Pulse: 74 75 81 81  Resp: 18 (!) 22 (!) 25 19  Temp: 98.1 F (36.7 C)     TempSrc: Oral     SpO2: 97% 100% 100% 99%  Weight:  96.9 kg    Height:        Intake/Output Summary (Last  24 hours) at 06/10/2019 0755 Last data filed at 06/10/2019 0600 Gross per 24 hour  Intake 1510.04 ml  Output 950 ml  Net 560.04 ml   Filed Weights   06/08/19 0312 06/09/19 0350 06/10/19 0500  Weight: 97.6 kg 97.8 kg 96.9 kg    Examination:  General: A/O x4, positive acute respiratory distress Eyes: negative scleral hemorrhage, negative anisocoria, negative icterus ENT: Negative Runny nose, negative gingival bleeding, Neck:  Negative scars, masses, torticollis, lymphadenopathy, JVD Lungs: Tight, decreased breath sounds diffusely without wheezes or crackles Cardiovascular: Regular rate and rhythm without murmur gallop or rub normal S1 and S2 Abdomen: negative abdominal pain, nondistended, positive soft,  bowel sounds, no rebound, no ascites, no appreciable mass Extremities: No significant cyanosis, clubbing, or edema bilateral lower extremities Skin: Negative rashes, lesions, ulcers Psychiatric:  Negative depression, negative anxiety, negative fatigue, negative mania  Central nervous system:  Cranial nerves II through XII intact, tongue/uvula midline, all extremities muscle strength 5/5, sensation intact throughout,  negative dysarthria, negative expressive aphasia, negative receptive aphasia.  .     Data Reviewed: Care during the described time interval was provided by me .  I have reviewed this patient's available data, including medical history, events of note, physical examination, and all test results as part of my evaluation.   CBC: Recent Labs  Lab 06/05/19 0700 06/06/19 0606 06/07/19 0318 06/07/19 0333 06/08/19 0518 06/09/19 0810 06/10/19 0250  WBC 11.2* 15.8*  --  14.3* 12.8* 15.7* 15.6*  NEUTROABS 9.3* 12.7*  --   --   --   --  10.5*  HGB 12.1 11.8* 12.9 12.2 13.0 12.2 12.1  HCT 38.7 38.5 38.0 39.8 42.1 40.2 39.5  MCV 71.5* 72.0*  --  72.5* 71.7* 72.0* 71.8*  PLT 212 262  --  289 358 423* [id number]*   Basic Metabolic Panel: Recent Labs  Lab 06/05/19 0700 06/06/19 0606 06/07/19 0318 06/07/19 0333 06/08/19 0518 06/09/19 0810 06/10/19 0250  NA 134* 140 139 141 136 138 136  K 4.5 4.3 3.8 4.2 3.4* 3.8 3.7  CL 94* 98  --  100 95* 94* 96*  CO2 22 26  --  27 27 30 27   GLUCOSE 317* 118*  --  95 88 99 69*  BUN 25* 18  --  24* 33* 66* 60*  CREATININE 1.24* 0.85  --  0.80 1.38* 1.62* 1.17*  CALCIUM 8.0* 8.1*  --  8.2* 8.3* 8.4* 8.9  MG 2.4 2.0  --  1.9 1.9 2.2 2.3  PHOS 3.2  --   --  2.2* 3.5  --   --    GFR: Estimated Creatinine Clearance: 59.7 mL/min (A) (by C-G formula based on SCr of 1.17 mg/dL (H)). Liver Function Tests: Recent Labs  Lab 06/04/19 1918 06/05/19 0700 06/06/19 0606 06/10/19 0250  AST 30 30 36 27  ALT 32 29 25 26   ALKPHOS 57 67 117 79  BILITOT  0.4 0.6 0.6 0.5  PROT 7.6 7.1 7.1 7.5  ALBUMIN 2.9* 2.7* 2.6* 2.8*   No results for input(s): LIPASE, AMYLASE in the last 168 hours. No results for input(s): AMMONIA in the last 168 hours. Coagulation Profile: No results for input(s): INR, PROTIME in the last 168 hours. Cardiac Enzymes: No results for input(s): CKTOTAL, CKMB, CKMBINDEX, TROPONINI in the last 168 hours. BNP (last 3 results) No results for input(s): PROBNP in the last 8760 hours. HbA1C: No results for input(s): HGBA1C in the last 72 hours. CBG: Recent Labs  Lab 06/09/19 1927  06/09/19 2334 06/10/19 0226 06/10/19 0251 06/10/19 0700  GLUCAP 295* 101* 65* 73 78   Lipid Profile: No results for input(s): CHOL, HDL, LDLCALC, TRIG, CHOLHDL, LDLDIRECT in the last 72 hours. Thyroid Function Tests: No results for input(s): TSH, T4TOTAL, FREET4, T3FREE, THYROIDAB in the last 72 hours. Anemia Panel: No results for input(s): VITAMINB12, FOLATE, FERRITIN, TIBC, IRON, RETICCTPCT in the last 72 hours. Urine analysis:    Component Value Date/Time   COLORURINE YELLOW 06/05/2019 0052   APPEARANCEUR CLEAR 06/05/2019 0052   LABSPEC 1.025 06/05/2019 0052   PHURINE 5.0 06/05/2019 0052   GLUCOSEU >=500 (A) 06/05/2019 0052   HGBUR NEGATIVE 06/05/2019 0052   BILIRUBINUR NEGATIVE 06/05/2019 0052   BILIRUBINUR Negative 05/18/2019 1704   KETONESUR 5 (A) 06/05/2019 0052   PROTEINUR 100 (A) 06/05/2019 0052   UROBILINOGEN 0.2 05/18/2019 1704   NITRITE NEGATIVE 06/05/2019 0052   LEUKOCYTESUR MODERATE (A) 06/05/2019 0052   Sepsis Labs: @LABRCNTIP (procalcitonin:4,lacticidven:4)  ) Recent Results (from the past 240 hour(s))  Culture, Urine     Status: Abnormal   Collection Time: 06/05/19 12:30 AM   Specimen: Urine, Random  Result Value Ref Range Status   Specimen Description URINE, RANDOM  Final   Special Requests NONE  Final   Culture (A)  Final    <10,000 COLONIES/mL INSIGNIFICANT GROWTH Performed at [facility],  [address], [place], [place] [address]    Report Status 06/06/2019 FINAL  Final  Culture, blood (routine x 2)     Status: None (Preliminary result)   Collection Time: 06/05/19 12:57 AM   Specimen: BLOOD RIGHT HAND  Result Value Ref Range Status   Specimen Description BLOOD RIGHT HAND  Final   Special Requests   Final    BOTTLES DRAWN AEROBIC AND ANAEROBIC Blood Culture adequate volume   Culture   Final    NO GROWTH 4 DAYS Performed at [facility], [place] [address] [place], [place] [address]    Report Status PENDING  Incomplete  Culture, blood (routine x 2)     Status: None (Preliminary result)   Collection Time: 06/05/19  1:06 AM   Specimen: BLOOD LEFT FOREARM  Result Value Ref Range Status   Specimen Description BLOOD LEFT FOREARM  Final   Special Requests   Final    BOTTLES DRAWN AEROBIC AND ANAEROBIC Blood Culture adequate volume   Culture   Final    NO GROWTH 4 DAYS Performed at [facility], [place] [address] [place], [place] [address]    Report Status PENDING  Incomplete  MRSA PCR Screening     Status: None   Collection Time: 06/05/19  7:03 PM   Specimen: Nasal Mucosa; Nasopharyngeal  Result Value Ref Range Status   MRSA by PCR NEGATIVE NEGATIVE Final    Comment:        The GeneXpert MRSA Assay (FDA approved for NASAL specimens only), is one component of a comprehensive MRSA colonization surveillance program. It is not intended to diagnose MRSA infection nor to guide or monitor treatment for MRSA infections. Performed at [facility], [place] [address] [place], [place] [address]          Radiology Studies: No results found.      Scheduled Meds: . aspirin EC  81 mg Oral Daily  . Chlorhexidine Gluconate Cloth  6 each Topical Daily  . cholecalciferol  1,000 Units Oral Daily  . dexamethasone (DECADRON) injection  6 mg Intravenous Q24H  . famotidine  20 mg Oral Daily  . insulin aspart  0-20 Units Subcutaneous Q4H  . insulin detemir  7  Units Subcutaneous BID  . levothyroxine  224 mcg Oral QAC breakfast  . mouth rinse  15 mL Mouth Rinse BID  . pneumococcal 23 valent vaccine  0.5 mL Intramuscular Tomorrow-1000  . sodium chloride flush  10-40 mL Intracatheter Q12H  . sodium chloride flush  3 mL Intravenous Q12H  . traZODone  100 mg Oral QHS  . Vilazodone HCl  40 mg Oral QHS  . vitamin C  250 mg Oral Daily  . zinc sulfate  220 mg Oral Daily   Continuous Infusions: . sodium chloride Stopped (06/09/19 1104)  . heparin 1,600 Units/hr (06/10/19 0525)     LOS: 6 days   The patient is critically ill with multiple organ systems failure and requires high complexity decision making for assessment and support, frequent evaluation and titration of therapies, application of advanced monitoring technologies and extensive interpretation of multiple databases. Critical Care Time devoted to patient care services described in this note  Time spent: 40 minutes     WOODS, Geraldo Docker, MD [facility] Pager [phone]  If 7PM-7AM, please contact night-coverage [email or web address] Password [facility] 06/10/2019, 7:55 AM

## 2019-06-10 NOTE — Progress Notes (Signed)
ANTICOAGULATION CONSULT NOTE  Pharmacy Consult:  Heparin Indication: bilateral lower extremity DVT  Heparin Dosing Weight: 79.8 kg  Labs: Recent Labs    06/08/19 0518  06/09/19 0810 06/09/19 1828 06/10/19 0250 06/10/19 1110  HGB 13.0  --  12.2  --  12.1  --   HCT 42.1  --  40.2  --  39.5  --   PLT 358  --  423*  --  470*  --   HEPARINUNFRC 1.04*   < > 0.70 0.57 0.70 0.53  CREATININE 1.38*  --  1.62*  --  1.17*  --    < > = values in this interval not displayed.    Assessment: 55 YOF presenting with SOB, COVID-19+ from 05/27/19.  Pharmacy consulted to dose heparin drip for DVT, 11/21 dopplers show acute DVT of multiple veins within the right and left lower extremities   Currently on IV heparin at 1600 units/hr. HL is therapeutic at 0.53. H/H wnl. Plt 470k  Goal of Therapy:  Heparin level 0.3-0.7 units/ml Monitor platelets by anticoagulation protocol: Yes   Plan:  Continue heparin infusion at 1600 units/hr Monitor daily HL, CBC and s/s of bleeding  Follow up to transition to oral anticoagulation   Albertina Parr, PharmD., BCPS Clinical Pharmacist Clinical phone for 06/10/19 until 5pm: [phone]

## 2019-06-10 NOTE — Progress Notes (Signed)
Cash and Valuables documented in flowsheets verified by myself and Lysbeth Galas RN sent to security to be locked up. Receipt placed in patients chart.

## 2019-06-10 NOTE — Plan of Care (Signed)
Teaching continued with patient, all questions answered at this time. VSS, plan for possible tx out of ICU today. Remains on 6L HFNC, weaning as tolerated. Encouraging OOB/ambulation in room. Carb modified diet, tolerating well. Emotional support provided. Vdg on BSC. +BS, LBM yesterday. No c/o pain at this time. Safe environment of care maintained. Skin intact. WCTM.

## 2019-06-11 DIAGNOSIS — I82461 Acute embolism and thrombosis of right calf muscular vein: Secondary | ICD-10-CM | POA: Diagnosis present

## 2019-06-11 DIAGNOSIS — I82462 Acute embolism and thrombosis of left calf muscular vein: Secondary | ICD-10-CM | POA: Diagnosis present

## 2019-06-11 DIAGNOSIS — G4733 Obstructive sleep apnea (adult) (pediatric): Secondary | ICD-10-CM | POA: Diagnosis present

## 2019-06-11 DIAGNOSIS — N182 Chronic kidney disease, stage 2 (mild): Secondary | ICD-10-CM | POA: Diagnosis present

## 2019-06-11 DIAGNOSIS — E662 Morbid (severe) obesity with alveolar hypoventilation: Secondary | ICD-10-CM | POA: Diagnosis present

## 2019-06-11 DIAGNOSIS — E118 Type 2 diabetes mellitus with unspecified complications: Secondary | ICD-10-CM

## 2019-06-11 DIAGNOSIS — E1165 Type 2 diabetes mellitus with hyperglycemia: Secondary | ICD-10-CM | POA: Diagnosis present

## 2019-06-11 DIAGNOSIS — J9601 Acute respiratory failure with hypoxia: Secondary | ICD-10-CM | POA: Diagnosis present

## 2019-06-11 DIAGNOSIS — IMO0002 Reserved for concepts with insufficient information to code with codable children: Secondary | ICD-10-CM | POA: Diagnosis present

## 2019-06-11 DIAGNOSIS — I10 Essential (primary) hypertension: Secondary | ICD-10-CM | POA: Diagnosis present

## 2019-06-11 LAB — CBC WITH DIFFERENTIAL/PLATELET
Abs Immature Granulocytes: 0.7 10*3/uL — ABNORMAL HIGH (ref 0.00–0.07)
Basophils Absolute: 0.1 10*3/uL (ref 0.0–0.1)
Basophils Relative: 1 %
Eosinophils Absolute: 0.4 10*3/uL (ref 0.0–0.5)
Eosinophils Relative: 3 %
HCT: 35.8 % — ABNORMAL LOW (ref 36.0–46.0)
Hemoglobin: 11.1 g/dL — ABNORMAL LOW (ref 12.0–15.0)
Immature Granulocytes: 5 %
Lymphocytes Relative: 14 %
Lymphs Abs: 2.1 10*3/uL (ref 0.7–4.0)
MCH: 22.2 pg — ABNORMAL LOW (ref 26.0–34.0)
MCHC: 31 g/dL (ref 30.0–36.0)
MCV: 71.6 fL — ABNORMAL LOW (ref 80.0–100.0)
Monocytes Absolute: 1.7 10*3/uL — ABNORMAL HIGH (ref 0.1–1.0)
Monocytes Relative: 12 %
Neutro Abs: 9.4 10*3/uL — ABNORMAL HIGH (ref 1.7–7.7)
Neutrophils Relative %: 65 %
Platelets: 482 10*3/uL — ABNORMAL HIGH (ref 150–400)
RBC: 5 MIL/uL (ref 3.87–5.11)
RDW: 17.1 % — ABNORMAL HIGH (ref 11.5–15.5)
WBC: 14.4 10*3/uL — ABNORMAL HIGH (ref 4.0–10.5)
nRBC: 0 % (ref 0.0–0.2)

## 2019-06-11 LAB — D-DIMER, QUANTITATIVE: D-Dimer, Quant: 3.31 ug/mL-FEU — ABNORMAL HIGH (ref 0.00–0.50)

## 2019-06-11 LAB — COMPREHENSIVE METABOLIC PANEL
ALT: 25 U/L (ref 0–44)
AST: 22 U/L (ref 15–41)
Albumin: 2.5 g/dL — ABNORMAL LOW (ref 3.5–5.0)
Alkaline Phosphatase: 76 U/L (ref 38–126)
Anion gap: 12 (ref 5–15)
BUN: 46 mg/dL — ABNORMAL HIGH (ref 6–20)
CO2: 27 mmol/L (ref 22–32)
Calcium: 9.1 mg/dL (ref 8.9–10.3)
Chloride: 99 mmol/L (ref 98–111)
Creatinine, Ser: 1.16 mg/dL — ABNORMAL HIGH (ref 0.44–1.00)
GFR calc Af Amer: 60 mL/min — ABNORMAL LOW (ref >60)
GFR calc non Af Amer: 51 mL/min — ABNORMAL LOW (ref >60)
Glucose, Bld: 87 mg/dL (ref 70–99)
Potassium: 3.4 mmol/L — ABNORMAL LOW (ref 3.5–5.1)
Sodium: 138 mmol/L (ref 135–145)
Total Bilirubin: 0.3 mg/dL (ref 0.3–1.2)
Total Protein: 6.7 g/dL (ref 6.5–8.1)

## 2019-06-11 LAB — BRAIN NATRIURETIC PEPTIDE: B Natriuretic Peptide: 22.1 pg/mL (ref 0.0–100.0)

## 2019-06-11 LAB — GLUCOSE, CAPILLARY
Glucose-Capillary: 95 mg/dL (ref 70–99)
Glucose-Capillary: 124 mg/dL — ABNORMAL HIGH (ref 70–99)
Glucose-Capillary: 174 mg/dL — ABNORMAL HIGH (ref 70–99)
Glucose-Capillary: 169 mg/dL — ABNORMAL HIGH (ref 70–99)
Glucose-Capillary: 191 mg/dL — ABNORMAL HIGH (ref 70–99)
Glucose-Capillary: 231 mg/dL — ABNORMAL HIGH (ref 70–99)

## 2019-06-11 LAB — MAGNESIUM: Magnesium: 1.8 mg/dL (ref 1.7–2.4)

## 2019-06-11 LAB — C-REACTIVE PROTEIN: CRP: 4.1 mg/dL — ABNORMAL HIGH (ref <1.0)

## 2019-06-11 LAB — HEPARIN LEVEL (UNFRACTIONATED): Heparin Unfractionated: 0.67 IU/mL (ref 0.30–0.70)

## 2019-06-11 LAB — PROCALCITONIN: Procalcitonin: 0.26 ng/mL

## 2019-06-11 MED ORDER — AMPHETAMINE-DEXTROAMPHET ER 30 MG PO CP24: 50.0000 mg | ORAL_CAPSULE | Freq: Every day | ORAL | Status: DC

## 2019-06-11 MED ORDER — APIXABAN 5 MG PO TABS: 5.0000 mg | ORAL_TABLET | Freq: Two times a day (BID) | ORAL | Status: DC

## 2019-06-11 MED ORDER — APIXABAN 5 MG PO TABS
10.0000 mg | ORAL_TABLET | Freq: Two times a day (BID) | ORAL | Status: DC
  Administered 2019-06-11 – 2019-06-16 (×11): 10 mg via ORAL
  Filled 2019-06-11 (×11): qty 2

## 2019-06-11 MED ORDER — AMPHETAMINE-DEXTROAMPHET ER 10 MG PO CP24
50.0000 mg | ORAL_CAPSULE | Freq: Every day | ORAL | Status: DC
  Administered 2019-06-12: 50 mg via ORAL
  Filled 2019-06-11: qty 5

## 2019-06-11 MED ORDER — MAGNESIUM OXIDE 400 (241.3 MG) MG PO TABS
400.0000 mg | ORAL_TABLET | Freq: Once | ORAL | Status: AC
  Administered 2019-06-11: 400 mg via ORAL
  Filled 2019-06-11: qty 1

## 2019-06-11 MED ORDER — SITAGLIPTIN PHOS-METFORMIN HCL 50-1000 MG PO TABS: 1.0000 | ORAL_TABLET | Freq: Two times a day (BID) | ORAL | Status: DC

## 2019-06-11 MED ORDER — LINAGLIPTIN 5 MG PO TABS
5.0000 mg | ORAL_TABLET | Freq: Every day | ORAL | Status: DC
  Administered 2019-06-12 – 2019-06-16 (×5): 5 mg via ORAL
  Filled 2019-06-11 (×5): qty 1

## 2019-06-11 MED ORDER — AMPHETAMINE-DEXTROAMPHET ER 20 MG PO CP24: 20.0000 mg | ORAL_CAPSULE | Freq: Every day | ORAL | Status: DC

## 2019-06-11 MED ORDER — METFORMIN HCL 500 MG PO TABS
500.0000 mg | ORAL_TABLET | Freq: Two times a day (BID) | ORAL | Status: DC
  Administered 2019-06-11 – 2019-06-16 (×10): 500 mg via ORAL
  Filled 2019-06-11 (×11): qty 1

## 2019-06-11 MED ORDER — POTASSIUM CHLORIDE CRYS ER 20 MEQ PO TBCR
50.0000 meq | EXTENDED_RELEASE_TABLET | Freq: Once | ORAL | Status: AC
  Administered 2019-06-11: 50 meq via ORAL
  Filled 2019-06-11: qty 3

## 2019-06-11 MED ORDER — FAMOTIDINE 20 MG PO TABS
20.0000 mg | ORAL_TABLET | Freq: Two times a day (BID) | ORAL | Status: DC
  Administered 2019-06-12 – 2019-06-16 (×9): 20 mg via ORAL
  Filled 2019-06-11 (×9): qty 1

## 2019-06-11 NOTE — Progress Notes (Signed)
Late Entry for 06/10/19 @ 2010: Patient seen and assessed. Patient founds resting in bed watching PM television. No acute distress noted. Physical assessment completed via computerized charting per [facility] policy. New bag of Heparin spiked and infusion continues at 16 ml/hr via infusion pump (DVT). Side rails up x 2. Bed in lowest position and locked. Call light in reach.

## 2019-06-11 NOTE — Progress Notes (Signed)
Physical Therapy Treatment Patient Details Name: Brittany Rubio MRN: [id number] DOB: [birth year] Today's Date: 06/11/2019    History of Present Illness Pt is a 58 y/o female with PMHx including DM, HTN, anxiety, depression, breast CA, anemia admitted with severe hypoxic respiratory failure due to covid 19. Pt initially admitted to ICU for heated high flow, found to have DVT during admission and started on heparin.     PT Comments    Pt seems to be doing much better with mobility this am, was noted to be able to get from bed to [facility] on her own and complete clean up with mod I. Pt was able to ambulate approx 210ft with HHA and min guard assist on room air, she was noted to be stumbling some with several loses of balance at start of ambulation. Initially pt 02 sats reading in 70-80s with ambulation, this was monitored via pedi finger probe. At completion of initial ambulation distance therapist changed probe form pedi finger probe to Nelcor finger probe, sats (still on room air) now reading in high 80s to 90s. At end of session pt sitting EOB and sats reading 96%    Follow Up Recommendations  No PT follow up;Home health PT     Equipment Recommendations  None recommended by PT    Recommendations for Other Services       Precautions / Restrictions Precautions Precautions: Fall Precaution Comments: monitor sats Restrictions Weight Bearing Restrictions: No    Mobility  Bed Mobility               General bed mobility comments: pt recieved sitting on BSC  Transfers Overall transfer level: Needs assistance Equipment used: None Transfers: Sit to/from Stand;Stand Pivot Transfers Sit to Stand: Supervision;Modified independent (Device/Increase time) Stand pivot transfers: Supervision;Modified independent (Device/Increase time)          Ambulation/Gait Ambulation/Gait assistance: Min guard Gait Distance (Feet): 200 Feet Assistive device: 1 person hand held assist Gait  Pattern/deviations: Staggering left;Staggering right Gait velocity: decreased   General Gait Details: pt stumbles some at start of ambulation, with HHA able to complete distance   Stairs             Wheelchair Mobility    Modified Rankin (Stroke Patients Only)       Balance Overall balance assessment: Needs assistance Sitting-balance support: Feet unsupported Sitting balance-Leahy Scale: Good     Standing balance support: During functional activity Standing balance-Leahy Scale: Poor                              Cognition Arousal/Alertness: Awake/alert Behavior During Therapy: WFL for tasks assessed/performed Overall Cognitive Status: Within Functional Limits for tasks assessed                                 General Comments: pt seems to be at baseline but noted some moments of what seems like confusion walking around in circles      Exercises      General Comments        Pertinent Vitals/Pain Pain Assessment: No/denies pain    Home Living                      Prior Function            PT Goals (current goals can now be found in the care plan section)  Acute Rehab PT Goals Time For Goal Achievement: 06/23/19 Potential to Achieve Goals: Good Progress towards PT goals: Progressing toward goals    Frequency    Min 3X/week      PT Plan Current plan remains appropriate    Co-evaluation              AM-PAC PT "6 Clicks" Mobility   Outcome Measure  Help needed turning from your back to your side while in a flat bed without using bedrails?: None Help needed moving from lying on your back to sitting on the side of a flat bed without using bedrails?: None Help needed moving to and from a bed to a chair (including a wheelchair)?: None Help needed standing up from a chair using your arms (e.g., wheelchair or bedside chair)?: None Help needed to walk in hospital room?: A Little Help needed climbing 3-5 steps  with a railing? : A Lot 6 Click Score: 21    End of Session   Activity Tolerance: Patient tolerated treatment well Patient left: in bed;with call bell/phone within reach Nurse Communication: Mobility status PT Visit Diagnosis: Unsteadiness on feet (R26.81);Difficulty in walking, not elsewhere classified (R26.2)     Time: [id number] PT Time Calculation (min) (ACUTE ONLY): 30 min  Charges:  $Gait Training: 8-22 mins $Therapeutic Activity: 8-22 mins                     Horald Chestnut, PT    Delford Field 06/11/2019, 12:46 PM

## 2019-06-11 NOTE — Progress Notes (Signed)
ANTICOAGULATION CONSULT NOTE  Pharmacy Consult:  Heparin>apixaban Indication: bilateral lower extremity DVT  Heparin Dosing Weight: 79.8 kg  Labs: Recent Labs    06/09/19 0810  06/10/19 0250 06/10/19 1110 06/11/19 0522 06/11/19 0555  HGB 12.2  --  12.1  --   --  11.1*  HCT 40.2  --  39.5  --   --  35.8*  PLT 423*  --  470*  --   --  482*  HEPARINUNFRC 0.70   < > 0.70 0.53 0.67  --   CREATININE 1.62*  --  1.17*  --   --  1.16*   < > = values in this interval not displayed.    Assessment: 28 YOF presenting with SOB, COVID-19+ from 05/27/19.  Pharmacy consulted to dose heparin drip for DVT, 11/21 dopplers show acute DVT of multiple veins within the right and left lower extremities   Her heparin is therapeutic again today. D/w Dr Sherral Hammers about transitioning to apixaban and he agreed with the plan.   Goal of Therapy:  Heparin level 0.3-0.7 units/ml Monitor platelets by anticoagulation protocol: Yes   Plan:  Dc heparin Apixaban 10mg  PO BID x7d then 5mg  PO BID F/u with bleeding  Onnie Boer, PharmD, BCIDP, AAHIVP, CPP Infectious Disease Pharmacist 06/11/2019 10:57 AM

## 2019-06-11 NOTE — TOC Progression Note (Addendum)
Transition of Care [facility]) - Progression Note    Patient Details  Name: Brittany Rubio MRN: [id number] Date of Birth: [birth year]  [facility] of [facility] CM/SW Contact  Loletha Grayer Beverely Pace, RN Phone Number: 06/11/2019, 10:08 AM  Clinical Narrative:  Patient is a 58 year old lady admitted  history of worsening dyspnea.  Found to have COVID-19 pneumonia.  Requiring ICU admission for high oxygen requirements  Patient was transferred to [facility] 06/08/19.Now on RA.  Lives Home with husband who has been ill as well. Case manager following for needs.          Expected Discharge Plan and Services                                                 Social Determinants of Health (SDOH) Interventions    Readmission Risk Interventions No flowsheet data found.

## 2019-06-11 NOTE — Progress Notes (Signed)
SATURATION QUALIFICATIONS: (This note is used to comply with regulatory documentation for home oxygen)  Patient Saturations on Room Air at Rest = 88-92%  Patient Saturations on Room Air while Ambulating =86-90 %  Patient Saturations on 2 Liters of oxygen while Ambulating = 96%  Please briefly explain why patient needs home oxygen: Patient has shortness of breathe during ambulation and while performing ADLs. She takes 3-5 minutes to recover to baseline 02 of 88-92% on room air after activity.

## 2019-06-11 NOTE — Progress Notes (Addendum)
PROGRESS NOTE    Brittany Rubio  [id number] DOB: 10/03/1960 DOA: 06/04/2019 PCP: Glendale Chard, MD   Brief Narrative:   58 yo BF PMHx ADD, Anxiety, Depression, HTN, HLD, OSA on CPAP, Thyroid CA S/P XRT, DM Type 2 uncontrolled with complication  Developed a non productive cough, subjective fever, chills, myalgias 10 days prior to admission. She then developed progressive dyspnea after testing positive for COVID 19 on 05/27/19.  She developed severe hypoxic respiratory failure and was kept in ICU on heated high flow, was found to have DVT during this admission and started on heparin complicated by mild nosebleed.  Hypoxia has improved she still on heated high flow and transferred to my service on 06/09/2019.   Subjective: Afebrile last 24 hours, A/O x4, negative CP, negative N/V/D, positive S OB.  Negative smoker.  States has been sick also in hospital.  Believes contracted Covid from husband who works in hospital.   Assessment & Plan:   Principal Problem:   Pneumonia due to COVID-19 virus Active Problems:   Hypertension   AKI (acute kidney injury) ([place])   Lobar pneumonia ([place])   Type 2 diabetes mellitus with microalbuminuria, without long-term current use of insulin (HCC)   Acute on chronic respiratory failure with hypoxia (HCC)   Cancer of thyroid gland (HCC)   Severe obstructive sleep apnea-hypopnea syndrome   Severe sepsis (HCC)   COVID-19 virus infection   ADD (attention deficit disorder)   Depression   Anxiety   Acute respiratory failure with hypoxia (HCC)   Acute deep vein thrombosis (DVT) of calf muscle vein of left lower extremity (HCC)   Acute deep vein thrombosis (DVT) of calf muscle vein of right lower extremity (HCC)   OSA (obstructive sleep apnea)   Obesity hypoventilation syndrome ([place])   Essential hypertension   Diabetes mellitus type 2, uncontrolled, with complications ([place])   Covid pneumonia/acute on chronic respiratory failure with  hypoxia COVID-19 Labs  Recent Labs    06/09/19 0810 06/10/19 0250 06/11/19 0522  DDIMER 8.50* 5.86* 3.31*  CRP 12.1* 7.4* 4.1*    Lab Results  Component Value Date   SARSCOV2NAA Detected (A) 05/27/2019  -Albuterol -Decadron 6 mg daily -Completed course of Remdesivir -Vitamins per Covid protocol -SATURATION QUALIFICATIONS: (This note is used to comply with regulatory documentation for home oxygen) Patient Saturations on Room Air at Rest = 88-92% Patient Saturations on Room Air while Ambulating =86-90 % Patient Saturations on 2 Liters of oxygen while Ambulating = 96% Please briefly explain why patient needs home oxygen: Patient has shortness of breathe during ambulation and while performing ADLs. She takes 3-5 minutes to recover to baseline 02 of 88-92% on room air after activity -Patient qualifies for home O2 -2 L O2 via [place] at all times.  Titrate to maintain SPO2> 88% -Provide [employer] portable home O2 concentrator "  Acute bilateral lower extremity DVT -Heparin per pharmacy -11/26 DC heparin start apixaban  Severe OSA/OHS syndrome -Patient will need to have PCP refer to pulmonology for complete spirometry, and sleep study. -Patient most likely requires CPAP/BiPAP  ADD/depression/anxiety -Adderall 20 mg daily -We will hold benzodiazepine for her anxiety given her respiratory issues.  Essential HTN -BP controlled without medication.  AKI Recent Labs  Lab 06/07/19 0333 06/08/19 0518 06/09/19 0810 06/10/19 0250 06/11/19 0555  CREATININE 0.80 1.38* 1.62* 1.17* 1.16*  -Improving but not yet back to baseline  Diabetes type 2 uncontrolled with complication -[id number] hemoglobin A1c= 8.7 -11/26 Janumet 50-1000 mg BID  Hypokalemia -Potassium  goal> 4 -K-Dur 50 mEq  Hypomagnesmia -Magnesium goal> 2 -Mag-Ox 400 mg x 1      DVT prophylaxis: Heparin per pharmacy Code Status: Full Family Communication: 11/26 husband is in hospital for Covid infection, was going to  discharge patient in a.m. we will have to reevaluate to determine if she has care at home. Disposition Plan: TBD   Consultants:    Procedures/Significant Events:  Echo 11/20 >> EF 60 to 65%, mod TR, mod elevation in PASP  Leg [place] -   Right: Findings consistent with acute deep vein thrombosis involving the right posterior tibial veins, right soleal veins, and right gastrocnemius veins.   Left: Findings consistent with acute deep vein thrombosis involving the left posterior tibial veins, and left peroneal veins.   I have personally reviewed and interpreted all radiology studies and my findings are as above.  VENTILATOR SETTINGS: HFNC Flow; 6 L/min     Cultures   Antimicrobials: Anti-infectives (From admission, onward)   Start     Stop   06/07/19 1000  azithromycin (ZITHROMAX) tablet 500 mg  Status:  Discontinued     06/08/19 0856   06/06/19 1000  remdesivir 100 mg in sodium chloride 0.9 % 250 mL IVPB     06/09/19 0955   06/05/19 1000  cefTRIAXone (ROCEPHIN) 2 g in sodium chloride 0.9 % 100 mL IVPB  Status:  Discontinued     06/08/19 0856   06/05/19 1000  azithromycin (ZITHROMAX) 500 mg in sodium chloride 0.9 % 250 mL IVPB  Status:  Discontinued     06/07/19 0830   06/05/19 0830  remdesivir 200 mg in sodium chloride 0.9 % 250 mL IVPB     06/05/19 1115       Devices    LINES / TUBES:     Continuous Infusions:  sodium chloride Stopped (06/09/19 1104)     Objective: Vitals:   06/11/19 0749 06/11/19 0836 06/11/19 1144 06/11/19 1533  BP: 103/64  115/65 120/71  Pulse: 97  94 74  Resp: 16  20 17   Temp: 99.5 F (37.5 C) 99.2 F (37.3 C) 98.1 F (36.7 C) 98.6 F (37 C)  TempSrc: Oral Oral Oral Oral  SpO2: 95%  94% 97%  Weight:      Height:        Intake/Output Summary (Last 24 hours) at 06/11/2019 1612 Last data filed at 06/11/2019 1200 Gross per 24 hour  Intake 1479.43 ml  Output 200 ml  Net 1279.43 ml   Filed Weights   06/09/19 0350 06/10/19  0500 06/11/19 0500  Weight: 97.8 kg 96.9 kg 97.4 kg   Physical Exam:  General: A/O x4, positive  acute respiratory distress Eyes: negative scleral hemorrhage, negative anisocoria, negative icterus ENT: Negative Runny nose, negative gingival bleeding, Neck:  Negative scars, masses, torticollis, lymphadenopathy, JVD Lungs: Tachypneic diffuse poor air movement, without wheezes or crackles Cardiovascular: Regular rate and rhythm without murmur gallop or rub normal S1 and S2 Abdomen: negative abdominal pain, nondistended, positive soft, bowel sounds, no rebound, no ascites, no appreciable mass Extremities: No significant cyanosis, clubbing, or edema bilateral lower extremities Skin: Negative rashes, lesions, ulcers Psychiatric:  Negative depression, negative anxiety, negative fatigue, negative mania  Central nervous system:  Cranial nerves II through XII intact, tongue/uvula midline, all extremities muscle strength 5/5, sensation intact throughout, negative dysarthria, negative expressive aphasia, negative receptive aphasia.  .     Data Reviewed: Care during the described time interval was provided by me .  I have  reviewed this patient's available data, including medical history, events of note, physical examination, and all test results as part of my evaluation.   CBC: Recent Labs  Lab 06/05/19 0700 06/06/19 0606  06/07/19 0333 06/08/19 0518 06/09/19 0810 06/10/19 0250 06/11/19 0555  WBC 11.2* 15.8*  --  14.3* 12.8* 15.7* 15.6* 14.4*  NEUTROABS 9.3* 12.7*  --   --   --   --  10.5* 9.4*  HGB 12.1 11.8*   < > 12.2 13.0 12.2 12.1 11.1*  HCT 38.7 38.5   < > 39.8 42.1 40.2 39.5 35.8*  MCV 71.5* 72.0*  --  72.5* 71.7* 72.0* 71.8* 71.6*  PLT 212 262  --  289 358 423* 470* 482*   < > = values in this interval not displayed.   Basic Metabolic Panel: Recent Labs  Lab 06/05/19 0700  06/07/19 0333 06/08/19 0518 06/09/19 0810 06/10/19 0250 06/11/19 0555  NA 134*   < > 141 136 138 136  138  K 4.5   < > 4.2 3.4* 3.8 3.7 3.4*  CL 94*   < > 100 95* 94* 96* 99  CO2 22   < > 27 27 30 27 27   GLUCOSE 317*   < > 95 88 99 69* 87  BUN 25*   < > 24* 33* 66* 60* 46*  CREATININE 1.24*   < > 0.80 1.38* 1.62* 1.17* 1.16*  CALCIUM 8.0*   < > 8.2* 8.3* 8.4* 8.9 9.1  MG 2.4   < > 1.9 1.9 2.2 2.3 1.8  PHOS 3.2  --  2.2* 3.5  --   --   --    < > = values in this interval not displayed.   GFR: Estimated Creatinine Clearance: 60.3 mL/min (A) (by C-G formula based on SCr of 1.16 mg/dL (H)). Liver Function Tests: Recent Labs  Lab 06/04/19 1918 06/05/19 0700 06/06/19 0606 06/10/19 0250 06/11/19 0555  AST 30 30 36 27 22  ALT 32 29 25 26 25   ALKPHOS 57 67 117 79 76  BILITOT 0.4 0.6 0.6 0.5 0.3  PROT 7.6 7.1 7.1 7.5 6.7  ALBUMIN 2.9* 2.7* 2.6* 2.8* 2.5*   No results for input(s): LIPASE, AMYLASE in the last 168 hours. No results for input(s): AMMONIA in the last 168 hours. Coagulation Profile: No results for input(s): INR, PROTIME in the last 168 hours. Cardiac Enzymes: No results for input(s): CKTOTAL, CKMB, CKMBINDEX, TROPONINI in the last 168 hours. BNP (last 3 results) No results for input(s): PROBNP in the last 8760 hours. HbA1C: No results for input(s): HGBA1C in the last 72 hours. CBG: Recent Labs  Lab 06/10/19 1955 06/10/19 2305 06/11/19 0439 06/11/19 0835 06/11/19 1141  GLUCAP 298* 126* 95 124* 174*   Lipid Profile: No results for input(s): CHOL, HDL, LDLCALC, TRIG, CHOLHDL, LDLDIRECT in the last 72 hours. Thyroid Function Tests: No results for input(s): TSH, T4TOTAL, FREET4, T3FREE, THYROIDAB in the last 72 hours. Anemia Panel: No results for input(s): VITAMINB12, FOLATE, FERRITIN, TIBC, IRON, RETICCTPCT in the last 72 hours. Urine analysis:    Component Value Date/Time   COLORURINE YELLOW 06/05/2019 [place] 06/05/2019 0052   LABSPEC 1.025 06/05/2019 0052   PHURINE 5.0 06/05/2019 0052   GLUCOSEU >=500 (A) 06/05/2019 0052   HGBUR  NEGATIVE 06/05/2019 0052   BILIRUBINUR NEGATIVE 06/05/2019 0052   BILIRUBINUR Negative 05/18/2019 1704   KETONESUR 5 (A) 06/05/2019 0052   PROTEINUR 100 (A) 06/05/2019 0052   UROBILINOGEN 0.2 05/18/2019  [place] 06/05/2019 0052   LEUKOCYTESUR MODERATE (A) 06/05/2019 0052   Sepsis Labs: @LABRCNTIP (procalcitonin:4,lacticidven:4)  ) Recent Results (from the past 240 hour(s))  Culture, Urine     Status: Abnormal   Collection Time: 06/05/19 12:30 AM   Specimen: Urine, Random  Result Value Ref Range Status   Specimen Description URINE, RANDOM  Final   Special Requests NONE  Final   Culture (A)  Final    <10,000 COLONIES/mL INSIGNIFICANT GROWTH Performed at [facility], [place] [address], [place], [place] [address]    Report Status 06/06/2019 FINAL  Final  Culture, blood (routine x 2)     Status: None   Collection Time: 06/05/19 12:57 AM   Specimen: BLOOD RIGHT HAND  Result Value Ref Range Status   Specimen Description BLOOD RIGHT HAND  Final   Special Requests   Final    BOTTLES DRAWN AEROBIC AND ANAEROBIC Blood Culture adequate volume   Culture   Final    NO GROWTH 5 DAYS Performed at [facility], [place] [address] [place], [place] [address]    Report Status 06/10/2019 FINAL  Final  Culture, blood (routine x 2)     Status: None   Collection Time: 06/05/19  1:06 AM   Specimen: BLOOD LEFT FOREARM  Result Value Ref Range Status   Specimen Description BLOOD LEFT FOREARM  Final   Special Requests   Final    BOTTLES DRAWN AEROBIC AND ANAEROBIC Blood Culture adequate volume   Culture   Final    NO GROWTH 5 DAYS Performed at [facility], [place] [address], [place], [place] [address]    Report Status 06/10/2019 FINAL  Final  MRSA PCR Screening     Status: None   Collection Time: 06/05/19  7:03 PM   Specimen: Nasal Mucosa; Nasopharyngeal  Result Value Ref Range Status   MRSA by PCR NEGATIVE NEGATIVE Final    Comment:        The GeneXpert MRSA Assay  (FDA approved for NASAL specimens only), is one component of a comprehensive MRSA colonization surveillance program. It is not intended to diagnose MRSA infection nor to guide or monitor treatment for MRSA infections. Performed at [facility], [place] [address], [place], [place] [address]          Radiology Studies: No results found.      Scheduled Meds:  apixaban  10 mg Oral BID   Followed by   Derrill Memo ON 06/18/2019] apixaban  5 mg Oral BID   aspirin EC  81 mg Oral Daily   Chlorhexidine Gluconate Cloth  6 each Topical Daily   cholecalciferol  1,000 Units Oral Daily   dexamethasone (DECADRON) injection  6 mg Intravenous Q24H   famotidine  20 mg Oral Daily   insulin aspart  0-20 Units Subcutaneous Q4H   insulin detemir  7 Units Subcutaneous BID   levothyroxine  224 mcg Oral QAC breakfast   mouth rinse  15 mL Mouth Rinse BID   pneumococcal 23 valent vaccine  0.5 mL Intramuscular Tomorrow-1000   sodium chloride flush  10-40 mL Intracatheter Q12H   sodium chloride flush  3 mL Intravenous Q12H   traZODone  100 mg Oral QHS   Vilazodone HCl  40 mg Oral QHS   vitamin C  250 mg Oral Daily   zinc sulfate  220 mg Oral Daily   Continuous Infusions:  sodium chloride Stopped (06/09/19 1104)     LOS:  7 days   The patient is critically ill with multiple organ systems failure and requires high complexity decision making for assessment and support, frequent evaluation and titration of therapies, application of advanced monitoring technologies and extensive interpretation of multiple databases. Critical Care Time devoted to patient care services described in this note  Time spent: 40 minutes     Timera Windt, Geraldo Docker, MD [facility] Pager [phone]  If 7PM-7AM, please contact night-coverage [email or web address] Password [email or web address] 06/11/2019, 4:12 PM

## 2019-06-12 ENCOUNTER — Encounter: Payer: Self-pay | Admitting: Internal Medicine

## 2019-06-12 LAB — CBC WITH DIFFERENTIAL/PLATELET
Abs Immature Granulocytes: 0.49 10*3/uL — ABNORMAL HIGH (ref 0.00–0.07)
Basophils Absolute: 0.1 10*3/uL (ref 0.0–0.1)
Basophils Relative: 1 %
Eosinophils Absolute: 0.3 10*3/uL (ref 0.0–0.5)
Eosinophils Relative: 3 %
HCT: 34.2 % — ABNORMAL LOW (ref 36.0–46.0)
Hemoglobin: 10.5 g/dL — ABNORMAL LOW (ref 12.0–15.0)
Immature Granulocytes: 5 %
Lymphocytes Relative: 16 %
Lymphs Abs: 1.8 10*3/uL (ref 0.7–4.0)
MCH: 22.2 pg — ABNORMAL LOW (ref 26.0–34.0)
MCHC: 30.7 g/dL (ref 30.0–36.0)
MCV: 72.5 fL — ABNORMAL LOW (ref 80.0–100.0)
Monocytes Absolute: 1.5 10*3/uL — ABNORMAL HIGH (ref 0.1–1.0)
Monocytes Relative: 13 %
Neutro Abs: 6.8 10*3/uL (ref 1.7–7.7)
Neutrophils Relative %: 62 %
Platelets: 449 10*3/uL — ABNORMAL HIGH (ref 150–400)
RBC: 4.72 MIL/uL (ref 3.87–5.11)
RDW: 17.2 % — ABNORMAL HIGH (ref 11.5–15.5)
WBC: 10.9 10*3/uL — ABNORMAL HIGH (ref 4.0–10.5)
nRBC: 0.2 % (ref 0.0–0.2)

## 2019-06-12 LAB — GLUCOSE, CAPILLARY
Glucose-Capillary: 171 mg/dL — ABNORMAL HIGH (ref 70–99)
Glucose-Capillary: 96 mg/dL (ref 70–99)
Glucose-Capillary: 98 mg/dL (ref 70–99)
Glucose-Capillary: 262 mg/dL — ABNORMAL HIGH (ref 70–99)
Glucose-Capillary: 263 mg/dL — ABNORMAL HIGH (ref 70–99)
Glucose-Capillary: 169 mg/dL — ABNORMAL HIGH (ref 70–99)

## 2019-06-12 LAB — COMPREHENSIVE METABOLIC PANEL
ALT: 20 U/L (ref 0–44)
AST: 15 U/L (ref 15–41)
Albumin: 2.4 g/dL — ABNORMAL LOW (ref 3.5–5.0)
Alkaline Phosphatase: 59 U/L (ref 38–126)
Anion gap: 8 (ref 5–15)
BUN: 38 mg/dL — ABNORMAL HIGH (ref 6–20)
CO2: 28 mmol/L (ref 22–32)
Calcium: 9 mg/dL (ref 8.9–10.3)
Chloride: 103 mmol/L (ref 98–111)
Creatinine, Ser: 1.04 mg/dL — ABNORMAL HIGH (ref 0.44–1.00)
GFR calc Af Amer: >60 mL/min (ref >60)
GFR calc non Af Amer: 59 mL/min — ABNORMAL LOW (ref >60)
Glucose, Bld: 92 mg/dL (ref 70–99)
Potassium: 4.2 mmol/L (ref 3.5–5.1)
Sodium: 139 mmol/L (ref 135–145)
Total Bilirubin: 0.4 mg/dL (ref 0.3–1.2)
Total Protein: 6.7 g/dL (ref 6.5–8.1)

## 2019-06-12 LAB — C-REACTIVE PROTEIN: CRP: 6.7 mg/dL — ABNORMAL HIGH (ref <1.0)

## 2019-06-12 LAB — MAGNESIUM: Magnesium: 2 mg/dL (ref 1.7–2.4)

## 2019-06-12 LAB — PHOSPHORUS: Phosphorus: 3.2 mg/dL (ref 2.5–4.6)

## 2019-06-12 LAB — D-DIMER, QUANTITATIVE: D-Dimer, Quant: 3.2 ug/mL-FEU — ABNORMAL HIGH (ref 0.00–0.50)

## 2019-06-12 LAB — BRAIN NATRIURETIC PEPTIDE: B Natriuretic Peptide: 34.1 pg/mL (ref 0.0–100.0)

## 2019-06-12 NOTE — Plan of Care (Signed)
  Problem: Education: Goal: Knowledge of General Education information will improve Description: Including pain rating scale, medication(s)/side effects and non-pharmacologic comfort measures Outcome: Progressing   Problem: Health Behavior/Discharge Planning: Goal: Ability to manage health-related needs will improve Outcome: Progressing   Problem: Clinical Measurements: Goal: Ability to maintain clinical measurements within normal limits will improve Outcome: Progressing Goal: Diagnostic test results will improve Outcome: Progressing Goal: Respiratory complications will improve Outcome: Progressing Goal: Cardiovascular complication will be avoided Outcome: Progressing   Problem: Activity: Goal: Risk for activity intolerance will decrease Outcome: Progressing   Problem: Nutrition: Goal: Adequate nutrition will be maintained Outcome: Progressing   Problem: Coping: Goal: Level of anxiety will decrease Outcome: Progressing   Problem: Elimination: Goal: Will not experience complications related to bowel motility Outcome: Progressing   Problem: Pain Managment: Goal: General experience of comfort will improve Outcome: Progressing   Problem: Safety: Goal: Ability to remain free from injury will improve Outcome: Progressing   Problem: Skin Integrity: Goal: Risk for impaired skin integrity will decrease Outcome: Progressing

## 2019-06-12 NOTE — Progress Notes (Signed)
Family Update  Patient visited husband whom is a patient at [facility] Rm 175. Updated spouse on POC and discharge planning. Patient started on po anticoagulant and heparin gtt d/c'd. Both parties verbalized comprehension of teaching.

## 2019-06-12 NOTE — Discharge Instructions (Signed)
Information on my medicine - ELIQUIS (apixaban)  This medication education was reviewed with me or my healthcare representative as part of my discharge preparation.  The pharmacist that spoke with me during my hospital stay was: Ulice Dash, PharmD, BCPS   Why was Eliquis prescribed for you? Eliquis was prescribed to treat blood clots that may have been found in the veins of your legs (deep vein thrombosis) or in your lungs (pulmonary embolism) and to reduce the risk of them occurring again.  What do You need to know about Eliquis ? The starting dose is 10 mg (two 5 mg tablets) taken TWICE daily for the FIRST SEVEN (7) DAYS, then on (enter date)  06/18/19  the dose is reduced to ONE 5 mg tablet taken TWICE daily.  Eliquis may be taken with or without food.   Try to take the dose about the same time in the morning and in the evening. If you have difficulty swallowing the tablet whole please discuss with your pharmacist how to take the medication safely.  Take Eliquis exactly as prescribed and DO NOT stop taking Eliquis without talking to the doctor who prescribed the medication.  Stopping may increase your risk of developing a new blood clot.  Refill your prescription before you run out.  After discharge, you should have regular check-up appointments with your healthcare provider that is prescribing your Eliquis.    What do you do if you miss a dose? If a dose of ELIQUIS is not taken at the scheduled time, take it as soon as possible on the same day and twice-daily administration should be resumed. The dose should not be doubled to make up for a missed dose.  Important Safety Information A possible side effect of Eliquis is bleeding. You should call your healthcare provider right away if you experience any of the following: ? Bleeding from an injury or your nose that does not stop. ? Unusual colored urine (red or dark brown) or unusual colored stools (red or black). ? Unusual  bruising for unknown reasons. ? A serious fall or if you hit your head (even if there is no bleeding).  Some medicines may interact with Eliquis and might increase your risk of bleeding or clotting while on Eliquis. To help avoid this, consult your healthcare provider or pharmacist prior to using any new prescription or non-prescription medications, including herbals, vitamins, non-steroidal anti-inflammatory drugs (NSAIDs) and supplements.  This website has more information on Eliquis (apixaban): http://www.eliquis.com/eliquis/home

## 2019-06-12 NOTE — Progress Notes (Signed)
Update spouse on patient condition. Informed him of possible d/c to rehab. Patient aware.

## 2019-06-12 NOTE — Progress Notes (Addendum)
PROGRESS NOTE    Brittany Rubio  [id number] DOB: 12/23/1960 DOA: 06/04/2019 PCP: Glendale Chard, MD   Brief Narrative:   58 yo BF PMHx ADD, Anxiety, Depression, HTN, HLD, OSA on CPAP, Thyroid CA S/P XRT, DM Type 2 uncontrolled with complication  Developed a non productive cough, subjective fever, chills, myalgias 10 days prior to admission. She then developed progressive dyspnea after testing positive for COVID 19 on 05/27/19.  She developed severe hypoxic respiratory failure and was kept in ICU on heated high flow, was found to have DVT during this admission and started on heparin complicated by mild nosebleed.  Hypoxia has improved she still on heated high flow and transferred to my service on 06/09/2019.   Subjective: 11/27 afebrile last 24 hours.  Negative CP, negative N/V, negative abdominal pain.  Positive SOB.  Positive fatigue/weakness.    Assessment & Plan:   Principal Problem:   Pneumonia due to COVID-19 virus Active Problems:   Hypertension   AKI (acute kidney injury) ([place])   Lobar pneumonia (HCC)   Type 2 diabetes mellitus with microalbuminuria, without long-term current use of insulin (HCC)   Acute on chronic respiratory failure with hypoxia (HCC)   Cancer of thyroid gland (HCC)   Severe obstructive sleep apnea-hypopnea syndrome   Severe sepsis (HCC)   COVID-19 virus infection   ADD (attention deficit disorder)   Depression   Anxiety   Acute respiratory failure with hypoxia (HCC)   Acute deep vein thrombosis (DVT) of calf muscle vein of left lower extremity (HCC)   Acute deep vein thrombosis (DVT) of calf muscle vein of right lower extremity (HCC)   OSA (obstructive sleep apnea)   Obesity hypoventilation syndrome ([place])   Essential hypertension   Diabetes mellitus type 2, uncontrolled, with complications ([place])   Covid pneumonia/acute on chronic respiratory failure with hypoxia COVID-19 Labs  Recent Labs    06/10/19 0250 06/11/19 0522 06/12/19  0605  DDIMER 5.86* 3.31* 3.20*  CRP 7.4* 4.1* 6.7*    Lab Results  Component Value Date   SARSCOV2NAA Detected (A) 05/27/2019  -Albuterol -Decadron 6 mg daily -Completed course of Remdesivir -Vitamins per Covid protocol -SATURATION QUALIFICATIONS: (This note is used to comply with regulatory documentation for home oxygen) Patient Saturations on Room Air at Rest = 88-92% Patient Saturations on Room Air while Ambulating =86-90 % Patient Saturations on 2 Liters of oxygen while Ambulating = 96% Please briefly explain why patient needs home oxygen: Patient has shortness of breathe during ambulation and while performing ADLs. She takes 3-5 minutes to recover to baseline 02 of 88-92% on room air after activity -Patient qualifies for home O2 -2 L O2 via [place] at all times.  Titrate to maintain SPO2> 88% -Provide [employer] portable home O2 concentrator "  Acute bilateral lower extremity DVT -Heparin per pharmacy -11/26 DC heparin start apixaban  Severe OSA/OHS syndrome -Patient will need to have PCP refer to pulmonology for complete spirometry, and sleep study. -Patient most likely requires CPAP/BiPAP  ADD/depression/anxiety -Adderall 20 mg daily -We will hold benzodiazepine for her anxiety given her respiratory issues.  Essential HTN -BP controlled without medication.  AKI Recent Labs  Lab 06/08/19 0518 06/09/19 0810 06/10/19 0250 06/11/19 0555 06/12/19 0605  CREATININE 1.38* 1.62* 1.17* 1.16* 1.04*  -Improving but not yet back to baseline  Diabetes type 2 uncontrolled with complication -[id number] hemoglobin A1c= 8.7 -11/26 Janumet 50-1000 mg BID  Hypokalemia -Potassium goal> 4 -K-Dur 50 mEq  Hypomagnesmia -Magnesium goal> 2 -Mag-Ox 400 mg x  1   Goals of care -11/27 PT/OT consult; patient unstable on feet, no one at home to care for her.  Yesterday afternoon SPO2 in 70s when Alvan fell off, patient only woke because of RN.  Evaluate for CIR vs SNF -Patient has agreed to CIR  vs SNF    DVT prophylaxis: Heparin per pharmacy Code Status: Full Family Communication: 11/26 husband is in hospital for Covid infection, was going to discharge patient in a.m. we will have to reevaluate to determine if she has care at home. Disposition Plan: TBD   Consultants:    Procedures/Significant Events:  Echo 11/20 >> EF 60 to 65%, mod TR, mod elevation in PASP  Leg [place] -   Right: Findings consistent with acute deep vein thrombosis involving the right posterior tibial veins, right soleal veins, and right gastrocnemius veins.   Left: Findings consistent with acute deep vein thrombosis involving the left posterior tibial veins, and left peroneal veins.   I have personally reviewed and interpreted all radiology studies and my findings are as above.  VENTILATOR SETTINGS: Nasal cannula 11/27 Flow; 3.5 L/min SPO2; 100%     Cultures   Antimicrobials: Anti-infectives (From admission, onward)   Start     Stop   06/07/19 1000  azithromycin (ZITHROMAX) tablet 500 mg  Status:  Discontinued     06/08/19 0856   06/06/19 1000  remdesivir 100 mg in sodium chloride 0.9 % 250 mL IVPB     06/09/19 0955   06/05/19 1000  cefTRIAXone (ROCEPHIN) 2 g in sodium chloride 0.9 % 100 mL IVPB  Status:  Discontinued     06/08/19 0856   06/05/19 1000  azithromycin (ZITHROMAX) 500 mg in sodium chloride 0.9 % 250 mL IVPB  Status:  Discontinued     06/07/19 0830   06/05/19 0830  remdesivir 200 mg in sodium chloride 0.9 % 250 mL IVPB     06/05/19 1115       Devices    LINES / TUBES:     Continuous Infusions: . sodium chloride Stopped (06/09/19 1104)     Objective: Vitals:   06/12/19 0120 06/12/19 0421 06/12/19 0732 06/12/19 1152  BP: (!) 137/92 125/71 124/71 122/68  Pulse: 79 74 78 92  Resp:  17 19 (!) 25  Temp: 98.8 F (37.1 C) 98.7 F (37.1 C) 99.2 F (37.3 C) 99.3 F (37.4 C)  TempSrc: Oral Oral Oral Oral  SpO2:  97% 97% 100%  Weight:      Height:         Intake/Output Summary (Last 24 hours) at 06/12/2019 1201 Last data filed at 06/12/2019 1041 Gross per 24 hour  Intake 123 ml  Output 900 ml  Net -777 ml   Filed Weights   06/09/19 0350 06/10/19 0500 06/11/19 0500  Weight: 97.8 kg 96.9 kg 97.4 kg   Physical Exam:  General: A/O x4, positive acute respiratory distress Eyes: negative scleral hemorrhage, negative anisocoria, negative icterus ENT: Negative Runny nose, negative gingival bleeding, Neck:  Negative scars, masses, torticollis, lymphadenopathy, JVD Lungs: Diffuse poor air movement without wheezes or crackles Cardiovascular: Regular rate and rhythm without murmur gallop or rub normal S1 and S2 Abdomen: MORBIDLY OBESE negative abdominal pain, nondistended, positive soft, bowel sounds, no rebound, no ascites, no appreciable mass Extremities: No significant cyanosis, clubbing, or edema bilateral lower extremities Skin: Negative rashes, lesions, ulcers Psychiatric:  Negative depression, negative anxiety, negative fatigue, negative mania  Central nervous system:  Cranial nerves II through XII intact, tongue/uvula  midline, all extremities muscle strength 5/5, sensation intact throughout,  negative dysarthria, negative expressive aphasia, negative receptive aphasia. .     Data Reviewed: Care during the described time interval was provided by me .  I have reviewed this patient's available data, including medical history, events of note, physical examination, and all test results as part of my evaluation.   CBC: Recent Labs  Lab 06/06/19 0606  06/08/19 0518 06/09/19 0810 06/10/19 0250 06/11/19 0555 06/12/19 0605  WBC 15.8*   < > 12.8* 15.7* 15.6* 14.4* 10.9*  NEUTROABS 12.7*  --   --   --  10.5* 9.4* 6.8  HGB 11.8*   < > 13.0 12.2 12.1 11.1* 10.5*  HCT 38.5   < > 42.1 40.2 39.5 35.8* 34.2*  MCV 72.0*   < > 71.7* 72.0* 71.8* 71.6* 72.5*  PLT 262   < > 358 423* 470* 482* 449*   < > = values in this interval not displayed.    Basic Metabolic Panel: Recent Labs  Lab 06/07/19 0333 06/08/19 0518 06/09/19 0810 06/10/19 0250 06/11/19 0555 06/12/19 0605  NA 141 136 138 136 138 139  K 4.2 3.4* 3.8 3.7 3.4* 4.2  CL 100 95* 94* 96* 99 103  CO2 27 27 30 27 27 28   GLUCOSE 95 88 99 69* 87 92  BUN 24* 33* 66* 60* 46* 38*  CREATININE 0.80 1.38* 1.62* 1.17* 1.16* 1.04*  CALCIUM 8.2* 8.3* 8.4* 8.9 9.1 9.0  MG 1.9 1.9 2.2 2.3 1.8 2.0  PHOS 2.2* 3.5  --   --   --  3.2   GFR: Estimated Creatinine Clearance: 67.3 mL/min (A) (by C-G formula based on SCr of 1.04 mg/dL (H)). Liver Function Tests: Recent Labs  Lab 06/06/19 0606 06/10/19 0250 06/11/19 0555 06/12/19 0605  AST 36 27 22 15   ALT 25 26 25 20   ALKPHOS 117 79 76 59  BILITOT 0.6 0.5 0.3 0.4  PROT 7.1 7.5 6.7 6.7  ALBUMIN 2.6* 2.8* 2.5* 2.4*   No results for input(s): LIPASE, AMYLASE in the last 168 hours. No results for input(s): AMMONIA in the last 168 hours. Coagulation Profile: No results for input(s): INR, PROTIME in the last 168 hours. Cardiac Enzymes: No results for input(s): CKTOTAL, CKMB, CKMBINDEX, TROPONINI in the last 168 hours. BNP (last 3 results) No results for input(s): PROBNP in the last 8760 hours. HbA1C: No results for input(s): HGBA1C in the last 72 hours. CBG: Recent Labs  Lab 06/11/19 2025 06/11/19 2310 06/12/19 0417 06/12/19 0838 06/12/19 1132  GLUCAP 191* 231* 96 98 262*   Lipid Profile: No results for input(s): CHOL, HDL, LDLCALC, TRIG, CHOLHDL, LDLDIRECT in the last 72 hours. Thyroid Function Tests: No results for input(s): TSH, T4TOTAL, FREET4, T3FREE, THYROIDAB in the last 72 hours. Anemia Panel: No results for input(s): VITAMINB12, FOLATE, FERRITIN, TIBC, IRON, RETICCTPCT in the last 72 hours. Urine analysis:    Component Value Date/Time   COLORURINE YELLOW 06/05/2019 0052   APPEARANCEUR CLEAR 06/05/2019 0052   LABSPEC 1.025 06/05/2019 0052   PHURINE 5.0 06/05/2019 0052   GLUCOSEU >=500 (A) 06/05/2019 0052    HGBUR NEGATIVE 06/05/2019 0052   BILIRUBINUR NEGATIVE 06/05/2019 0052   BILIRUBINUR Negative 05/18/2019 1704   KETONESUR 5 (A) 06/05/2019 0052   PROTEINUR 100 (A) 06/05/2019 0052   UROBILINOGEN 0.2 05/18/2019 1704   NITRITE NEGATIVE 06/05/2019 0052   LEUKOCYTESUR MODERATE (A) 06/05/2019 0052   Sepsis Labs: @LABRCNTIP (procalcitonin:4,lacticidven:4)  ) Recent Results (from the past 240  hour(s))  Culture, Urine     Status: Abnormal   Collection Time: 06/05/19 12:30 AM   Specimen: Urine, Random  Result Value Ref Range Status   Specimen Description URINE, RANDOM  Final   Special Requests NONE  Final   Culture (A)  Final    <10,000 COLONIES/mL INSIGNIFICANT GROWTH Performed at [facility], [address], [place], [place] [address]    Report Status 06/06/2019 FINAL  Final  Culture, blood (routine x 2)     Status: None   Collection Time: 06/05/19 12:57 AM   Specimen: BLOOD RIGHT HAND  Result Value Ref Range Status   Specimen Description BLOOD RIGHT HAND  Final   Special Requests   Final    BOTTLES DRAWN AEROBIC AND ANAEROBIC Blood Culture adequate volume   Culture   Final    NO GROWTH 5 DAYS Performed at [facility], [place] [address] [place], [place] [address]    Report Status 06/10/2019 FINAL  Final  Culture, blood (routine x 2)     Status: None   Collection Time: 06/05/19  1:06 AM   Specimen: BLOOD LEFT FOREARM  Result Value Ref Range Status   Specimen Description BLOOD LEFT FOREARM  Final   Special Requests   Final    BOTTLES DRAWN AEROBIC AND ANAEROBIC Blood Culture adequate volume   Culture   Final    NO GROWTH 5 DAYS Performed at [facility], [place] [address], [place], [place] [address]    Report Status 06/10/2019 FINAL  Final  MRSA PCR Screening     Status: None   Collection Time: 06/05/19  7:03 PM   Specimen: Nasal Mucosa; Nasopharyngeal  Result Value Ref Range Status   MRSA by PCR NEGATIVE NEGATIVE Final    Comment:        The GeneXpert  MRSA Assay (FDA approved for NASAL specimens only), is one component of a comprehensive MRSA colonization surveillance program. It is not intended to diagnose MRSA infection nor to guide or monitor treatment for MRSA infections. Performed at [facility], [place] [address] [place], [place] [address]          Radiology Studies: No results found.      Scheduled Meds: . amphetamine-dextroamphetamine  50 mg Oral Daily  . apixaban  10 mg Oral BID   Followed by  . [START ON 06/18/2019] apixaban  5 mg Oral BID  . aspirin EC  81 mg Oral Daily  . Chlorhexidine Gluconate Cloth  6 each Topical Daily  . cholecalciferol  1,000 Units Oral Daily  . dexamethasone (DECADRON) injection  6 mg Intravenous Q24H  . famotidine  20 mg Oral BID  . insulin aspart  0-20 Units Subcutaneous Q4H  . insulin detemir  7 Units Subcutaneous BID  . levothyroxine  224 mcg Oral QAC breakfast  . linagliptin  5 mg Oral Daily   And  . metFORMIN  500 mg Oral BID WC  . mouth rinse  15 mL Mouth Rinse BID  . pneumococcal 23 valent vaccine  0.5 mL Intramuscular Tomorrow-1000  . sodium chloride flush  10-40 mL Intracatheter Q12H  . sodium chloride flush  3 mL Intravenous Q12H  . traZODone  100 mg Oral QHS  . Vilazodone HCl  40 mg Oral QHS  . vitamin C  250 mg Oral Daily  . zinc sulfate  220 mg Oral Daily   Continuous Infusions: . sodium chloride Stopped (06/09/19 1104)  LOS: 8 days   The patient is critically ill with multiple organ systems failure and requires high complexity decision making for assessment and support, frequent evaluation and titration of therapies, application of advanced monitoring technologies and extensive interpretation of multiple databases. Critical Care Time devoted to patient care services described in this note  Time spent: 40 minutes     WOODS, Geraldo Docker, MD [facility] Pager [phone]  If 7PM-7AM, please contact night-coverage [email or web address] Password  [email or web address] 06/12/2019, 12:01 PM

## 2019-06-12 NOTE — Progress Notes (Signed)
Occupational Therapy Treatment Patient Details Name: Brittany Rubio MRN: [id number] DOB: [birth year] Today's Date: 06/12/2019    History of present illness Pt is a 58 y/o female with PMHx including DM, HTN, anxiety, depression, breast CA, anemia admitted with severe hypoxic respiratory failure due to covid 19. Pt initially admitted to ICU for heated high flow, found to have DVT during admission and started on heparin.    OT comments  Pt continues to demonstrate progress in regards to activity tolerance and independence with self-care and functional transfer tasks. Pt's O2 decreased to 3L [place] during session with O2 SATs maintaining in high 90s throughout. Updated RN on O2 status in order to continue weaning pt off. Pt able to ambulate to/from bathroom with supervision. Noted 0 instances of LOB. Pt tolerated standing at the sink ~3 min to complete grooming/hygiene tasks with O2 maintaining in 90s. 2/4 DOE. Pt reports anxiety related to SOB. Educated pt on relaxation strategies to combat anxiety. Educated pt on energy conservation techniques with handout provided. Pt demonstrated good recall of breathing exercises requiring min cues on technique. All questions/concerns answered at this time.    Follow Up Recommendations  Home health OT;Supervision/Assistance - 24 hour    Equipment Recommendations  None recommended by OT    Recommendations for Other Services      Precautions / Restrictions Precautions Precautions: Fall Restrictions Weight Bearing Restrictions: No       Mobility Bed Mobility                  Transfers Overall transfer level: Needs assistance Equipment used: None Transfers: Sit to/from Stand Sit to Stand: Supervision              Balance Overall balance assessment: No apparent balance deficits (not formally assessed)                                         ADL either performed or assessed with clinical judgement   ADL Overall ADL's  : Needs assistance/impaired Eating/Feeding: Modified independent;Sitting   Grooming: Supervision/safety;Standing                   Toilet Transfer: Supervision/safety;Ambulation   Toileting- Clothing Manipulation and Hygiene: Supervision/safety;Sit to/from stand       Functional mobility during ADLs: Supervision/safety;Min guard       Vision       Perception     Praxis      Cognition Arousal/Alertness: Awake/alert Behavior During Therapy: WFL for tasks assessed/performed Overall Cognitive Status: Within Functional Limits for tasks assessed                                          Exercises Exercises: Other exercises Other Exercises Other Exercises: Incentive spirometer x 10 with min cues on technique. Averaging 1212mL. Other Exercises: Flutter valve x 10 with min cues on technique.   Shoulder Instructions       General Comments Educated pt on safety strategies, activity pacing, and activity modifications for ADLs and transfers. Educated pt on energy conservation techniques with handout provided.     Pertinent Vitals/ Pain       Pain Assessment: No/denies pain  Home Living  Prior Functioning/Environment              Frequency  Min 3X/week        Progress Toward Goals  OT Goals(current goals can now be found in the care plan section)  Progress towards OT goals: Progressing toward goals  ADL Goals Pt Will Perform Grooming: with modified independence;standing Pt Will Perform Lower Body Bathing: with modified independence;sit to/from stand Pt Will Perform Lower Body Dressing: with modified independence;sit to/from stand Pt Will Transfer to Toilet: with modified independence;ambulating Pt Will Perform Toileting - Clothing Manipulation and hygiene: with modified independence;sit to/from stand Pt/caregiver will Perform Home Exercise Program: Increased strength;Both right  and left upper extremity;With theraband Additional ADL Goal #1: Pt will verbalize at least 3 energy conservation techniques to use during functional task.  Plan Discharge plan remains appropriate    Co-evaluation          OT goals addressed during session: ADL's and self-care      AM-PAC OT "6 Clicks" Daily Activity     Outcome Measure   Help from another person eating meals?: None Help from another person taking care of personal grooming?: A Little Help from another person toileting, which includes using toliet, bedpan, or urinal?: A Little Help from another person bathing (including washing, rinsing, drying)?: A Little Help from another person to put on and taking off regular upper body clothing?: None Help from another person to put on and taking off regular lower body clothing?: A Little 6 Click Score: 20    End of Session Equipment Utilized During Treatment: Oxygen  OT Visit Diagnosis: Unsteadiness on feet (R26.81);Muscle weakness (generalized) (M62.81)   Activity Tolerance Patient tolerated treatment well   Patient Left in chair;with call bell/phone within reach   Nurse Communication Mobility status        Time: [id number] OT Time Calculation (min): 39 min  Charges: OT General Charges $OT Visit: 1 Visit OT Treatments $Self Care/Home Management : 8-22 mins $Therapeutic Activity: 8-22 mins $Therapeutic Exercise: 8-22 mins  Mauri Brooklyn OTR/L [phone]    Mauri Brooklyn 06/12/2019, 3:50 PM

## 2019-06-13 LAB — CBC WITH DIFFERENTIAL/PLATELET
Abs Immature Granulocytes: 0.54 10*3/uL — ABNORMAL HIGH (ref 0.00–0.07)
Basophils Absolute: 0.1 10*3/uL (ref 0.0–0.1)
Basophils Relative: 1 %
Eosinophils Absolute: 0.2 10*3/uL (ref 0.0–0.5)
Eosinophils Relative: 2 %
HCT: 35.8 % — ABNORMAL LOW (ref 36.0–46.0)
Hemoglobin: 10.8 g/dL — ABNORMAL LOW (ref 12.0–15.0)
Immature Granulocytes: 4 %
Lymphocytes Relative: 14 %
Lymphs Abs: 1.8 10*3/uL (ref 0.7–4.0)
MCH: 22.1 pg — ABNORMAL LOW (ref 26.0–34.0)
MCHC: 30.2 g/dL (ref 30.0–36.0)
MCV: 73.4 fL — ABNORMAL LOW (ref 80.0–100.0)
Monocytes Absolute: 1.5 10*3/uL — ABNORMAL HIGH (ref 0.1–1.0)
Monocytes Relative: 12 %
Neutro Abs: 8.3 10*3/uL — ABNORMAL HIGH (ref 1.7–7.7)
Neutrophils Relative %: 67 %
Platelets: 521 10*3/uL — ABNORMAL HIGH (ref 150–400)
RBC: 4.88 MIL/uL (ref 3.87–5.11)
RDW: 17.3 % — ABNORMAL HIGH (ref 11.5–15.5)
WBC: 12.3 10*3/uL — ABNORMAL HIGH (ref 4.0–10.5)
nRBC: 0.2 % (ref 0.0–0.2)

## 2019-06-13 LAB — COMPREHENSIVE METABOLIC PANEL
ALT: 20 U/L (ref 0–44)
AST: 18 U/L (ref 15–41)
Albumin: 2.6 g/dL — ABNORMAL LOW (ref 3.5–5.0)
Alkaline Phosphatase: 57 U/L (ref 38–126)
Anion gap: 11 (ref 5–15)
BUN: 36 mg/dL — ABNORMAL HIGH (ref 6–20)
CO2: 26 mmol/L (ref 22–32)
Calcium: 9.4 mg/dL (ref 8.9–10.3)
Chloride: 101 mmol/L (ref 98–111)
Creatinine, Ser: 1.14 mg/dL — ABNORMAL HIGH (ref 0.44–1.00)
GFR calc Af Amer: >60 mL/min (ref >60)
GFR calc non Af Amer: 53 mL/min — ABNORMAL LOW (ref >60)
Glucose, Bld: 84 mg/dL (ref 70–99)
Potassium: 4.4 mmol/L (ref 3.5–5.1)
Sodium: 138 mmol/L (ref 135–145)
Total Bilirubin: 0.4 mg/dL (ref 0.3–1.2)
Total Protein: 7 g/dL (ref 6.5–8.1)

## 2019-06-13 LAB — MAGNESIUM: Magnesium: 2 mg/dL (ref 1.7–2.4)

## 2019-06-13 LAB — GLUCOSE, CAPILLARY
Glucose-Capillary: 102 mg/dL — ABNORMAL HIGH (ref 70–99)
Glucose-Capillary: 178 mg/dL — ABNORMAL HIGH (ref 70–99)
Glucose-Capillary: 192 mg/dL — ABNORMAL HIGH (ref 70–99)
Glucose-Capillary: 293 mg/dL — ABNORMAL HIGH (ref 70–99)
Glucose-Capillary: 85 mg/dL (ref 70–99)
Glucose-Capillary: 91 mg/dL (ref 70–99)
Glucose-Capillary: 98 mg/dL (ref 70–99)

## 2019-06-13 LAB — PHOSPHORUS: Phosphorus: 3.9 mg/dL (ref 2.5–4.6)

## 2019-06-13 LAB — C-REACTIVE PROTEIN: CRP: 3.3 mg/dL — ABNORMAL HIGH (ref <1.0)

## 2019-06-13 LAB — D-DIMER, QUANTITATIVE: D-Dimer, Quant: 3.29 ug/mL-FEU — ABNORMAL HIGH (ref 0.00–0.50)

## 2019-06-13 LAB — BRAIN NATRIURETIC PEPTIDE: B Natriuretic Peptide: 21.1 pg/mL (ref 0.0–100.0)

## 2019-06-13 MED ORDER — LIP MEDEX EX OINT
TOPICAL_OINTMENT | CUTANEOUS | Status: DC | PRN
  Filled 2019-06-13: qty 7

## 2019-06-13 MED ORDER — AMPHETAMINE-DEXTROAMPHETAMINE 5 MG PO TABS: 25.0000 mg | ORAL_TABLET | ORAL | Status: DC

## 2019-06-13 MED ORDER — HYDROCORTISONE 1 % EX CREA
TOPICAL_CREAM | Freq: Two times a day (BID) | CUTANEOUS | Status: DC | PRN
  Administered 2019-06-13: 1 via TOPICAL
  Filled 2019-06-13: qty 28

## 2019-06-13 MED ORDER — AMPHETAMINE-DEXTROAMPHET ER 10 MG PO CP24
50.0000 mg | ORAL_CAPSULE | Freq: Every day | ORAL | Status: DC
  Administered 2019-06-13 – 2019-06-16 (×4): 50 mg via ORAL
  Filled 2019-06-13 (×4): qty 5

## 2019-06-13 NOTE — Progress Notes (Signed)
Physical Therapy Treatment Patient Details Name: Brittany Rubio MRN: [id number] DOB: 1961-03-28 Today's Date: 06/13/2019    History of Present Illness Pt is a 58 y/o female with PMHx including DM, HTN, anxiety, depression, breast CA, anemia admitted with severe hypoxic respiratory failure due to covid 19. Pt initially admitted to ICU for heated high flow, found to have DVT during admission and started on heparin.     PT Comments    Pt found in room on room air and sats in 90s, was able to ambulate approx 268ft with HHA and min guard assist on room air and 02 sats remained in 90s. 02 monitored via earlobe probe. Hr during ambulation increased to 120s. Balance and coordination still limiting independence and also safety with mobility. Discussed with pt and spouse need for d/c to SNF and both in agreement with this plan.    Follow Up Recommendations  SNF     Equipment Recommendations  None recommended by PT    Recommendations for Other Services       Precautions / Restrictions Precautions Precautions: Fall Precaution Comments: monitor sats expecially in sleep Restrictions Weight Bearing Restrictions: No    Mobility  Bed Mobility Overal bed mobility: Modified Independent                Transfers Overall transfer level: Needs assistance Equipment used: None Transfers: Sit to/from Stand;Stand Pivot Transfers Sit to Stand: Supervision Stand pivot transfers: Supervision          Ambulation/Gait Ambulation/Gait assistance: Min guard Gait Distance (Feet): 250 Feet Assistive device: 1 person hand held assist Gait Pattern/deviations: Staggering left;Staggering right Gait velocity: decreased       Stairs             Wheelchair Mobility    Modified Rankin (Stroke Patients Only)       Balance Overall balance assessment: Mild deficits observed, not formally tested                                          Cognition  Arousal/Alertness: Awake/alert Behavior During Therapy: WFL for tasks assessed/performed Overall Cognitive Status: Within Functional Limits for tasks assessed                                        Exercises      General Comments        Pertinent Vitals/Pain Pain Assessment: No/denies pain    Home Living                      Prior Function            PT Goals (current goals can now be found in the care plan section) Acute Rehab PT Goals Patient Stated Goal: discussed with spouse need for SNF at dc and both in agreement of need PT Goal Formulation: With patient Time For Goal Achievement: 06/23/19 Potential to Achieve Goals: Good Progress towards PT goals: Progressing toward goals    Frequency    Min 3X/week      PT Plan Discharge plan needs to be updated    Co-evaluation              AM-PAC PT "6 Clicks" Mobility   Outcome Measure  Help needed turning from your back to your  side while in a flat bed without using bedrails?: None Help needed moving from lying on your back to sitting on the side of a flat bed without using bedrails?: None Help needed moving to and from a bed to a chair (including a wheelchair)?: A Little Help needed standing up from a chair using your arms (e.g., wheelchair or bedside chair)?: A Little Help needed to walk in hospital room?: A Little Help needed climbing 3-5 steps with a railing? : A Lot 6 Click Score: 19    End of Session   Activity Tolerance: Patient tolerated treatment well Patient left: in chair;with call bell/phone within reach   PT Visit Diagnosis: Unsteadiness on feet (R26.81);Difficulty in walking, not elsewhere classified (R26.2)     Time: [id number] PT Time Calculation (min) (ACUTE ONLY): 45 min  Charges:  $Gait Training: 23-37 mins                     Horald Chestnut, PT    Delford Field 06/13/2019, 1:18 PM

## 2019-06-13 NOTE — NC FL2 (Signed)
[place] LEVEL OF CARE SCREENING TOOL     IDENTIFICATION  Patient Name: Brittany Rubio Birthdate: 02/07/1961 Sex: female Admission Date (Current Location): 06/04/2019  [facility] and [place] Number:  Herbalist and Address:  [facility], [place] [address] [place], [place] [address]([address])      Provider Number: [id number]  Attending Physician Name and Address:  Allie Bossier, MD  Relative Name and Phone Number:  Husband: Emmaree Dampier   [phone]    Current Level of Care: Hospital Recommended Level of Care: [place] Prior Approval Number:    Date Approved/Denied:   PASRR Number: [id number]  Discharge Plan: SNF    Current Diagnoses: Patient Active Problem List   Diagnosis Date Noted  . Acute respiratory failure with hypoxia ([place]) 06/11/2019  . Acute deep vein thrombosis (DVT) of calf muscle vein of left lower extremity ([place]) 06/11/2019  . Acute deep vein thrombosis (DVT) of calf muscle vein of right lower extremity ([place]) 06/11/2019  . OSA (obstructive sleep apnea) 06/11/2019  . Obesity hypoventilation syndrome ([place]) 06/11/2019  . Essential hypertension 06/11/2019  . Diabetes mellitus type 2, uncontrolled, with complications ([place]) [id number]  . ADD (attention deficit disorder) 06/10/2019  . Depression 06/10/2019  . Anxiety 06/10/2019  . Severe sepsis ([place]) 06/05/2019  . COVID-19 virus infection 06/05/2019  . Pneumonia due to COVID-19 virus 06/04/2019  . Class 2 severe obesity with serious comorbidity and body mass index (BMI) of 37.0 to 37.9 in adult ([place]) 11/17/2018  . Incisional hernia without obstruction or gangrene 06/05/2018  . Nephropathy 05/05/2018  . Pneumonia and influenza 01/28/2017  . Cancer of thyroid gland ([place]) 01/28/2017  . Severe obstructive sleep apnea-hypopnea syndrome 01/28/2017  . Comorbid sleep-related hypoventilation 01/28/2017  . Community acquired pneumonia of  right upper lobe of lung   . Sepsis due to Streptococcus pneumoniae ([place])   . AKI (acute kidney injury) ([place]) 11/26/2016  . Hyperlipidemia 11/26/2016  . GERD (gastroesophageal reflux disease) 11/26/2016  . Lobar pneumonia ([place])   . Type 2 diabetes mellitus with microalbuminuria, without long-term current use of insulin ([place])   . Acute on chronic respiratory failure with hypoxia ([place])   . Septic shock ([place])   . S/P partial thyroidectomy 07/07/2014  . 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 Hypertension 11/26/2011  . Migraines 11/26/2011  . Breast cancer ([place]) 07/05/2011    Orientation RESPIRATION BLADDER Height & Weight     Self, Time, Situation, Place  O2 Continent Weight: 97.2 kg Height:  5\' 5"  (165.1 cm)  BEHAVIORAL SYMPTOMS/MOOD NEUROLOGICAL BOWEL NUTRITION STATUS      Continent Diet  AMBULATORY STATUS COMMUNICATION OF NEEDS Skin   Limited Assist Verbally Normal                       Personal Care Assistance Level of Assistance              Functional Limitations Info             SPECIAL CARE FACTORS FREQUENCY  PT (By licensed PT), OT (By licensed OT)     PT Frequency: 5x/week OT Frequency: min 3x/week            Contractures Contractures Info: Not present    Additional Factors Info  Code Status, Allergies Code Status Info: Full Allergies Info: Shell fish allergy           Current Medications (06/13/2019):  This  is the current hospital active medication list Current Facility-Administered Medications  Medication Dose Route Frequency Provider Last Rate Last Dose  . 0.9 %  sodium chloride infusion   Intravenous PRN Rush Farmer, MD   Stopped at 06/09/19 1104  . acetaminophen (TYLENOL) tablet 650 mg  650 mg Oral Q6H PRN Howerter, Justin B, DO   650 mg at 06/12/19 1955   Or  . acetaminophen (TYLENOL) suppository 650 mg  650 mg Rectal Q6H PRN Howerter, Justin B, DO   650 mg at 06/06/19 2219  . albuterol (VENTOLIN HFA) 108 (90 Base)  MCG/ACT inhaler 1-2 puff  1-2 puff Inhalation Q4H PRN Howerter, Justin B, DO      . amphetamine-dextroamphetamine (ADDERALL XR) 24 hr capsule 50 mg  50 mg Oral Daily Allie Bossier, MD   50 mg at 06/12/19 1036  . apixaban (ELIQUIS) tablet 10 mg  10 mg Oral BID Allie Bossier, MD   10 mg at 06/13/19 1036   Followed by  . [START ON 06/18/2019] apixaban (ELIQUIS) tablet 5 mg  5 mg Oral BID Allie Bossier, MD      . aspirin EC tablet 81 mg  81 mg Oral Daily Howerter, Justin B, DO   81 mg at 06/13/19 1035  . butorphanol (STADOL) nasal spray 1 spray  1 spray Nasal TID PRN Chesley Mires, MD   1 spray at 06/10/19 2026  . Chlorhexidine Gluconate Cloth 2 % PADS 6 each  6 each Topical Daily Elodia Florence., MD   6 each at 06/12/19 [phone]  . cholecalciferol (VITAMIN D3) tablet 1,000 Units  1,000 Units Oral Daily Chesley Mires, MD   1,000 Units at 06/13/19 1035  . dexamethasone (DECADRON) injection 6 mg  6 mg Intravenous Q24H Agarwala, Ravi, MD   6 mg at 06/13/19 1035  . famotidine (PEPCID) tablet 20 mg  20 mg Oral BID Franky Macho, RPH   20 mg at 06/13/19 1035  . insulin aspart (novoLOG) injection 0-20 Units  0-20 Units Subcutaneous Q4H Elodia Florence., MD   4 Units at 06/13/19 1232  . insulin detemir (LEVEMIR) injection 7 Units  7 Units Subcutaneous BID Elodia Florence., MD   7 Units at 06/13/19 1034  . levothyroxine (SYNTHROID) tablet 224 mcg  224 mcg Oral QAC breakfast Elodia Florence., MD   224 mcg at 06/13/19 0541  . linagliptin (TRADJENTA) tablet 5 mg  5 mg Oral Daily Allie Bossier, MD   5 mg at 06/13/19 1034   And  . metFORMIN (GLUCOPHAGE) tablet 500 mg  500 mg Oral BID WC Allie Bossier, MD   500 mg at 06/13/19 0826  . MEDLINE mouth rinse  15 mL Mouth Rinse BID Elodia Florence., MD   15 mL at 06/13/19 1037  . metoprolol tartrate (LOPRESSOR) injection 2.5-5 mg  2.5-5 mg Intravenous Q6H PRN Rush Farmer, MD   5 mg at 06/07/19 1731  . ondansetron (ZOFRAN) tablet 4 mg   4 mg Oral Q6H PRN Howerter, Justin B, DO       Or  . ondansetron (ZOFRAN) injection 4 mg  4 mg Intravenous Q6H PRN Howerter, Justin B, DO      . pneumococcal 23 valent vaccine (PNEUMOVAX-23) injection 0.5 mL  0.5 mL Intramuscular Tomorrow-1000 Sood, Vineet, MD      . sodium chloride flush (NS) 0.9 % injection 10-40 mL  10-40 mL Intracatheter Q12H Rush Farmer, MD  10 mL at 06/13/19 1037  . sodium chloride flush (NS) 0.9 % injection 3 mL  3 mL Intravenous Q12H Howerter, Justin B, DO   3 mL at 06/13/19 1036  . traZODone (DESYREL) tablet 100 mg  100 mg Oral QHS Elodia Florence., MD   100 mg at 06/12/19 2119  . Vilazodone HCl TABS 40 mg  40 mg Oral QHS Allie Bossier, MD   40 mg at 06/12/19 2119  . vitamin C (ASCORBIC ACID) tablet 250 mg  250 mg Oral Daily Chesley Mires, MD   250 mg at 06/13/19 1035  . zinc sulfate capsule 220 mg  220 mg Oral Daily Chesley Mires, MD   220 mg at 06/13/19 1037     Discharge Medications: Please see discharge summary for a list of discharge medications.  Relevant Imaging Results:  Relevant Lab Results:   Additional Information SSN: [id number]  Ninfa Meeker, RN

## 2019-06-13 NOTE — TOC Progression Note (Signed)
Transition of Care [facility]) - Progression Note    Patient Details  Name: Brittany Rubio MRN: [id number] Date of Birth: 1961/04/06  Transition of Care [facility]) CM/SW Contact  Loletha Grayer Beverely Pace, RN Phone Number: 06/13/2019, 1:44 PM  Clinical Narrative:  Case manager spoke with patient via telephone concerning discharge plan. Patient understands she may need shotrterm rehab, states she is agreeable to being faxed out. Hopes that she can improve and be able to discharge home instead. Patient says she does have a support system that could assist her if she were to go home. Patient also states that she knows her husband will need SNF likely and she wants them tho be able to be at the same facility. Case manager will enter note on his chart as well. FL2 completed. Patient agrees to being faxed to [facility] and [facility] Will continue to monitor.    Expected Discharge Plan: Skilled Nursing Facility Barriers to Discharge: Insurance Authorization  Expected Discharge Plan and Services Expected Discharge Plan: [place]   Discharge Planning Services: CM Consult Post Acute Care Choice: [place] Living arrangements for the past 2 months: Single Family Home                 DME Arranged: N/A         HH Arranged: NA HH Agency: NA         Social Determinants of Health (SDOH) Interventions    Readmission Risk Interventions No flowsheet data found.

## 2019-06-13 NOTE — Progress Notes (Addendum)
Sister Rise Paganini called and updated on plan of care and patient status. Rise Paganini asking if she can have an update on the pateint's husband as well. Will pass on to husband's nurse.  While ambulating in hall to see husband:  DME Patient Saturations on Room Air at Rest =95-100% Patient Saturations on Room Air while Ambulating =90-96%

## 2019-06-13 NOTE — Plan of Care (Signed)
  Problem: Education: Goal: Knowledge of General Education information will improve Description: Including pain rating scale, medication(s)/side effects and non-pharmacologic comfort measures Outcome: Progressing   Problem: Health Behavior/Discharge Planning: Goal: Ability to manage health-related needs will improve Outcome: Progressing   Problem: Clinical Measurements: Goal: Ability to maintain clinical measurements within normal limits will improve Outcome: Progressing Goal: Diagnostic test results will improve Outcome: Progressing Goal: Respiratory complications will improve Outcome: Progressing Goal: Cardiovascular complication will be avoided Outcome: Progressing   Problem: Activity: Goal: Risk for activity intolerance will decrease Outcome: Progressing   Problem: Nutrition: Goal: Adequate nutrition will be maintained Outcome: Progressing   Problem: Coping: Goal: Level of anxiety will decrease Outcome: Progressing   Problem: Elimination: Goal: Will not experience complications related to bowel motility Outcome: Progressing   Problem: Pain Managment: Goal: General experience of comfort will improve Outcome: Progressing   Problem: Safety: Goal: Ability to remain free from injury will improve Outcome: Progressing   Problem: Skin Integrity: Goal: Risk for impaired skin integrity will decrease Outcome: Progressing

## 2019-06-13 NOTE — Progress Notes (Signed)
PROGRESS NOTE    Brittany Rubio  [id number] DOB: 24-Jul-1960 DOA: 06/04/2019 PCP: Glendale Chard, MD   Brief Narrative:   58 yo BF PMHx ADD, Anxiety, Depression, HTN, HLD, OSA on CPAP, Thyroid CA S/P XRT, DM Type 2 uncontrolled with complication  Developed a non productive cough, subjective fever, chills, myalgias 10 days prior to admission. She then developed progressive dyspnea after testing positive for COVID 19 on 05/27/19.  She developed severe hypoxic respiratory failure and was kept in ICU on heated high flow, was found to have DVT during this admission and started on heparin complicated by mild nosebleed.  Hypoxia has improved she still on heated high flow and transferred to my service on 06/09/2019.   Subjective: 11/28 last 24 hours afebrile, negative CP, negative N/V, negative abdominal pain.  Positive S OB.   Assessment & Plan:   Principal Problem:   Pneumonia due to COVID-19 virus Active Problems:   Hypertension   AKI (acute kidney injury) ([place])   Lobar pneumonia (HCC)   Type 2 diabetes mellitus with microalbuminuria, without long-term current use of insulin (HCC)   Acute on chronic respiratory failure with hypoxia (HCC)   Cancer of thyroid gland (HCC)   Severe obstructive sleep apnea-hypopnea syndrome   Severe sepsis (HCC)   COVID-19 virus infection   ADD (attention deficit disorder)   Depression   Anxiety   Acute respiratory failure with hypoxia (HCC)   Acute deep vein thrombosis (DVT) of calf muscle vein of left lower extremity (HCC)   Acute deep vein thrombosis (DVT) of calf muscle vein of right lower extremity (HCC)   OSA (obstructive sleep apnea)   Obesity hypoventilation syndrome ([place])   Essential hypertension   Diabetes mellitus type 2, uncontrolled, with complications ([place])   Covid pneumonia/acute on chronic respiratory failure with hypoxia COVID-19 Labs  Recent Labs    06/11/19 0522 06/12/19 0605 06/13/19 0102  DDIMER 3.31* 3.20* 3.29*   CRP 4.1* 6.7* 3.3*    Lab Results  Component Value Date   SARSCOV2NAA Detected (A) 05/27/2019  -Albuterol -Decadron 6 mg daily -Completed course of Remdesivir -Vitamins per Covid protocol -SATURATION QUALIFICATIONS: (This note is used to comply with regulatory documentation for home oxygen) Patient Saturations on Room Air at Rest = 88-92% Patient Saturations on Room Air while Ambulating =86-90 % Patient Saturations on 2 Liters of oxygen while Ambulating = 96% Please briefly explain why patient needs home oxygen: Patient has shortness of breathe during ambulation and while performing ADLs. She takes 3-5 minutes to recover to baseline 02 of 88-92% on room air after activity -Patient qualifies for home O2 -2 L O2 via [place] at all times.  Titrate to maintain SPO2> 88% -Provide [employer]ogen portable home O2 concentrator "  Acute bilateral lower extremity DVT -Heparin per pharmacy -11/26 DC heparin start apixaban  Severe OSA/OHS syndrome -Patient will need to have PCP refer to pulmonology for complete spirometry, and sleep study. -Patient most likely requires CPAP/BiPAP  ADD/depression/anxiety -Adderall 20 mg daily -Vilazodone 40 mg qhs -We will hold benzodiazepine for her anxiety given her respiratory issues.  Essential HTN -BP controlled without medication.  AKI Recent Labs  Lab 06/09/19 0810 06/10/19 0250 06/11/19 0555 06/12/19 0605 06/13/19 0102  CREATININE 1.62* 1.17* 1.16* 1.04* 1.14*  -Improving but not yet back to baseline  Diabetes type 2 uncontrolled with complication -[id number] hemoglobin A1c= 8.7 -11/26 Janumet 50-1000 mg BID  Hypokalemia -Potassium goal> 4  Hypomagnesmia -Magnesium goal> 2   Goals of care -11/27 PT/OT  consult; patient unstable on feet, no one at home to care for her.  Yesterday afternoon SPO2 in 70s when [place] fell off, patient only woke because of RN.  Evaluate for CIR vs SNF -Patient has agreed to CIR vs SNF    DVT prophylaxis: Heparin per  pharmacy Code Status: Full Family Communication: 11/26 husband is in hospital for Covid infection, was going to discharge patient in a.m. we will have to reevaluate to determine if she has care at home. Disposition Plan: TBD   Consultants:    Procedures/Significant Events:  Echo 11/20 >> EF 60 to 65%, mod TR, mod elevation in PASP  Leg [place] -   Right: Findings consistent with acute deep vein thrombosis involving the right posterior tibial veins, right soleal veins, and right gastrocnemius veins.   Left: Findings consistent with acute deep vein thrombosis involving the left posterior tibial veins, and left peroneal veins.   I have personally reviewed and interpreted all radiology studies and my findings are as above.  VENTILATOR SETTINGS: Room air 11/28 SPO2 99%     Cultures   Antimicrobials: Anti-infectives (From admission, onward)   Start     Stop   06/07/19 1000  azithromycin (ZITHROMAX) tablet 500 mg  Status:  Discontinued     06/08/19 0856   06/06/19 1000  remdesivir 100 mg in sodium chloride 0.9 % 250 mL IVPB     06/09/19 0955   06/05/19 1000  cefTRIAXone (ROCEPHIN) 2 g in sodium chloride 0.9 % 100 mL IVPB  Status:  Discontinued     06/08/19 0856   06/05/19 1000  azithromycin (ZITHROMAX) 500 mg in sodium chloride 0.9 % 250 mL IVPB  Status:  Discontinued     06/07/19 0830   06/05/19 0830  remdesivir 200 mg in sodium chloride 0.9 % 250 mL IVPB     06/05/19 1115       Devices    LINES / TUBES:     Continuous Infusions: . sodium chloride Stopped (06/09/19 1104)     Objective: Vitals:   06/13/19 0600 06/13/19 0752 06/13/19 1156 06/13/19 1641  BP:  (!) 122/107 137/65 132/85  Pulse:  81 87 91  Resp:  18 14 20   Temp:  98 F (36.7 C) 98.2 F (36.8 C) 98 F (36.7 C)  TempSrc:  Oral Oral Oral  SpO2:  92% 100% 99%  Weight: 97.2 kg     Height:        Intake/Output Summary (Last 24 hours) at 06/13/2019 1806 Last data filed at 06/13/2019 1347 Gross  per 24 hour  Intake 820 ml  Output -  Net 820 ml   Filed Weights   06/10/19 0500 06/11/19 0500 06/13/19 0600  Weight: 96.9 kg 97.4 kg 97.2 kg   Physical Exam:  General: A/O x4, negative acute respiratory distress Eyes: negative scleral hemorrhage, negative anisocoria, negative icterus ENT: Negative Runny nose, negative gingival bleeding, Neck:  Negative scars, masses, torticollis, lymphadenopathy, JVD Lungs: Clear to auscultation bilaterally without wheezes or crackles Cardiovascular: Regular rate and rhythm without murmur gallop or rub normal S1 and S2 Abdomen: MORBIDLY OBESE negative abdominal pain, nondistended, positive soft, bowel sounds, no rebound, no ascites, no appreciable mass Extremities: No significant cyanosis, clubbing, or edema bilateral lower extremities Skin: Negative rashes, lesions, ulcers Psychiatric:  Negative depression, negative anxiety, negative fatigue, negative mania  Central nervous system:  Cranial nerves II through XII intact, tongue/uvula midline, all extremities muscle strength 5/5, sensation intact throughout, negative dysarthria, negative expressive aphasia, negative  receptive aphasia..     Data Reviewed: Care during the described time interval was provided by me .  I have reviewed this patient's available data, including medical history, events of note, physical examination, and all test results as part of my evaluation.   CBC: Recent Labs  Lab 06/09/19 0810 06/10/19 0250 06/11/19 0555 06/12/19 0605 06/13/19 0102  WBC 15.7* 15.6* 14.4* 10.9* 12.3*  NEUTROABS  --  10.5* 9.4* 6.8 8.3*  HGB 12.2 12.1 11.1* 10.5* 10.8*  HCT 40.2 39.5 35.8* 34.2* 35.8*  MCV 72.0* 71.8* 71.6* 72.5* 73.4*  PLT 423* 470* 482* 449* [id number]*   Basic Metabolic Panel: Recent Labs  Lab 06/07/19 0333 06/08/19 0518 06/09/19 0810 06/10/19 0250 06/11/19 0555 06/12/19 0605 06/13/19 0102  NA 141 136 138 136 138 139 138  K 4.2 3.4* 3.8 3.7 3.4* 4.2 4.4  CL 100 95* 94*  96* 99 103 101  CO2 27 27 30 27 27 28 26   GLUCOSE 95 88 99 69* 87 92 84  BUN 24* 33* 66* 60* 46* 38* 36*  CREATININE 0.80 1.38* 1.62* 1.17* 1.16* 1.04* 1.14*  CALCIUM 8.2* 8.3* 8.4* 8.9 9.1 9.0 9.4  MG 1.9 1.9 2.2 2.3 1.8 2.0 2.0  PHOS 2.2* 3.5  --   --   --  3.2 3.9   GFR: Estimated Creatinine Clearance: 61.3 mL/min (A) (by C-G formula based on SCr of 1.14 mg/dL (H)). Liver Function Tests: Recent Labs  Lab 06/10/19 0250 06/11/19 0555 06/12/19 0605 06/13/19 0102  AST 27 22 15 18   ALT 26 25 20 20   ALKPHOS 79 76 59 57  BILITOT 0.5 0.3 0.4 0.4  PROT 7.5 6.7 6.7 7.0  ALBUMIN 2.8* 2.5* 2.4* 2.6*   No results for input(s): LIPASE, AMYLASE in the last 168 hours. No results for input(s): AMMONIA in the last 168 hours. Coagulation Profile: No results for input(s): INR, PROTIME in the last 168 hours. Cardiac Enzymes: No results for input(s): CKTOTAL, CKMB, CKMBINDEX, TROPONINI in the last 168 hours. BNP (last 3 results) No results for input(s): PROBNP in the last 8760 hours. HbA1C: No results for input(s): HGBA1C in the last 72 hours. CBG: Recent Labs  Lab 06/13/19 0001 06/13/19 0406 06/13/19 0751 06/13/19 1155 06/13/19 1640  GLUCAP 98 85 102* 178* 293*   Lipid Profile: No results for input(s): CHOL, HDL, LDLCALC, TRIG, CHOLHDL, LDLDIRECT in the last 72 hours. Thyroid Function Tests: No results for input(s): TSH, T4TOTAL, FREET4, T3FREE, THYROIDAB in the last 72 hours. Anemia Panel: No results for input(s): VITAMINB12, FOLATE, FERRITIN, TIBC, IRON, RETICCTPCT in the last 72 hours. Urine analysis:    Component Value Date/Time   COLORURINE YELLOW 06/05/2019 0052   APPEARANCEUR CLEAR 06/05/2019 0052   LABSPEC 1.025 06/05/2019 0052   PHURINE 5.0 06/05/2019 0052   GLUCOSEU >=500 (A) 06/05/2019 0052   HGBUR NEGATIVE 06/05/2019 0052   BILIRUBINUR NEGATIVE 06/05/2019 0052   BILIRUBINUR Negative 05/18/2019 1704   KETONESUR 5 (A) 06/05/2019 0052   PROTEINUR 100 (A)  06/05/2019 0052   UROBILINOGEN 0.2 05/18/2019 1704   NITRITE NEGATIVE 06/05/2019 0052   LEUKOCYTESUR MODERATE (A) 06/05/2019 0052   Sepsis Labs: @LABRCNTIP (procalcitonin:4,lacticidven:4)  ) Recent Results (from the past 240 hour(s))  Culture, Urine     Status: Abnormal   Collection Time: 06/05/19 12:30 AM   Specimen: Urine, Random  Result Value Ref Range Status   Specimen Description URINE, RANDOM  Final   Special Requests NONE  Final   Culture (A)  Final    <  10,000 COLONIES/mL INSIGNIFICANT GROWTH Performed at [facility], [place] [address], [place], [place] [address]    Report Status 06/06/2019 FINAL  Final  Culture, blood (routine x 2)     Status: None   Collection Time: 06/05/19 12:57 AM   Specimen: BLOOD RIGHT HAND  Result Value Ref Range Status   Specimen Description BLOOD RIGHT HAND  Final   Special Requests   Final    BOTTLES DRAWN AEROBIC AND ANAEROBIC Blood Culture adequate volume   Culture   Final    NO GROWTH 5 DAYS Performed at [facility], [place] [address] [place], [place] [address]    Report Status 06/10/2019 FINAL  Final  Culture, blood (routine x 2)     Status: None   Collection Time: 06/05/19  1:06 AM   Specimen: BLOOD LEFT FOREARM  Result Value Ref Range Status   Specimen Description BLOOD LEFT FOREARM  Final   Special Requests   Final    BOTTLES DRAWN AEROBIC AND ANAEROBIC Blood Culture adequate volume   Culture   Final    NO GROWTH 5 DAYS Performed at [facility], [place] [address], [place], [place] [address]    Report Status 06/10/2019 FINAL  Final  MRSA PCR Screening     Status: None   Collection Time: 06/05/19  7:03 PM   Specimen: Nasal Mucosa; Nasopharyngeal  Result Value Ref Range Status   MRSA by PCR NEGATIVE NEGATIVE Final    Comment:        The GeneXpert MRSA Assay (FDA approved for NASAL specimens only), is one component of a comprehensive MRSA colonization surveillance program. It is not intended to diagnose MRSA  infection nor to guide or monitor treatment for MRSA infections. Performed at [facility], [place] [address], [place],  [address]          Radiology Studies: No results found.      Scheduled Meds: . amphetamine-dextroamphetamine  50 mg Oral Q breakfast  . apixaban  10 mg Oral BID   Followed by  . [START ON 06/18/2019] apixaban  5 mg Oral BID  . aspirin EC  81 mg Oral Daily  . Chlorhexidine Gluconate Cloth  6 each Topical Daily  . cholecalciferol  1,000 Units Oral Daily  . dexamethasone (DECADRON) injection  6 mg Intravenous Q24H  . famotidine  20 mg Oral BID  . insulin aspart  0-20 Units Subcutaneous Q4H  . insulin detemir  7 Units Subcutaneous BID  . levothyroxine  224 mcg Oral QAC breakfast  . linagliptin  5 mg Oral Daily   And  . metFORMIN  500 mg Oral BID WC  . mouth rinse  15 mL Mouth Rinse BID  . pneumococcal 23 valent vaccine  0.5 mL Intramuscular Tomorrow-1000  . sodium chloride flush  10-40 mL Intracatheter Q12H  . sodium chloride flush  3 mL Intravenous Q12H  . traZODone  100 mg Oral QHS  . Vilazodone HCl  40 mg Oral QHS  . vitamin C  250 mg Oral Daily  . zinc sulfate  220 mg Oral Daily   Continuous Infusions: . sodium chloride Stopped (06/09/19 1104)     LOS: 9 days   The patient is critically ill with multiple organ systems failure and requires high complexity decision making for assessment and support, frequent evaluation and titration of therapies, application of advanced monitoring technologies and extensive interpretation of multiple databases. Critical Care Time devoted to patient care services described  in this note  Time spent: 40 minutes     Angle Dirusso, Geraldo Docker, MD [facility] Pager [phone]  If 7PM-7AM, please contact night-coverage [email or web address] Password [facility] 06/13/2019, 6:06 PM

## 2019-06-13 NOTE — Progress Notes (Addendum)
Pt is on Adderall XR prior to admission. We don't have the formulation here so we changing it to the immediate release formulation and dosed BID. Ok per Dr. Sherral Hammers  Addendum:  We were able to acquire some some supply so we will use the XR formulation.   Onnie Boer, PharmD, BCIDP, AAHIVP, CPP Infectious Disease Pharmacist 06/13/2019 1:42 PM

## 2019-06-13 NOTE — TOC Progression Note (Signed)
Transition of Care [facility]) - Progression Note    Patient Details  Name: Brittany Rubio MRN: [id number] Date of Birth: 01/29/1961  [facility]) CM/SW Contact  Loletha Grayer Beverely Pace, RN Phone Number: 06/13/2019, 12:55 PM  Clinical Narrative:   Case manager will begin SNF workup. Patient's husband is hospitalized with COVID as well.          Expected Discharge Plan and Services                                                 Social Determinants of Health (SDOH) Interventions    Readmission Risk Interventions No flowsheet data found.

## 2019-06-13 NOTE — Progress Notes (Signed)
Virtual Visit via Video   This visit type was conducted due to national recommendations for restrictions regarding the COVID-19 Pandemic (e.g. social distancing) in an effort to limit this patient's exposure and mitigate transmission in our community.  Due to her co-morbid illnesses, this patient is at least at moderate risk for complications without adequate follow up.  This format is felt to be most appropriate for this patient at this time.  All issues noted in this document were discussed and addressed.  A limited physical exam was performed with this format.    This visit type was conducted due to national recommendations for restrictions regarding the COVID-19 Pandemic (e.g. social distancing) in an effort to limit this patient's exposure and mitigate transmission in our community.  Patients identity confirmed using two different identifiers.  This format is felt to be most appropriate for this patient at this time.  All issues noted in this document were discussed and addressed.  No physical exam was performed (except for noted visual exam findings with Video Visits).    Date:  06/13/2019   ID:  Brittany Rubio, DOB 01/15/1961, MRN [id number]  Patient Location:  Home  Provider location:   Office    Chief Complaint:  "I have coronavirus"  History of Present Illness:    Brittany Rubio is a 58 y.o. female who presents via video conferencing for a telehealth visit today.    The patient does have symptoms concerning for COVID-19 infection (fever, chills, cough, or new shortness of breath).   She presents today for virtual visit. She prefers this method of contact due to COVID-19 pandemic.  She reports that she has been diagnosed with COVID-19. Her husband is currently in the hospital now with the same diagnosis. She reports he developed symptoms last week that worsened this past Saturday. She went with him to get tested last week, not b/c she had symptoms, but b/c she had been  around him. She does have dry cough and dizziness.  She denies having any fever, chills, and body aches at this time.     Past Medical History:  Diagnosis Date  . ADD (attention deficit disorder)   . Anemia   . Anxiety   . Arthritis    "left shoulder" (07/07/2014)  . Back pain   . Breast cancer ([facility])    "left"  . Depression   . Fatty liver   . Food allergy    shellfish  . GERD (gastroesophageal reflux disease)    "takes over the counters as needed"  . Hernia, umbilical   . High cholesterol   . Hypertension 11/26/2011   sees Dr. Bryon Lions  . Hypothyroidism   . Joint pain   . Migraines    "maybe once q 3 months" (07/07/2014)  . Multinodular thyroid 2015  . OSA on CPAP   . Personal history of radiation therapy   . Pneumonia    May 2018  . Thyroid cancer ([place])    2015  . Type II diabetes mellitus ([facility])   . Vitamin D deficiency    Past Surgical History:  Procedure Laterality Date  . ABDOMINAL HYSTERECTOMY  ? 1997  . BREAST EXCISIONAL BIOPSY Right 2014  . BREAST LUMPECTOMY Left 2009  . BREAST LUMPECTOMY Right 07/2012   "not a mastectomy"  . BREAST REDUCTION SURGERY Bilateral   . DIAPHRAGM SURGERY  1986   "trauma"  . INCISIONAL HERNIA REPAIR N/A 06/05/2018   Procedure: [place];  Surgeon:  Erroll Luna, MD;  Location: [facility];  Service: General;  Laterality: N/A;  . INSERTION OF MESH N/A 06/05/2018   Procedure: INSERTION OF MESH;  Surgeon: Erroll Luna, MD;  Location: [facility];  Service: General;  Laterality: N/A;  . PARTIAL MASTECTOMY WITH NEEDLE LOCALIZATION  08/12/2012   Procedure: PARTIAL MASTECTOMY WITH NEEDLE LOCALIZATION;  Surgeon: Joyice Faster. Cornett, MD;  Location: [facility];  Service: General;  Laterality: Right;  . REDUCTION MAMMAPLASTY Bilateral 1995  . THYROIDECTOMY Left 07/07/2014   Procedure: LEFT HEMI-THYROIDECTOMY;  Surgeon: Ascencion Dike, MD;  Location: [facility];  Service: ENT;  Laterality: Left;  . THYROIDECTOMY Right  07/08/2014   Procedure: Completion THYROIDECTOMY;  Surgeon: Ascencion Dike, MD;  Location: [facility];  Service: ENT;  Laterality: Right;  . THYROIDECTOMY, PARTIAL Left 07/07/2014   hemi     Current Meds  Medication Sig  . albuterol (VENTOLIN HFA) 108 (90 Base) MCG/ACT inhaler Inhale 2 puffs into the lungs every 6 (six) hours as needed for wheezing or shortness of breath.  . amphetamine-dextroamphetamine (ADDERALL XR) 20 MG 24 hr capsule Take 1 capsule (20 mg total) by mouth daily. Take with 30 mg to equal 50 mg daily  . amphetamine-dextroamphetamine (ADDERALL XR) 30 MG 24 hr capsule Take 30 mg by mouth daily. Take with 20 mg to equal 50 mg daily  . amphetamine-dextroamphetamine (ADDERALL) 10 MG tablet Take 10 mg by mouth daily in the afternoon.   Marland Kitchen aspirin EC 81 MG tablet Take 81 mg by mouth daily.  . benzonatate (TESSALON PERLES) 100 MG capsule Take 1 capsule (100 mg total) by mouth 3 (three) times daily as needed.  . butorphanol (STADOL) 10 MG/ML nasal spray PLACE 1 SPRAY INTO THE NOSE 3 (THREE) TIMES DAILY AS NEEDED FOR MIGRAINE.  . cholecalciferol (VITAMIN D3) 25 MCG (1000 UT) tablet Take 1,000 Units by mouth daily.  . Cyanocobalamin (VITAMIN B-12 PO) Take 1 tablet by mouth once a week. Tuesdays  . diazepam (VALIUM) 5 MG tablet TAKE 1 TABLET BY MOUTH DAILY AS NEEDED FOR ANXIETY. (Patient taking differently: Take 5 mg by mouth daily as needed for anxiety. )  . diltiazem (TIAZAC) 240 MG 24 hr capsule TAKE 1 TABLET BY MOUTH AT BEDTIME FOR BP (Patient taking differently: Take 240 mg by mouth at bedtime. )  . EPINEPHrine (EPIPEN 2-PAK) 0.3 mg/0.3 mL IJ SOAJ injection 0.3 mLs by Subdermal route once as needed. Allergic reaction  . esomeprazole (NEXIUM) 20 MG capsule Take 20 mg by mouth daily as needed (acid reflux).   Marland Kitchen FARXIGA 10 MG TABS tablet TAKE 1 TABLET BY MOUTH EVERY DAY (Patient taking differently: Take 10 mg by mouth daily. )  . fexofenadine (ALLEGRA) 180 MG tablet Take 180 mg by mouth daily as  needed for allergies or rhinitis.  Marland Kitchen glucose blood (ONETOUCH ULTRA) test strip Test twice daily  . JANUMET 50-1000 MG tablet TAKE 1 TABLET BY MOUTH TWICE A DAY WITH MEALS (Patient taking differently: Take 1 tablet by mouth 2 (two) times daily with a meal. )  . Lancets (ONETOUCH ULTRASOFT) lancets Test twice daily  . levothyroxine (SYNTHROID, LEVOTHROID) 112 MCG tablet Take 224 mcg by mouth every morning.   Marland Kitchen lisinopril-hydrochlorothiazide (ZESTORETIC) 20-25 MG tablet TAKE 1 TABLET BY MOUTH EVERY DAY (Patient taking differently: Take 1 tablet by mouth daily. )  . Magnesium 500 MG CAPS Take 500 mg by mouth daily.   Marland Kitchen omega-3 acid ethyl esters (LOVAZA) 1 g capsule Take 1 g by mouth  daily.   . promethazine (PHENERGAN) 25 MG tablet Take 25 mg by mouth every 8 (eight) hours as needed for nausea or vomiting.   . ranitidine (ZANTAC) 150 MG tablet Take 150-300 mg by mouth See admin instructions. Take 300 mg in the morning and may take an additional 150 mg at night as needed for heartburn  . traZODone (DESYREL) 50 MG tablet Take 2 tabs po qhs (Patient taking differently: Take 100 mg by mouth at bedtime. )  . Turmeric Curcumin 500 MG CAPS Take 500 mg by mouth daily.   Marland Kitchen VIIBRYD 40 MG TABS TAKE 1 TABLET BY MOUTH EVERY DAY WITH FOOD (Patient taking differently: Take 40 mg by mouth daily. with food.)  . vitamin C (ASCORBIC ACID) 500 MG tablet Take 500 mg by mouth daily.     Allergies:   Otho Darner allergy]   Social History   Tobacco Use  . Smoking status: Never Smoker  . Smokeless tobacco: Never Used  Substance Use Topics  . Alcohol use: Yes    Comment: 07/07/2014 "a few times/year; weddings, anniversary, etc"  . Drug use: No     Family Hx: The patient's family history includes Alcoholism in her father; Cancer in her mother; Depression in her mother; Diabetes in her mother; Hypertension in her father and mother.  ROS:   Please see the history of present illness.    Review of Systems   Constitutional: Negative.   Respiratory: Positive for cough.   Cardiovascular: Negative.   Gastrointestinal: Negative.   Neurological: Positive for dizziness.  Psychiatric/Behavioral: Negative.     All other systems reviewed and are negative.   Labs/Other Tests and Data Reviewed:    Recent Labs: 05/18/2019: TSH 6.050 06/13/2019: ALT 20; B Natriuretic Peptide 21.1; BUN 36; Creatinine, Ser 1.14; Hemoglobin 10.8; Magnesium 2.0; Platelets 521; Potassium 4.4; Sodium 138   Recent Lipid Panel Lab Results  Component Value Date/Time   CHOL 208 (H) 11/13/2018 02:25 PM   TRIG 158 (H) 06/04/2019 07:00 PM   HDL 49 11/13/2018 02:25 PM   CHOLHDL 4.2 11/13/2018 02:25 PM   LDLCALC 130 (H) 11/13/2018 02:25 PM    Wt Readings from Last 3 Encounters:  06/13/19 214 lb 4.6 oz (97.2 kg)  05/18/19 227 lb 6.4 oz (103.1 kg)  02/10/19 223 lb 3.2 oz (101.2 kg)     Exam:    Vital Signs:  Temp 99.1 F (37.3 C) (Oral)   Ht 5\' 5"  (1.651 m)   BMI 37.84 kg/m     Physical Exam  Constitutional: She is oriented to person, place, and time and well-developed, well-nourished, and in no distress.  HENT:  Head: Normocephalic and atraumatic.  Pulmonary/Chest: Effort normal.  Neurological: She is alert and oriented to person, place, and time.  Psychiatric: Affect normal.  Nursing note and vitals reviewed. She is able to speak in full sentences.   ASSESSMENT & PLAN:     1. COVID-19  She is encouraged to go to ER should she develop worsening shortness of breath. I will send in rx Azithromycin - she is encouraged to take full course of abx. Also advised to take Oscillococcinum per package instructions. All questions were answered to her satisfaction.   2. Dizziness  She is encouraged to stay well hydrated. Pt advised that we may have to decrease her bp meds during this illness if her sx persist.     COVID-19 Education: The signs and symptoms of COVID-19 were discussed with the patient and how to seek  care for testing (follow up with PCP or arrange E-visit).  The importance of social distancing was discussed today.  Patient Risk:   After full review of this patients clinical status, I feel that they are at least moderate risk at this time.     Medication Adjustments/Labs and Tests Ordered: Current medicines are reviewed at length with the patient today.  Concerns regarding medicines are outlined above.   Tests Ordered: No orders of the defined types were placed in this encounter.   Medication Changes: Meds ordered this encounter  Medications  . azithromycin (ZITHROMAX) 250 MG tablet    Sig: Take 2 tablets (500 mg) on  Day 1,  followed by 1 tablet (250 mg) once daily on Days 2 through 5.    Dispense:  6 each    Refill:  0    Disposition:  Follow up prn  Signed, [name], MD

## 2019-06-14 LAB — GLUCOSE, CAPILLARY
Glucose-Capillary: 101 mg/dL — ABNORMAL HIGH (ref 70–99)
Glucose-Capillary: 107 mg/dL — ABNORMAL HIGH (ref 70–99)
Glucose-Capillary: 115 mg/dL — ABNORMAL HIGH (ref 70–99)
Glucose-Capillary: 288 mg/dL — ABNORMAL HIGH (ref 70–99)
Glucose-Capillary: 81 mg/dL (ref 70–99)
Glucose-Capillary: 96 mg/dL (ref 70–99)

## 2019-06-14 LAB — PHOSPHORUS: Phosphorus: 3.9 mg/dL (ref 2.5–4.6)

## 2019-06-14 MED ORDER — MENTHOL 3 MG MT LOZG
1.0000 | LOZENGE | OROMUCOSAL | Status: DC | PRN
  Administered 2019-06-14: 3 mg via ORAL
  Filled 2019-06-14: qty 9

## 2019-06-14 NOTE — Progress Notes (Signed)
PROGRESS NOTE    Brittany Rubio  [id number] DOB: 1961/07/06 DOA: 06/04/2019 PCP: Glendale Chard, MD   Brief Narrative:   58 yo BF PMHx ADD, Anxiety, Depression, HTN, HLD, OSA on CPAP, Thyroid CA S/P XRT, DM Type 2 uncontrolled with complication  Developed a non productive cough, subjective fever, chills, myalgias 10 days prior to admission. She then developed progressive dyspnea after testing positive for COVID 19 on 05/27/19.  She developed severe hypoxic respiratory failure and was kept in ICU on heated high flow, was found to have DVT during this admission and started on heparin complicated by mild nosebleed.  Hypoxia has improved she still on heated high flow and transferred to my service on 06/09/2019.   Subjective: 11/29 last 24 hours afebrile     11/28 last 24 hours afebrile, negative CP, negative N/V, negative abdominal pain.  Positive S OB.   Assessment & Plan:   Principal Problem:   Pneumonia due to COVID-19 virus Active Problems:   Hypertension   AKI (acute kidney injury) ([facility])   Lobar pneumonia (HCC)   Type 2 diabetes mellitus with microalbuminuria, without long-term current use of insulin ([facility])   Acute on chronic respiratory failure with hypoxia (H[facility])   Cancer of thyroid gland (HCC)   Severe obstructive sleep apnea-hypopnea syndrome   Severe sepsis (HCC)   COVID-19 virus infection   ADD (attention deficit disorder)   Depression   Anxiety   Acute respiratory failure with hypoxia (HCC)   Acute deep vein thrombosis (DVT) of calf muscle vein of left lower extremity (HCC)   Acute deep vein thrombosis (DVT) of calf muscle vein of right lower extremity (HCC)   OSA (obstructive sleep apnea)   Obesity hypoventilation syndrome ([facility])   Essential hypertension   Diabetes mellitus type 2, uncontrolled, with complications ([place])   Covid pneumonia/acute on chronic respiratory failure with hypoxia COVID-19 Labs  Recent Labs    06/12/19 0605 06/13/19 0102    DDIMER 3.20* 3.29*  CRP 6.7* 3.3*    Lab Results  Component Value Date   SARSCOV2NAA Detected (A) 05/27/2019  -Albuterol -Decadron 6 mg daily -Completed course of Remdesivir -Vitamins per Covid protocol -SATURATION QUALIFICATIONS: (This note is used to comply with regulatory documentation for home oxygen) Patient Saturations on Room Air at Rest = 88-92% Patient Saturations on Room Air while Ambulating =86-90 % Patient Saturations on 2 Liters of oxygen while Ambulating = 96% Please briefly explain why patient needs home oxygen: Patient has shortness of breathe during ambulation and while performing ADLs. She takes 3-5 minutes to recover to baseline 02 of 88-92% on room air after activity -Patient qualifies for home O2 -2 L O2 via [place] at all times.  Titrate to maintain SPO2> 88% -Provide Inogen portable home O2 concentrator "  Acute bilateral lower extremity DVT -Heparin per pharmacy -11/26 DC heparin start apixaban  Severe OSA/OHS syndrome -Patient will need to have PCP refer to pulmonology for complete spirometry, and sleep study. -Patient most likely requires CPAP/BiPAP  ADD/depression/anxiety -Adderall 20 mg daily -Vilazodone 40 mg qhs -We will hold benzodiazepine for her anxiety given her respiratory issues.  Essential HTN -BP controlled without medication.  AKI Recent Labs  Lab 06/09/19 0810 06/10/19 0250 06/11/19 0555 06/12/19 0605 06/13/19 0102  CREATININE 1.62* 1.17* 1.16* 1.04* 1.14*  -Improving but not yet back to baseline  Diabetes type 2 uncontrolled with complication -[id number] hemoglobin A1c= 8.7 -11/26 Janumet 50-1000 mg BID  Hypokalemia -Potassium goal> 4  Hypomagnesmia -Magnesium goal> 2  Goals of care -11/27 PT/OT consult; patient unstable on feet, no one at home to care for her.  Yesterday afternoon SPO2 in 70s when [place] fell off, patient only woke because of RN.  Evaluate for CIR vs SNF -Patient has agreed to CIR vs SNF -11/29 spoke with NCM  Aldona [facility] [place] awaiting clearance from [facility]    DVT prophylaxis: Heparin per pharmacy Code Status: Full Family Communication: 11/26 husband is in hospital for Covid infection, was going to discharge patient in a.m. we will have to reevaluate to determine if she has care at home. Disposition Plan: TBD   Consultants:    Procedures/Significant Events:  Echo 11/20 >> EF 60 to 65%, mod TR, mod elevation in PASP  Leg [place] -   Right: Findings consistent with acute deep vein thrombosis involving the right posterior tibial veins, right soleal veins, and right gastrocnemius veins.   Left: Findings consistent with acute deep vein thrombosis involving the left posterior tibial veins, and left peroneal veins.   I have personally reviewed and interpreted all radiology studies and my findings are as above.  VENTILATOR SETTINGS: Room air 11/29 SPO2 99%     Cultures   Antimicrobials: Anti-infectives (From admission, onward)   Start     Stop   06/07/19 1000  azithromycin (ZITHROMAX) tablet 500 mg  Status:  Discontinued     06/08/19 0856   06/06/19 1000  remdesivir 100 mg in sodium chloride 0.9 % 250 mL IVPB     06/09/19 0955   06/05/19 1000  cefTRIAXone (ROCEPHIN) 2 g in sodium chloride 0.9 % 100 mL IVPB  Status:  Discontinued     06/08/19 0856   06/05/19 1000  azithromycin (ZITHROMAX) 500 mg in sodium chloride 0.9 % 250 mL IVPB  Status:  Discontinued     06/07/19 0830   06/05/19 0830  remdesivir 200 mg in sodium chloride 0.9 % 250 mL IVPB     06/05/19 1115       Devices    LINES / TUBES:     Continuous Infusions:  sodium chloride Stopped (06/09/19 1104)     Objective: Vitals:   06/14/19 0000 06/14/19 0410 06/14/19 0600 06/14/19 0804  BP: 125/88 (!) 145/84  (!) 155/76  Pulse: 97 81  88  Resp: 19 18  17   Temp: 98.1 F (36.7 C) 97.9 F (36.6 C)  98.3 F (36.8 C)  TempSrc: Oral Oral  Oral  SpO2: 100% 100%  100%  Weight:   98.7 kg   Height:         Intake/Output Summary (Last 24 hours) at 06/14/2019 1103 Last data filed at 06/14/2019 0600 Gross per 24 hour  Intake 1050 ml  Output --  Net 1050 ml   Filed Weights   06/11/19 0500 06/13/19 0600 06/14/19 0600  Weight: 97.4 kg 97.2 kg 98.7 kg   Physical Exam:  General: A/O x4, no acute respiratory distress Eyes: negative scleral hemorrhage, negative anisocoria, negative icterus ENT: Negative Runny nose, negative gingival bleeding, Neck:  Negative scars, masses, torticollis, lymphadenopathy, JVD Lungs: Clear to auscultation bilaterally without wheezes or crackles Cardiovascular: Regular rate and rhythm without murmur gallop or rub normal S1 and S2 Abdomen: MORBIDLY OBESE negative abdominal pain, nondistended, positive soft, bowel sounds, no rebound, no ascites, no appreciable mass Extremities: No significant cyanosis, clubbing, or edema bilateral lower extremities Skin: Negative rashes, lesions, ulcers Psychiatric:  Negative depression, negative anxiety, negative fatigue, negative mania  Central nervous system:  Cranial nerves II  through XII intact, tongue/uvula midline, all extremities muscle strength 5/5, sensation intact throughout, negative dysarthria, negative expressive aphasia, negative receptive aphasia.     Data Reviewed: Care during the described time interval was provided by me .  I have reviewed this patient's available data, including medical history, events of note, physical examination, and all test results as part of my evaluation.   CBC: Recent Labs  Lab 06/09/19 0810 06/10/19 0250 06/11/19 0555 06/12/19 0605 06/13/19 0102  WBC 15.7* 15.6* 14.4* 10.9* 12.3*  NEUTROABS  --  10.5* 9.4* 6.8 8.3*  HGB 12.2 12.1 11.1* 10.5* 10.8*  HCT 40.2 39.5 35.8* 34.2* 35.8*  MCV 72.0* 71.8* 71.6* 72.5* 73.4*  PLT 423* 470* 482* 449* [id number]*   Basic Metabolic Panel: Recent Labs  Lab 06/08/19 0518 06/09/19 0810 06/10/19 0250 06/11/19 0555 06/12/19 0605  06/13/19 0102 06/14/19 0047  NA 136 138 136 138 139 138  --   K 3.4* 3.8 3.7 3.4* 4.2 4.4  --   CL 95* 94* 96* 99 103 101  --   CO2 27 30 27 27 28 26   --   GLUCOSE 88 99 69* 87 92 84  --   BUN 33* 66* 60* 46* 38* 36*  --   CREATININE 1.38* 1.62* 1.17* 1.16* 1.04* 1.14*  --   CALCIUM 8.3* 8.4* 8.9 9.1 9.0 9.4  --   MG 1.9 2.2 2.3 1.8 2.0 2.0  --   PHOS 3.5  --   --   --  3.2 3.9 3.9   GFR: Estimated Creatinine Clearance: 61.8 mL/min (A) (by C-G formula based on SCr of 1.14 mg/dL (H)). Liver Function Tests: Recent Labs  Lab 06/10/19 0250 06/11/19 0555 06/12/19 0605 06/13/19 0102  AST 27 22 15 18   ALT 26 25 20 20   ALKPHOS 79 76 59 57  BILITOT 0.5 0.3 0.4 0.4  PROT 7.5 6.7 6.7 7.0  ALBUMIN 2.8* 2.5* 2.4* 2.6*   No results for input(s): LIPASE, AMYLASE in the last 168 hours. No results for input(s): AMMONIA in the last 168 hours. Coagulation Profile: No results for input(s): INR, PROTIME in the last 168 hours. Cardiac Enzymes: No results for input(s): CKTOTAL, CKMB, CKMBINDEX, TROPONINI in the last 168 hours. BNP (last 3 results) No results for input(s): PROBNP in the last 8760 hours. HbA1C: No results for input(s): HGBA1C in the last 72 hours. CBG: Recent Labs  Lab 06/13/19 1640 06/13/19 1949 06/13/19 2348 06/14/19 0408 06/14/19 0753  GLUCAP 293* 192* 91 81 96   Lipid Profile: No results for input(s): CHOL, HDL, LDLCALC, TRIG, CHOLHDL, LDLDIRECT in the last 72 hours. Thyroid Function Tests: No results for input(s): TSH, T4TOTAL, FREET4, T3FREE, THYROIDAB in the last 72 hours. Anemia Panel: No results for input(s): VITAMINB12, FOLATE, FERRITIN, TIBC, IRON, RETICCTPCT in the last 72 hours. Urine analysis:    Component Value Date/Time   COLORURINE YELLOW 06/05/2019 0052   APPEARANCEUR CLEAR 06/05/2019 0052   LABSPEC 1.025 06/05/2019 0052   PHURINE 5.0 06/05/2019 0052   GLUCOSEU >=500 (A) 06/05/2019 0052   HGBUR NEGATIVE 06/05/2019 0052   BILIRUBINUR NEGATIVE  06/05/2019 0052   BILIRUBINUR Negative 05/18/2019 1704   KETONESUR 5 (A) 06/05/2019 0052   PROTEINUR 100 (A) 06/05/2019 0052   UROBILINOGEN 0.2 05/18/2019 1704   NITRITE NEGATIVE 06/05/2019 0052   LEUKOCYTESUR MODERATE (A) 06/05/2019 0052   Sepsis Labs: @LABRCNTIP (procalcitonin:4,lacticidven:4)  ) Recent Results (from the past 240 hour(s))  Culture, Urine     Status: Abnormal   Collection  Time: 06/05/19 12:30 AM   Specimen: Urine, Random  Result Value Ref Range Status   Specimen Description URINE, RANDOM  Final   Special Requests NONE  Final   Culture (A)  Final    <10,000 COLONIES/mL INSIGNIFICANT GROWTH Performed at [facility], [address], [place], [place] [address]    Report Status 06/06/2019 FINAL  Final  Culture, blood (routine x 2)     Status: None   Collection Time: 06/05/19 12:57 AM   Specimen: BLOOD RIGHT HAND  Result Value Ref Range Status   Specimen Description BLOOD RIGHT HAND  Final   Special Requests   Final    BOTTLES DRAWN AEROBIC AND ANAEROBIC Blood Culture adequate volume   Culture   Final    NO GROWTH 5 DAYS Performed at [facility], [place] [address], [place], [place] [address]    Report Status 06/10/2019 FINAL  Final  Culture, blood (routine x 2)     Status: None   Collection Time: 06/05/19  1:06 AM   Specimen: BLOOD LEFT FOREARM  Result Value Ref Range Status   Specimen Description BLOOD LEFT FOREARM  Final   Special Requests   Final    BOTTLES DRAWN AEROBIC AND ANAEROBIC Blood Culture adequate volume   Culture   Final    NO GROWTH 5 DAYS Performed at [facility], [place] [address], [place], [place] [address]    Report Status 06/10/2019 FINAL  Final  MRSA PCR Screening     Status: None   Collection Time: 06/05/19  7:03 PM   Specimen: Nasal Mucosa; Nasopharyngeal  Result Value Ref Range Status   MRSA by PCR NEGATIVE NEGATIVE Final    Comment:        The GeneXpert MRSA Assay (FDA approved for NASAL specimens only), is one  component of a comprehensive MRSA colonization surveillance program. It is not intended to diagnose MRSA infection nor to guide or monitor treatment for MRSA infections. Performed at [facility], [place] [address] [place], [place] [address]          Radiology Studies: No results found.      Scheduled Meds:  amphetamine-dextroamphetamine  50 mg Oral Q breakfast   apixaban  10 mg Oral BID   Followed by   Derrill Memo ON 06/18/2019] apixaban  5 mg Oral BID   aspirin EC  81 mg Oral Daily   Chlorhexidine Gluconate Cloth  6 each Topical Daily   cholecalciferol  1,000 Units Oral Daily   dexamethasone (DECADRON) injection  6 mg Intravenous Q24H   famotidine  20 mg Oral BID   insulin aspart  0-20 Units Subcutaneous Q4H   insulin detemir  7 Units Subcutaneous BID   levothyroxine  224 mcg Oral QAC breakfast   linagliptin  5 mg Oral Daily   And   metFORMIN  500 mg Oral BID WC   mouth rinse  15 mL Mouth Rinse BID   pneumococcal 23 valent vaccine  0.5 mL Intramuscular Tomorrow-1000   sodium chloride flush  10-40 mL Intracatheter Q12H   sodium chloride flush  3 mL Intravenous Q12H   traZODone  100 mg Oral QHS   Vilazodone HCl  40 mg Oral QHS   vitamin C  250 mg Oral Daily   zinc sulfate  220 mg Oral Daily   Continuous Infusions:  sodium chloride Stopped (06/09/19 1104)     LOS: 10 days   The patient is critically ill with multiple  organ systems failure and requires high complexity decision making for assessment and support, frequent evaluation and titration of therapies, application of advanced monitoring technologies and extensive interpretation of multiple databases. Critical Care Time devoted to patient care services described in this note  Time spent: 40 minutes     Aariel Ems, Geraldo Docker, MD [facility] Pager [phone]  If 7PM-7AM, please contact night-coverage [email or web address] Password [email or web address] 06/14/2019, 11:03 AM

## 2019-06-14 NOTE — TOC Progression Note (Signed)
Transition of Care [facility]) - Progression Note    Patient Details  Name: MYLEENA FARLEIGH MRN: [id number] Date of Birth: [birth year]  Transition of Care [facility]) CM/SW [place], [place] Phone Number: [phone] 06/14/2019, 3:34 PM  Clinical Narrative:     Patient currently has no bed offers, CSW Reached out to [facility] they report they will review chart and let CSW know if they can accept. CSW confirmed with insurance that patient is not managed by [place] therefore insurance Josem Kaufmann will be initiated by SNF. Pending bed offers at this time.    Expected Discharge Plan: Skilled Nursing Facility Barriers to Discharge: Insurance Authorization  Expected Discharge Plan and Services Expected Discharge Plan: [place]   Discharge Planning Services: CM Consult Post Acute Care Choice: [place] Living arrangements for the past 2 months: Single Family Home                 DME Arranged: N/A         HH Arranged: NA HH Agency: NA         Social Determinants of Health (SDOH) Interventions    Readmission Risk Interventions No flowsheet data found.

## 2019-06-15 LAB — CBC WITH DIFFERENTIAL/PLATELET
Abs Immature Granulocytes: 0.18 10*3/uL — ABNORMAL HIGH (ref 0.00–0.07)
Basophils Absolute: 0.1 10*3/uL (ref 0.0–0.1)
Basophils Relative: 1 %
Eosinophils Absolute: 0.1 10*3/uL (ref 0.0–0.5)
Eosinophils Relative: 1 %
HCT: 35 % — ABNORMAL LOW (ref 36.0–46.0)
Hemoglobin: 10.8 g/dL — ABNORMAL LOW (ref 12.0–15.0)
Immature Granulocytes: 2 %
Lymphocytes Relative: 19 %
Lymphs Abs: 2 10*3/uL (ref 0.7–4.0)
MCH: 22.3 pg — ABNORMAL LOW (ref 26.0–34.0)
MCHC: 30.9 g/dL (ref 30.0–36.0)
MCV: 72.3 fL — ABNORMAL LOW (ref 80.0–100.0)
Monocytes Absolute: 1.1 10*3/uL — ABNORMAL HIGH (ref 0.1–1.0)
Monocytes Relative: 11 %
Neutro Abs: 7 10*3/uL (ref 1.7–7.7)
Neutrophils Relative %: 66 %
Platelets: 603 10*3/uL — ABNORMAL HIGH (ref 150–400)
RBC: 4.84 MIL/uL (ref 3.87–5.11)
RDW: 17.9 % — ABNORMAL HIGH (ref 11.5–15.5)
WBC: 10.4 10*3/uL (ref 4.0–10.5)
nRBC: 0 % (ref 0.0–0.2)

## 2019-06-15 LAB — C-REACTIVE PROTEIN: CRP: <0.8 mg/dL (ref <1.0)

## 2019-06-15 LAB — COMPREHENSIVE METABOLIC PANEL
ALT: 22 U/L (ref 0–44)
AST: 21 U/L (ref 15–41)
Albumin: 2.8 g/dL — ABNORMAL LOW (ref 3.5–5.0)
Alkaline Phosphatase: 48 U/L (ref 38–126)
Anion gap: 10 (ref 5–15)
BUN: 20 mg/dL (ref 6–20)
CO2: 27 mmol/L (ref 22–32)
Calcium: 9.2 mg/dL (ref 8.9–10.3)
Chloride: 99 mmol/L (ref 98–111)
Creatinine, Ser: 0.97 mg/dL (ref 0.44–1.00)
GFR calc Af Amer: >60 mL/min (ref >60)
GFR calc non Af Amer: >60 mL/min (ref >60)
Glucose, Bld: 94 mg/dL (ref 70–99)
Potassium: 4.4 mmol/L (ref 3.5–5.1)
Sodium: 136 mmol/L (ref 135–145)
Total Bilirubin: 0.5 mg/dL (ref 0.3–1.2)
Total Protein: 7.2 g/dL (ref 6.5–8.1)

## 2019-06-15 LAB — GLUCOSE, CAPILLARY
Glucose-Capillary: 99 mg/dL (ref 70–99)
Glucose-Capillary: 87 mg/dL (ref 70–99)
Glucose-Capillary: 132 mg/dL — ABNORMAL HIGH (ref 70–99)
Glucose-Capillary: 122 mg/dL — ABNORMAL HIGH (ref 70–99)
Glucose-Capillary: 130 mg/dL — ABNORMAL HIGH (ref 70–99)

## 2019-06-15 LAB — FERRITIN: Ferritin: 61 ng/mL (ref 11–307)

## 2019-06-15 LAB — LACTATE DEHYDROGENASE: LDH: 217 U/L — ABNORMAL HIGH (ref 98–192)

## 2019-06-15 LAB — MAGNESIUM: Magnesium: 1.6 mg/dL — ABNORMAL LOW (ref 1.7–2.4)

## 2019-06-15 LAB — D-DIMER, QUANTITATIVE: D-Dimer, Quant: 2.81 ug/mL-FEU — ABNORMAL HIGH (ref 0.00–0.50)

## 2019-06-15 LAB — PHOSPHORUS: Phosphorus: 3.4 mg/dL (ref 2.5–4.6)

## 2019-06-15 MED ORDER — MAGNESIUM SULFATE 2 GM/50ML IV SOLN
2.0000 g | Freq: Once | INTRAVENOUS | Status: AC
  Administered 2019-06-15: 2 g via INTRAVENOUS
  Filled 2019-06-15: qty 50

## 2019-06-15 MED ORDER — INSULIN ASPART 100 UNIT/ML ~~LOC~~ SOLN: 0.0000 [IU] | Freq: Three times a day (TID) | SUBCUTANEOUS | Status: DC

## 2019-06-15 MED ORDER — INSULIN ASPART 100 UNIT/ML ~~LOC~~ SOLN: 0.0000 [IU] | Freq: Every day | SUBCUTANEOUS | Status: DC

## 2019-06-15 NOTE — Progress Notes (Signed)
Physical Therapy Treatment Patient Details Name: Brittany Rubio MRN: [id number] DOB: 1960/07/24 Today's Date: 06/15/2019    History of Present Illness Pt is a 58 y/o female with PMHx including DM, HTN, anxiety, depression, breast CA, anemia admitted with severe hypoxic respiratory failure due to covid 19. Pt initially admitted to ICU for heated high flow, found to have DVT during admission and started on heparin.     PT Comments    Patient no longer on oxygen. Continues to have slow processing during activities. Completed Berg Balance Assessment with pt scoring 43/56 with <45 indicative of high fall risk (nearly 100% in next 6 months). She continues to lose her balance with walking and head turns (simulating looking for an item in a store, and when trying to look which way to turn to go to her husband's hospital room to visit). Husband feels strongly she is not safe to go home alone.    Follow Up Recommendations  SNF     Equipment Recommendations  None recommended by PT    Recommendations for Other Services       Precautions / Restrictions Precautions Precautions: Fall Restrictions Weight Bearing Restrictions: No    Mobility  Bed Mobility                  Transfers Overall transfer level: Needs assistance Equipment used: None Transfers: Sit to/from Stand;Stand Pivot Transfers Sit to Stand: Supervision         General transfer comment: decr awareness of monitors/lines  Ambulation/Gait Ambulation/Gait assistance: Min guard;Min assist Gait Distance (Feet): 300 Feet Assistive device: None Gait Pattern/deviations: Staggering left;Staggering right;Step-through pattern;Decreased stride length;Drifts right/left Gait velocity: decreased   General Gait Details: patient with losses of balance with head turns (including when looking which way to turn to go to visit her husband's room); required assist to recover   Stairs             Wheelchair Mobility     Modified Rankin (Stroke Patients Only)       Balance Overall balance assessment: Mild deficits observed, not formally tested Sitting-balance support: Feet unsupported Sitting balance-Leahy Scale: Good       Standing balance-Leahy Scale: Fair                   Standardized Balance Assessment Standardized Balance Assessment : Berg Balance Test;Dynamic Gait Index Berg Balance Test Sit to Stand: Able to stand  independently using hands Standing Unsupported: Able to stand safely 2 minutes Sitting with Back Unsupported but Feet Supported on Floor or Stool: Able to sit safely and securely 2 minutes Stand to Sit: Sits safely with minimal use of hands Transfers: Able to transfer with verbal cueing and /or supervision Standing Unsupported with Eyes Closed: Able to stand 10 seconds with supervision Standing Ubsupported with Feet Together: Able to place feet together independently and stand 1 minute safely From Standing, Reach Forward with Outstretched Arm: Can reach confidently >25 cm (10") From Standing Position, Pick up Object from Floor: Able to pick up shoe safely and easily From Standing Position, Turn to Look Behind Over each Shoulder: Looks behind from both sides and weight shifts well Turn 360 Degrees: Able to turn 360 degrees safely but slowly Standing Unsupported, Alternately Place Feet on Step/Stool: Able to complete >2 steps/needs minimal assist Standing Unsupported, One Foot in Front: Able to plae foot ahead of the other independently and hold 30 seconds Standing on One Leg: Tries to lift leg/unable to hold 3 seconds but  remains standing independently Total Score: 43 Dynamic Gait Index Level Surface: Mild Impairment Change in Gait Speed: Mild Impairment Gait with Horizontal Head Turns: Severe Impairment Gait with Vertical Head Turns: Mild Impairment Gait and Pivot Turn: Mild Impairment      Cognition Arousal/Alertness: Awake/alert Behavior During Therapy: WFL  for tasks assessed/performed Overall Cognitive Status: Impaired/Different from baseline Area of Impairment: Attention                   Current Attention Level: Selective           General Comments: pt required incr time for processing at times      Exercises      General Comments General comments (skin integrity, edema, etc.): With patient permission, later spoke with husband re: pt's progress and he is concerned with her cognition and returning home alone (he is still hospitalized)      Pertinent Vitals/Pain Pain Assessment: No/denies pain    Home Living                      Prior Function            PT Goals (current goals can now be found in the care plan section) Acute Rehab PT Goals Patient Stated Goal: return home and return to work Time For Goal Achievement: 06/23/19 Potential to Achieve Goals: Good Progress towards PT goals: Progressing toward goals    Frequency    Min 2X/week      PT Plan Discharge plan needs to be updated    Co-evaluation              AM-PAC PT "6 Clicks" Mobility   Outcome Measure  Help needed turning from your back to your side while in a flat bed without using bedrails?: None Help needed moving from lying on your back to sitting on the side of a flat bed without using bedrails?: None Help needed moving to and from a bed to a chair (including a wheelchair)?: A Little Help needed standing up from a chair using your arms (e.g., wheelchair or bedside chair)?: A Little Help needed to walk in hospital room?: A Little Help needed climbing 3-5 steps with a railing? : A Lot 6 Click Score: 19    End of Session   Activity Tolerance: Patient tolerated treatment well Patient left: in chair;with call bell/phone within reach   PT Visit Diagnosis: Unsteadiness on feet (R26.81);Difficulty in walking, not elsewhere classified (R26.2)     Time: 1501-1530 PT Time Calculation (min) (ACUTE ONLY): 29 min  Charges:   $Gait Training: 8-22 mins $Therapeutic Activity: 8-22 mins                      Barry Brunner, PT Pager [phone]    Brittany Rubio 06/15/2019, 5:59 PM

## 2019-06-15 NOTE — Progress Notes (Signed)
Pt c/o headache and request Stadol nasal spray per prn order, given per emar

## 2019-06-15 NOTE — TOC Progression Note (Signed)
Transition of Care [facility]) - Progression Note    Patient Details  Name: Brittany Rubio MRN: [id number] Date of Birth: 25-Apr-1961  [facility] CM/SW Contact  Joaquin Courts, RN Phone Number: 06/15/2019, 12:52 PM  Clinical Narrative:   CM spoke with patient over the telephone. CM discussed discharge  Disposition. CM presented bed offers and explained that one of the patient's preferred facilities ([facility]) does not accept her insurance and will not be able to extend a bed offer. Patient selects [facility]. [facility] place rep notified of choice and will initiate insurance auth. CM will await confirmation of insurance and coordinate dc once Josem Kaufmann is received.     Expected Discharge Plan: Skilled Nursing Facility Barriers to Discharge: Insurance Authorization  Expected Discharge Plan and Services Expected Discharge Plan: [place]   Discharge Planning Services: CM Consult Post Acute Care Choice: [place] Living arrangements for the past 2 months: Single Family Home Expected Discharge Date: 06/15/19               DME Arranged: N/A         HH Arranged: NA HH Agency: NA         Social Determinants of Health (SDOH) Interventions    Readmission Risk Interventions No flowsheet data found.

## 2019-06-15 NOTE — Progress Notes (Signed)
Phone report received from Gus Height, RN. Pt arrived to 319 via wheelchair. Up ad lib in room on RA. O2 [place] cannula at HS available in room. No complaint of SOB or pain. Plan to discharge to rehab if insurance approved.

## 2019-06-15 NOTE — Progress Notes (Signed)
Spoke with husband and updated him on pt's status, all questions answered and no concerns at this time

## 2019-06-15 NOTE — Progress Notes (Signed)
PROGRESS NOTE  Brittany Rubio  [id number] DOB: 06/29/1961 DOA: 06/04/2019 PCP: Glendale Chard, MD   Brief Narrative: Brittany Rubio is a 58 y.o. female with a history of T2DM, OSA on CPAP, HTN, HLD, ADD/anxiety/depression, thyroid CA s/p XRT who presented to the ED 11/19 with severe respiratory distress having been diagnosed with covid-19 on 11/11. She was admitted to the ICU on heated high flow oxygen. Bilateral lower extremity DVT's were diagnosed for which heparin was started and ultimately transitioned to eliquis. Hypoxia improved and she was transferred to the floor 11/24.   Assessment & Plan: Principal Problem:   Pneumonia due to COVID-19 virus Active Problems:   Hypertension   AKI (acute kidney injury) ([place])   Lobar pneumonia (HCC)   Type 2 diabetes mellitus with microalbuminuria, without long-term current use of insulin (HCC)   Acute on chronic respiratory failure with hypoxia (HCC)   Cancer of thyroid gland (HCC)   Severe obstructive sleep apnea-hypopnea syndrome   Severe sepsis (HCC)   COVID-19 virus infection   ADD (attention deficit disorder)   Depression   Anxiety   Acute respiratory failure with hypoxia (HCC)   Acute deep vein thrombosis (DVT) of calf muscle vein of left lower extremity (HCC)   Acute deep vein thrombosis (DVT) of calf muscle vein of right lower extremity (HCC)   OSA (obstructive sleep apnea)   Obesity hypoventilation syndrome ([place])   Essential hypertension   Diabetes mellitus type 2, uncontrolled, with complications ([place])  Acute hypoxic respiratory failure due to covid-19 pneumonia: Improving.  - Continue to wean supplemental oxygen as able.  - Completed remdesivir x5 days and steroids x10 days, will stop now. CRP undetectable as of 11/29.   Acute bilateral distal lower extremity DVTs: Main risk factor for DVT was ICU admission and covid-19 infection. Note hx of CA is felt to be in remission. - Continue eliquis for 3-6 months for these  provoked DVTs. Hgb stable at 10.8, will need recheck at follow up.  HTN:  - Continue monitoring, normotensive while holding home medications.  OSA, suspected OHS:  - Continue CPAP as outpatient, follows up with Dr. Brett Fairy.  T2DM, uncontrolled with hyperglycemia: HbA1c 8.7% - Continue basal-bolus insulin for now - Needs PCP follow up. Restarted formulary for jan[employer].   Hypothyroidism s/p XRT for thyroid CA:  - Continue synthroid  Anxiety, depression, ADD:  - Continue home medications including adderall  Obesity: BMI 36. Noted.   GERD:  - Continue acid suppression  History of migraine:  - Continue prn stadol  DVT prophylaxis: Eliquis Code Status: Full Family Communication: Husband also admitted, spoke with him in person. Disposition Plan: SNF once insurance authorization obtained.  Consultants:   PCCM  Procedures:   None  Antimicrobials:  Remdesivir 11/20 - 11/24  Azithromycin 11/20 - 11/22  Ceftriaxone 11/20 - 11/22  Subjective: Feels dyspnea is significantly improved. No chest pain. No leg swelling or pain. She's having some scattered ecchymoses but doesn't report epistaxis.  Objective: Vitals:   06/15/19 0540 06/15/19 0610 06/15/19 0757 06/15/19 1133  BP:   (!) 157/83 126/80  Pulse: 88 84    Resp: (!) 34 19 18 18   Temp:   98.3 F (36.8 C) 98.6 F (37 C)  TempSrc:   Oral Oral  SpO2: 99% 98%    Weight:      Height:        Intake/Output Summary (Last 24 hours) at 06/15/2019 1427 Last data filed at 06/15/2019 1404 Gross per 24 hour  Intake 993 ml  Output 250 ml  Net 743 ml   Filed Weights   06/11/19 0500 06/13/19 0600 06/14/19 0600  Weight: 97.4 kg 97.2 kg 98.7 kg    Gen: 58 y.o. female in no distress  Pulm: Non-labored breathing. Clear to auscultation bilaterally.  CV: Regular rate and rhythm. No murmur, rub, or gallop. No JVD, no pedal edema. GI: Abdomen soft, non-tender, non-distended, with normoactive bowel sounds. No organomegaly or  masses felt. Ext: Warm, no deformities Skin: Scattered self-contained ecchymoses on left arm. No other rashes, lesions or ulcers Neuro: Alert and oriented. No focal neurological deficits. Psych: Judgement and insight appear normal. Mood & affect appropriate.   Data Reviewed: I have personally reviewed following labs and imaging studies  CBC: Recent Labs  Lab 06/10/19 0250 06/11/19 0555 06/12/19 0605 06/13/19 0102 06/15/19 0234  WBC 15.6* 14.4* 10.9* 12.3* 10.4  NEUTROABS 10.5* 9.4* 6.8 8.3* 7.0  HGB 12.1 11.1* 10.5* 10.8* 10.8*  HCT 39.5 35.8* 34.2* 35.8* 35.0*  MCV 71.8* 71.6* 72.5* 73.4* 72.3*  PLT 470* 482* 449* 521* [id number]*   Basic Metabolic Panel: Recent Labs  Lab 06/10/19 0250 06/11/19 0555 06/12/19 0605 06/13/19 0102 06/14/19 0047 06/15/19 0234  NA 136 138 139 138  --  136  K 3.7 3.4* 4.2 4.4  --  4.4  CL 96* 99 103 101  --  99  CO2 27 27 28 26   --  27  GLUCOSE 69* 87 92 84  --  94  BUN 60* 46* 38* 36*  --  20  CREATININE 1.17* 1.16* 1.04* 1.14*  --  0.97  CALCIUM 8.9 9.1 9.0 9.4  --  9.2  MG 2.3 1.8 2.0 2.0  --  1.6*  PHOS  --   --  3.2 3.9 3.9 3.4   GFR: Estimated Creatinine Clearance: 72.7 mL/min (by C-G formula based on SCr of 0.97 mg/dL). Liver Function Tests: Recent Labs  Lab 06/10/19 0250 06/11/19 0555 06/12/19 0605 06/13/19 0102 06/15/19 0234  AST 27 22 15 18 21   ALT 26 25 20 20 22   ALKPHOS 79 76 59 57 48  BILITOT 0.5 0.3 0.4 0.4 0.5  PROT 7.5 6.7 6.7 7.0 7.2  ALBUMIN 2.8* 2.5* 2.4* 2.6* 2.8*   No results for input(s): LIPASE, AMYLASE in the last 168 hours. No results for input(s): AMMONIA in the last 168 hours. Coagulation Profile: No results for input(s): INR, PROTIME in the last 168 hours. Cardiac Enzymes: No results for input(s): CKTOTAL, CKMB, CKMBINDEX, TROPONINI in the last 168 hours. BNP (last 3 results) No results for input(s): PROBNP in the last 8760 hours. HbA1C: No results for input(s): HGBA1C in the last 72 hours. CBG:  Recent Labs  Lab 06/14/19 2003 06/14/19 2344 06/15/19 0353 06/15/19 0749 06/15/19 1131  GLUCAP 101* 107* 99 87 132*   Lipid Profile: No results for input(s): CHOL, HDL, LDLCALC, TRIG, CHOLHDL, LDLDIRECT in the last 72 hours. Thyroid Function Tests: No results for input(s): TSH, T4TOTAL, FREET4, T3FREE, THYROIDAB in the last 72 hours. Anemia Panel: Recent Labs    06/15/19 0234  FERRITIN 61   Urine analysis:    Component Value Date/Time   COLORURINE YELLOW 06/05/2019 0052   APPEARANCEUR CLEAR 06/05/2019 0052   LABSPEC 1.025 06/05/2019 0052   PHURINE 5.0 06/05/2019 0052   GLUCOSEU >=500 (A) 06/05/2019 0052   HGBUR NEGATIVE 06/05/2019 [place] NEGATIVE 06/05/2019 0052   BILIRUBINUR Negative 05/18/2019 1704   KETONESUR 5 (A) 06/05/2019 0052  PROTEINUR 100 (A) 06/05/2019 0052   UROBILINOGEN 0.2 05/18/2019 1704   NITRITE NEGATIVE 06/05/2019 0052   LEUKOCYTESUR MODERATE (A) 06/05/2019 0052   Recent Results (from the past 240 hour(s))  MRSA PCR Screening     Status: None   Collection Time: 06/05/19  7:03 PM   Specimen: Nasal Mucosa; Nasopharyngeal  Result Value Ref Range Status   MRSA by PCR NEGATIVE NEGATIVE Final    Comment:        The GeneXpert MRSA Assay (FDA approved for NASAL specimens only), is one component of a comprehensive MRSA colonization surveillance program. It is not intended to diagnose MRSA infection nor to guide or monitor treatment for MRSA infections. Performed at [facility], [place] [address] [place], [place] [address]       Radiology Studies: No results found.  Scheduled Meds: . amphetamine-dextroamphetamine  50 mg Oral Q breakfast  . apixaban  10 mg Oral BID   Followed by  . [START ON 06/18/2019] apixaban  5 mg Oral BID  . aspirin EC  81 mg Oral Daily  . Chlorhexidine Gluconate Cloth  6 each Topical Daily  . cholecalciferol  1,000 Units Oral Daily  . famotidine  20 mg Oral BID  . insulin aspart  0-20 Units  Subcutaneous Q4H  . insulin detemir  7 Units Subcutaneous BID  . levothyroxine  224 mcg Oral QAC breakfast  . linagliptin  5 mg Oral Daily   And  . metFORMIN  500 mg Oral BID WC  . mouth rinse  15 mL Mouth Rinse BID  . sodium chloride flush  10-40 mL Intracatheter Q12H  . sodium chloride flush  3 mL Intravenous Q12H  . traZODone  100 mg Oral QHS  . Vilazodone HCl  40 mg Oral QHS  . vitamin C  250 mg Oral Daily  . zinc sulfate  220 mg Oral Daily   Continuous Infusions: . sodium chloride Stopped (06/09/19 1104)     LOS: 11 days   Time spent: 25 minutes.  Patrecia Pour, MD [facility] [email or web address] 06/15/2019, 2:27 PM

## 2019-06-16 LAB — GLUCOSE, CAPILLARY
Glucose-Capillary: 99 mg/dL (ref 70–99)
Glucose-Capillary: 118 mg/dL — ABNORMAL HIGH (ref 70–99)

## 2019-06-16 MED ORDER — DIAZEPAM 5 MG PO TABS: 5.0000 mg | ORAL_TABLET | Freq: Every day | ORAL | 0 refills | Status: DC | PRN

## 2019-06-16 MED ORDER — BUTORPHANOL TARTRATE 10 MG/ML NA SOLN: 1.0000 | Freq: Three times a day (TID) | NASAL | 0 refills | Status: AC | PRN

## 2019-06-16 MED ORDER — APIXABAN 5 MG PO TABS: 5.0000 mg | ORAL_TABLET | Freq: Two times a day (BID) | ORAL | Status: DC

## 2019-06-16 MED ORDER — AMPHETAMINE-DEXTROAMPHET ER 25 MG PO CP24: 50.0000 mg | ORAL_CAPSULE | Freq: Every day | ORAL | 0 refills | Status: DC

## 2019-06-16 NOTE — Progress Notes (Signed)
Pt to rehabilitation facility, [facility], today via [place]. Phone report given to Carilyn Goodpasture, RN. Midline removed site is clean, dry and intact. Shower taken, no complaint of discomfort or shortness of breath, room air. Signed prescriptions in chart.

## 2019-06-16 NOTE — TOC Progression Note (Signed)
Transition of Care [facility]) - Progression Note    Patient Details  Name: Brittany Rubio MRN: [id number] Date of Birth: [birth year]  [facility]) CM/SW Contact  Joaquin Courts, RN Phone Number: 06/16/2019, 12:50 PM  Clinical Narrative: Bed availability at [facility] confirmed for dc today (room 704). Patient will transport by [facility]. Bedside RN please call report to [phone]. Please place facesheet, medical necessity form, and any written prescriptions into discharge envelope for transport.         Expected Discharge Plan: [place] Barriers to Discharge: No Barriers Identified  Expected Discharge Plan and Services Expected Discharge Plan: [place]   Discharge Planning Services: CM Consult Post Acute Care Choice: [place] Living arrangements for the past 2 months: Single Family Home Expected Discharge Date: 06/16/19               DME Arranged: N/A         HH Arranged: NA HH Agency: NA         Social Determinants of Health (SDOH) Interventions    Readmission Risk Interventions No flowsheet data found.

## 2019-06-16 NOTE — Discharge Summary (Signed)
Physician Discharge Summary  Brittany Rubio [id number] DOB: [birth year] DOA: 06/04/2019  PCP: Glendale Chard, MD  Admit date: 06/04/2019 Discharge date: 06/16/2019  Admitted From: Home Disposition: SNF   Recommendations for Outpatient Follow-up:  1. Follow up with PCP in 1-2 weeks 2. Continue diabetes management, HbA1c 8.7% 3. Please obtain CMP/CBC in one week  Home Health: N/A Equipment/Devices: Per SNF. Can continue 2L O2 qHS while still covid-positive, then return to CPAP qHS Discharge Condition: Stable CODE STATUS: Full Diet recommendation: Heart healthy, carb-modified  Brief/Interim Summary: Brittany Rubio is a 58 y.o. female with a history of T2DM, OSA on CPAP, HTN, HLD, ADD/anxiety/depression, thyroid CA s/p XRT who presented to the ED 11/19 with severe respiratory distress having been diagnosed with covid-19 on 11/11. She was admitted to the ICU on heated high flow oxygen. Bilateral lower extremity DVT's were diagnosed for which heparin was started and ultimately transitioned to eliquis. Hypoxia improved and she was transferred to the floor 11/24. With continued treatment with remdesivir and steroids, inflammatory marker normalized. She has completed treatment as of time of discharge. She remains a significant fall risk and requires ongoing rehabilitation at SNF at discharge.   Discharge Diagnoses:  Principal Problem:   Pneumonia due to COVID-19 virus Active Problems:   Hypertension   AKI (acute kidney injury) ([place])   Lobar pneumonia (HCC)   Type 2 diabetes mellitus with microalbuminuria, without long-term current use of insulin (HCC)   Acute on chronic respiratory failure with hypoxia (HCC)   Cancer of thyroid gland (HCC)   Severe obstructive sleep apnea-hypopnea syndrome   Severe sepsis (HCC)   COVID-19 virus infection   ADD (attention deficit disorder)   Depression   Anxiety   Acute respiratory failure with hypoxia (HCC)   Acute deep vein thrombosis (DVT) of  calf muscle vein of left lower extremity (HCC)   Acute deep vein thrombosis (DVT) of calf muscle vein of right lower extremity (HCC)   OSA (obstructive sleep apnea)   Obesity hypoventilation syndrome ([place])   Essential hypertension   Diabetes mellitus type 2, uncontrolled, with complications ([place])  Acute hypoxic respiratory failure due to covid-19 pneumonia: Improving, diurnal hypoxia has resolved, continuing nocturnal O2 w/history of OSA.  - Completed remdesivir x5 days and steroids x10 days, will stop now. CRP undetectable as of 11/29.   Acute bilateral distal lower extremity DVTs: Main risk factor for DVT was ICU admission and covid-19 infection. Note hx of CA is felt to be in remission. - Continue eliquis for 3-6 months for these provoked DVTs. Hgb stable at 10.8, will need recheck at follow up.  HTN:  - Will restart home medications  OSA, suspected OHS:  - Continue CPAP as outpatient, follows up with Dr. Brett Fairy.  T2DM, uncontrolled with steroid-induced hyperglycemia: HbA1c 8.7% - Basal-bolus insulin given while admitted, with significant improvement once steroids tapered. Will return to home medications including farxiga and janumet as below.  - Needs PCP follow up. Restarted formulary for janumet.   Hypothyroidism s/p XRT for thyroid CA:   - Continue synthroid  Anxiety, depression, ADD:  - Continue home medications including adderall  Obesity: BMI 36. Noted.   GERD:  - Continue acid suppression  History of migraine:  - Continue prn stadol  Discharge Instructions  Allergies as of 06/16/2019      Reactions   Crab [shellfish Allergy] Hives, Swelling   "Only when I eat too many crab legs" Can tolerate CT scans with contrast-does not need premeds  Medication List    STOP taking these medications   amphetamine-dextroamphetamine 10 MG tablet Commonly known as: ADDERALL   amphetamine-dextroamphetamine 20 MG 24 hr capsule Commonly known as: ADDERALL  XR Replaced by: amphetamine-dextroamphetamine 25 MG 24 hr capsule   amphetamine-dextroamphetamine 30 MG 24 hr capsule Commonly known as: ADDERALL XR   azithromycin 250 MG tablet Commonly known as: Zithromax     TAKE these medications   albuterol 108 (90 Base) MCG/ACT inhaler Commonly known as: VENTOLIN HFA Inhale 2 puffs into the lungs every 6 (six) hours as needed for wheezing or shortness of breath.   amphetamine-dextroamphetamine 25 MG 24 hr capsule Commonly known as: ADDERALL XR Take 2 capsules by mouth daily. Take with 30 mg to equal 50 mg daily Replaces: amphetamine-dextroamphetamine 20 MG 24 hr capsule   apixaban 5 MG Tabs tablet Commonly known as: ELIQUIS Take 1 tablet (5 mg total) by mouth 2 (two) times daily. Start taking on: June 18, 2019   aspirin EC 81 MG tablet Take 81 mg by mouth daily.   benzonatate 100 MG capsule Commonly known as: Tessalon Perles Take 1 capsule (100 mg total) by mouth 3 (three) times daily as needed.   butorphanol 10 MG/ML nasal spray Commonly known as: STADOL PLACE 1 SPRAY INTO THE NOSE 3 (THREE) TIMES DAILY AS NEEDED FOR MIGRAINE.   cholecalciferol 25 MCG (1000 UT) tablet Commonly known as: VITAMIN D3 Take 1,000 Units by mouth daily.   diazepam 5 MG tablet Commonly known as: VALIUM Take 1 tablet (5 mg total) by mouth daily as needed. for anxiety What changed:   reasons to take this  additional instructions   diltiazem 240 MG 24 hr capsule Commonly known as: TIAZAC TAKE 1 TABLET BY MOUTH AT BEDTIME FOR BP What changed: See the new instructions.   EpiPen 2-Pak 0.3 mg/0.3 mL Soaj injection Generic drug: EPINEPHrine 0.3 mLs by Subdermal route once as needed. Allergic reaction   esomeprazole 20 MG capsule Commonly known as: NEXIUM Take 20 mg by mouth daily as needed (acid reflux).   Farxiga 10 MG Tabs tablet Generic drug: dapagliflozin propanediol TAKE 1 TABLET BY MOUTH EVERY DAY What changed: how much to take    fexofenadine 180 MG tablet Commonly known as: ALLEGRA Take 180 mg by mouth daily as needed for allergies or rhinitis.   glucose blood test strip Commonly known as: OneTouch Ultra Test twice daily   Janumet 50-1000 MG tablet Generic drug: sitaGLIPtin-metformin TAKE 1 TABLET BY MOUTH TWICE A DAY WITH MEALS   levothyroxine 112 MCG tablet Commonly known as: SYNTHROID Take 224 mcg by mouth every morning.   lisinopril-hydrochlorothiazide 20-25 MG tablet Commonly known as: ZESTORETIC TAKE 1 TABLET BY MOUTH EVERY DAY   Magnesium 500 MG Caps Take 500 mg by mouth daily.   omega-3 acid ethyl esters 1 g capsule Commonly known as: LOVAZA Take 1 g by mouth daily.   onetouch ultrasoft lancets Test twice daily   promethazine 25 MG tablet Commonly known as: PHENERGAN Take 25 mg by mouth every 8 (eight) hours as needed for nausea or vomiting.   ranitidine 150 MG tablet Commonly known as: ZANTAC Take 150-300 mg by mouth See admin instructions. Take 300 mg in the morning and may take an additional 150 mg at night as needed for heartburn   traZODone 50 MG tablet Commonly known as: DESYREL Take 2 tabs po qhs What changed:   how much to take  how to take this  when to take this  additional instructions   Turmeric Curcumin 500 MG Caps Take 500 mg by mouth daily.   Viibryd 40 MG Tabs Generic drug: Vilazodone HCl TAKE 1 TABLET BY MOUTH EVERY DAY WITH FOOD What changed:   how much to take  additional instructions   VITAMIN B-12 PO Take 1 tablet by mouth once a week. Tuesdays   vitamin C 500 MG tablet Commonly known as: ASCORBIC ACID Take 500 mg by mouth daily.            Durable Medical Equipment  (From admission, onward)         Start     Ordered   06/11/19 1604  For home use only DME oxygen  Once    Comments: SATURATION QUALIFICATIONS: (This note is used to comply with regulatory documentation for home oxygen) Patient Saturations on Room Air at Rest =  88-92% Patient Saturations on Room Air while Ambulating =86-90 % Patient Saturations on 2 Liters of oxygen while Ambulating = 96% Please briefly explain why patient needs home oxygen: Patient has shortness of breathe during ambulation and while performing ADLs. She takes 3-5 minutes to recover to baseline 02 of 88-92% on room air after activity -Patient qualifies for home O2 -2 L O2 via [place] at all times.  Titrate to maintain SPO2> 88% -Provide [employer] portable home O2 concentrator "  Question Answer Comment  Length of Need Lifetime   Mode or (Route) Nasal cannula   Frequency Continuous (stationary and portable oxygen unit needed)   Oxygen conserving device Yes   Oxygen delivery system Gas      06/11/19 1606          Contact information for follow-up providers    Glendale Chard, MD. Schedule an appointment as soon as possible for a visit in 1 week(s).   Specialty: Internal Medicine Contact information: [address] [place] [address] [phone]            Contact information for after-discharge care    Destination    [facility] .   Service: Skilled Nursing Contact information: [address] [place] [phone]                 Allergies  Allergen Reactions  . Crab [Shellfish Allergy] Hives and Swelling    "Only when I eat too many crab legs" Can tolerate CT scans with contrast-does not need premeds      Consultations:  PCCM  Procedures/Studies: Dg Chest Port 1 View  Result Date: 06/07/2019 CLINICAL DATA:  History of intubation EXAM: PORTABLE CHEST 1 VIEW COMPARISON:  Three days ago FINDINGS: No endotracheal tube is seen. Patchy bilateral pneumonia with possible mild clearing in the subpleural right lung. Cardiomegaly and vascular pedicle widening. Possible vascular congestion. Low lung volumes. No pneumothorax IMPRESSION: 1. Low volume chest with multifocal pneumonia that is mildly  improved. 2. There is cardiomegaly and possible vascular congestion. Electronically Signed   By: Monte Fantasia M.D.   On: 06/07/2019 07:57   Xr Chest Portable  Result Date: 06/04/2019 CLINICAL DATA:  Shortness of breath.  Coronavirus infection. EXAM: PORTABLE CHEST 1 VIEW COMPARISON:  08/19/2018 FINDINGS: Heart size upper limits of normal. Extensive bilateral patchy pulmonary infiltrates consistent with viral pneumonia. No dense consolidation, collapse or effusion. No significant bone finding. IMPRESSION: Extensive patchy bilateral pulmonary infiltrates consistent with viral pneumonia. Electronically Signed   By: Nelson Chimes M.D.   On: 06/04/2019 19:12   Vas [place] Lower Extremity  Venous (dvt)  Result Date: 06/07/2019  Lower Venous Study Indications: Elevated D-Dimer, Covid positive.  Comparison Study: No prior study on file for comparison Performing Technologist: Sharion Dove RVS  Examination Guidelines: A complete evaluation includes B-mode imaging, spectral Doppler, color Doppler, and power Doppler as needed of all accessible portions of each vessel. Bilateral testing is considered an integral part of a complete examination. Limited examinations for reoccurring indications may be performed as noted.  +---------+---------------+---------+-----------+----------+--------------+ RIGHT    CompressibilityPhasicitySpontaneityPropertiesThrombus Aging +---------+---------------+---------+-----------+----------+--------------+ CFV      Full                                                        +---------+---------------+---------+-----------+----------+--------------+ SFJ      Full                                                        +---------+---------------+---------+-----------+----------+--------------+ FV Prox  Full                                                        +---------+---------------+---------+-----------+----------+--------------+ FV Mid   Full                                                         +---------+---------------+---------+-----------+----------+--------------+ FV DistalFull                                                        +---------+---------------+---------+-----------+----------+--------------+ PFV      Full                                                        +---------+---------------+---------+-----------+----------+--------------+ POP      Full                                                        +---------+---------------+---------+-----------+----------+--------------+ PTV      None                                         Acute          +---------+---------------+---------+-----------+----------+--------------+ PERO     Full                                                        +---------+---------------+---------+-----------+----------+--------------+  Soleal   None                                         Acute          +---------+---------------+---------+-----------+----------+--------------+ Gastroc  None                                         Acute          +---------+---------------+---------+-----------+----------+--------------+   +---------+---------------+---------+-----------+----------+--------------+ LEFT     CompressibilityPhasicitySpontaneityPropertiesThrombus Aging +---------+---------------+---------+-----------+----------+--------------+ CFV      Full           Yes      Yes                                 +---------+---------------+---------+-----------+----------+--------------+ SFJ      Full                                                        +---------+---------------+---------+-----------+----------+--------------+ FV Prox  Full                                                        +---------+---------------+---------+-----------+----------+--------------+ FV Mid   Full                                                         +---------+---------------+---------+-----------+----------+--------------+ FV DistalFull                                                        +---------+---------------+---------+-----------+----------+--------------+ PFV      Full                                                        +---------+---------------+---------+-----------+----------+--------------+ POP      Full           Yes      Yes                                 +---------+---------------+---------+-----------+----------+--------------+ PTV      None                                         Acute          +---------+---------------+---------+-----------+----------+--------------+ PERO  None                                         Acute          +---------+---------------+---------+-----------+----------+--------------+     Summary: Right: Findings consistent with acute deep vein thrombosis involving the right posterior tibial veins, right soleal veins, and right gastrocnemius veins. Left: Findings consistent with acute deep vein thrombosis involving the left posterior tibial veins, and left peroneal veins.  *See table(s) above for measurements and observations. Electronically signed by Servando Snare MD on 06/07/2019 at 10:58:31 AM.    Final      Subjective: Slept poorly. No significant overnight events. Denies shortness of breath or chest pain.  Discharge Exam: Vitals:   06/16/19 0430 06/16/19 0838  BP: 124/85 121/84  Pulse: 80   Resp: 16 20  Temp: 98.4 F (36.9 C) 98.2 F (36.8 C)  SpO2: 98%    General: Pt is alert, awake, not in acute distress Cardiovascular: RRR, S1/S2 +, no rubs, no gallops Respiratory: CTA bilaterally, no wheezing, no rhonchi  Labs: BNP (last 3 results) Recent Labs    06/11/19 0555 06/12/19 0605 06/13/19 0102  BNP 22.1 34.1 [id number]   Basic Metabolic Panel: Recent Labs  Lab 06/10/19 0250 06/11/19 0555 06/12/19 0605 06/13/19 0102 06/14/19 0047 06/15/19 0234   NA 136 138 139 138  --  136  K 3.7 3.4* 4.2 4.4  --  4.4  CL 96* 99 103 101  --  99  CO2 27 27 28 26   --  27  GLUCOSE 69* 87 92 84  --  94  BUN 60* 46* 38* 36*  --  20  CREATININE 1.17* 1.16* 1.04* 1.14*  --  0.97  CALCIUM 8.9 9.1 9.0 9.4  --  9.2  MG 2.3 1.8 2.0 2.0  --  1.6*  PHOS  --   --  3.2 3.9 3.9 3.4   Liver Function Tests: Recent Labs  Lab 06/10/19 0250 06/11/19 0555 06/12/19 0605 06/13/19 0102 06/15/19 0234  AST 27 22 15 18 21   ALT 26 25 20 20 22   ALKPHOS 79 76 59 57 48  BILITOT 0.5 0.3 0.4 0.4 0.5  PROT 7.5 6.7 6.7 7.0 7.2  ALBUMIN 2.8* 2.5* 2.4* 2.6* 2.8*   No results for input(s): LIPASE, AMYLASE in the last 168 hours. No results for input(s): AMMONIA in the last 168 hours. CBC: Recent Labs  Lab 06/10/19 0250 06/11/19 0555 06/12/19 0605 06/13/19 0102 06/15/19 0234  WBC 15.6* 14.4* 10.9* 12.3* 10.4  NEUTROABS 10.5* 9.4* 6.8 8.3* 7.0  HGB 12.1 11.1* 10.5* 10.8* 10.8*  HCT 39.5 35.8* 34.2* 35.8* 35.0*  MCV 71.8* 71.6* 72.5* 73.4* 72.3*  PLT 470* 482* 449* 521* 603*   Cardiac Enzymes: No results for input(s): CKTOTAL, CKMB, CKMBINDEX, TROPONINI in the last 168 hours. BNP: Invalid input(s): POCBNP CBG: Recent Labs  Lab 06/15/19 0749 06/15/19 1131 06/15/19 1647 06/15/19 1945 06/16/19 0836  GLUCAP 87 132* 122* 130* 99   D-Dimer Recent Labs    06/15/19 0234  DDIMER 2.81*   Hgb A1c No results for input(s): HGBA1C in the last 72 hours. Lipid Profile No results for input(s): CHOL, HDL, LDLCALC, TRIG, CHOLHDL, LDLDIRECT in the last 72 hours. Thyroid function studies No results for input(s): TSH, T4TOTAL, T3FREE, THYROIDAB in the last 72 hours.  Invalid input(s): FREET3 Anemia work up [employer]  06/15/19 0234  FERRITIN 61   Urinalysis    Component Value Date/Time   COLORURINE YELLOW 06/05/2019 [place] 06/05/2019 0052   LABSPEC 1.025 06/05/2019 0052   PHURINE 5.0 06/05/2019 0052   GLUCOSEU >=500 (A) 06/05/2019  0052   HGBUR NEGATIVE 06/05/2019 0052   BILIRUBINUR NEGATIVE 06/05/2019 0052   BILIRUBINUR Negative 05/18/2019 1704   KETONESUR 5 (A) 06/05/2019 0052   PROTEINUR 100 (A) 06/05/2019 0052   UROBILINOGEN 0.2 05/18/2019 1704   NITRITE NEGATIVE 06/05/2019 0052   LEUKOCYTESUR MODERATE (A) 06/05/2019 0052    Microbiology No results found for this or any previous visit (from the past 240 hour(s)).  Time coordinating discharge: Approximately 40 minutes  Patrecia Pour, MD  [facility] 06/16/2019, 11:21 AM

## 2019-06-16 NOTE — Progress Notes (Signed)
Occupational Therapy Treatment Patient Details Name: Brittany Rubio MRN: [id number] DOB: 07/29/1960 Today's Date: 06/16/2019    History of present illness Pt is a 58 y/o female with PMHx including DM, HTN, anxiety, depression, breast CA, anemia admitted with severe hypoxic respiratory failure due to covid 19. Pt initially admitted to ICU for heated high flow, found to have DVT during admission and started on heparin.    OT comments  Pt making progress in therapy, able to complete self-care and functional transfer tasks on room air with O2 SATs maintaining in 90s throughout. No reports of SOB. Pt able to ambulate to/from bathroom, complete toileting task as well as grooming/hygiene tasks at the sink. Pt tolerated standing ~2 min at the sink with good tolerance to task. Continued education with pt on breathing exercises noting good understanding and recall from previous sessions.   Follow Up Recommendations  SNF(Spouse does not feel pt is safe to D/C home alone)    Equipment Recommendations  None recommended by OT    Recommendations for Other Services      Precautions / Restrictions Precautions Precautions: Fall Restrictions Weight Bearing Restrictions: No       Mobility Bed Mobility Overal bed mobility: Modified Independent                Transfers Overall transfer level: Needs assistance Equipment used: None Transfers: Sit to/from Stand Sit to Stand: Supervision         General transfer comment: Pt able to ambulate to/from bathroom with supervision, noting 0 instances of LOB throughout    Balance Overall balance assessment: Mild deficits observed, not formally tested   Sitting balance-Leahy Scale: Good       Standing balance-Leahy Scale: Fair                             ADL either performed or assessed with clinical judgement   ADL       Grooming: Supervision/safety;Standing                   Toilet Transfer:  Supervision/safety;Ambulation;Regular Toilet;Grab bars   Toileting- Clothing Manipulation and Hygiene: Supervision/safety;Sit to/from stand       Functional mobility during ADLs: Supervision/safety       Vision       Perception     Praxis      Cognition Arousal/Alertness: Awake/alert Behavior During Therapy: WFL for tasks assessed/performed Overall Cognitive Status: Within Functional Limits for tasks assessed                                          Exercises Exercises: Other exercises Other Exercises Other Exercises: Incentive spirometer x 10 with no cues on technique. Averaging 1246mL Other Exercises: Flutter valve x 10 with no cues on technique. Other Exercises: Pursed lip breathing with shld abd/add x 10, requiring min cues on technique.   Shoulder Instructions       General Comments Continued education with pt on safety strategies, activity modifications, and energy conservation techniques with fair recall from previous session. Continued education with pt on breathing exercises with good recall.     Pertinent Vitals/ Pain       Pain Assessment: No/denies pain  Home Living  Prior Functioning/Environment              Frequency  Min 3X/week        Progress Toward Goals  OT Goals(current goals can now be found in the care plan section)  Progress towards OT goals: Progressing toward goals  Acute Rehab OT Goals Patient Stated Goal: return home and return to work ADL Goals Pt Will Perform Grooming: with modified independence;standing Pt Will Perform Lower Body Bathing: with modified independence;sit to/from stand Pt Will Perform Lower Body Dressing: with modified independence;sit to/from stand Pt Will Transfer to Toilet: with modified independence;ambulating Pt Will Perform Toileting - Clothing Manipulation and hygiene: with modified independence;sit to/from  stand Pt/caregiver will Perform Home Exercise Program: Increased strength;Both right and left upper extremity;With theraband Additional ADL Goal #1: Pt will verbalize at least 3 energy conservation techniques to use during functional task.  Plan Discharge plan needs to be updated    Co-evaluation                 AM-PAC OT "6 Clicks" Daily Activity     Outcome Measure   Help from another person eating meals?: None Help from another person taking care of personal grooming?: A Little Help from another person toileting, which includes using toliet, bedpan, or urinal?: A Little Help from another person bathing (including washing, rinsing, drying)?: A Little Help from another person to put on and taking off regular upper body clothing?: None Help from another person to put on and taking off regular lower body clothing?: A Little 6 Click Score: 20    End of Session    OT Visit Diagnosis: Unsteadiness on feet (R26.81);Muscle weakness (generalized) (M62.81)   Activity Tolerance Patient tolerated treatment well   Patient Left in chair;with call bell/phone within reach   Nurse Communication Mobility status        Time: 1340-1400 OT Time Calculation (min): 20 min  Charges: OT General Charges $OT Visit: 1 Visit OT Treatments $Therapeutic Activity: 8-22 mins  Mauri Brooklyn OTR/L [phone]    Mauri Brooklyn  06/16/2019, 3:01 PM

## 2019-06-16 NOTE — Progress Notes (Signed)
[place] present to take pt via stretcher. All belongings and paper work taken.

## 2019-06-30 ENCOUNTER — Other Ambulatory Visit: Payer: Self-pay | Admitting: Internal Medicine

## 2019-07-02 ENCOUNTER — Other Ambulatory Visit: Payer: Self-pay | Admitting: Family

## 2019-07-02 DIAGNOSIS — U071 COVID-19: Secondary | ICD-10-CM

## 2019-07-20 ENCOUNTER — Ambulatory Visit: Payer: BC Managed Care – PPO | Admitting: Nurse Practitioner

## 2019-07-20 ENCOUNTER — Telehealth (INDEPENDENT_AMBULATORY_CARE_PROVIDER_SITE_OTHER): Payer: BC Managed Care – PPO | Admitting: Nurse Practitioner

## 2019-07-20 ENCOUNTER — Other Ambulatory Visit: Payer: Self-pay

## 2019-07-20 ENCOUNTER — Encounter: Payer: Self-pay | Admitting: Nurse Practitioner

## 2019-07-20 VITALS — Ht 65.0 in | Wt 214.0 lb

## 2019-07-20 DIAGNOSIS — N179 Acute kidney failure, unspecified: Secondary | ICD-10-CM | POA: Diagnosis not present

## 2019-07-20 DIAGNOSIS — Z8616 Personal history of COVID-19: Secondary | ICD-10-CM

## 2019-07-20 DIAGNOSIS — I10 Essential (primary) hypertension: Secondary | ICD-10-CM

## 2019-07-20 DIAGNOSIS — Z09 Encounter for follow-up examination after completed treatment for conditions other than malignant neoplasm: Secondary | ICD-10-CM | POA: Diagnosis not present

## 2019-07-20 NOTE — Progress Notes (Addendum)
Virtual Visit via Failed Video   This visit type was conducted due to national recommendations for restrictions regarding the COVID-19 Pandemic (e.g. social distancing) in an effort to limit this patient's exposure and mitigate transmission in our community.  Due to her co-morbid illnesses, this patient is at least at moderate risk for complications without adequate follow up.  This format is felt to be most appropriate for this patient at this time.  All issues noted in this document were discussed and addressed.  A limited physical exam was performed with this format.    This visit type was conducted due to national recommendations for restrictions regarding the COVID-19 Pandemic (e.g. social distancing) in an effort to limit this patient's exposure and mitigate transmission in our community.  Patients identity confirmed using two different identifiers.  This format is felt to be most appropriate for this patient at this time.  All issues noted in this document were discussed and addressed.  No physical exam was performed (except for noted visual exam findings with Video Visits).    Date:  07/28/2019   ID:  Brittany Rubio, DOB [birth year], MRN [id number]  Patient Location:  Home- spoke with Aura Camps  Provider location:   Office    Chief Complaint:  Follow up after having covid  History of Present Illness:    Brittany Rubio is a 59 y.o. female who presents via video conferencing for a telehealth visit today.    The patient does not have symptoms concerning for COVID-19 infection (fever, chills, cough, or new shortness of breath).   She was admitted to the hospital for covid 11/19 until 12/1.  She was discharged to SNF for 11 days then went to her sisters house for 2 weeks.  While hospitalized she developed bilateral DVT's and is on Eliquis.  She has since 12/26.  She denies shortness of breath when walking. When she went to rehab.    She feels great now, no problems.  She  did not have a time frame to be out of work.  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 School has started back today.  She did not go in today.  She has not gone to be retested.      Past Medical History:  Diagnosis Date  . ADD (attention deficit disorder)   . Anemia   . Anxiety   . Arthritis    "left shoulder" (07/07/2014)  . Back pain   . Breast cancer ([place])    "left"  . Depression   . Fatty liver   . Food allergy    shellfish  . GERD (gastroesophageal reflux disease)    "takes over the counters as needed"  . Hernia, umbilical   . High cholesterol   . Hypertension 11/26/2011   sees Dr. Bryon Lions  . Hypothyroidism   . Joint pain   . Migraines    "maybe once q 3 months" (07/07/2014)  . Multinodular thyroid 2015  . OSA on CPAP   . Personal history of radiation therapy   . Pneumonia    May 2018  . Thyroid cancer ([place])    2015  . Type II diabetes mellitus ([place])   . Vitamin D deficiency    Past Surgical History:  Procedure Laterality Date  . ABDOMINAL HYSTERECTOMY  ? 1997  . BREAST EXCISIONAL BIOPSY Right 2014  . BREAST LUMPECTOMY Left 2009  . BREAST LUMPECTOMY Right 07/2012   "not a mastectomy"  . BREAST REDUCTION  SURGERY Bilateral   . DIAPHRAGM SURGERY  1986   "trauma"  . INCISIONAL HERNIA REPAIR N/A 06/05/2018   Procedure: LAPAROSCOPIC INCISIONAL HERNIA ERAS PATHWAY;  Surgeon: Erroll Luna, MD;  Location: [facility];  Service: General;  Laterality: N/A;  . INSERTION OF MESH N/A 06/05/2018   Procedure: INSERTION OF MESH;  Surgeon: Erroll Luna, MD;  Location: [facility];  Service: General;  Laterality: N/A;  . PARTIAL MASTECTOMY WITH NEEDLE LOCALIZATION  08/12/2012   Procedure: PARTIAL MASTECTOMY WITH NEEDLE LOCALIZATION;  Surgeon: Joyice Faster. Cornett, MD;  Location: [facility];  Service: General;  Laterality: Right;  . REDUCTION MAMMAPLASTY Bilateral 1995  . THYROIDECTOMY Left 07/07/2014   Procedure: LEFT HEMI-THYROIDECTOMY;  Surgeon: Ascencion Dike, MD;   Location: [facility];  Service: ENT;  Laterality: Left;  . THYROIDECTOMY Right 07/08/2014   Procedure: Completion THYROIDECTOMY;  Surgeon: Ascencion Dike, MD;  Location: [facility];  Service: ENT;  Laterality: Right;  . THYROIDECTOMY, PARTIAL Left 07/07/2014   hemi     Current Meds  Medication Sig  . albuterol (VENTOLIN HFA) 108 (90 Base) MCG/ACT inhaler Inhale 2 puffs into the lungs every 6 (six) hours as needed for wheezing or shortness of breath.  . amphetamine-dextroamphetamine (ADDERALL XR) 25 MG 24 hr capsule Take 2 capsules by mouth daily. Take with 30 mg to equal 50 mg daily  . apixaban (ELIQUIS) 5 MG TABS tablet Take 1 tablet (5 mg total) by mouth 2 (two) times daily.  . cholecalciferol (VITAMIN D3) 25 MCG (1000 UT) tablet Take 1,000 Units by mouth daily.  . Cyanocobalamin (VITAMIN B-12 PO) Take 1 tablet by mouth once a week. Tuesdays  . diazepam (VALIUM) 5 MG tablet Take 1 tablet (5 mg total) by mouth daily as needed for anxiety. for anxiety  . diltiazem (TIAZAC) 240 MG 24 hr capsule TAKE 1 TABLET BY MOUTH AT BEDTIME FOR BP (Patient taking differently: Take 240 mg by mouth at bedtime. )  . EPINEPHrine (EPIPEN 2-PAK) 0.3 mg/0.3 mL IJ SOAJ injection 0.3 mLs by Subdermal route once as needed. Allergic reaction  . famotidine (PEPCID) 20 MG tablet Take 20 mg by mouth as needed for heartburn or indigestion.  Marland Kitchen FARXIGA 10 MG TABS tablet TAKE 1 TABLET BY MOUTH EVERY DAY  . fexofenadine (ALLEGRA) 180 MG tablet Take 180 mg by mouth daily as needed for allergies or rhinitis.  Marland Kitchen glucose blood (ONETOUCH ULTRA) test strip Test twice daily  . JANUMET 50-1000 MG tablet TAKE 1 TABLET BY MOUTH TWICE A DAY WITH MEALS (Patient taking differently: Take 1 tablet by mouth 2 (two) times daily with a meal. )  . levothyroxine (SYNTHROID, LEVOTHROID) 112 MCG tablet Take 224 mcg by mouth every morning.   Marland Kitchen lisinopril-hydrochlorothiazide (ZESTORETIC) 20-25 MG tablet TAKE 1 TABLET BY MOUTH EVERY DAY (Patient taking  differently: Take 1 tablet by mouth daily. )  . Magnesium 500 MG CAPS Take 500 mg by mouth daily.   . promethazine (PHENERGAN) 25 MG tablet Take 25 mg by mouth every 8 (eight) hours as needed for nausea or vomiting.   . traZODone (DESYREL) 50 MG tablet Take 2 tabs po qhs (Patient taking differently: Take 100 mg by mouth at bedtime. )  . VIIBRYD 40 MG TABS TAKE 1 TABLET BY MOUTH EVERY DAY WITH FOOD (Patient taking differently: Take 40 mg by mouth daily. with food.)  . vitamin C (ASCORBIC ACID) 500 MG tablet Take 500 mg by mouth daily.  . [DISCONTINUED] butorphanol (STADOL) 10 MG/ML nasal spray  Place 2 sprays into the nose 3 (three) times daily as needed for migraine.     Allergies:   Otho Darner allergy]   Social History   Tobacco Use  . Smoking status: Never Smoker  . Smokeless tobacco: Never Used  Substance Use Topics  . Alcohol use: Yes    Comment: 07/07/2014 "a few times/year; weddings, anniversary, etc"  . Drug use: No     Family Hx: The patient's family history includes Alcoholism in her father; Cancer in her mother; Depression in her mother; Diabetes in her mother; Hypertension in her father and mother.  ROS:   Please see the history of present illness.    Review of Systems  Constitutional: Negative.  Negative for fever and malaise/fatigue.  Respiratory: Negative.  Negative for cough and shortness of breath.   Cardiovascular: Negative.   Musculoskeletal: Negative.   Neurological: Negative.  Negative for dizziness and tingling.  Psychiatric/Behavioral: Negative.     All other systems reviewed and are negative.   Labs/Other Tests and Data Reviewed:    Recent Labs: 05/18/2019: TSH 6.050 06/13/2019: B Natriuretic Peptide 21.1 06/15/2019: ALT 22; BUN 20; Creatinine, Ser 0.97; Hemoglobin 10.8; Magnesium 1.6; Platelets 603; Potassium 4.4; Sodium 136   Recent Lipid Panel Lab Results  Component Value Date/Time   CHOL 208 (H) 11/13/2018 02:25 PM   TRIG 158 (H)  06/04/2019 07:00 PM   HDL 49 11/13/2018 02:25 PM   CHOLHDL 4.2 11/13/2018 02:25 PM   LDLCALC 130 (H) 11/13/2018 02:25 PM    Wt Readings from Last 3 Encounters:  07/20/19 214 lb (97.1 kg)  06/16/19 216 lb 0.8 oz (98 kg)  05/18/19 227 lb 6.4 oz (103.1 kg)     Exam:    Vital Signs:  Ht 5' 5"  (1.651 m)   Wt 214 lb (97.1 kg)   BMI 35.61 kg/m     Physical Exam  Constitutional: She is oriented to person, place, and time and well-developed, well-nourished, and in no distress. No distress.  Pulmonary/Chest: Effort normal. No respiratory distress.  Neurological: She is alert and oriented to person, place, and time.  Psychiatric: Mood, memory, affect and judgment normal.    ASSESSMENT & PLAN:    1. History of COVID-19  She needs a release to return to work after covid infection  She is doing better since being admitted to the hospital  She is to have a follow up CBC and CMP  She can return to work on Jan 19  2. Essential hypertension  Chronic, controlled  Continue with current medication  3. AKI (acute kidney injury) ([place])  Will recheck kidney functions  Encouraged to stay well hydrated with water. - CMP14+EGFR; Future - CBC without diff; Future   COVID-19 Education: The signs and symptoms of COVID-19 were discussed with the patient and how to seek care for testing (follow up with PCP or arrange E-visit).  The importance of social distancing was discussed today.  Patient Risk:   After full review of this patients clinical status, I feel that they are at least moderate risk at this time.  Time:   Today, I have spent 10 minutes/ seconds with the patient with telehealth technology discussing above diagnoses.     Medication Adjustments/Labs and Tests Ordered: Current medicines are reviewed at length with the patient today.  Concerns regarding medicines are outlined above.   Tests Ordered: Orders Placed This Encounter  Procedures  . CMP14+EGFR  . CBC without  diff    Medication Changes: No  orders of the defined types were placed in this encounter.   Disposition:  Follow up prn  Signed, Minette Brine, FNP

## 2019-07-21 ENCOUNTER — Other Ambulatory Visit: Payer: Self-pay | Admitting: Internal Medicine

## 2019-07-21 NOTE — Telephone Encounter (Signed)
Stadol refill

## 2019-08-03 ENCOUNTER — Encounter: Payer: Self-pay | Admitting: Internal Medicine

## 2019-08-04 ENCOUNTER — Encounter: Payer: Self-pay | Admitting: Nurse Practitioner

## 2019-08-06 ENCOUNTER — Other Ambulatory Visit: Payer: Self-pay

## 2019-08-06 MED ORDER — APIXABAN 5 MG PO TABS: 5.0000 mg | ORAL_TABLET | Freq: Two times a day (BID) | ORAL | 1 refills | Status: DC

## 2019-08-06 NOTE — Progress Notes (Signed)
PATIENT: Brittany Rubio DOB: Sep 25, 1960  REASON FOR VISIT: follow up HISTORY FROM: patient  Chief Complaint  Patient presents with  . Follow-up    Yearly f/u. Alone. Rm 8. No new concerns at this time.      HISTORY OF PRESENT ILLNESS: Today 08/10/19 Brittany Rubio is a 59 y.o. female here today for follow up for OSA on CPAP. She reports doing well, overall. She has recently recovered from [place]. She was hospitalized for 2 weeks and was in rehab for about 11 days. Unfortunately, she developed DVT's while hospitalized and is now on Eliquis. She reports that she is not getting back to baseline. She continues to experience loss of smell. She has used CPAP consistently since being home but admits that while in the hospital and rehab, she did not use therapy. She does note benefit with therapy. She denies any concerns today.   Compliance report dated 06/27/2019 through 08/05/2019 reveals that she used CPAP 22 last 30 days for compliance of 73%.  21 days she used CPAP greater than 4 hours for compliance of 70%.  Average usage on days used was 5 hours and 56 minutes.  Residual AHI was 2.5 on 12 cm of water and an EPR of 3.  There was no significant leak noted.    HISTORY: (copied from Dr Dohmeier's note on 08/07/2018)  HPI:  Brittany Rubio is an afro-american  59 y.o. female patient, Seen in a RV on 08-07-2018. Since I had seen [name] last she had a prolonged leave of absence from her job since mid November, she underwent a hernia surgery.  She return to work on the fifth and needs, with her short-term disability came also a less compliant use at the same time of her CPAP machine.  She states that she noted a significant air leak almost a whistle and contacted [facility] by email but she has still not seen of supply of new equipment holes, headgear or mask.  She used the machine last on 29 June 2018.  In the meantime there have been 5 weeks without CPAP use and I will write a note  to [facility] today asking them to please send the supplies so that she can resume a more compliant use.  We discussed again that 4 hours of nightly use of the minimum use of time.  Her average daily usage on days used is 4 hours and 30 minutes, but she has an only 83% compliance by days and 60% compliance by time for October 2019.  The air leaks were indeed very high -between 35.9 L  and 29 L / minute.  She had very few residual apneas 2.7 and most of those are obstructive in origin.  There was no evidence of Cheyne-Stokes or central apnea.  The pressure is set at 12 cmH2O with 3 cm EPR.  Her device is auto titration capable.  FSS 31/ 63 points. Epworth sleepiness score 12 points- currently not using CPAP for 5-6 weeks.    She was seen here as in a referral from Dr. Baird Cancer for a sleep evaluation. [name] is a patient of Dr. Tye Savoy that underwent about 8 years ago a sleep study. The place where her sleep testing took place is no longer existing, reportedly it was close to [address], possible Dr. Konrad Felix laboratory on [address]. She felt that the CPAP was cumbersome but helpful. She felt less sleepy and her sleep may have been deeper but the machine  was not easy to use or set up. She is here today partially to see if she still would need CPAP, is unclear what stage of apnea she may be in, and also to see if the different treatment options today that were not available in 2010.  Past medical history is positive for diabetic nephropathy, chronic kidney disease stage II, type 2 diabetes attention deficit disorder since childhood, hypertensive renal disease, allergic rhinitis, joint arthritis, superobesity and obstructive sleep apnea I also reviewed briefly her list of medications. [name] is postmenopausal, she does still have insomnia and her obstructive sleep apnea has been untreated for years. The patient had been diagnosed with sleep apnea and was given a CPAP machine, but she was  unable to obtain supplies and has not used the machine in 4 or 5 years. She has been more daytime sleepy but usually can control her sleepiness either by starting to walk around or looking for other stimulation.   Sleep habits are as follows: She usually watches TV for the last hour before retreating to the bedroom, between 11 and midnight. She does watches TV in the bedroom and uses her smart phone. She struggles to fall asleep, but once asleep only sleeps for about 2 or 3 hour intervals fragmented by bathroom breaks. On average 2 bathroom breaks at night. She does not have preferred sleep position, sleeps prone or supine or on the side. She sleeps with 2-3 pillows for support. She sometimes experiences vivid dreams, and she has woken up out of dreams but rarely with palpitations or diaphoresis. She sets her alarm between 5:30 and 6 AM, she uses the snooze function several times before finally rising. She always feels that she needs more sleep and isn't restored and refreshed yet. Her nocturnal sleep time averages about 5 hours. She may take a Sunday afternoon nap but does not nap during week days. Her naps will be power naps of 30 minutes duration.  [name] had  been admitted to hospital with pneumonia and septic shock at [facility]. I reviewed the notes from [place] , [place].  [name] was hospitalized on 11-26-2016.  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 Diagnosed by Dr. Benjamine Mola.    Sleep medical history and family sleep history:  Mother snored, she's not aware of anybody being diagnosed with sleep apnea, her maternal grandmother suffered from heart disease in the paternal grandmother for breast cancer her mother died of pancreatic cancer she was in her early 11s. Her father died of meningitis - very young in 1979, smoked and drank .  Social history: [name] is married, has  one adult adopted son, she is a nonsmoker never used tobacco products she very seldomly drinks alcohol, she does use coffee in the mornings about 2 cups and sometimes tea or soda in the afternoons but not daily. She has no shift work history. She works at [employer] in [place], she is an Microbiologist.   REVIEW OF SYSTEMS: Out of a complete 14 system review of symptoms, the patient complains only of the following symptoms, headaches and all other reviewed systems are negative.  Epworth sleepiness scale: 5 Fatigue severity scale: 34  ALLERGIES: Allergies  Allergen Reactions  . Crab [Shellfish Allergy] Hives and Swelling    "Only when I eat too many crab legs" Can tolerate CT scans with contrast-does not need premeds  HOME MEDICATIONS: Outpatient Medications Prior to Visit  Medication Sig Dispense Refill  . albuterol (VENTOLIN HFA) 108 (90 Base) MCG/ACT inhaler Inhale 2 puffs into the lungs every 6 (six) hours as needed for wheezing or shortness of breath. 8 g 0  . amphetamine-dextroamphetamine (ADDERALL XR) 25 MG 24 hr capsule Take 2 capsules by mouth daily. Take with 30 mg to equal 50 mg daily 6 capsule 0  . apixaban (ELIQUIS) 5 MG TABS tablet Take 1 tablet (5 mg total) by mouth 2 (two) times daily. 180 tablet 1  . butorphanol (STADOL) 10 MG/ML nasal spray PLACE 1 SPRAY INTO THE NOSE 3 (THREE) TIMES DAILY AS NEEDED FOR MIGRAINE. 2.5 mL 1  . cholecalciferol (VITAMIN D3) 25 MCG (1000 UT) tablet Take 1,000 Units by mouth daily.    . Cyanocobalamin (VITAMIN B-12 PO) Take 1 tablet by mouth once a week. Tuesdays    . diazepam (VALIUM) 5 MG tablet Take 1 tablet (5 mg total) by mouth daily as needed for anxiety. for anxiety 3 tablet 0  . diltiazem (TIAZAC) 240 MG 24 hr capsule TAKE 1 TABLET BY MOUTH AT BEDTIME FOR BP (Patient taking differently: Take 240 mg by mouth at bedtime. ) 90 capsule 1  . EPINEPHrine (EPIPEN 2-PAK) 0.3 mg/0.3 mL IJ SOAJ injection 0.3 mLs by  Subdermal route once as needed. Allergic reaction    . famotidine (PEPCID) 20 MG tablet Take 20 mg by mouth as needed for heartburn or indigestion.    Marland Kitchen FARXIGA 10 MG TABS tablet TAKE 1 TABLET BY MOUTH EVERY DAY 90 tablet 0  . fexofenadine (ALLEGRA) 180 MG tablet Take 180 mg by mouth daily as needed for allergies or rhinitis.    Marland Kitchen glucose blood (ONETOUCH ULTRA) test strip Test twice daily 100 each 0  . JANUMET 50-1000 MG tablet TAKE 1 TABLET BY MOUTH TWICE A DAY WITH MEALS (Patient taking differently: Take 1 tablet by mouth 2 (two) times daily with a meal. ) 180 tablet 1  . Lancets (ONETOUCH ULTRASOFT) lancets Test twice daily 100 each 0  . levothyroxine (SYNTHROID, LEVOTHROID) 112 MCG tablet Take 224 mcg by mouth every morning.     Marland Kitchen lisinopril-hydrochlorothiazide (ZESTORETIC) 20-25 MG tablet TAKE 1 TABLET BY MOUTH EVERY DAY (Patient taking differently: Take 1 tablet by mouth daily. ) 90 tablet 1  . Magnesium 500 MG CAPS Take 500 mg by mouth daily.     Marland Kitchen omega-3 acid ethyl esters (LOVAZA) 1 g capsule Take 1 g by mouth daily.     . promethazine (PHENERGAN) 25 MG tablet Take 25 mg by mouth every 8 (eight) hours as needed for nausea or vomiting.   0  . traZODone (DESYREL) 50 MG tablet Take 2 tabs po qhs (Patient taking differently: Take 100 mg by mouth at bedtime. ) 180 tablet 2  . VIIBRYD 40 MG TABS TAKE 1 TABLET BY MOUTH EVERY DAY WITH FOOD (Patient taking differently: Take 40 mg by mouth daily. with food.) 90 tablet 1  . vitamin C (ASCORBIC ACID) 500 MG tablet Take 500 mg by mouth daily.    . benzonatate (TESSALON PERLES) 100 MG capsule Take 1 capsule (100 mg total) by mouth 3 (three) times daily as needed. (Patient not taking: Reported on 07/20/2019) 20 capsule 0  . aspirin EC 81 MG tablet Take 81 mg by mouth daily.    . Turmeric Curcumin 500 MG CAPS Take 500 mg by mouth daily.      No facility-administered medications prior to visit.  PAST MEDICAL HISTORY: Past Medical History:  Diagnosis  Date  . ADD (attention deficit disorder)   . Anemia   . Anxiety   . Arthritis    "left shoulder" (07/07/2014)  . Back pain   . Breast cancer ([place])    "left"  . Depression   . Fatty liver   . Food allergy    shellfish  . GERD (gastroesophageal reflux disease)    "takes over the counters as needed"  . Hernia, umbilical   . High cholesterol   . Hypertension 11/26/2011   sees Dr. Bryon Lions  . Hypothyroidism   . Incisional hernia without obstruction or gangrene 06/05/2018  . Joint pain   . Migraines    "maybe once q 3 months" (07/07/2014)  . Multinodular thyroid 2015  . OSA on CPAP   . Personal history of radiation therapy   . Pneumonia    May 2018  . Sepsis due to Streptococcus pneumoniae ([place])   . Septic shock ([place])   . Severe sepsis ([place]) 06/05/2019  . Thyroid cancer ([place])    2015  . Type II diabetes mellitus ([place])   . Vitamin D deficiency     PAST SURGICAL HISTORY: Past Surgical History:  Procedure Laterality Date  . ABDOMINAL HYSTERECTOMY  ? 1997  . BREAST EXCISIONAL BIOPSY Right 2014  . BREAST LUMPECTOMY Left 2009  . BREAST LUMPECTOMY Right 07/2012   "not a mastectomy"  . BREAST REDUCTION SURGERY Bilateral   . DIAPHRAGM SURGERY  1986   "trauma"  . INCISIONAL HERNIA REPAIR N/A 06/05/2018   Procedure: LAPAROSCOPIC INCISIONAL HERNIA ERAS PATHWAY;  Surgeon: Erroll Luna, MD;  Location: [facility];  Service: General;  Laterality: N/A;  . INSERTION OF MESH N/A 06/05/2018   Procedure: INSERTION OF MESH;  Surgeon: Erroll Luna, MD;  Location: [facility];  Service: General;  Laterality: N/A;  . PARTIAL MASTECTOMY WITH NEEDLE LOCALIZATION  08/12/2012   Procedure: PARTIAL MASTECTOMY WITH NEEDLE LOCALIZATION;  Surgeon: Joyice Faster. Cornett, MD;  Location: [facility];  Service: General;  Laterality: Right;  . REDUCTION MAMMAPLASTY Bilateral 1995  . THYROIDECTOMY Left 07/07/2014   Procedure: LEFT HEMI-THYROIDECTOMY;  Surgeon: Ascencion Dike, MD;  Location: [facility];  Service: ENT;   Laterality: Left;  . THYROIDECTOMY Right 07/08/2014   Procedure: Completion THYROIDECTOMY;  Surgeon: Ascencion Dike, MD;  Location: [facility];  Service: ENT;  Laterality: Right;  . THYROIDECTOMY, PARTIAL Left 07/07/2014   hemi    FAMILY HISTORY: Family History  Problem Relation Age of Onset  . Cancer Mother        Pancreatic  . Diabetes Mother   . Hypertension Mother   . Depression Mother   . Hypertension Father   . Alcoholism Father     SOCIAL HISTORY: Social History   Socioeconomic History  . Marital status: Married    Spouse name: Meryl Crutch  . Number of children: Not on file  . Years of education: Not on file  . Highest education level: Not on file  Occupational History  . Not on file  Tobacco Use  . Smoking status: Never Smoker  . Smokeless tobacco: Never Used  Substance and Sexual Activity  . Alcohol use: Yes    Comment: 07/07/2014 "a few times/year; weddings, anniversary, etc"  . Drug use: No  . Sexual activity: Yes  Other Topics Concern  . Not on file  Social History Narrative  . Not on file   Social Determinants of Health   Financial Resource Strain:   .  Difficulty of Paying Living Expenses: Not on file  Food Insecurity:   . Worried About Charity fundraiser in the Last Year: Not on file  . Ran Out of Food in the Last Year: Not on file  Transportation Needs:   . Lack of Transportation (Medical): Not on file  . Lack of Transportation (Non-Medical): Not on file  Physical Activity:   . Days of Exercise per Week: Not on file  . Minutes of Exercise per Session: Not on file  Stress:   . Feeling of Stress : Not on file  Social Connections:   . Frequency of Communication with Friends and Family: Not on file  . Frequency of Social Gatherings with Friends and Family: Not on file  . Attends Religious Services: Not on file  . Active Member of Clubs or Organizations: Not on file  . Attends Archivist Meetings: Not on file  . Marital Status: Not on file    Intimate Partner Violence:   . Fear of Current or Ex-Partner: Not on file  . Emotionally Abused: Not on file  . Physically Abused: Not on file  . Sexually Abused: Not on file      PHYSICAL EXAM  Vitals:   08/10/19 0725  BP: (!) 147/85  Pulse: 84  Temp: (!) 97.2 F (36.2 C)  TempSrc: Oral  Weight: 215 lb 6.4 oz (97.7 kg)  Height: 5\' 5"  (1.651 m)   Body mass index is 35.84 kg/m.  Generalized: Well developed, in no acute distress  Cardiology: normal rate and rhythm, no murmur noted Respiratory: Clear to auscultation bilaterally  Neurological examination  Mentation: Alert oriented to time, place, history taking. Follows all commands speech and language fluent Cranial nerve II-XII: Pupils were equal round reactive to light. Extraocular movements were full, visual field were full. Patient reports loss of smell due to Covid.  Motor: The motor testing reveals 5 over 5 strength of all 4 extremities. Good symmetric motor tone is noted throughout.  Sensory: Sensory testing is intact to soft touch on all 4 extremities. No evidence of extinction is noted.  Gait and station: Gait is normal.    DIAGNOSTIC DATA (LABS, IMAGING, TESTING) - I reviewed patient records, labs, notes, testing and imaging myself where available.  No flowsheet data found.   Lab Results  Component Value Date   WBC 10.4 06/15/2019   HGB 10.8 (L) 06/15/2019   HCT 35.0 (L) 06/15/2019   MCV 72.3 (L) 06/15/2019   PLT 603 (H) 06/15/2019      Component Value Date/Time   NA 136 06/15/2019 0234   NA 139 05/18/2019 1706   NA 143 05/28/2013 0844   K 4.4 06/15/2019 0234   K 4.4 05/28/2013 0844   CL 99 06/15/2019 0234   CL 99 11/20/2012 0940   CO2 27 06/15/2019 0234   CO2 28 05/28/2013 0844   GLUCOSE 94 06/15/2019 0234   GLUCOSE 94 05/28/2013 0844   GLUCOSE 142 (H) 11/20/2012 0940   BUN 20 06/15/2019 0234   BUN 17 05/18/2019 1706   BUN 16.9 05/28/2013 0844   CREATININE 0.97 06/15/2019 0234   CREATININE  0.8 05/28/2013 0844   CALCIUM 9.2 06/15/2019 0234   CALCIUM 9.8 05/28/2013 0844   PROT 7.2 06/15/2019 0234   PROT 7.4 05/18/2019 1706   PROT 7.9 05/28/2013 0844   ALBUMIN 2.8 (L) 06/15/2019 0234   ALBUMIN 4.2 05/18/2019 1706   ALBUMIN 3.7 05/28/2013 0844   AST 21 06/15/2019 0234   AST  15 05/28/2013 0844   ALT 22 06/15/2019 0234   ALT 23 05/28/2013 0844   ALKPHOS 48 06/15/2019 0234   ALKPHOS 58 05/28/2013 0844   BILITOT 0.5 06/15/2019 0234   BILITOT <0.2 05/18/2019 1706   BILITOT 0.28 05/28/2013 0844   GFRNONAA >60 06/15/2019 0234   GFRAA >60 06/15/2019 0234   Lab Results  Component Value Date   CHOL 208 (H) 11/13/2018   HDL 49 11/13/2018   LDLCALC 130 (H) 11/13/2018   TRIG 158 (H) 06/04/2019   CHOLHDL 4.2 11/13/2018   Lab Results  Component Value Date   HGBA1C 8.7 (H) 05/18/2019   Lab Results  Component Value Date   VITAMINB12 >2000 (H) 04/17/2018   Lab Results  Component Value Date   TSH 6.050 (H) 05/18/2019       ASSESSMENT AND PLAN 59 y.o. year old female  has a past medical history of ADD (attention deficit disorder), Anemia, Anxiety, Arthritis, Back pain, Breast cancer ([place]), Depression, Fatty liver, Food allergy, GERD (gastroesophageal reflux disease), Hernia, umbilical, High cholesterol, Hypertension (11/26/2011), Hypothyroidism, Incisional hernia without obstruction or gangrene (06/05/2018), Joint pain, Migraines, Multinodular thyroid (2015), OSA on CPAP, Personal history of radiation therapy, Pneumonia, Sepsis due to Streptococcus pneumoniae ([place]), Septic shock ([place]), Severe sepsis ([place]) (06/05/2019), Thyroid cancer ([place]), Type II diabetes mellitus ([place]), and Vitamin D deficiency. here with     ICD-10-CM   1. OSA on CPAP  G47.33    Z99.89   2. History of COVID-19  Z86.16     Overall, Mrs. Husa is doing very well with CPAP therapy.  Unfortunately, she was recently hospitalized and required rehab for 11 days following a Covid infection complicated by DVTs.   She has been seen by primary care and doing very well.  She denies any concerns today.  Compliance report reveals that she has used CPAP 73% of the time over the last 30 days.  She reports that prior to Covid she was much more consistent use.  She was encouraged to continue using CPAP nightly and greater than 4 hours each night.  She reports having all the supplies that she needs.  She will call us with any concerns.  She will follow-up with me in 1 year, sooner if needed.  She verbalizes understanding and agreement with this plan.   No orders of the defined types were placed in this encounter.    No orders of the defined types were placed in this encounter.     I spent 15 minutes with the patient. 50% of this time was spent counseling and educating patient on plan of care and medications.    Debbora Presto, FNP-C 08/10/2019, 7:59 AM [facility] [address] [place], [place] [address] [phone]

## 2019-08-06 NOTE — Patient Instructions (Addendum)
Continue CPAP nightly and greater than 4 hours each night   Follow up in 1 year, sooner if needed   Sleep Apnea Sleep apnea affects breathing during sleep. It causes breathing to stop for a short time or to become shallow. It can also increase the risk of:  Heart attack.  Stroke.  Being very overweight (obese).  Diabetes.  Heart failure.  Irregular heartbeat. The goal of treatment is to help you breathe normally again. What are the causes? There are three kinds of sleep apnea:  Obstructive sleep apnea. This is caused by a blocked or collapsed airway.  Central sleep apnea. This happens when the brain does not send the right signals to the muscles that control breathing.  Mixed sleep apnea. This is a combination of obstructive and central sleep apnea. The most common cause of this condition is a collapsed or blocked airway. This can happen if:  Your throat muscles are too relaxed.  Your tongue and tonsils are too large.  You are overweight.  Your airway is too small. What increases the risk?  Being overweight.  Smoking.  Having a small airway.  Being older.  Being female.  Drinking alcohol.  Taking medicines to calm yourself (sedatives or tranquilizers).  Having family members with the condition. What are the signs or symptoms?  Trouble staying asleep.  Being sleepy or tired during the day.  Getting angry a lot.  Loud snoring.  Headaches in the morning.  Not being able to focus your mind (concentrate).  Forgetting things.  Less interest in sex.  Mood swings.  Personality changes.  Feelings of sadness (depression).  Waking up a lot during the night to pee (urinate).  Dry mouth.  Sore throat. How is this diagnosed?  Your medical history.  A physical exam.  A test that is done when you are sleeping (sleep study). The test is most often done in a sleep lab but may also be done at home. How is this treated?   Sleeping on your  side.  Using a medicine to get rid of mucus in your nose (decongestant).  Avoiding the use of alcohol, medicines to help you relax, or certain pain medicines (narcotics).  Losing weight, if needed.  Changing your diet.  Not smoking.  Using a machine to open your airway while you sleep, such as: ? An oral appliance. This is a mouthpiece that shifts your lower jaw forward. ? A CPAP device. This device blows air through a mask when you breathe out (exhale). ? An EPAP device. This has valves that you put in each nostril. ? A BPAP device. This device blows air through a mask when you breathe in (inhale) and breathe out.  Having surgery if other treatments do not work. It is important to get treatment for sleep apnea. Without treatment, it can lead to:  High blood pressure.  Coronary artery disease.  In men, not being able to have an erection (impotence).  Reduced thinking ability. Follow these instructions at home: Lifestyle  Make changes that your doctor recommends.  Eat a healthy diet.  Lose weight if needed.  Avoid alcohol, medicines to help you relax, and some pain medicines.  Do not use any products that contain nicotine or tobacco, such as cigarettes, e-cigarettes, and chewing tobacco. If you need help quitting, ask your doctor. General instructions  Take over-the-counter and prescription medicines only as told by your doctor.  If you were given a machine to use while you sleep, use it only   as told by your doctor.  If you are having surgery, make sure to tell your doctor you have sleep apnea. You may need to bring your device with you.  Keep all follow-up visits as told by your doctor. This is important. Contact a doctor if:  The machine that you were given to use during sleep bothers you or does not seem to be working.  You do not get better.  You get worse. Get help right away if:  Your chest hurts.  You have trouble breathing in enough air.  You have  an uncomfortable feeling in your back, arms, or stomach.  You have trouble talking.  One side of your body feels weak.  A part of your face is hanging down. These symptoms may be an emergency. Do not wait to see if the symptoms will go away. Get medical help right away. Call your local emergency services (911 in the [place] Do not drive yourself to the hospital. Summary  This condition affects breathing during sleep.  The most common cause is a collapsed or blocked airway.  The goal of treatment is to help you breathe normally while you sleep. This information is not intended to replace advice given to you by your health care provider. Make sure you discuss any questions you have with your health care provider. Document Revised: 04/18/2018 Document Reviewed: 02/25/2018 Elsevier Patient Education  2020 Elsevier Inc.  

## 2019-08-10 ENCOUNTER — Ambulatory Visit: Payer: BC Managed Care – PPO | Admitting: Family Medicine

## 2019-08-10 ENCOUNTER — Ambulatory Visit: Payer: BC Managed Care – PPO | Admitting: Adult Health

## 2019-08-10 ENCOUNTER — Other Ambulatory Visit: Payer: Self-pay

## 2019-08-10 ENCOUNTER — Encounter: Payer: Self-pay | Admitting: Family Medicine

## 2019-08-10 VITALS — BP 147/85 | HR 84 | Temp 97.2°F | Ht 65.0 in | Wt 215.4 lb

## 2019-08-10 DIAGNOSIS — Z8616 Personal history of COVID-19: Secondary | ICD-10-CM | POA: Diagnosis not present

## 2019-08-10 DIAGNOSIS — G4733 Obstructive sleep apnea (adult) (pediatric): Secondary | ICD-10-CM | POA: Diagnosis not present

## 2019-08-10 DIAGNOSIS — Z9989 Dependence on other enabling machines and devices: Secondary | ICD-10-CM

## 2019-08-12 ENCOUNTER — Encounter: Payer: Self-pay | Admitting: Nurse Practitioner

## 2019-08-12 NOTE — Telephone Encounter (Signed)
Please write a letter to extend her to August 21, 2019.

## 2019-08-15 ENCOUNTER — Other Ambulatory Visit: Payer: Self-pay | Admitting: Internal Medicine

## 2019-08-15 DIAGNOSIS — C50919 Malignant neoplasm of unspecified site of unspecified female breast: Secondary | ICD-10-CM

## 2019-08-16 ENCOUNTER — Other Ambulatory Visit: Payer: Self-pay | Admitting: Internal Medicine

## 2019-08-18 ENCOUNTER — Telehealth: Payer: Self-pay

## 2019-08-18 NOTE — Telephone Encounter (Signed)
Pa sent to plan for viibryd

## 2019-08-19 ENCOUNTER — Telehealth: Payer: Self-pay

## 2019-08-19 ENCOUNTER — Other Ambulatory Visit: Payer: Self-pay | Admitting: Internal Medicine

## 2019-08-19 MED ORDER — VORTIOXETINE HBR 10 MG PO TABS: 10.0000 mg | ORAL_TABLET | Freq: Every day | ORAL | 0 refills | Status: DC

## 2019-08-19 NOTE — Telephone Encounter (Signed)
PA approved for viibryd cost to pt is $837 for 90 day supply pt was switched to trintellix 10mg . Pt advised to take half of viibryd tablets until all gone and pick up samples of trintellix

## 2019-08-19 NOTE — Telephone Encounter (Signed)
Samples of trintellix ready for pick up

## 2019-08-19 NOTE — Telephone Encounter (Signed)
Trazodone refill 

## 2019-08-20 ENCOUNTER — Encounter: Payer: Self-pay | Admitting: Internal Medicine

## 2019-08-20 ENCOUNTER — Other Ambulatory Visit: Payer: Self-pay

## 2019-08-20 ENCOUNTER — Ambulatory Visit: Payer: BC Managed Care – PPO | Admitting: Internal Medicine

## 2019-08-20 VITALS — BP 112/78 | HR 75 | Temp 98.3°F | Ht 65.0 in | Wt 215.0 lb

## 2019-08-20 DIAGNOSIS — Z6835 Body mass index (BMI) 35.0-35.9, adult: Secondary | ICD-10-CM

## 2019-08-20 DIAGNOSIS — E7800 Pure hypercholesterolemia, unspecified: Secondary | ICD-10-CM

## 2019-08-20 DIAGNOSIS — E1122 Type 2 diabetes mellitus with diabetic chronic kidney disease: Secondary | ICD-10-CM

## 2019-08-20 DIAGNOSIS — Z79899 Other long term (current) drug therapy: Secondary | ICD-10-CM

## 2019-08-20 DIAGNOSIS — N182 Chronic kidney disease, stage 2 (mild): Secondary | ICD-10-CM | POA: Diagnosis not present

## 2019-08-20 DIAGNOSIS — I129 Hypertensive chronic kidney disease with stage 1 through stage 4 chronic kidney disease, or unspecified chronic kidney disease: Secondary | ICD-10-CM

## 2019-08-20 DIAGNOSIS — E1165 Type 2 diabetes mellitus with hyperglycemia: Secondary | ICD-10-CM

## 2019-08-20 DIAGNOSIS — F320 Major depressive disorder, single episode, mild: Secondary | ICD-10-CM | POA: Insufficient documentation

## 2019-08-20 DIAGNOSIS — IMO0002 Reserved for concepts with insufficient information to code with codable children: Secondary | ICD-10-CM

## 2019-08-20 DIAGNOSIS — Z8616 Personal history of COVID-19: Secondary | ICD-10-CM

## 2019-08-20 NOTE — Patient Instructions (Signed)

## 2019-08-20 NOTE — Progress Notes (Signed)
This visit occurred during the SARS-CoV-2 public health emergency.  Safety protocols were in place, including screening questions prior to the visit, additional usage of staff PPE, and extensive cleaning of exam room while observing appropriate contact time as indicated for disinfecting solutions.  Subjective:     Patient ID: Brittany Rubio , female    DOB: 12-04-1960 , 59 y.o.   MRN: [id number]   Chief Complaint  Patient presents with  . Diabetes  . Hypertension    HPI  She presents today for DM/HTN check. She reports compliance with meds.   Of note, she was hospitalized for COVID in Nov 2020. She required SNF for rehab and then moved in with her sister. She is feeling better, now home with her husband who also had COVID. Her hospitalization was complicated by bilateral DVTs.   Diabetes She presents for her follow-up diabetic visit. She has type 2 diabetes mellitus. Her disease course has been stable. Headaches: she has h/o migraines. needs refill of Stadol NS. Pertinent negatives for diabetes include no blurred vision and no chest pain. There are no hypoglycemic complications. Diabetic complications include nephropathy. Risk factors for coronary artery disease include diabetes mellitus, dyslipidemia, hypertension, obesity, sedentary lifestyle, post-menopausal and stress. Her weight is fluctuating minimally. She is following a diabetic diet. She participates in exercise intermittently. An ACE inhibitor/angiotensin II receptor blocker is being taken.  Hypertension This is a chronic problem. The current episode started more than 1 year ago. The problem has been gradually improving since onset. The problem is uncontrolled. Pertinent negatives include no blurred vision, chest pain, palpitations or shortness of breath. Headaches: she has h/o migraines. needs refill of Stadol NS. Hypertensive end-organ damage includes kidney disease.     Past Medical History:  Diagnosis Date  . ADD  (attention deficit disorder)   . Anemia   . Anxiety   . Arthritis    "left shoulder" (07/07/2014)  . Back pain   . Breast cancer ([place])    "left"  . Depression   . Fatty liver   . Food allergy    shellfish  . GERD (gastroesophageal reflux disease)    "takes over the counters as needed"  . Hernia, umbilical   . High cholesterol   . Hypertension 11/26/2011   sees Dr. Bryon Lions  . Hypothyroidism   . Incisional hernia without obstruction or gangrene 06/05/2018  . Joint pain   . Migraines    "maybe once q 3 months" (07/07/2014)  . Multinodular thyroid 2015  . OSA on CPAP   . Personal history of radiation therapy   . Pneumonia    May 2018  . Sepsis due to Streptococcus pneumoniae ([place])   . Septic shock ([place])   . Severe sepsis ([place]) 06/05/2019  . Thyroid cancer ([place])    2015  . Type II diabetes mellitus ([place])   . Vitamin D deficiency      Family History  Problem Relation Age of Onset  . Cancer Mother        Pancreatic  . Diabetes Mother   . Hypertension Mother   . Depression Mother   . Hypertension Father   . Alcoholism Father      Current Outpatient Medications:  .  albuterol (VENTOLIN HFA) 108 (90 Base) MCG/ACT inhaler, Inhale 2 puffs into the lungs every 6 (six) hours as needed for wheezing or shortness of breath., Disp: 8 g, Rfl: 0 .  amphetamine-dextroamphetamine (ADDERALL XR) 20 MG 24 hr capsule, Take 20 mg by  mouth daily. 1 per day with the 10 mg and 30 mg xr, Disp: , Rfl:  .  amphetamine-dextroamphetamine (ADDERALL XR) 30 MG 24 hr capsule, Take 30 mg by mouth daily. Take 1 capsule with the 10 mg and the 20 mg xr, Disp: , Rfl:  .  amphetamine-dextroamphetamine (ADDERALL) 10 MG tablet, Take 10 mg by mouth daily with breakfast. Take 1 per day with the 20 mg xr and the 30 mg xr, Disp: , Rfl:  .  apixaban (ELIQUIS) 5 MG TABS tablet, Take 1 tablet (5 mg total) by mouth 2 (two) times daily., Disp: 180 tablet, Rfl: 1 .  butorphanol (STADOL) 10 MG/ML nasal spray,  PLACE 1 SPRAY INTO THE NOSE 3 (THREE) TIMES DAILY AS NEEDED FOR MIGRAINE., Disp: 2.5 mL, Rfl: 1 .  cholecalciferol (VITAMIN D3) 25 MCG (1000 UT) tablet, Take 1,000 Units by mouth daily., Disp: , Rfl:  .  Cyanocobalamin (VITAMIN B-12 PO), Take 1 tablet by mouth once a week. Tuesdays, Disp: , Rfl:  .  diazepam (VALIUM) 5 MG tablet, Take 1 tablet (5 mg total) by mouth daily as needed for anxiety. for anxiety, Disp: 3 tablet, Rfl: 0 .  EPINEPHrine (EPIPEN 2-PAK) 0.3 mg/0.3 mL IJ SOAJ injection, 0.3 mLs by Subdermal route once as needed. Allergic reaction, Disp: , Rfl:  .  famotidine (PEPCID) 20 MG tablet, Take 20 mg by mouth as needed for heartburn or indigestion., Disp: , Rfl:  .  FARXIGA 10 MG TABS tablet, TAKE 1 TABLET BY MOUTH EVERY DAY, Disp: 90 tablet, Rfl: 0 .  fexofenadine (ALLEGRA) 180 MG tablet, Take 180 mg by mouth daily as needed for allergies or rhinitis., Disp: , Rfl:  .  glucose blood (ONETOUCH ULTRA) test strip, Test twice daily, Disp: 100 each, Rfl: 0 .  JANUMET 50-1000 MG tablet, TAKE 1 TABLET BY MOUTH TWICE A DAY WITH MEALS (Patient taking differently: Take 1 tablet by mouth 2 (two) times daily with a meal. ), Disp: 180 tablet, Rfl: 1 .  Lancets (ONETOUCH ULTRASOFT) lancets, Test twice daily, Disp: 100 each, Rfl: 0 .  levothyroxine (SYNTHROID, LEVOTHROID) 112 MCG tablet, Take 224 mcg by mouth every morning. , Disp: , Rfl:  .  lisinopril-hydrochlorothiazide (ZESTORETIC) 20-25 MG tablet, TAKE 1 TABLET BY MOUTH EVERY DAY, Disp: 90 tablet, Rfl: 1 .  Magnesium 500 MG CAPS, Take 500 mg by mouth daily. , Disp: , Rfl:  .  omega-3 acid ethyl esters (LOVAZA) 1 g capsule, Take 1 g by mouth daily. , Disp: , Rfl:  .  promethazine (PHENERGAN) 25 MG tablet, Take 25 mg by mouth every 8 (eight) hours as needed for nausea or vomiting. , Disp: , Rfl: 0 .  TIADYLT ER 240 MG 24 hr capsule, TAKE 1 TABLET BY MOUTH AT BEDTIME FOR BP, Disp: 90 capsule, Rfl: 1 .  traZODone (DESYREL) 50 MG tablet, TAKE 2  TABLETS BY MOUTH AT BEDTIME, Disp: 180 tablet, Rfl: 2 .  vitamin C (ASCORBIC ACID) 500 MG tablet, Take 500 mg by mouth daily., Disp: , Rfl:  .  benzonatate (TESSALON PERLES) 100 MG capsule, Take 1 capsule (100 mg total) by mouth 3 (three) times daily as needed. (Patient not taking: Reported on 07/20/2019), Disp: 20 capsule, Rfl: 0 .  vortioxetine HBr (TRINTELLIX) 10 MG TABS tablet, Take 1 tablet (10 mg total) by mouth daily. (Patient not taking: Reported on 08/20/2019), Disp: 90 tablet, Rfl: 0   Allergies  Allergen Reactions  . Crab [Shellfish Allergy] Hives and Swelling    "  Only when I eat too many crab legs" Can tolerate CT scans with contrast-does not need premeds        Review of Systems  Constitutional: Negative.   Eyes: Negative for blurred vision.  Respiratory: Negative.  Negative for shortness of breath.   Cardiovascular: Negative.  Negative for chest pain and palpitations.  Gastrointestinal: Negative.   Neurological: Negative.  Headaches: she has h/o migraines. needs refill of Stadol NS.  Psychiatric/Behavioral: Negative.      Today's Vitals   08/20/19 0913  BP: 112/78  Pulse: 75  Temp: 98.3 F (36.8 C)  TempSrc: Oral  Weight: 215 lb (97.5 kg)  Height: 5' 5"  (1.651 m)   Body mass index is 35.78 kg/m.   Objective:  Physical Exam Vitals and nursing note reviewed.  Constitutional:      Appearance: Normal appearance. She is obese.  HENT:     Head: Normocephalic and atraumatic.  Cardiovascular:     Rate and Rhythm: Normal rate and regular rhythm.     Heart sounds: Normal heart sounds.  Pulmonary:     Effort: Pulmonary effort is normal.     Breath sounds: Normal breath sounds.  Skin:    General: Skin is warm.  Neurological:     General: No focal deficit present.     Mental Status: She is alert.  Psychiatric:        Mood and Affect: Mood normal.        Behavior: Behavior normal.         Assessment And Plan:     1. Uncontrolled diabetes mellitus with stage  2 chronic kidney disease (HCC)  Chronic. I will check labs as listed below. I will adjust meds as needed once labs are reviewed. Importance of dietary compliance was discussed with the patient.   - CMP14+EGFR - Hemoglobin A1c - Lipid panel  2. Hypertensive nephropathy  Chronic, well controlled. She will continue with current meds. She is encouraged to avoid adding salt to her foods.   3. Pure hypercholesterolemia  Chronic. She will continue with statin therapy. I will check fasting lipid panel and LFTS today.   4. Class 2 severe obesity due to excess calories with serious comorbidity and body mass index (BMI) of 35.0 to 35.9 in adult [facility])  She is encouraged to strive for BMI less than 30 to decrease cardiac risk. Will suggest increased exercise once she has fully recovered from COVID infection.   5. Drug therapy   6. Personal history of covid-19  She has already received her first COVID vaccination. She did not have any side effects other than fatigue.     [name], MD    THE PATIENT IS ENCOURAGED TO PRACTICE SOCIAL DISTANCING DUE TO THE COVID-19 PANDEMIC.

## 2019-08-21 ENCOUNTER — Encounter: Payer: Self-pay | Admitting: Internal Medicine

## 2019-08-21 LAB — CMP14+EGFR
ALT: 17 IU/L (ref 0–32)
AST: 17 IU/L (ref 0–40)
Albumin/Globulin Ratio: 1.1 — ABNORMAL LOW (ref 1.2–2.2)
Albumin: 4.1 g/dL (ref 3.8–4.9)
Alkaline Phosphatase: 56 IU/L (ref 39–117)
BUN/Creatinine Ratio: 18 (ref 9–23)
BUN: 18 mg/dL (ref 6–24)
Bilirubin Total: 0.2 mg/dL (ref 0.0–1.2)
CO2: 25 mmol/L (ref 20–29)
Calcium: 9.8 mg/dL (ref 8.7–10.2)
Chloride: 100 mmol/L (ref 96–106)
Creatinine, Ser: 0.99 mg/dL (ref 0.57–1.00)
GFR calc Af Amer: 72 mL/min/{1.73_m2} (ref >59)
GFR calc non Af Amer: 63 mL/min/{1.73_m2} (ref >59)
Globulin, Total: 3.7 g/dL (ref 1.5–4.5)
Glucose: 109 mg/dL — ABNORMAL HIGH (ref 65–99)
Potassium: 5 mmol/L (ref 3.5–5.2)
Sodium: 141 mmol/L (ref 134–144)
Total Protein: 7.8 g/dL (ref 6.0–8.5)

## 2019-08-21 LAB — HEMOGLOBIN A1C
Est. average glucose Bld gHb Est-mCnc: 174 mg/dL
Hgb A1c MFr Bld: 7.7 % — ABNORMAL HIGH (ref 4.8–5.6)

## 2019-08-21 LAB — LIPID PANEL
Chol/HDL Ratio: 4.2 ratio (ref 0.0–4.4)
Cholesterol, Total: 208 mg/dL — ABNORMAL HIGH (ref 100–199)
HDL: 49 mg/dL (ref >39)
LDL Chol Calc (NIH): 132 mg/dL — ABNORMAL HIGH (ref 0–99)
Triglycerides: 152 mg/dL — ABNORMAL HIGH (ref 0–149)
VLDL Cholesterol Cal: 27 mg/dL (ref 5–40)

## 2019-09-04 ENCOUNTER — Other Ambulatory Visit: Payer: Self-pay | Admitting: Internal Medicine

## 2019-09-04 MED ORDER — PRAVASTATIN SODIUM 80 MG PO TABS: ORAL_TABLET | ORAL | 1 refills | Status: DC

## 2019-09-08 ENCOUNTER — Other Ambulatory Visit: Payer: Self-pay | Admitting: Internal Medicine

## 2019-09-08 NOTE — Telephone Encounter (Signed)
Patient is requesting a refill on Diazepam 5mg  and also Promethazine 25mg 

## 2019-09-15 ENCOUNTER — Telehealth: Payer: Self-pay

## 2019-09-15 NOTE — Telephone Encounter (Signed)
Left the pt a message that Dr. Baird Cancer completed and faxed back the pt's [employer] disability form and that a copy will be placed up front for pickup and a copy will be kept for the pt's chart.

## 2019-09-17 ENCOUNTER — Other Ambulatory Visit: Payer: Self-pay

## 2019-09-17 NOTE — Telephone Encounter (Signed)
The pt was notified that Dr. Baird Cancer has samples of Ubrelvy for the pt to pickup and to take 1 tab as needed.

## 2019-09-21 ENCOUNTER — Telehealth: Payer: Self-pay

## 2019-09-21 NOTE — Telephone Encounter (Signed)
The patient was asked if she could bring a copy of her disability form by the office so that I could refax it to [employer] because a Cecilie Lowers called and said that they didn't receive it last week, the pt said yes.  I asked her if she wanted the fax number to fax it to save her a trip by the office and the pt said yes she would fax the form her self.  I gave her both fax numbers that was left on the voicemail [phone] and the one directly on Craig's desk [phone].

## 2019-10-04 ENCOUNTER — Other Ambulatory Visit: Payer: Self-pay | Admitting: Internal Medicine

## 2019-11-10 ENCOUNTER — Other Ambulatory Visit: Payer: Self-pay | Admitting: Internal Medicine

## 2019-11-11 ENCOUNTER — Other Ambulatory Visit: Payer: Self-pay | Admitting: Internal Medicine

## 2019-11-13 ENCOUNTER — Other Ambulatory Visit: Payer: Self-pay | Admitting: Internal Medicine

## 2019-11-18 ENCOUNTER — Ambulatory Visit: Payer: BC Managed Care – PPO | Admitting: Internal Medicine

## 2019-11-23 ENCOUNTER — Encounter: Payer: Self-pay | Admitting: Internal Medicine

## 2019-11-23 ENCOUNTER — Ambulatory Visit: Payer: BC Managed Care – PPO | Admitting: Internal Medicine

## 2019-11-23 ENCOUNTER — Other Ambulatory Visit: Payer: Self-pay

## 2019-11-23 VITALS — BP 136/88 | HR 82 | Temp 97.6°F | Ht 65.0 in | Wt 220.2 lb

## 2019-11-23 DIAGNOSIS — I129 Hypertensive chronic kidney disease with stage 1 through stage 4 chronic kidney disease, or unspecified chronic kidney disease: Secondary | ICD-10-CM | POA: Diagnosis not present

## 2019-11-23 DIAGNOSIS — IMO0002 Reserved for concepts with insufficient information to code with codable children: Secondary | ICD-10-CM

## 2019-11-23 DIAGNOSIS — E1122 Type 2 diabetes mellitus with diabetic chronic kidney disease: Secondary | ICD-10-CM | POA: Diagnosis not present

## 2019-11-23 DIAGNOSIS — G43709 Chronic migraine without aura, not intractable, without status migrainosus: Secondary | ICD-10-CM | POA: Diagnosis not present

## 2019-11-23 DIAGNOSIS — N182 Chronic kidney disease, stage 2 (mild): Secondary | ICD-10-CM

## 2019-11-23 DIAGNOSIS — E1165 Type 2 diabetes mellitus with hyperglycemia: Secondary | ICD-10-CM

## 2019-11-23 DIAGNOSIS — Z6836 Body mass index (BMI) 36.0-36.9, adult: Secondary | ICD-10-CM

## 2019-11-23 LAB — BMP8+EGFR
BUN/Creatinine Ratio: 14 (ref 12–28)
BUN: 13 mg/dL (ref 8–27)
CO2: 24 mmol/L (ref 20–29)
Calcium: 9.8 mg/dL (ref 8.7–10.3)
Chloride: 100 mmol/L (ref 96–106)
Creatinine, Ser: 0.96 mg/dL (ref 0.57–1.00)
GFR calc Af Amer: 74 mL/min/{1.73_m2} (ref >59)
GFR calc non Af Amer: 64 mL/min/{1.73_m2} (ref >59)
Glucose: 118 mg/dL — ABNORMAL HIGH (ref 65–99)
Potassium: 4.9 mmol/L (ref 3.5–5.2)
Sodium: 139 mmol/L (ref 134–144)

## 2019-11-23 LAB — HEMOGLOBIN A1C
Est. average glucose Bld gHb Est-mCnc: 183 mg/dL
Hgb A1c MFr Bld: 8 % — ABNORMAL HIGH (ref 4.8–5.6)

## 2019-11-23 MED ORDER — NURTEC 75 MG PO TBDP: 75.0000 mg | ORAL_TABLET | Freq: Every day | ORAL | 0 refills | Status: DC | PRN

## 2019-11-23 NOTE — Patient Instructions (Signed)

## 2019-11-23 NOTE — Progress Notes (Signed)
This visit occurred during the SARS-CoV-2 public health emergency.  Safety protocols were in place, including screening questions prior to the visit, additional usage of staff PPE, and extensive cleaning of exam room while observing appropriate contact time as indicated for disinfecting solutions.  Subjective:     Patient ID: Brittany Rubio , female    DOB: 1960-11-26 , 59 y.o.   MRN: [id number]   Chief Complaint  Patient presents with  . Diabetes  . Hypertension    HPI  She presents today for diabetes check. She reports compliance with meds.   Diabetes She presents for her follow-up diabetic visit. She has type 2 diabetes mellitus. Her disease course has been stable. Hypoglycemia symptoms include headaches. Pertinent negatives for diabetes include no blurred vision and no chest pain. There are no hypoglycemic complications. Diabetic complications include nephropathy. Risk factors for coronary artery disease include diabetes mellitus, dyslipidemia, hypertension, obesity, sedentary lifestyle, post-menopausal and stress. Her weight is fluctuating minimally. She is following a diabetic diet.  Hypertension This is a chronic problem. The current episode started more than 1 year ago. The problem has been gradually improving since onset. The problem is uncontrolled. Associated symptoms include headaches. Pertinent negatives include no blurred vision, chest pain, palpitations or shortness of breath.     Past Medical History:  Diagnosis Date  . ADD (attention deficit disorder)   . Anemia   . Anxiety   . Arthritis    "left shoulder" (07/07/2014)  . Back pain   . Breast cancer ([place])    "left"  . Depression   . Fatty liver   . Food allergy    shellfish  . GERD (gastroesophageal reflux disease)    "takes over the counters as needed"  . Hernia, umbilical   . High cholesterol   . Hypertension 11/26/2011   sees Dr. Bryon Lions  . Hypothyroidism   . Incisional hernia without  obstruction or gangrene 06/05/2018  . Joint pain   . Migraines    "maybe once q 3 months" (07/07/2014)  . Multinodular thyroid 2015  . OSA on CPAP   . Personal history of radiation therapy   . Pneumonia    May 2018  . Sepsis due to Streptococcus pneumoniae ([place])   . Septic shock ([place])   . Severe sepsis ([place]) 06/05/2019  . Thyroid cancer ([place])    2015  . Type II diabetes mellitus ([place])   . Vitamin D deficiency      Family History  Problem Relation Age of Onset  . Cancer Mother        Pancreatic  . Diabetes Mother   . Hypertension Mother   . Depression Mother   . Hypertension Father   . Alcoholism Father      Current Outpatient Medications:  .  albuterol (VENTOLIN HFA) 108 (90 Base) MCG/ACT inhaler, Inhale 2 puffs into the lungs every 6 (six) hours as needed for wheezing or shortness of breath., Disp: 8 g, Rfl: 0 .  amphetamine-dextroamphetamine (ADDERALL XR) 20 MG 24 hr capsule, Take 20 mg by mouth daily. 1 per day with the 10 mg and 30 mg xr, Disp: , Rfl:  .  amphetamine-dextroamphetamine (ADDERALL XR) 30 MG 24 hr capsule, Take 30 mg by mouth daily. Take 1 capsule with the 10 mg and the 20 mg xr, Disp: , Rfl:  .  amphetamine-dextroamphetamine (ADDERALL) 10 MG tablet, Take 10 mg by mouth daily with breakfast. Take 1 per day with the 20 mg xr and the 30  mg xr, Disp: , Rfl:  .  apixaban (ELIQUIS) 5 MG TABS tablet, Take 1 tablet (5 mg total) by mouth 2 (two) times daily., Disp: 180 tablet, Rfl: 1 .  benzonatate (TESSALON PERLES) 100 MG capsule, Take 1 capsule (100 mg total) by mouth 3 (three) times daily as needed., Disp: 20 capsule, Rfl: 0 .  cholecalciferol (VITAMIN D3) 25 MCG (1000 UT) tablet, Take 1,000 Units by mouth daily., Disp: , Rfl:  .  Cyanocobalamin (VITAMIN B-12 PO), Take 1 tablet by mouth once a week. Tuesdays, Disp: , Rfl:  .  diazepam (VALIUM) 5 MG tablet, TAKE 1 TABLET BY MOUTH EVERY DAY AS NEEDED FOR ANXIETY, Disp: 30 tablet, Rfl: 0 .  EPINEPHrine (EPIPEN 2-PAK)  0.3 mg/0.3 mL IJ SOAJ injection, 0.3 mLs by Subdermal route once as needed. Allergic reaction, Disp: , Rfl:  .  famotidine (PEPCID) 20 MG tablet, Take 20 mg by mouth as needed for heartburn or indigestion., Disp: , Rfl:  .  FARXIGA 10 MG TABS tablet, TAKE 1 TABLET BY MOUTH EVERY DAY, Disp: 90 tablet, Rfl: 0 .  fexofenadine (ALLEGRA) 180 MG tablet, Take 180 mg by mouth daily as needed for allergies or rhinitis., Disp: , Rfl:  .  glucose blood (ONETOUCH ULTRA) test strip, Test twice daily, Disp: 100 each, Rfl: 0 .  JANUMET 50-1000 MG tablet, TAKE 1 TABLET BY MOUTH TWICE A DAY WITH MEALS, Disp: 180 tablet, Rfl: 1 .  Lancets (ONETOUCH ULTRASOFT) lancets, Test twice daily, Disp: 100 each, Rfl: 0 .  levothyroxine (SYNTHROID, LEVOTHROID) 112 MCG tablet, Take 224 mcg by mouth every morning. Take 2 tabs Monday - Saturday , take 1 tablet on Sunday, Disp: , Rfl:  .  lisinopril-hydrochlorothiazide (ZESTORETIC) 20-25 MG tablet, TAKE 1 TABLET BY MOUTH EVERY DAY, Disp: 90 tablet, Rfl: 1 .  Magnesium 500 MG CAPS, Take 500 mg by mouth daily. , Disp: , Rfl:  .  omega-3 acid ethyl esters (LOVAZA) 1 g capsule, Take 1 g by mouth daily. , Disp: , Rfl:  .  pravastatin (PRAVACHOL) 80 MG tablet, Take one tab po M-F, skip weekends1, Disp: 90 tablet, Rfl: 1 .  promethazine (PHENERGAN) 25 MG tablet, TAKE 1 TABLET BY MOUTH EVERY 4-6 HOURS AS NEEDED, Disp: 30 tablet, Rfl: 1 .  TIADYLT ER 240 MG 24 hr capsule, TAKE 1 TABLET BY MOUTH AT BEDTIME FOR BP, Disp: 90 capsule, Rfl: 1 .  traZODone (DESYREL) 50 MG tablet, TAKE 1 TABLET AT BEDTIME AFTER A MEAL (Patient taking differently: TAKE 2 TABLETS AT BEDTIME AFTER A MEAL), Disp: 90 tablet, Rfl: 2 .  TRINTELLIX 10 MG TABS tablet, TAKE 1 TABLET BY MOUTH EVERY DAY, Disp: 90 tablet, Rfl: 0 .  Ubrogepant (UBRELVY) 50 MG TABS, Take by mouth. 1 tab prn, Disp: , Rfl:  .  vitamin C (ASCORBIC ACID) 500 MG tablet, Take 500 mg by mouth daily., Disp: , Rfl:  .  Rimegepant Sulfate (NURTEC) 75 MG  TBDP, Take 75 mg by mouth daily as needed., Disp: 30 tablet, Rfl: 0   Allergies  Allergen Reactions  . Crab [Shellfish Allergy] Hives and Swelling    "Only when I eat too many crab legs" Can tolerate CT scans with contrast-does not need premeds       Review of Systems  Constitutional: Negative.   Eyes: Negative for blurred vision.  Respiratory: Negative.  Negative for shortness of breath.   Cardiovascular: Negative.  Negative for chest pain and palpitations.  Gastrointestinal: Negative.   Neurological:  Positive for headaches.       She has h/o migraines. Stadol works for her. Ubrelvy 50 has been ineffective.   Psychiatric/Behavioral: Negative.      Today's Vitals   11/23/19 0947  BP: 136/88  Pulse: 82  Temp: 97.6 F (36.4 C)  TempSrc: Oral  Weight: 220 lb 3.2 oz (99.9 kg)  Height: 5' 5"  (1.651 m)   Body mass index is 36.64 kg/m.   Objective:  Physical Exam Vitals and nursing note reviewed.  Constitutional:      Appearance: Normal appearance. She is obese.  HENT:     Head: Normocephalic and atraumatic.  Cardiovascular:     Rate and Rhythm: Normal rate and regular rhythm.     Heart sounds: Normal heart sounds.  Pulmonary:     Effort: Pulmonary effort is normal.     Breath sounds: Normal breath sounds.  Skin:    General: Skin is warm.  Neurological:     General: No focal deficit present.     Mental Status: She is alert.  Psychiatric:        Mood and Affect: Mood normal.        Behavior: Behavior normal.         Assessment And Plan:     1. Uncontrolled diabetes mellitus with stage 2 chronic kidney disease (HCC)  Chronic, I will check labs as listed below. I will adjust meds as needed. Encouraged to check BS daily.   - BMP8+EGFR - Hemoglobin A1c  2. Hypertensive nephropathy  Chronic, fair control. She will continue with current meds.  She is encouraged to avoid adding salt to her foods.   3. Chronic migraine without aura without status migrainosus,  not intractable  Chronic. She may benefit from daily magnesium supplementation.  She was given samples of Nurtec to take daily as needed. She has been using Stadol NS prn migraines for years and has found this is the most effective medication for her migraines.   4. Class 2 severe obesity due to excess calories with serious comorbidity and body mass index (BMI) of 36.0 to 36.9 in adult [facility])  She is encouraged to incorporate more exercise into her daily routine. Advised to aim for at least 150 minutes per week.   [name], MD    THE PATIENT IS ENCOURAGED TO PRACTICE SOCIAL DISTANCING DUE TO THE COVID-19 PANDEMIC.

## 2019-12-31 ENCOUNTER — Other Ambulatory Visit: Payer: Self-pay

## 2019-12-31 MED ORDER — VORTIOXETINE HBR 20 MG PO TABS: 20.0000 mg | ORAL_TABLET | Freq: Every day | ORAL | 1 refills | Status: DC

## 2020-01-04 ENCOUNTER — Telehealth: Payer: Self-pay

## 2020-01-04 ENCOUNTER — Other Ambulatory Visit: Payer: Self-pay | Admitting: Internal Medicine

## 2020-01-04 NOTE — Telephone Encounter (Signed)
Trintellix 20 mg prior auth done. Response was it's too soon for refill at the pharmacy.

## 2020-01-04 NOTE — Telephone Encounter (Signed)
The pt was scheduled for a 4 weeks f/u for trintellix.

## 2020-01-18 ENCOUNTER — Other Ambulatory Visit: Payer: Self-pay | Admitting: Internal Medicine

## 2020-01-19 NOTE — Telephone Encounter (Signed)
Diazepam refill 

## 2020-01-20 ENCOUNTER — Other Ambulatory Visit: Payer: Self-pay | Admitting: Internal Medicine

## 2020-01-20 DIAGNOSIS — C50919 Malignant neoplasm of unspecified site of unspecified female breast: Secondary | ICD-10-CM

## 2020-01-30 ENCOUNTER — Other Ambulatory Visit: Payer: Self-pay | Admitting: Nurse Practitioner

## 2020-02-02 ENCOUNTER — Other Ambulatory Visit: Payer: Self-pay | Admitting: Internal Medicine

## 2020-02-02 ENCOUNTER — Other Ambulatory Visit: Payer: Self-pay

## 2020-02-02 MED ORDER — VORTIOXETINE HBR 20 MG PO TABS: 20.0000 mg | ORAL_TABLET | Freq: Every day | ORAL | 1 refills | Status: DC

## 2020-02-15 ENCOUNTER — Ambulatory Visit: Payer: BC Managed Care – PPO | Admitting: Internal Medicine

## 2020-02-15 ENCOUNTER — Encounter: Payer: Self-pay | Admitting: Internal Medicine

## 2020-02-15 ENCOUNTER — Other Ambulatory Visit: Payer: Self-pay

## 2020-02-15 VITALS — BP 124/82 | HR 107 | Temp 98.3°F | Ht 65.0 in | Wt 219.6 lb

## 2020-02-15 DIAGNOSIS — F320 Major depressive disorder, single episode, mild: Secondary | ICD-10-CM

## 2020-02-15 DIAGNOSIS — N182 Chronic kidney disease, stage 2 (mild): Secondary | ICD-10-CM

## 2020-02-15 DIAGNOSIS — I129 Hypertensive chronic kidney disease with stage 1 through stage 4 chronic kidney disease, or unspecified chronic kidney disease: Secondary | ICD-10-CM

## 2020-02-15 DIAGNOSIS — E1122 Type 2 diabetes mellitus with diabetic chronic kidney disease: Secondary | ICD-10-CM

## 2020-02-15 DIAGNOSIS — E1165 Type 2 diabetes mellitus with hyperglycemia: Secondary | ICD-10-CM

## 2020-02-15 DIAGNOSIS — Z1231 Encounter for screening mammogram for malignant neoplasm of breast: Secondary | ICD-10-CM

## 2020-02-15 DIAGNOSIS — Z6836 Body mass index (BMI) 36.0-36.9, adult: Secondary | ICD-10-CM

## 2020-02-15 DIAGNOSIS — G43809 Other migraine, not intractable, without status migrainosus: Secondary | ICD-10-CM | POA: Diagnosis not present

## 2020-02-15 DIAGNOSIS — IMO0002 Reserved for concepts with insufficient information to code with codable children: Secondary | ICD-10-CM

## 2020-02-15 MED ORDER — NURTEC 75 MG PO TBDP: 75.0000 mg | ORAL_TABLET | Freq: Every day | ORAL | 2 refills | Status: DC | PRN

## 2020-02-15 NOTE — Progress Notes (Signed)
I,[name],acting as a Education administrator for [name], MD.,have documented all relevant documentation on the behalf of [name], MD,as directed by  [name], MD while in the presence of [name], MD.  This visit occurred during the SARS-CoV-2 public health emergency.  Safety protocols were in place, including screening questions prior to the visit, additional usage of staff PPE, and extensive cleaning of exam room while observing appropriate contact time as indicated for disinfecting solutions.  Subjective:     Patient ID: Brittany Rubio , female    DOB: Jun 29, 1961 , 59 y.o.   MRN: [id number]   Chief Complaint  Patient presents with  . Trintillix f/u    HPI  She presents today for f/u depression. She reports compliance with Trintellix. She reports she does feel better on 75m dose. However, she admits that she felt her best while on Viibryd 464m She had to stop the medication because her insurance did not approve the medicaition.     Past Medical History:  Diagnosis Date  . ADD (attention deficit disorder)   . Anemia   . Anxiety   . Arthritis    "left shoulder" (07/07/2014)  . Back pain   . Breast cancer ([facility]   "left"  . Depression   . Fatty liver   . Food allergy    shellfish  . GERD (gastroesophageal reflux disease)    "takes over the counters as needed"  . Hernia, umbilical   . High cholesterol   . Hypertension 11/26/2011   sees Dr. RoBryon Lions. Hypothyroidism   . Incisional hernia without obstruction or gangrene 06/05/2018  . Joint pain   . Migraines    "maybe once q 3 months" (07/07/2014)  . Multinodular thyroid 2015  . OSA on CPAP   . Personal history of radiation therapy   . Pneumonia    May 2018  . Sepsis due to Streptococcus pneumoniae ([facility]  . Septic shock ([facility]  . Severe sepsis ([facility]/20/2020  . Thyroid cancer ([facility]   2015  . Type II diabetes mellitus ([place]  . Vitamin D deficiency      Family History   Problem Relation Age of Onset  . Cancer Mother        Pancreatic  . Diabetes Mother   . Hypertension Mother   . Depression Mother   . Hypertension Father   . Alcoholism Father      Current Outpatient Medications:  .  albuterol (VENTOLIN HFA) 108 (90 Base) MCG/ACT inhaler, Inhale 2 puffs into the lungs every 6 (six) hours as needed for wheezing or shortness of breath., Disp: 8 g, Rfl: 0 .  amphetamine-dextroamphetamine (ADDERALL XR) 20 MG 24 hr capsule, Take 20 mg by mouth daily. 1 per day with the 10 mg and 30 mg xr, Disp: , Rfl:  .  amphetamine-dextroamphetamine (ADDERALL XR) 30 MG 24 hr capsule, Take 30 mg by mouth daily. Take 1 capsule with the 10 mg and the 20 mg xr, Disp: , Rfl:  .  amphetamine-dextroamphetamine (ADDERALL) 10 MG tablet, Take 10 mg by mouth daily with breakfast. Take 1 per day with the 20 mg xr and the 30 mg xr, Disp: , Rfl:  .  benzonatate (TESSALON PERLES) 100 MG capsule, Take 1 capsule (100 mg total) by mouth 3 (three) times daily as needed., Disp: 20 capsule, Rfl: 0 .  cholecalciferol (VITAMIN D3) 25 MCG (1000 UT) tablet, Take 1,000 Units by mouth  daily., Disp: , Rfl:  .  Cyanocobalamin (VITAMIN B-12 PO), Take 1 tablet by mouth once a week. Tuesdays, Disp: , Rfl:  .  diazepam (VALIUM) 5 MG tablet, TAKE 1 TABLET BY MOUTH EVERY DAY AS NEEDED FOR ANXIETY, Disp: 30 tablet, Rfl: 0 .  ELIQUIS 5 MG TABS tablet, TAKE 1 TABLET BY MOUTH TWICE A DAY, Disp: 180 tablet, Rfl: 1 .  EPINEPHrine (EPIPEN 2-PAK) 0.3 mg/0.3 mL IJ SOAJ injection, 0.3 mLs by Subdermal route once as needed. Allergic reaction, Disp: , Rfl:  .  famotidine (PEPCID) 20 MG tablet, Take 20 mg by mouth as needed for heartburn or indigestion., Disp: , Rfl:  .  FARXIGA 10 MG TABS tablet, TAKE 1 TABLET BY MOUTH EVERY DAY, Disp: 90 tablet, Rfl: 0 .  fexofenadine (ALLEGRA) 180 MG tablet, Take 180 mg by mouth daily as needed for allergies or rhinitis., Disp: , Rfl:  .  glucose blood (ONETOUCH ULTRA) test strip, Test  twice daily, Disp: 100 each, Rfl: 0 .  JANUMET 50-1000 MG tablet, TAKE 1 TABLET BY MOUTH TWICE A DAY WITH MEALS, Disp: 180 tablet, Rfl: 1 .  Lancets (ONETOUCH ULTRASOFT) lancets, Test twice daily, Disp: 100 each, Rfl: 0 .  levothyroxine (SYNTHROID, LEVOTHROID) 112 MCG tablet, Take 224 mcg by mouth every morning. Take 2 tabs Monday - Saturday , take 1 tablet on Sunday, Disp: , Rfl:  .  lisinopril-hydrochlorothiazide (ZESTORETIC) 20-25 MG tablet, TAKE 1 TABLET BY MOUTH EVERY DAY, Disp: 90 tablet, Rfl: 1 .  omega-3 acid ethyl esters (LOVAZA) 1 g capsule, Take 1 g by mouth daily. , Disp: , Rfl:  .  pravastatin (PRAVACHOL) 80 MG tablet, Take one tab po M-F, skip weekends1, Disp: 90 tablet, Rfl: 1 .  promethazine (PHENERGAN) 25 MG tablet, TAKE 1 TABLET BY MOUTH EVERY 4-6 HOURS AS NEEDED, Disp: 30 tablet, Rfl: 1 .  Rimegepant Sulfate (NURTEC) 75 MG TBDP, Take 75 mg by mouth daily as needed., Disp: 30 tablet, Rfl: 0 .  TIADYLT ER 240 MG 24 hr capsule, TAKE 1 TABLET BY MOUTH AT BEDTIME FOR BP, Disp: 90 capsule, Rfl: 1 .  traZODone (DESYREL) 50 MG tablet, TAKE 1 TABLET AT BEDTIME AFTER A MEAL (Patient taking differently: TAKE 2 TABLETS AT BEDTIME AFTER A MEAL), Disp: 90 tablet, Rfl: 2 .  vitamin C (ASCORBIC ACID) 500 MG tablet, Take 500 mg by mouth daily., Disp: , Rfl:  .  vortioxetine HBr (TRINTELLIX) 20 MG TABS tablet, Take 1 tablet (20 mg total) by mouth daily., Disp: 90 tablet, Rfl: 1 .  Magnesium 500 MG CAPS, Take 500 mg by mouth daily.  (Patient not taking: Reported on 02/15/2020), Disp: , Rfl:    Allergies  Allergen Reactions  . Crab [Shellfish Allergy] Hives and Swelling    "Only when I eat too many crab legs" Can tolerate CT scans with contrast-does not need premeds       Review of Systems  Constitutional: Negative.   Respiratory: Negative.   Cardiovascular: Negative.   Gastrointestinal: Negative.   Psychiatric/Behavioral: Negative.   All other systems reviewed and are negative.     Today's Vitals   02/15/20 1431  BP: 124/82  Pulse: (!) 107  Temp: 98.3 F (36.8 C)  TempSrc: Oral  Weight: 219 lb 9.6 oz (99.6 kg)  Height: _0  (1.651 m)   Body mass index is 36.54 kg/m.  Wt Readings from Last 3 Encounters:  02/15/20 219 lb 9.6 oz (99.6 kg)  11/23/19 220 lb 3.2 oz (  99.9 kg)  08/20/19 215 lb (97.5 kg)   Objective:  Physical Exam Vitals and nursing note reviewed.  Constitutional:      Appearance: Normal appearance. She is obese.  HENT:     Head: Normocephalic and atraumatic.  Cardiovascular:     Rate and Rhythm: Normal rate and regular rhythm.     Heart sounds: Normal heart sounds.  Pulmonary:     Breath sounds: Normal breath sounds.  Skin:    General: Skin is warm.  Neurological:     General: No focal deficit present.     Mental Status: She is alert and oriented to person, place, and time.         Assessment And Plan:     1. Major depressive disorder, single episode, mild (HCC) Comments: Chronic, improved with Trintellis. However, she feels better with viibryd. She agrees to continue with Trintellix for now, but I will send Viibryd to the pharmacy to try to get this approved.   2. 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 I will send rx to the pharmacy for more sensors.  - BMP8+EGFR - Hemoglobin A1c  3. Hypertensive nephropathy  Comments: Chronic, well controlled. She will continue with current meds. She is encouraged to avoid adding salt to her foods.   4. Other migraine without status migrainosus, not intractable  Comments: She was given sample/rx for Nurtec 65m. She has found this to be more effective than Ubrlevy. She was started on this medication to hel pdecrease her use of Stadol.  5. Class 2 severe obesity due to excess calories with serious comorbidity and body mass index (BMI) of 36.0 to 36.9 in adult  ([facility]  Comments: She is encouraged to strive for BMI less than 30 to decrease cardiac risk. Advised to aim for at least 150 minutes of exercise per week.  6. Encounter for screening mammogram for malignant neoplasm of breast  Comments: I will refer her for annual mammogram. She has h/o breast cancer. She is agreeable to treatment plan.     Patient was given opportunity to ask questions. Patient verbalized understanding of the plan and was able to repeat key elements of the plan. All questions were answered to their satisfaction.  RMaximino Greenland MD   I, RMaximino Greenland MD, have reviewed all documentation for this visit. The documentation on 02/15/20 for the exam, diagnosis, procedures, and orders are all accurate and complete.  THE PATIENT IS ENCOURAGED TO PRACTICE SOCIAL DISTANCING DUE TO THE COVID-19 PANDEMIC.

## 2020-02-15 NOTE — Patient Instructions (Signed)

## 2020-02-16 ENCOUNTER — Encounter: Payer: Self-pay | Admitting: Internal Medicine

## 2020-02-16 LAB — BMP8+EGFR
BUN/Creatinine Ratio: 19 (ref 12–28)
BUN: 17 mg/dL (ref 8–27)
CO2: 24 mmol/L (ref 20–29)
Calcium: 9.3 mg/dL (ref 8.7–10.3)
Chloride: 99 mmol/L (ref 96–106)
Creatinine, Ser: 0.9 mg/dL (ref 0.57–1.00)
GFR calc Af Amer: 80 mL/min/{1.73_m2} (ref >59)
GFR calc non Af Amer: 70 mL/min/{1.73_m2} (ref >59)
Glucose: 161 mg/dL — ABNORMAL HIGH (ref 65–99)
Potassium: 3.9 mmol/L (ref 3.5–5.2)
Sodium: 139 mmol/L (ref 134–144)

## 2020-02-16 LAB — HEMOGLOBIN A1C
Est. average glucose Bld gHb Est-mCnc: 171 mg/dL
Hgb A1c MFr Bld: 7.6 % — ABNORMAL HIGH (ref 4.8–5.6)

## 2020-03-02 ENCOUNTER — Other Ambulatory Visit: Payer: Self-pay | Admitting: Internal Medicine

## 2020-03-03 ENCOUNTER — Ambulatory Visit: Payer: BC Managed Care – PPO | Admitting: Internal Medicine

## 2020-03-04 ENCOUNTER — Other Ambulatory Visit: Payer: Self-pay | Admitting: Internal Medicine

## 2020-03-04 DIAGNOSIS — Z1231 Encounter for screening mammogram for malignant neoplasm of breast: Secondary | ICD-10-CM

## 2020-03-17 ENCOUNTER — Ambulatory Visit
Admission: RE | Admit: 2020-03-17 | Discharge: 2020-03-17 | Disposition: A | Discharge status: Home / Self Care | Payer: BC Managed Care – PPO | Source: Ambulatory Visit | Attending: Internal Medicine | Admitting: Internal Medicine

## 2020-03-17 ENCOUNTER — Other Ambulatory Visit: Payer: Self-pay

## 2020-03-17 DIAGNOSIS — Z1231 Encounter for screening mammogram for malignant neoplasm of breast: Secondary | ICD-10-CM

## 2020-03-23 ENCOUNTER — Other Ambulatory Visit: Payer: Self-pay | Admitting: Internal Medicine

## 2020-03-23 MED ORDER — DIAZEPAM 5 MG PO TABS
ORAL_TABLET | ORAL | 0 refills | Status: DC
Start: 2020-03-23 — End: 2020-08-04

## 2020-04-01 ENCOUNTER — Other Ambulatory Visit: Payer: Self-pay | Admitting: Internal Medicine

## 2020-04-06 ENCOUNTER — Telehealth: Payer: Self-pay

## 2020-04-06 ENCOUNTER — Other Ambulatory Visit: Payer: Self-pay | Admitting: Internal Medicine

## 2020-04-06 MED ORDER — TRAZODONE HCL 100 MG PO TABS: 100.0000 mg | ORAL_TABLET | Freq: Every day | ORAL | 1 refills | Status: DC

## 2020-04-06 NOTE — Telephone Encounter (Signed)
The pt called and said that she needed a new prescription written for her Trazodone because she discussed with Dr. Baird Cancer that 1 pill wasn't helping and the pt was advised to try 2 tabs at bedtime.  The pt said that the 2 at bedtime has been helping.

## 2020-04-15 LAB — HM DIABETES EYE EXAM

## 2020-04-21 ENCOUNTER — Encounter: Payer: Self-pay | Admitting: Internal Medicine

## 2020-05-03 ENCOUNTER — Other Ambulatory Visit: Payer: Self-pay | Admitting: Internal Medicine

## 2020-05-31 ENCOUNTER — Encounter: Payer: BC Managed Care – PPO | Admitting: Internal Medicine

## 2020-06-24 ENCOUNTER — Other Ambulatory Visit: Payer: Self-pay | Admitting: Internal Medicine

## 2020-07-11 ENCOUNTER — Other Ambulatory Visit: Payer: Self-pay

## 2020-07-11 NOTE — Telephone Encounter (Signed)
The pt was notified that samples of nurtec has been placed up front for pickup.  The Stadol is on longer active in the pt's chart.

## 2020-07-16 ENCOUNTER — Other Ambulatory Visit: Payer: Self-pay | Admitting: Internal Medicine

## 2020-07-16 DIAGNOSIS — C50919 Malignant neoplasm of unspecified site of unspecified female breast: Secondary | ICD-10-CM

## 2020-08-04 ENCOUNTER — Other Ambulatory Visit: Payer: Self-pay | Admitting: Internal Medicine

## 2020-08-04 NOTE — Telephone Encounter (Signed)
Please refill patient's prescription YL,RMA 

## 2020-08-07 ENCOUNTER — Other Ambulatory Visit: Payer: Self-pay | Admitting: Internal Medicine

## 2020-08-09 ENCOUNTER — Telehealth: Payer: Self-pay

## 2020-08-09 NOTE — Telephone Encounter (Signed)
Spoke to pt,  She states she has not used cpap since she has had covid, states she plans to start back, I offered to r/s, pt wanted to wait and call us back to r/s

## 2020-08-10 ENCOUNTER — Encounter: Payer: Self-pay | Admitting: Internal Medicine

## 2020-08-10 ENCOUNTER — Ambulatory Visit: Payer: BC Managed Care – PPO | Admitting: Family Medicine

## 2020-08-10 ENCOUNTER — Ambulatory Visit: Payer: BC Managed Care – PPO | Admitting: Internal Medicine

## 2020-08-10 ENCOUNTER — Other Ambulatory Visit: Payer: Self-pay

## 2020-08-10 VITALS — BP 136/84 | HR 45 | Temp 98.0°F | Ht 65.0 in | Wt 217.0 lb

## 2020-08-10 DIAGNOSIS — Z23 Encounter for immunization: Secondary | ICD-10-CM | POA: Diagnosis not present

## 2020-08-10 DIAGNOSIS — I739 Peripheral vascular disease, unspecified: Secondary | ICD-10-CM

## 2020-08-10 DIAGNOSIS — I129 Hypertensive chronic kidney disease with stage 1 through stage 4 chronic kidney disease, or unspecified chronic kidney disease: Secondary | ICD-10-CM | POA: Diagnosis not present

## 2020-08-10 DIAGNOSIS — E890 Postprocedural hypothyroidism: Secondary | ICD-10-CM

## 2020-08-10 DIAGNOSIS — E1122 Type 2 diabetes mellitus with diabetic chronic kidney disease: Secondary | ICD-10-CM

## 2020-08-10 DIAGNOSIS — E7800 Pure hypercholesterolemia, unspecified: Secondary | ICD-10-CM

## 2020-08-10 DIAGNOSIS — E1165 Type 2 diabetes mellitus with hyperglycemia: Secondary | ICD-10-CM

## 2020-08-10 DIAGNOSIS — N182 Chronic kidney disease, stage 2 (mild): Secondary | ICD-10-CM | POA: Diagnosis not present

## 2020-08-10 DIAGNOSIS — Z86718 Personal history of other venous thrombosis and embolism: Secondary | ICD-10-CM

## 2020-08-10 DIAGNOSIS — G4733 Obstructive sleep apnea (adult) (pediatric): Secondary | ICD-10-CM

## 2020-08-10 DIAGNOSIS — Z6836 Body mass index (BMI) 36.0-36.9, adult: Secondary | ICD-10-CM

## 2020-08-10 DIAGNOSIS — Z79899 Other long term (current) drug therapy: Secondary | ICD-10-CM

## 2020-08-10 MED ORDER — VORTIOXETINE HBR 20 MG PO TABS: 20.0000 mg | ORAL_TABLET | Freq: Every day | ORAL | 1 refills | Status: DC

## 2020-08-10 MED ORDER — NURTEC 75 MG PO TBDP: 75.0000 mg | ORAL_TABLET | Freq: Every day | ORAL | 2 refills | Status: DC | PRN

## 2020-08-10 NOTE — Progress Notes (Signed)
Rutherford Nail as a scribe for [name], MD.,have documented all relevant documentation on the behalf of [name], MD,as directed by  [name], MD while in the presence of [name], MD. This visit occurred during the SARS-CoV-2 public health emergency.  Safety protocols were in place, including screening questions prior to the visit, additional usage of staff PPE, and extensive cleaning of exam room while observing appropriate contact time as indicated for disinfecting solutions.  Subjective:     Patient ID: Brittany Rubio , female    DOB: Sep 29, 1960 , 60 y.o.   MRN: [id number]   Chief Complaint  Patient presents with  . Diabetes  . Hyperlipidemia  . Hypertension    HPI  She presents today for diabetes, blood pressure and cholesterol check.  She reports compliance with meds. Denies chest pain, SOB and palpitations. Wants to know if she can stop Eliquis now.   Diabetes She presents for her follow-up diabetic visit. She has type 2 diabetes mellitus. Her disease course has been stable. Hypoglycemia symptoms include headaches. Pertinent negatives for hypoglycemia include no dizziness. Pertinent negatives for diabetes include no blurred vision, no chest pain, no fatigue, no polydipsia, no polyphagia and no polyuria. There are no hypoglycemic complications. Diabetic complications include nephropathy. Risk factors for coronary artery disease include diabetes mellitus, dyslipidemia, hypertension, obesity, sedentary lifestyle, post-menopausal and stress. Her weight is fluctuating minimally. She is following a diabetic diet.  Hypertension This is a chronic problem. The current episode started more than 1 year ago. The problem has been gradually improving since onset. The problem is uncontrolled. Associated symptoms include headaches. Pertinent negatives include no blurred vision, chest pain, palpitations or shortness of breath.     Past Medical History:   Diagnosis Date  . ADD (attention deficit disorder)   . Anemia   . Anxiety   . Arthritis    "left shoulder" (07/07/2014)  . Back pain   . Breast cancer ([place])    "left"  . Depression   . Fatty liver   . Food allergy    shellfish  . GERD (gastroesophageal reflux disease)    "takes over the counters as needed"  . Hernia, umbilical   . High cholesterol   . Hypertension 11/26/2011   sees Dr. Bryon Lions  . Hypothyroidism   . Incisional hernia without obstruction or gangrene 06/05/2018  . Joint pain   . Migraines    "maybe once q 3 months" (07/07/2014)  . Multinodular thyroid 2015  . OSA on CPAP   . Personal history of radiation therapy   . Pneumonia    May 2018  . Sepsis due to Streptococcus pneumoniae ([place])   . Septic shock ([place])   . Severe sepsis ([place]) 06/05/2019  . Thyroid cancer ([place])    2015  . Type II diabetes mellitus ([place])   . Vitamin D deficiency      Family History  Problem Relation Age of Onset  . Cancer Mother        Pancreatic  . Diabetes Mother   . Hypertension Mother   . Depression Mother   . Hypertension Father   . Alcoholism Father      Current Outpatient Medications:  .  albuterol (VENTOLIN HFA) 108 (90 Base) MCG/ACT inhaler, Inhale 2 puffs into the lungs every 6 (six) hours as needed for wheezing or shortness of breath., Disp: 8 g, Rfl: 0 .  amphetamine-dextroamphetamine (ADDERALL XR) 20 MG 24 hr capsule, Take 20 mg  by mouth daily. 1 per day with the 10 mg and 30 mg xr, Disp: , Rfl:  .  amphetamine-dextroamphetamine (ADDERALL XR) 30 MG 24 hr capsule, Take 30 mg by mouth daily. Take 1 capsule with the 10 mg and the 20 mg xr, Disp: , Rfl:  .  amphetamine-dextroamphetamine (ADDERALL) 10 MG tablet, Take 10 mg by mouth daily with breakfast. Take 1 per day with the 20 mg xr and the 30 mg xr, Disp: , Rfl:  .  benzonatate (TESSALON PERLES) 100 MG capsule, Take 1 capsule (100 mg total) by mouth 3 (three) times daily as needed., Disp: 20 capsule, Rfl: 0 .   cholecalciferol (VITAMIN D3) 25 MCG (1000 UT) tablet, Take 1,000 Units by mouth daily., Disp: , Rfl:  .  Cyanocobalamin (VITAMIN B-12 PO), Take 1 tablet by mouth once a week. Tuesdays, Disp: , Rfl:  .  diazepam (VALIUM) 5 MG tablet, TAKE 1 TABLET BY MOUTH EVERY DAY AS NEEDED FOR ANXIETY, Disp: 30 tablet, Rfl: 0 .  ELIQUIS 5 MG TABS tablet, TAKE 1 TABLET BY MOUTH TWICE A DAY (Patient taking differently: Monday - Friday), Disp: 180 tablet, Rfl: 1 .  EPINEPHrine (EPIPEN 2-PAK) 0.3 mg/0.3 mL IJ SOAJ injection, 0.3 mLs by Subdermal route once as needed. Allergic reaction, Disp: , Rfl:  .  famotidine (PEPCID) 20 MG tablet, Take 20 mg by mouth as needed for heartburn or indigestion., Disp: , Rfl:  .  FARXIGA 10 MG TABS tablet, TAKE 1 TABLET BY MOUTH EVERY DAY, Disp: 90 tablet, Rfl: 0 .  fexofenadine (ALLEGRA) 180 MG tablet, Take 180 mg by mouth daily as needed for allergies or rhinitis., Disp: , Rfl:  .  glucose blood (ONETOUCH ULTRA) test strip, Test twice daily, Disp: 100 each, Rfl: 0 .  JANUMET 50-1000 MG tablet, TAKE 1 TABLET BY MOUTH TWICE A DAY WITH MEALS, Disp: 180 tablet, Rfl: 1 .  Lancets (ONETOUCH ULTRASOFT) lancets, Test twice daily, Disp: 100 each, Rfl: 0 .  levothyroxine (SYNTHROID, LEVOTHROID) 112 MCG tablet, Take 224 mcg by mouth every morning. Take 2 tabs Monday - Saturday , take 1 tablet on Sunday, Disp: , Rfl:  .  lisinopril-hydrochlorothiazide (ZESTORETIC) 20-25 MG tablet, TAKE 1 TABLET BY MOUTH EVERY DAY, Disp: 90 tablet, Rfl: 1 .  Magnesium 500 MG CAPS, Take 500 mg by mouth daily.  (Patient not taking: Reported on 02/15/2020), Disp: , Rfl:  .  omega-3 acid ethyl esters (LOVAZA) 1 g capsule, Take 1 g by mouth daily. , Disp: , Rfl:  .  pravastatin (PRAVACHOL) 80 MG tablet, TAKE ONE TAB MON-FRIDAYS, SKIP WEEKENDS, Disp: 90 tablet, Rfl: 1 .  promethazine (PHENERGAN) 25 MG tablet, TAKE 1 TABLET BY MOUTH EVERY 4-6 HOURS AS NEEDED, Disp: 30 tablet, Rfl: 1 .  Rimegepant Sulfate (NURTEC) 75 MG  TBDP, Take 75 mg by mouth daily as needed., Disp: 10 tablet, Rfl: 2 .  TIADYLT ER 240 MG 24 hr capsule, TAKE 1 TABLET BY MOUTH AT BEDTIME FOR BP, Disp: 90 capsule, Rfl: 1 .  traZODone (DESYREL) 100 MG tablet, Take 1 tablet (100 mg total) by mouth at bedtime., Disp: 90 tablet, Rfl: 1 .  TRINTELLIX 20 MG TABS tablet, TAKE 1 TABLET BY MOUTH EVERY DAY, Disp: 90 tablet, Rfl: 1 .  vitamin C (ASCORBIC ACID) 500 MG tablet, Take 500 mg by mouth daily., Disp: , Rfl:  .  vortioxetine HBr (TRINTELLIX) 20 MG TABS tablet, Take 1 tablet (20 mg total) by mouth daily., Disp: 90 tablet, Rfl:  1   Allergies  Allergen Reactions  . Crab [Shellfish Allergy] Hives and Swelling    "Only when I eat too many crab legs" Can tolerate CT scans with contrast-does not need premeds       Review of Systems  Constitutional: Negative.  Negative for fatigue.  HENT: Negative.   Eyes: Negative for blurred vision.  Respiratory: Negative.  Negative for shortness of breath.   Cardiovascular: Negative.  Negative for chest pain and palpitations.  Gastrointestinal: Negative.   Endocrine: Negative for polydipsia, polyphagia and polyuria.  Musculoskeletal: Negative.   Skin: Negative.   Neurological: Positive for headaches. Negative for dizziness.  Psychiatric/Behavioral: Negative.   All other systems reviewed and are negative.    Today's Vitals   08/10/20 1206  BP: 136/84  Pulse: (!) 45  Temp: 98 F (36.7 C)  TempSrc: Oral  Weight: 217 lb (98.4 kg)  Height: 5' 5"  (1.651 m)   Body mass index is 36.11 kg/m.  Wt Readings from Last 3 Encounters:  08/10/20 217 lb (98.4 kg)  02/15/20 219 lb 9.6 oz (99.6 kg)  11/23/19 220 lb 3.2 oz (99.9 kg)   Objective:  Physical Exam Vitals and nursing note reviewed.  Constitutional:      Appearance: Normal appearance. She is obese.  HENT:     Head: Normocephalic and atraumatic.     Nose:     Comments: Masked    Mouth/Throat:     Comments: Masked Cardiovascular:     Rate and  Rhythm: Normal rate and regular rhythm.     Heart sounds: Normal heart sounds.  Pulmonary:     Breath sounds: Normal breath sounds.  Musculoskeletal:     Cervical back: Normal range of motion.  Skin:    General: Skin is warm.  Neurological:     General: No focal deficit present.     Mental Status: She is alert and oriented to person, place, and time.         Assessment And Plan:     1. Type 2 diabetes mellitus with stage 2 chronic kidney disease, without long-term current use of insulin (HCC) Comments: Chronic. I will check labs as listed below. She is encouraged to follow dietary guidelines.  - Lipid panel - CMP14+EGFR - Hemoglobin A1c  2. Pure hypercholesterolemia Comments: Chronic. I will check lipid panel today. Advised to c/w pravastatin for now. I will adjust meds as needed.   3. Hypertensive nephropathy Comments: Chronic, fair control. She is aware that optimal BP is less than 130/80. She is encouraged to aim for at least 150 minutes of exercise per week.   4. OSA (obstructive sleep apnea) Comments: Chronic, she is encouraged to resume CPAP use.  She is advised to aim for at least four hours every night.   5. Class 2 severe obesity due to excess calories with serious comorbidity and body mass index (BMI) of 36.0 to 36.9 in adult [facility]) Comments: She is encouraged to strive for BMI less than 30 to decrease cardiac risk. Advised to aim for at least 150 minutes of exercise per week.   6. Hypothyroidism, postsurgical Comments: I will check thyroid panel. I will be sure to forward her results to Dr. Chalmers Cater for her review.  - TSH - T4, free  7. Need for vaccination Comments: She was given flu vaccine to update her immunization history.   8. Drug therapy Comments: I will check pancreatic enzymes per her request. She is concerned because her Mom has h/o pancreatic cancer.  -  Amylase - Lipase - Vitamin B12  9. History of DVT (deep vein thrombosis) Comments: I will check  D-dimer and venous u/s bilaterally. If neg, will be able to d/c Eliquis.   10. Claudication ([place]) Comments: I will schedule her for ABIs in addition to her venous studies for above diagnosis.   Patient was given opportunity to ask questions. Patient verbalized understanding of the plan and was able to repeat key elements of the plan. All questions were answered to their satisfaction.  [name], MD   I, [name], MD, have reviewed all documentation for this visit. The documentation on 08/18/20 for the exam, diagnosis, procedures, and orders are all accurate and complete.  THE PATIENT IS ENCOURAGED TO PRACTICE SOCIAL DISTANCING DUE TO THE COVID-19 PANDEMIC.

## 2020-08-10 NOTE — Patient Instructions (Addendum)
Diabetes Mellitus and Exercise Exercising regularly is important for overall health, especially for people who have diabetes mellitus. Exercising is not only about losing weight. It has many other health benefits, such as increasing muscle strength and bone density and reducing body fat and stress. This leads to improved fitness, flexibility, and endurance, all of which result in better overall health. What are the benefits of exercise if I have diabetes? Exercise has many benefits for people with diabetes. They include:  Helping to lower and control blood sugar (glucose).  Helping the body to respond better to the hormone insulin by improving insulin sensitivity.  Reducing how much insulin the body needs.  Lowering the risk for heart disease by: ? Lowering "bad" cholesterol and triglyceride levels. ? Increasing "good" cholesterol levels. ? Lowering blood pressure. ? Lowering blood glucose levels. What is my activity plan? Your health care provider or certified diabetes educator can help you make a plan for the type and frequency of exercise that works for you. This is called your activity plan. Be sure to:  Get at least 150 minutes of medium-intensity or high-intensity exercise each week. Exercises may include brisk walking, biking, or water aerobics.  Do stretching and strengthening exercises, such as yoga or weight lifting, at least 2 times a week.  Spread out your activity over at least 3 days of the week.  Get some form of physical activity each day. ? Do not go more than 2 days in a row without some kind of physical activity. ? Avoid being inactive for more than 90 minutes at a time. Take frequent breaks to walk or stretch.  Choose exercises or activities that you enjoy. Set realistic goals.  Start slowly and gradually increase your exercise intensity over time.   How do I manage my diabetes during exercise? Monitor your blood glucose  Check your blood glucose before and  after exercising. If your blood glucose is: ? 240 mg/dL (13.3 mmol/L) or higher before you exercise, check your urine for ketones. These are chemicals created by the liver. If you have ketones in your urine, do not exercise until your blood glucose returns to normal. ? 100 mg/dL (5.6 mmol/L) or lower, eat a snack containing 15-20 grams of carbohydrate. Check your blood glucose 15 minutes after the snack to make sure that your glucose level is above 100 mg/dL (5.6 mmol/L) before you start your exercise.  Know the symptoms of low blood glucose (hypoglycemia) and how to treat it. Your risk for hypoglycemia increases during and after exercise. Follow these tips and your health care provider's instructions  Keep a carbohydrate snack that is fast-acting for use before, during, and after exercise to help prevent or treat hypoglycemia.  Avoid injecting insulin into areas of the body that are going to be exercised. For example, avoid injecting insulin into: ? Your arms, when you are about to play tennis. ? Your legs, when you are about to go jogging.  Keep records of your exercise habits. Doing this can help you and your health care provider adjust your diabetes management plan as needed. Write down: ? Food that you eat before and after you exercise. ? Blood glucose levels before and after you exercise. ? The type and amount of exercise you have done.  Work with your health care provider when you start a new exercise or activity. He or she may need to: ? Make sure that the activity is safe for you. ? Adjust your insulin, other medicines, and food that   you eat.  Drink plenty of water while you exercise. This prevents loss of water (dehydration) and problems caused by a lot of heat in the body (heat stroke).   Where to find more information  American Diabetes Association: www.diabetes.org Summary  Exercising regularly is important for overall health, especially for people who have diabetes  mellitus.  Exercising has many health benefits. It increases muscle strength and bone density and reduces body fat and stress. It also lowers and controls blood glucose.  Your health care provider or certified diabetes educator can help you make an activity plan for the type and frequency of exercise that works for you.  Work with your health care provider to make sure any new activity is safe for you. Also work with your health care provider to adjust your insulin, other medicines, and the food you eat. This information is not intended to replace advice given to you by your health care provider. Make sure you discuss any questions you have with your health care provider. Document Revised: 03/30/2019 Document Reviewed: 03/30/2019 Elsevier Patient Education  2021 [employer] Hypertension, Adult Hypertension is another name for high blood pressure. High blood pressure forces your heart to work harder to pump blood. This can cause problems over time. There are two numbers in a blood pressure reading. There is a top number (systolic) over a bottom number (diastolic). It is best to have a blood pressure that is below 120/80. Healthy choices can help lower your blood pressure, or you may need medicine to help lower it. What are the causes? The cause of this condition is not known. Some conditions may be related to high blood pressure. What increases the risk?  Smoking.  Having type 2 diabetes mellitus, high cholesterol, or both.  Not getting enough exercise or physical activity.  Being overweight.  Having too much fat, sugar, calories, or salt (sodium) in your diet.  Drinking too much alcohol.  Having long-term (chronic) kidney disease.  Having a family history of high blood pressure.  Age. Risk increases with age.  Race. You may be at higher risk if you are African American.  Gender. Men are at higher risk than women before age 45. After age 65, women are at higher risk than  men.  Having obstructive sleep apnea.  Stress. What are the signs or symptoms?  High blood pressure may not cause symptoms. Very high blood pressure (hypertensive crisis) may cause: ? Headache. ? Feelings of worry or nervousness (anxiety). ? Shortness of breath. ? Nosebleed. ? A feeling of being sick to your stomach (nausea). ? Throwing up (vomiting). ? Changes in how you see. ? Very bad chest pain. ? Seizures. How is this treated?  This condition is treated by making healthy lifestyle changes, such as: ? Eating healthy foods. ? Exercising more. ? Drinking less alcohol.  Your health care provider may prescribe medicine if lifestyle changes are not enough to get your blood pressure under control, and if: ? Your top number is above 130. ? Your bottom number is above 80.  Your personal target blood pressure may vary. Follow these instructions at home: Eating and drinking  If told, follow the DASH eating plan. To follow this plan: ? Fill one half of your plate at each meal with fruits and vegetables. ? Fill one fourth of your plate at each meal with whole grains. Whole grains include whole-wheat pasta, brown rice, and whole-grain bread. ? Eat or drink low-fat dairy products, such as skim milk or   low-fat yogurt. ? Fill one fourth of your plate at each meal with low-fat (lean) proteins. Low-fat proteins include fish, chicken without skin, eggs, beans, and tofu. ? Avoid fatty meat, cured and processed meat, or chicken with skin. ? Avoid pre-made or processed food.  Eat less than 1,500 mg of salt each day.  Do not drink alcohol if: ? Your doctor tells you not to drink. ? You are pregnant, may be pregnant, or are planning to become pregnant.  If you drink alcohol: ? Limit how much you use to:  0-1 drink a day for women.  0-2 drinks a day for men. ? Be aware of how much alcohol is in your drink. In the [place] one drink equals one 12 oz bottle of beer (355 mL), one 5 oz glass  of wine (148 mL), or one 1 oz glass of hard liquor (44 mL).   Lifestyle  Work with your doctor to stay at a healthy weight or to lose weight. Ask your doctor what the best weight is for you.  Get at least 30 minutes of exercise most days of the week. This may include walking, swimming, or biking.  Get at least 30 minutes of exercise that strengthens your muscles (resistance exercise) at least 3 days a week. This may include lifting weights or doing Pilates.  Do not use any products that contain nicotine or tobacco, such as cigarettes, e-cigarettes, and chewing tobacco. If you need help quitting, ask your doctor.  Check your blood pressure at home as told by your doctor.  Keep all follow-up visits as told by your doctor. This is important.   Medicines  Take over-the-counter and prescription medicines only as told by your doctor. Follow directions carefully.  Do not skip doses of blood pressure medicine. The medicine does not work as well if you skip doses. Skipping doses also puts you at risk for problems.  Ask your doctor about side effects or reactions to medicines that you should watch for. Contact a doctor if you:  Think you are having a reaction to the medicine you are taking.  Have headaches that keep coming back (recurring).  Feel dizzy.  Have swelling in your ankles.  Have trouble with your vision. Get help right away if you:  Get a very bad headache.  Start to feel mixed up (confused).  Feel weak or numb.  Feel faint.  Have very bad pain in your: ? Chest. ? Belly (abdomen).  Throw up more than once.  Have trouble breathing. Summary  Hypertension is another name for high blood pressure.  High blood pressure forces your heart to work harder to pump blood.  For most people, a normal blood pressure is less than 120/80.  Making healthy choices can help lower blood pressure. If your blood pressure does not get lower with healthy choices, you may need to  take medicine. This information is not intended to replace advice given to you by your health care provider. Make sure you discuss any questions you have with your health care provider. Document Revised: 03/12/2018 Document Reviewed: 03/12/2018 Elsevier Patient Education  2021 [employer] Inc.  

## 2020-08-11 LAB — CMP14+EGFR
ALT: 27 IU/L (ref 0–32)
AST: 28 IU/L (ref 0–40)
Albumin/Globulin Ratio: 1.3 (ref 1.2–2.2)
Albumin: 4.2 g/dL (ref 3.8–4.9)
Alkaline Phosphatase: 61 IU/L (ref 44–121)
BUN/Creatinine Ratio: 17 (ref 12–28)
BUN: 15 mg/dL (ref 8–27)
Bilirubin Total: <0.2 mg/dL (ref 0.0–1.2)
CO2: 25 mmol/L (ref 20–29)
Calcium: 9.5 mg/dL (ref 8.7–10.3)
Chloride: 95 mmol/L — ABNORMAL LOW (ref 96–106)
Creatinine, Ser: 0.89 mg/dL (ref 0.57–1.00)
GFR calc Af Amer: 81 mL/min/{1.73_m2} (ref >59)
GFR calc non Af Amer: 71 mL/min/{1.73_m2} (ref >59)
Globulin, Total: 3.3 g/dL (ref 1.5–4.5)
Glucose: 103 mg/dL — ABNORMAL HIGH (ref 65–99)
Potassium: 4.3 mmol/L (ref 3.5–5.2)
Sodium: 138 mmol/L (ref 134–144)
Total Protein: 7.5 g/dL (ref 6.0–8.5)

## 2020-08-11 LAB — T4, FREE: Free T4: 2.44 ng/dL — ABNORMAL HIGH (ref 0.82–1.77)

## 2020-08-11 LAB — LIPASE: Lipase: 33 U/L (ref 14–72)

## 2020-08-11 LAB — LIPID PANEL
Chol/HDL Ratio: 3 ratio (ref 0.0–4.4)
Cholesterol, Total: 139 mg/dL (ref 100–199)
HDL: 46 mg/dL (ref >39)
LDL Chol Calc (NIH): 75 mg/dL (ref 0–99)
Triglycerides: 95 mg/dL (ref 0–149)
VLDL Cholesterol Cal: 18 mg/dL (ref 5–40)

## 2020-08-11 LAB — TSH: TSH: 0.171 u[IU]/mL — ABNORMAL LOW (ref 0.450–4.500)

## 2020-08-11 LAB — HEMOGLOBIN A1C
Est. average glucose Bld gHb Est-mCnc: 169 mg/dL
Hgb A1c MFr Bld: 7.5 % — ABNORMAL HIGH (ref 4.8–5.6)

## 2020-08-11 LAB — VITAMIN B12: Vitamin B-12: 1588 pg/mL — ABNORMAL HIGH (ref 232–1245)

## 2020-08-11 LAB — AMYLASE: Amylase: 55 U/L (ref 31–110)

## 2020-08-18 ENCOUNTER — Other Ambulatory Visit: Payer: Self-pay

## 2020-08-18 ENCOUNTER — Other Ambulatory Visit: Payer: BC Managed Care – PPO

## 2020-08-18 DIAGNOSIS — Z86718 Personal history of other venous thrombosis and embolism: Secondary | ICD-10-CM

## 2020-08-19 LAB — D-DIMER, QUANTITATIVE: D-DIMER: 0.31 mg/L FEU (ref 0.00–0.49)

## 2020-08-22 ENCOUNTER — Other Ambulatory Visit: Payer: Self-pay | Admitting: Internal Medicine

## 2020-08-23 ENCOUNTER — Other Ambulatory Visit: Payer: Self-pay | Admitting: Endocrinology

## 2020-08-23 DIAGNOSIS — C73 Malignant neoplasm of thyroid gland: Secondary | ICD-10-CM

## 2020-09-04 ENCOUNTER — Other Ambulatory Visit: Payer: Self-pay | Admitting: Internal Medicine

## 2020-09-05 ENCOUNTER — Ambulatory Visit
Admission: RE | Admit: 2020-09-05 | Discharge: 2020-09-05 | Disposition: A | Discharge status: Home / Self Care | Payer: BC Managed Care – PPO | Source: Ambulatory Visit | Attending: Endocrinology | Admitting: Endocrinology

## 2020-09-05 ENCOUNTER — Other Ambulatory Visit: Payer: Self-pay

## 2020-09-05 ENCOUNTER — Telehealth: Payer: Self-pay

## 2020-09-05 ENCOUNTER — Ambulatory Visit (INDEPENDENT_AMBULATORY_CARE_PROVIDER_SITE_OTHER)
Admission: RE | Admit: 2020-09-05 | Discharge: 2020-09-05 | Disposition: A | Discharge status: Home / Self Care | Payer: BC Managed Care – PPO | Source: Ambulatory Visit | Attending: Internal Medicine | Admitting: Internal Medicine

## 2020-09-05 ENCOUNTER — Ambulatory Visit (HOSPITAL_COMMUNITY)
Admission: RE | Admit: 2020-09-05 | Discharge: 2020-09-05 | Disposition: A | Discharge status: Home / Self Care | Payer: BC Managed Care – PPO | Source: Ambulatory Visit | Attending: Internal Medicine | Admitting: Internal Medicine

## 2020-09-05 DIAGNOSIS — C73 Malignant neoplasm of thyroid gland: Secondary | ICD-10-CM

## 2020-09-05 DIAGNOSIS — Z86718 Personal history of other venous thrombosis and embolism: Secondary | ICD-10-CM | POA: Insufficient documentation

## 2020-09-05 DIAGNOSIS — I739 Peripheral vascular disease, unspecified: Secondary | ICD-10-CM | POA: Diagnosis present

## 2020-09-05 NOTE — Telephone Encounter (Signed)
Patient notified that she can stop eliquis

## 2020-09-06 ENCOUNTER — Ambulatory Visit: Payer: BC Managed Care – PPO

## 2020-09-06 VITALS — BP 132/78 | HR 98 | Wt 212.2 lb

## 2020-09-06 DIAGNOSIS — N182 Chronic kidney disease, stage 2 (mild): Secondary | ICD-10-CM

## 2020-09-06 DIAGNOSIS — E1122 Type 2 diabetes mellitus with diabetic chronic kidney disease: Secondary | ICD-10-CM

## 2020-09-06 NOTE — Progress Notes (Signed)
Patient is here today for help with her dexcom meter

## 2020-09-24 ENCOUNTER — Other Ambulatory Visit: Payer: Self-pay | Admitting: Internal Medicine

## 2020-09-30 ENCOUNTER — Other Ambulatory Visit: Payer: Self-pay | Admitting: Internal Medicine

## 2020-11-09 ENCOUNTER — Other Ambulatory Visit: Payer: Self-pay | Admitting: Internal Medicine

## 2020-12-19 ENCOUNTER — Encounter: Payer: Self-pay | Admitting: Internal Medicine

## 2020-12-19 ENCOUNTER — Ambulatory Visit (INDEPENDENT_AMBULATORY_CARE_PROVIDER_SITE_OTHER): Payer: BC Managed Care – PPO | Admitting: Internal Medicine

## 2020-12-19 ENCOUNTER — Other Ambulatory Visit: Payer: Self-pay

## 2020-12-19 VITALS — BP 136/84 | HR 104 | Temp 98.9°F | Ht 65.0 in | Wt 219.2 lb

## 2020-12-19 DIAGNOSIS — E7800 Pure hypercholesterolemia, unspecified: Secondary | ICD-10-CM | POA: Diagnosis not present

## 2020-12-19 DIAGNOSIS — F419 Anxiety disorder, unspecified: Secondary | ICD-10-CM

## 2020-12-19 DIAGNOSIS — I129 Hypertensive chronic kidney disease with stage 1 through stage 4 chronic kidney disease, or unspecified chronic kidney disease: Secondary | ICD-10-CM | POA: Diagnosis not present

## 2020-12-19 DIAGNOSIS — E1122 Type 2 diabetes mellitus with diabetic chronic kidney disease: Secondary | ICD-10-CM

## 2020-12-19 DIAGNOSIS — Z6836 Body mass index (BMI) 36.0-36.9, adult: Secondary | ICD-10-CM

## 2020-12-19 DIAGNOSIS — N182 Chronic kidney disease, stage 2 (mild): Secondary | ICD-10-CM

## 2020-12-19 DIAGNOSIS — R0982 Postnasal drip: Secondary | ICD-10-CM

## 2020-12-19 LAB — POCT GLUCOSE (DEVICE FOR HOME USE): POC Glucose: 252 mg/dl — AB (ref 70–99)

## 2020-12-19 MED ORDER — DAPAGLIFLOZIN PROPANEDIOL 10 MG PO TABS: 10.0000 mg | ORAL_TABLET | Freq: Every day | ORAL | 2 refills | Status: DC

## 2020-12-19 MED ORDER — LEVOCETIRIZINE DIHYDROCHLORIDE 5 MG PO TABS: 5.0000 mg | ORAL_TABLET | Freq: Every evening | ORAL | 1 refills | Status: DC

## 2020-12-19 MED ORDER — DIAZEPAM 5 MG PO TABS: ORAL_TABLET | ORAL | 0 refills | Status: DC

## 2020-12-19 NOTE — Patient Instructions (Signed)
Diabetes Mellitus and Foot Care Foot care is an important part of your health, especially when you have diabetes. Diabetes may cause you to have problems because of poor blood flow (circulation) to your feet and legs, which can cause your skin to:  Become thinner and drier.  Break more easily.  Heal more slowly.  Peel and crack. You may also have nerve damage (neuropathy) in your legs and feet, causing decreased feeling in them. This means that you may not notice minor injuries to your feet that could lead to more serious problems. Noticing and addressing any potential problems early is the best way to prevent future foot problems. How to care for your feet Foot hygiene  Wash your feet daily with warm water and mild soap. Do not use hot water. Then, pat your feet and the areas between your toes until they are completely dry. Do not soak your feet as this can dry your skin.  Trim your toenails straight across. Do not dig under them or around the cuticle. File the edges of your nails with an emery board or nail file.  Apply a moisturizing lotion or petroleum jelly to the skin on your feet and to dry, brittle toenails. Use lotion that does not contain alcohol and is unscented. Do not apply lotion between your toes.   Shoes and socks  Wear clean socks or stockings every day. Make sure they are not too tight. Do not wear knee-high stockings since they may decrease blood flow to your legs.  Wear shoes that fit properly and have enough cushioning. Always look in your shoes before you put them on to be sure there are no objects inside.  To break in new shoes, wear them for just a few hours a day. This prevents injuries on your feet. Wounds, scrapes, corns, and calluses  Check your feet daily for blisters, cuts, bruises, sores, and redness. If you cannot see the bottom of your feet, use a mirror or ask someone for help.  Do not cut corns or calluses or try to remove them with medicine.  If you  find a minor scrape, cut, or break in the skin on your feet, keep it and the skin around it clean and dry. You may clean these areas with mild soap and water. Do not clean the area with peroxide, alcohol, or iodine.  If you have a wound, scrape, corn, or callus on your foot, look at it several times a day to make sure it is healing and not infected. Check for: ? Redness, swelling, or pain. ? Fluid or blood. ? Warmth. ? Pus or a bad smell.   General tips  Do not cross your legs. This may decrease blood flow to your feet.  Do not use heating pads or hot water bottles on your feet. They may burn your skin. If you have lost feeling in your feet or legs, you may not know this is happening until it is too late.  Protect your feet from hot and cold by wearing shoes, such as at the beach or on hot pavement.  Schedule a complete foot exam at least once a year (annually) or more often if you have foot problems. Report any cuts, sores, or bruises to your health care provider immediately. Where to find more information  American Diabetes Association: www.diabetes.org  Association of Diabetes Care & Education Specialists: www.diabeteseducator.org Contact a health care provider if:  You have a medical condition that increases your risk of infection and   you have any cuts, sores, or bruises on your feet.  You have an injury that is not healing.  You have redness on your legs or feet.  You feel burning or tingling in your legs or feet.  You have pain or cramps in your legs and feet.  Your legs or feet are numb.  Your feet always feel cold.  You have pain around any toenails. Get help right away if:  You have a wound, scrape, corn, or callus on your foot and: ? You have pain, swelling, or redness that gets worse. ? You have fluid or blood coming from the wound, scrape, corn, or callus. ? Your wound, scrape, corn, or callus feels warm to the touch. ? You have pus or a bad smell coming from  the wound, scrape, corn, or callus. ? You have a fever. ? You have a red line going up your leg. Summary  Check your feet every day for blisters, cuts, bruises, sores, and redness.  Apply a moisturizing lotion or petroleum jelly to the skin on your feet and to dry, brittle toenails.  Wear shoes that fit properly and have enough cushioning.  If you have foot problems, report any cuts, sores, or bruises to your health care provider immediately.  Schedule a complete foot exam at least once a year (annually) or more often if you have foot problems. This information is not intended to replace advice given to you by your health care provider. Make sure you discuss any questions you have with your health care provider. Document Revised: 01/21/2020 Document Reviewed: 01/21/2020 Elsevier Patient Education  2021 [employer].  

## 2020-12-19 NOTE — Progress Notes (Signed)
I,[name],acting as a Education administrator for [name], MD.,have documented all relevant documentation on the behalf of [name], MD,as directed by  [name], MD while in the presence of [name], MD.  This visit occurred during the SARS-CoV-2 public health emergency.  Safety protocols were in place, including screening questions prior to the visit, additional usage of staff PPE, and extensive cleaning of exam room while observing appropriate contact time as indicated for disinfecting solutions.  Subjective:     Patient ID: Brittany Rubio , female    DOB: [birth year] , 60 y.o.   MRN: [id number]   Chief Complaint  Patient presents with   Diabetes   Hypertension   Hyperlipidemia    HPI  She presents today for diabetes, blood pressure and cholesterol check.  She reports compliance with meds. She was scheduled for a physical today; however, states she needs to get back to work.   Diabetes She presents for her follow-up diabetic visit. She has type 2 diabetes mellitus. Her disease course has been stable. Hypoglycemia symptoms include headaches. Pertinent negatives for hypoglycemia include no dizziness. Pertinent negatives for diabetes include no blurred vision, no chest pain, no fatigue, no polydipsia, no polyphagia and no polyuria. There are no hypoglycemic complications. Diabetic complications include nephropathy. Risk factors for coronary artery disease include diabetes mellitus, dyslipidemia, hypertension, obesity, sedentary lifestyle, post-menopausal and stress. Her weight is fluctuating minimally. She is following a diabetic diet.  Hypertension This is a chronic problem. The current episode started more than 1 year ago. The problem has been gradually improving since onset. The problem is uncontrolled. Associated symptoms include headaches. Pertinent negatives include no blurred vision, chest pain, palpitations or shortness of breath.    Past Medical History:   Diagnosis Date   ADD (attention deficit disorder)    Anemia    Anxiety    Arthritis    "left shoulder" (07/07/2014)   Back pain    Breast cancer ([place])    "left"   Depression    Fatty liver    Food allergy    shellfish   GERD (gastroesophageal reflux disease)    "takes over the counters as needed"   Hernia, umbilical    High cholesterol    Hypertension 11/26/2011   sees Dr. Bryon Lions   Hypothyroidism    Incisional hernia without obstruction or gangrene 06/05/2018   Joint pain    Migraines    "maybe once q 3 months" (07/07/2014)   Multinodular thyroid 2015   OSA on CPAP    Personal history of radiation therapy    Pneumonia    May 2018   Sepsis due to Streptococcus pneumoniae (HCC)    Septic shock (HCC)    Severe sepsis ([place]) 06/05/2019   Thyroid cancer ([place])    2015   Type II diabetes mellitus ([place])    Vitamin D deficiency      Family History  Problem Relation Age of Onset   Cancer Mother        Pancreatic   Diabetes Mother    Hypertension Mother    Depression Mother    Hypertension Father    Alcoholism Father      Current Outpatient Medications:    albuterol (VENTOLIN HFA) 108 (90 Base) MCG/ACT inhaler, Inhale 2 puffs into the lungs every 6 (six) hours as needed for wheezing or shortness of breath., Disp: 8 g, Rfl: 0   amphetamine-dextroamphetamine (ADDERALL XR) 20 MG 24 hr capsule, Take 20 mg  by mouth daily. 1 per day with the 10 mg and 30 mg xr, Disp: , Rfl:    amphetamine-dextroamphetamine (ADDERALL XR) 30 MG 24 hr capsule, Take 30 mg by mouth daily. Take 1 capsule with the 10 mg and the 20 mg xr, Disp: , Rfl:    amphetamine-dextroamphetamine (ADDERALL) 10 MG tablet, Take 10 mg by mouth daily with breakfast. Take 1 per day with the 20 mg xr and the 30 mg xr, Disp: , Rfl:    benzonatate (TESSALON PERLES) 100 MG capsule, Take 1 capsule (100 mg total) by mouth 3 (three) times daily as needed., Disp: 20 capsule, Rfl: 0   cholecalciferol (VITAMIN D3) 25 MCG  (1000 UT) tablet, Take 1,000 Units by mouth daily., Disp: , Rfl:    Cyanocobalamin (VITAMIN B-12 PO), Take 1 tablet by mouth once a week. Tuesdays, Disp: , Rfl:    EPINEPHrine 0.3 mg/0.3 mL IJ SOAJ injection, 0.3 mLs by Subdermal route once as needed. Allergic reaction, Disp: , Rfl:    famotidine (PEPCID) 20 MG tablet, Take 20 mg by mouth as needed for heartburn or indigestion., Disp: , Rfl:    fexofenadine (ALLEGRA) 180 MG tablet, Take 180 mg by mouth daily as needed for allergies or rhinitis., Disp: , Rfl:    glucose blood (ONETOUCH ULTRA) test strip, Test twice daily, Disp: 100 each, Rfl: 0   JANUMET 50-1000 MG tablet, TAKE 1 TABLET BY MOUTH TWICE A DAY WITH MEALS, Disp: 180 tablet, Rfl: 1   Lancets (ONETOUCH ULTRASOFT) lancets, Test twice daily, Disp: 100 each, Rfl: 0   levocetirizine (XYZAL) 5 MG tablet, Take 1 tablet (5 mg total) by mouth every evening., Disp: 30 tablet, Rfl: 1   levothyroxine (SYNTHROID, LEVOTHROID) 112 MCG tablet, Take 224 mcg by mouth every morning. Take 2 tabs Monday - Saturday , take 1 tablet on Sunday, Disp: , Rfl:    lisinopril-hydrochlorothiazide (ZESTORETIC) 20-25 MG tablet, TAKE 1 TABLET BY MOUTH EVERY DAY, Disp: 90 tablet, Rfl: 1   Magnesium 500 MG CAPS, Take 500 mg by mouth daily., Disp: , Rfl:    pravastatin (PRAVACHOL) 80 MG tablet, TAKE ONE TAB MON-FRIDAYS, SKIP WEEKENDS, Disp: 75 tablet, Rfl: 2   promethazine (PHENERGAN) 25 MG tablet, TAKE 1 TABLET BY MOUTH EVERY 4-6 HOURS AS NEEDED, Disp: 30 tablet, Rfl: 1   Rimegepant Sulfate (NURTEC) 75 MG TBDP, Take 75 mg by mouth daily as needed., Disp: 10 tablet, Rfl: 2   TIADYLT ER 240 MG 24 hr capsule, TAKE 1 TABLET BY MOUTH AT BEDTIME FOR BP, Disp: 90 capsule, Rfl: 1   traZODone (DESYREL) 100 MG tablet, TAKE 1 TABLET BY MOUTH EVERYDAY AT BEDTIME, Disp: 90 tablet, Rfl: 1   TRINTELLIX 20 MG TABS tablet, TAKE 1 TABLET BY MOUTH EVERY DAY, Disp: 90 tablet, Rfl: 1   vitamin C (ASCORBIC ACID) 500 MG tablet, Take 500 mg by  mouth daily., Disp: , Rfl:    vortioxetine HBr (TRINTELLIX) 20 MG TABS tablet, Take 1 tablet (20 mg total) by mouth daily., Disp: 90 tablet, Rfl: 1   dapagliflozin propanediol (FARXIGA) 10 MG TABS tablet, Take 1 tablet (10 mg total) by mouth daily., Disp: 90 tablet, Rfl: 2   diazepam (VALIUM) 5 MG tablet, One tab po qd prn, Disp: 30 tablet, Rfl: 0   Allergies  Allergen Reactions   Crab [Shellfish Allergy] Hives and Swelling    "Only when I eat too many crab legs" Can tolerate CT scans with contrast-does not need premeds  Review of Systems  Constitutional: Negative.  Negative for fatigue.  Eyes:  Negative for blurred vision.  Respiratory: Negative.  Negative for shortness of breath.   Cardiovascular: Negative.  Negative for chest pain and palpitations.  Gastrointestinal: Negative.   Endocrine: Negative for polydipsia, polyphagia and polyuria.  Neurological:  Positive for headaches. Negative for dizziness.  Psychiatric/Behavioral: Negative.    All other systems reviewed and are negative.   Today's Vitals   12/19/20 1445  BP: 136/84  Pulse: (!) 104  Temp: 98.9 F (37.2 C)  TempSrc: Oral  Weight: 219 lb 3.2 oz (99.4 kg)  Height: 5' 5"  (1.651 m)   Body mass index is 36.48 kg/m.  Wt Readings from Last 3 Encounters:  12/19/20 219 lb 3.2 oz (99.4 kg)  09/06/20 212 lb 3.2 oz (96.3 kg)  08/10/20 217 lb (98.4 kg)   BP Readings from Last 3 Encounters:  12/19/20 136/84  09/06/20 132/78  08/10/20 136/84   Objective:  Physical Exam Vitals and nursing note reviewed.  Constitutional:      Appearance: Normal appearance. She is obese.  HENT:     Head: Normocephalic and atraumatic.     Nose:     Comments: Masked     Mouth/Throat:     Comments: Masked  Eyes:     Extraocular Movements: Extraocular movements intact.  Cardiovascular:     Rate and Rhythm: Normal rate and regular rhythm.     Heart sounds: Normal heart sounds.  Pulmonary:     Effort: Pulmonary effort is  normal.     Breath sounds: Normal breath sounds.  Skin:    General: Skin is warm.  Neurological:     General: No focal deficit present.     Mental Status: She is alert.  Psychiatric:        Mood and Affect: Mood normal.        Behavior: Behavior normal.        Assessment And Plan:     1. Type 2 diabetes mellitus with stage 2 chronic kidney disease, without long-term current use of insulin (HCC) Comments: Chronic, I will check labs as listed below. I will refill Farxiga.  - BMP8+EGFR - Hemoglobin A1c - POCT Glucose (Device for Home Use)  2. Hypertensive nephropathy Comments: Chronic, I will check renal function today. Advised to follow low sodium diet. She will f/u in 3-4 months for a full physical exam.   3. Pure hypercholesterolemia Comments: LDL 75 Jan 2022. She is encouraged to comply with daily proavastatin use.   4. Anxiety Comments: She was given rx valium to use prn. She will likely benefit from magnesium supplementation as well.   5. Postnasal drip Comments: I will send rx levocetirizine to use nightly prn.   6. Class 2 severe obesity due to excess calories with serious comorbidity and body mass index (BMI) of 36.0 to 36.9 in adult [facility]) Comments: She is encouraged to strive for BMI less than 30 to decrease cardiac risk. Advised to aim for at least 150 minutes of exercise per week  Patient was given opportunity to ask questions. Patient verbalized understanding of the plan and was able to repeat key elements of the  plan. All questions were answered to their satisfaction.   I, [name], MD, have reviewed all documentation for this visit. The documentation on 12/19/20 for the exam, diagnosis, procedures, and orders are all accurate and complete.   IF YOU HAVE BEEN REFERRED TO A SPECIALIST, IT MAY TAKE 1-2 WEEKS  TO SCHEDULE/PROCESS THE REFERRAL. IF YOU HAVE NOT HEARD FROM US/SPECIALIST IN TWO WEEKS, PLEASE GIVE [place] A CALL AT [phone].   THE PATIENT IS  ENCOURAGED TO PRACTICE SOCIAL DISTANCING DUE TO THE COVID-19 PANDEMIC.

## 2020-12-20 LAB — BMP8+EGFR
BUN/Creatinine Ratio: 17 (ref 12–28)
BUN: 18 mg/dL (ref 8–27)
CO2: 24 mmol/L (ref 20–29)
Calcium: 9.9 mg/dL (ref 8.7–10.3)
Chloride: 98 mmol/L (ref 96–106)
Creatinine, Ser: 1.03 mg/dL — ABNORMAL HIGH (ref 0.57–1.00)
Glucose: 178 mg/dL — ABNORMAL HIGH (ref 65–99)
Potassium: 4.6 mmol/L (ref 3.5–5.2)
Sodium: 142 mmol/L (ref 134–144)
eGFR: 62 mL/min/{1.73_m2} (ref >59)

## 2020-12-20 LAB — HEMOGLOBIN A1C
Est. average glucose Bld gHb Est-mCnc: 183 mg/dL
Hgb A1c MFr Bld: 8 % — ABNORMAL HIGH (ref 4.8–5.6)

## 2021-01-12 ENCOUNTER — Other Ambulatory Visit: Payer: Self-pay | Admitting: Internal Medicine

## 2021-01-12 DIAGNOSIS — C50919 Malignant neoplasm of unspecified site of unspecified female breast: Secondary | ICD-10-CM

## 2021-01-28 ENCOUNTER — Other Ambulatory Visit: Payer: Self-pay | Admitting: Internal Medicine

## 2021-02-11 ENCOUNTER — Other Ambulatory Visit: Payer: Self-pay | Admitting: Internal Medicine

## 2021-02-22 ENCOUNTER — Other Ambulatory Visit: Payer: Self-pay

## 2021-02-22 MED ORDER — NURTEC 75 MG PO TBDP: 75.0000 mg | ORAL_TABLET | Freq: Every day | ORAL | 2 refills | Status: DC | PRN

## 2021-02-28 ENCOUNTER — Other Ambulatory Visit: Payer: Self-pay | Admitting: Internal Medicine

## 2021-02-28 DIAGNOSIS — Z1231 Encounter for screening mammogram for malignant neoplasm of breast: Secondary | ICD-10-CM

## 2021-03-21 ENCOUNTER — Ambulatory Visit
Admission: RE | Admit: 2021-03-21 | Discharge: 2021-03-21 | Disposition: A | Discharge status: Home / Self Care | Payer: BC Managed Care – PPO | Source: Ambulatory Visit | Attending: Internal Medicine | Admitting: Internal Medicine

## 2021-03-21 ENCOUNTER — Other Ambulatory Visit: Payer: Self-pay

## 2021-03-21 DIAGNOSIS — Z1231 Encounter for screening mammogram for malignant neoplasm of breast: Secondary | ICD-10-CM

## 2021-03-31 ENCOUNTER — Other Ambulatory Visit: Payer: Self-pay | Admitting: Internal Medicine

## 2021-04-15 LAB — HM DIABETES EYE EXAM

## 2021-04-20 ENCOUNTER — Encounter: Payer: Self-pay | Admitting: Internal Medicine

## 2021-04-26 ENCOUNTER — Other Ambulatory Visit: Payer: Self-pay | Admitting: Internal Medicine

## 2021-05-03 ENCOUNTER — Encounter: Payer: Self-pay | Admitting: Nurse Practitioner

## 2021-05-03 ENCOUNTER — Ambulatory Visit: Payer: BC Managed Care – PPO | Admitting: Nurse Practitioner

## 2021-05-03 ENCOUNTER — Telehealth: Payer: Self-pay

## 2021-05-03 ENCOUNTER — Other Ambulatory Visit: Payer: Self-pay

## 2021-05-03 VITALS — BP 132/78 | HR 86 | Temp 98.7°F | Ht 65.0 in | Wt 220.2 lb

## 2021-05-03 DIAGNOSIS — N182 Chronic kidney disease, stage 2 (mild): Secondary | ICD-10-CM | POA: Diagnosis not present

## 2021-05-03 DIAGNOSIS — F419 Anxiety disorder, unspecified: Secondary | ICD-10-CM

## 2021-05-03 DIAGNOSIS — E1122 Type 2 diabetes mellitus with diabetic chronic kidney disease: Secondary | ICD-10-CM

## 2021-05-03 DIAGNOSIS — I129 Hypertensive chronic kidney disease with stage 1 through stage 4 chronic kidney disease, or unspecified chronic kidney disease: Secondary | ICD-10-CM

## 2021-05-03 MED ORDER — DIAZEPAM 5 MG PO TABS: ORAL_TABLET | ORAL | 3 refills | Status: DC

## 2021-05-03 NOTE — Progress Notes (Signed)
I, [employer] Wiggins,acting as a Education administrator for [employer], FNP.,have documented all relevant documentation on the behalf of Minette Brine, FNP,as directed by  Minette Brine, FNP while in the presence of Minette Brine, [place].  This visit occurred during the SARS-CoV-2 public health emergency.  Safety protocols were in place, including screening questions prior to the visit, additional usage of staff PPE, and extensive cleaning of exam room while observing appropriate contact time as indicated for disinfecting solutions.  Subjective:     Patient ID: Brittany Rubio , female    DOB: [birth year] , 60 y.o.   MRN: [id number]   Chief Complaint  Patient presents with   Anxiety    HPI  Pt here for med refill. Diazepam. She has been having "a lot" of things going on.  Has been taking about once a week.   Anxiety Presents for follow-up visit. Symptoms include nervous/anxious behavior. Patient reports no chest pain, dizziness, dry mouth or palpitations.      Past Medical History:  Diagnosis Date   ADD (attention deficit disorder)    Anemia    Anxiety    Arthritis    "left shoulder" (07/07/2014)   Back pain    Breast cancer ([place])    "left"   Depression    Fatty liver    Food allergy    shellfish   GERD (gastroesophageal reflux disease)    "takes over the counters as needed"   Hernia, umbilical    High cholesterol    Hypertension 11/26/2011   sees Dr. Bryon Lions   Hypothyroidism    Incisional hernia without obstruction or gangrene 06/05/2018   Joint pain    Migraines    "maybe once q 3 months" (07/07/2014)   Multinodular thyroid 2015   OSA on CPAP    Personal history of radiation therapy    Pneumonia    May 2018   Sepsis due to Streptococcus pneumoniae (HCC)    Septic shock (HCC)    Severe sepsis ([place]) 06/05/2019   Thyroid cancer ([place])    2015   Type II diabetes mellitus ([place])    Vitamin D deficiency      Family History  Problem Relation Age of Onset   Cancer Mother         Pancreatic   Diabetes Mother    Hypertension Mother    Depression Mother    Hypertension Father    Alcoholism Father      Current Outpatient Medications:    albuterol (VENTOLIN HFA) 108 (90 Base) MCG/ACT inhaler, Inhale 2 puffs into the lungs every 6 (six) hours as needed for wheezing or shortness of breath., Disp: 8 g, Rfl: 0   amphetamine-dextroamphetamine (ADDERALL XR) 20 MG 24 hr capsule, Take 20 mg by mouth daily. 1 per day with the 10 mg and 30 mg xr, Disp: , Rfl:    amphetamine-dextroamphetamine (ADDERALL XR) 30 MG 24 hr capsule, Take 30 mg by mouth daily. Take 1 capsule with the 10 mg and the 20 mg xr, Disp: , Rfl:    amphetamine-dextroamphetamine (ADDERALL) 10 MG tablet, Take 10 mg by mouth daily with breakfast. Take 1 per day with the 20 mg xr and the 30 mg xr, Disp: , Rfl:    benzonatate (TESSALON PERLES) 100 MG capsule, Take 1 capsule (100 mg total) by mouth 3 (three) times daily as needed., Disp: 20 capsule, Rfl: 0   cholecalciferol (VITAMIN D3) 25 MCG (1000 UT) tablet, Take 1,000 Units by mouth daily., Disp: , Rfl:  Cyanocobalamin (VITAMIN B-12 PO), Take 1 tablet by mouth once a week. Tuesdays, Disp: , Rfl:    dapagliflozin propanediol (FARXIGA) 10 MG TABS tablet, Take 1 tablet (10 mg total) by mouth daily., Disp: 90 tablet, Rfl: 2   diazepam (VALIUM) 5 MG tablet, One tab po qd prn, Disp: 30 tablet, Rfl: 3   EPINEPHrine 0.3 mg/0.3 mL IJ SOAJ injection, 0.3 mLs by Subdermal route once as needed. Allergic reaction, Disp: , Rfl:    famotidine (PEPCID) 20 MG tablet, Take 20 mg by mouth as needed for heartburn or indigestion., Disp: , Rfl:    fexofenadine (ALLEGRA) 180 MG tablet, Take 180 mg by mouth daily as needed for allergies or rhinitis., Disp: , Rfl:    glucose blood (ONETOUCH ULTRA) test strip, Test twice daily, Disp: 100 each, Rfl: 0   JANUMET 50-1000 MG tablet, TAKE 1 TABLET BY MOUTH TWICE A DAY WITH MEALS, Disp: 180 tablet, Rfl: 1   Lancets (ONETOUCH ULTRASOFT) lancets,  Test twice daily, Disp: 100 each, Rfl: 0   levocetirizine (XYZAL) 5 MG tablet, TAKE 1 TABLET BY MOUTH EVERY DAY IN THE EVENING, Disp: 90 tablet, Rfl: 1   levothyroxine (SYNTHROID, LEVOTHROID) 112 MCG tablet, Take 224 mcg by mouth every morning. Take 2 tabs Monday - Saturday , take 1 tablet on Sunday, Disp: , Rfl:    lisinopril-hydrochlorothiazide (ZESTORETIC) 20-25 MG tablet, TAKE 1 TABLET BY MOUTH EVERY DAY, Disp: 90 tablet, Rfl: 1   Magnesium 500 MG CAPS, Take 500 mg by mouth daily., Disp: , Rfl:    pravastatin (PRAVACHOL) 80 MG tablet, TAKE ONE TAB MON-FRIDAYS, SKIP WEEKENDS, Disp: 75 tablet, Rfl: 2   promethazine (PHENERGAN) 25 MG tablet, TAKE 1 TABLET BY MOUTH EVERY 4-6 HOURS AS NEEDED, Disp: 30 tablet, Rfl: 1   Rimegepant Sulfate (NURTEC) 75 MG TBDP, Take 75 mg by mouth daily as needed., Disp: 10 tablet, Rfl: 2   TIADYLT ER 240 MG 24 hr capsule, TAKE 1 TABLET BY MOUTH AT BEDTIME FOR BP, Disp: 90 capsule, Rfl: 1   traZODone (DESYREL) 100 MG tablet, TAKE 1 TABLET BY MOUTH EVERYDAY AT BEDTIME, Disp: 90 tablet, Rfl: 1   TRINTELLIX 20 MG TABS tablet, TAKE 1 TABLET BY MOUTH EVERY DAY, Disp: 90 tablet, Rfl: 1   vitamin C (ASCORBIC ACID) 500 MG tablet, Take 500 mg by mouth daily., Disp: , Rfl:    Allergies  Allergen Reactions   Crab [Shellfish Allergy] Hives and Swelling    "Only when I eat too many crab legs" Can tolerate CT scans with contrast-does not need premeds       Review of Systems  Constitutional: Negative.   Respiratory: Negative.    Cardiovascular: Negative.  Negative for chest pain and palpitations.  Neurological: Negative.  Negative for dizziness.  Psychiatric/Behavioral:  The patient is nervous/anxious.     Today's Vitals   05/03/21 1538  BP: 132/78  Pulse: 86  Temp: 98.7 F (37.1 C)  Weight: 220 lb 3.2 oz (99.9 kg)  Height: _0  (1.651 m)   Body mass index is 36.64 kg/m.  Wt Readings from Last 3 Encounters:  05/03/21 220 lb 3.2 oz (99.9 kg)  12/19/20 219 lb 3.2  oz (99.4 kg)  09/06/20 212 lb 3.2 oz (96.3 kg)    BP Readings from Last 3 Encounters:  05/03/21 132/78  12/19/20 136/84  09/06/20 132/78    Objective:  Physical Exam      Assessment And Plan:     1. Type 2 diabetes  mellitus with stage 2 chronic kidney disease, without long-term current use of insulin ([place]) Comments: HgbA1c increased at last visit will recheck today Continue current medications - Hemoglobin A1c  2. Hypertensive nephropathy Comments: Blood pressure is well controlled. Continue current medications - BMP8+eGFR  3. Anxiety Comments: She has been going through more stressors requiring more frequent use of valium. Advised if taking more frequently throughout the week may need to make adjustments.  PDMP reviewed during this encounter. - diazepam (VALIUM) 5 MG tablet; One tab po qd prn  Dispense: 30 tablet; Refill: 3     Patient was given opportunity to ask questions. Patient verbalized understanding of the plan and was able to repeat key elements of the plan. All questions were answered to their satisfaction.  Minette Brine, FNP   I, Minette Brine, FNP, have reviewed all documentation for this visit. The documentation on 05/03/21 for the exam, diagnosis, procedures, and orders are all accurate and complete.   IF YOU HAVE BEEN REFERRED TO A SPECIALIST, IT MAY TAKE 1-2 WEEKS TO SCHEDULE/PROCESS THE REFERRAL. IF YOU HAVE NOT HEARD FROM US/SPECIALIST IN TWO WEEKS, PLEASE GIVE [place] A CALL AT [phone].   THE PATIENT IS ENCOURAGED TO PRACTICE SOCIAL DISTANCING DUE TO THE COVID-19 PANDEMIC.

## 2021-05-03 NOTE — Patient Instructions (Signed)
Generalized Anxiety Disorder, Adult Generalized anxiety disorder (GAD) is a mental health condition. Unlike normal worries, anxiety related to GAD is not triggered by a specific event. These worries do not fade or get better with time. GAD interferes with relationships, work, and school. GAD symptoms can vary from mild to severe. People with severe GAD can have intense waves of anxiety with physical symptoms that are similar to panic attacks. What are the causes? The exact cause of GAD is not known, but the following are believed to have an impact: Differences in natural brain chemicals. Genes passed down from parents to children. Differences in the way threats are perceived. Development during childhood. Personality. What increases the risk? The following factors may make you more likely to develop this condition: Being female. Having a family history of anxiety disorders. Being very shy. Experiencing very stressful life events, such as the death of a loved one. Having a very stressful family environment. What are the signs or symptoms? People with GAD often worry excessively about many things in their lives, such as their health and family. Symptoms may also include: Mental and emotional symptoms: Worrying excessively about natural disasters. Fear of being late. Difficulty concentrating. Fears that others are judging your performance. Physical symptoms: Fatigue. Headaches, muscle tension, muscle twitches, trembling, or feeling shaky. Feeling like your heart is pounding or beating very fast. Feeling out of breath or like you cannot take a deep breath. Having trouble falling asleep or staying asleep, or experiencing restlessness. Sweating. Nausea, diarrhea, or irritable bowel syndrome (IBS). Behavioral symptoms: Experiencing erratic moods or irritability. Avoidance of new situations. Avoidance of people. Extreme difficulty making decisions. How is this diagnosed? This condition  is diagnosed based on your symptoms and medical history. You will also have a physical exam. Your health care provider may perform tests to rule out other possible causes of your symptoms. To be diagnosed with GAD, a person must have anxiety that: Is out of his or her control. Affects several different aspects of his or her life, such as work and relationships. Causes distress that makes him or her unable to take part in normal activities. Includes at least three symptoms of GAD, such as restlessness, fatigue, trouble concentrating, irritability, muscle tension, or sleep problems. Before your health care provider can confirm a diagnosis of GAD, these symptoms must be present more days than they are not, and they must last for 6 months or longer. How is this treated? This condition may be treated with: Medicine. Antidepressant medicine is usually prescribed for long-term daily control. Anti-anxiety medicines may be added in severe cases, especially when panic attacks occur. Talk therapy (psychotherapy). Certain types of talk therapy can be helpful in treating GAD by providing support, education, and guidance. Options include: Cognitive behavioral therapy (CBT). People learn coping skills and self-calming techniques to ease their physical symptoms. They learn to identify unrealistic thoughts and behaviors and to replace them with more appropriate thoughts and behaviors. Acceptance and commitment therapy (ACT). This treatment teaches people how to be mindful as a way to cope with unwanted thoughts and feelings. Biofeedback. This process trains you to manage your body's response (physiological response) through breathing techniques and relaxation methods. You will work with a therapist while machines are used to monitor your physical symptoms. Stress management techniques. These include yoga, meditation, and exercise. A mental health specialist can help determine which treatment is best for you. Some  people see improvement with one type of therapy. However, other people require a combination   of therapies. Follow these instructions at home: Lifestyle Maintain a consistent routine and schedule. Anticipate stressful situations. Create a plan, and allow extra time to work with your plan. Practice stress management or self-calming techniques that you have learned from your therapist or your health care provider. General instructions Take over-the-counter and prescription medicines only as told by your health care provider. Understand that you are likely to have setbacks. Accept this and be kind to yourself as you persist to take better care of yourself. Recognize and accept your accomplishments, even if you judge them as small. Keep all follow-up visits as told by your health care provider. This is important. Contact a health care provider if: Your symptoms do not get better. Your symptoms get worse. You have signs of depression, such as: A persistently sad or irritable mood. Loss of enjoyment in activities that used to bring you joy. Change in weight or eating. Changes in sleeping habits. Avoiding friends or family members. Loss of energy for normal tasks. Feelings of guilt or worthlessness. Get help right away if: You have serious thoughts about hurting yourself or others. If you ever feel like you may hurt yourself or others, or have thoughts about taking your own life, get help right away. Go to your nearest emergency department or: Call your local emergency services (911 in the [place] Call a suicide crisis helpline, such as the National Suicide Prevention Lifeline at 1-800-273-8255. This is open 24 hours a day in the [place] Text the Crisis Text Line at 741741 (in the [place] Summary Generalized anxiety disorder (GAD) is a mental health condition that involves worry that is not triggered by a specific event. People with GAD often worry excessively about many things in their lives, such  as their health and family. GAD may cause symptoms such as restlessness, trouble concentrating, sleep problems, frequent sweating, nausea, diarrhea, headaches, and trembling or muscle twitching. A mental health specialist can help determine which treatment is best for you. Some people see improvement with one type of therapy. However, other people require a combination of therapies. This information is not intended to replace advice given to you by your health care provider. Make sure you discuss any questions you have with your health care provider. Document Revised: 04/22/2019 Document Reviewed: 04/22/2019 Elsevier Patient Education  2022 [employer].  

## 2021-05-03 NOTE — Telephone Encounter (Signed)
The pt was scheduled an appt for a f/u on diazepam, pt is in need of a refill.

## 2021-05-04 LAB — BMP8+EGFR
BUN/Creatinine Ratio: 19 (ref 12–28)
BUN: 22 mg/dL (ref 8–27)
CO2: 27 mmol/L (ref 20–29)
Calcium: 9.5 mg/dL (ref 8.7–10.3)
Chloride: 98 mmol/L (ref 96–106)
Creatinine, Ser: 1.17 mg/dL — ABNORMAL HIGH (ref 0.57–1.00)
Glucose: 133 mg/dL — ABNORMAL HIGH (ref 70–99)
Potassium: 4.6 mmol/L (ref 3.5–5.2)
Sodium: 140 mmol/L (ref 134–144)
eGFR: 53 mL/min/{1.73_m2} — ABNORMAL LOW (ref >59)

## 2021-05-04 LAB — HEMOGLOBIN A1C
Est. average glucose Bld gHb Est-mCnc: 177 mg/dL
Hgb A1c MFr Bld: 7.8 % — ABNORMAL HIGH (ref 4.8–5.6)

## 2021-05-14 ENCOUNTER — Other Ambulatory Visit: Payer: Self-pay | Admitting: Internal Medicine

## 2021-05-24 ENCOUNTER — Other Ambulatory Visit: Payer: Self-pay | Admitting: Internal Medicine

## 2021-07-25 ENCOUNTER — Other Ambulatory Visit: Payer: Self-pay | Admitting: Internal Medicine

## 2021-08-14 ENCOUNTER — Telehealth: Payer: Self-pay

## 2021-08-14 ENCOUNTER — Encounter: Payer: Self-pay | Admitting: Internal Medicine

## 2021-08-14 ENCOUNTER — Other Ambulatory Visit: Payer: Self-pay

## 2021-08-14 ENCOUNTER — Ambulatory Visit: Payer: BC Managed Care – PPO | Admitting: Internal Medicine

## 2021-08-14 VITALS — BP 124/80 | HR 84 | Temp 98.4°F | Ht 65.0 in | Wt 216.6 lb

## 2021-08-14 DIAGNOSIS — N182 Chronic kidney disease, stage 2 (mild): Secondary | ICD-10-CM

## 2021-08-14 DIAGNOSIS — E7800 Pure hypercholesterolemia, unspecified: Secondary | ICD-10-CM | POA: Diagnosis not present

## 2021-08-14 DIAGNOSIS — Z6836 Body mass index (BMI) 36.0-36.9, adult: Secondary | ICD-10-CM

## 2021-08-14 DIAGNOSIS — Z23 Encounter for immunization: Secondary | ICD-10-CM

## 2021-08-14 DIAGNOSIS — I129 Hypertensive chronic kidney disease with stage 1 through stage 4 chronic kidney disease, or unspecified chronic kidney disease: Secondary | ICD-10-CM | POA: Diagnosis not present

## 2021-08-14 DIAGNOSIS — E1122 Type 2 diabetes mellitus with diabetic chronic kidney disease: Secondary | ICD-10-CM | POA: Diagnosis not present

## 2021-08-14 DIAGNOSIS — Z85850 Personal history of malignant neoplasm of thyroid: Secondary | ICD-10-CM

## 2021-08-14 MED ORDER — DEXCOM G6 SENSOR MISC: 3 refills | Status: DC

## 2021-08-14 NOTE — Telephone Encounter (Signed)
Prior auth done for Dexcom G6, waiting on a response form the [employer].

## 2021-08-14 NOTE — Patient Instructions (Signed)
Mediterranean Diet °A Mediterranean diet refers to food and lifestyle choices that are based on the traditions of countries located on the [place]. It focuses on eating more fruits, vegetables, whole grains, beans, nuts, seeds, and heart-healthy fats, and eating less dairy, meat, eggs, and processed foods with added sugar, salt, and fat. This way of eating has been shown to help prevent certain conditions and improve outcomes for people who have chronic diseases, like kidney disease and heart disease. °What are tips for following this plan? °Reading food labels °Check the serving size of packaged foods. For foods such as rice and pasta, the serving size refers to the amount of cooked product, not dry. °Check the total fat in packaged foods. Avoid foods that have saturated fat or trans fats. °Check the ingredient list for added sugars, such as corn syrup. °Shopping ° °Buy a variety of foods that offer a balanced diet, including: °Fresh fruits and vegetables (produce). °Grains, beans, nuts, and seeds. Some of these may be available in unpackaged forms or large amounts (in bulk). °Fresh seafood. °Poultry and eggs. °Low-fat dairy products. °Buy whole ingredients instead of prepackaged foods. °Buy fresh fruits and vegetables in-season from local farmers markets. °Buy plain frozen fruits and vegetables. °If you do not have access to quality fresh seafood, buy precooked frozen shrimp or canned fish, such as tuna, salmon, or sardines. °Stock your pantry so you always have certain foods on hand, such as olive oil, canned tuna, canned tomatoes, rice, pasta, and beans. °Cooking °Cook foods with extra-virgin olive oil instead of using butter or other vegetable oils. °Have meat as a side dish, and have vegetables or grains as your main dish. This means having meat in small portions or adding small amounts of meat to foods like pasta or stew. °Use beans or vegetables instead of meat in common dishes like chili or  lasagna. °Experiment with different cooking methods. Try roasting, broiling, steaming, and sautéing vegetables. °Add frozen vegetables to soups, stews, pasta, or rice. °Add nuts or seeds for added healthy fats and plant protein at each meal. You can add these to yogurt, salads, or vegetable dishes. °Marinate fish or vegetables using olive oil, lemon juice, garlic, and fresh herbs. °Meal planning °Plan to eat one vegetarian meal one day each week. Try to work up to two vegetarian meals, if possible. °Eat seafood two or more times a week. °Have healthy snacks readily available, such as: °Vegetable sticks with hummus. °Greek yogurt. °Fruit and nut trail mix. °Eat balanced meals throughout the week. This includes: °Fruit: 2-3 servings a day. °Vegetables: 4-5 servings a day. °Low-fat dairy: 2 servings a day. °Fish, poultry, or lean meat: 1 serving a day. °Beans and legumes: 2 or more servings a week. °Nuts and seeds: 1-2 servings a day. °Whole grains: 6-8 servings a day. °Extra-virgin olive oil: 3-4 servings a day. °Limit red meat and sweets to only a few servings a month. °Lifestyle ° °Cook and eat meals together with your family, when possible. °Drink enough fluid to keep your urine pale yellow. °Be physically active every day. This includes: °Aerobic exercise like running or swimming. °Leisure activities like gardening, walking, or housework. °Get 7-8 hours of sleep each night. °If recommended by your health care provider, drink red wine in moderation. This means 1 glass a day for nonpregnant women and 2 glasses a day for men. A glass of wine equals 5 oz (150 mL). °What foods should I eat? °Fruits °Apples. Apricots. Avocado. Berries. Bananas. Cherries. Dates.   Figs. Grapes. Lemons. Melon. Oranges. Peaches. Plums. Pomegranate. °Vegetables °Artichokes. Beets. Broccoli. Cabbage. Carrots. Eggplant. Green beans. Chard. Kale. Spinach. Onions. Leeks. Peas. Squash. Tomatoes. Peppers. Radishes. °Grains °Whole-grain pasta. Brown  rice. Bulgur wheat. Polenta. Couscous. Whole-wheat bread. Oatmeal. Quinoa. °Meats and other proteins °Beans. Almonds. Sunflower seeds. Pine nuts. Peanuts. Cod. Salmon. Scallops. Shrimp. Tuna. Tilapia. Clams. Oysters. Eggs. Poultry without skin. °Dairy °Low-fat milk. Cheese. Greek yogurt. °Fats and oils °Extra-virgin olive oil. Avocado oil. Grapeseed oil. °Beverages °Water. Red wine. Herbal tea. °Sweets and desserts °Greek yogurt with honey. Baked apples. Poached pears. Trail mix. °Seasonings and condiments °Basil. Cilantro. Coriander. Cumin. Mint. Parsley. Sage. Rosemary. Tarragon. Garlic. Oregano. Thyme. Pepper. Balsamic vinegar. Tahini. Hummus. Tomato sauce. Olives. Mushrooms. °The items listed above may not be a complete list of foods and beverages you can eat. Contact a dietitian for more information. °What foods should I limit? °This is a list of foods that should be eaten rarely or only on special occasions. °Fruits °Fruit canned in syrup. °Vegetables °Deep-fried potatoes (french fries). °Grains °Prepackaged pasta or rice dishes. Prepackaged cereal with added sugar. Prepackaged snacks with added sugar. °Meats and other proteins °Beef. Pork. Lamb. Poultry with skin. Hot dogs. Bacon. °Dairy °Ice cream. Sour cream. Whole milk. °Fats and oils °Butter. Canola oil. Vegetable oil. Beef fat (tallow). Lard. °Beverages °Juice. Sugar-sweetened soft drinks. Beer. Liquor and spirits. °Sweets and desserts °Cookies. Cakes. Pies. Candy. °Seasonings and condiments °Mayonnaise. Pre-made sauces and marinades. °The items listed above may not be a complete list of foods and beverages you should limit. Contact a dietitian for more information. °Summary °The Mediterranean diet includes both food and lifestyle choices. °Eat a variety of fresh fruits and vegetables, beans, nuts, seeds, and whole grains. °Limit the amount of red meat and sweets that you eat. °If recommended by your health care provider, drink red wine in moderation.  This means 1 glass a day for nonpregnant women and 2 glasses a day for men. A glass of wine equals 5 oz (150 mL). °This information is not intended to replace advice given to you by your health care provider. Make sure you discuss any questions you have with your health care provider. °Document Revised: 08/07/2019 Document Reviewed: 06/04/2019 °Elsevier Patient Education © 2022 [employer] ° °

## 2021-08-14 NOTE — Progress Notes (Signed)
I,Brittany Rubio,acting as a Education administrator for Brittany Greenland, MD.,have documented all relevant documentation on the behalf of Brittany Greenland, MD,as directed by  Brittany Greenland, MD while in the presence of Brittany Greenland, MD.  This visit occurred during the SARS-CoV-2 public health emergency.  Safety protocols were in place, including screening questions prior to the visit, additional usage of staff PPE, and extensive cleaning of exam room while observing appropriate contact time as indicated for disinfecting solutions.  Subjective:     Patient ID: Brittany Rubio , female    DOB: April 16, 1961 , 61 y.o.   MRN: [id number]   Chief Complaint  Patient presents with   Diabetes   Hypertension   Hyperlipidemia    HPI  She presents today for diabetes, blood pressure and cholesterol check.  She reports compliance with meds. She denies headaches, chest pain and shortness of breath. 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 Unfortunately, school is being built so she is in transition at the moment.   Diabetes She presents for her follow-up diabetic visit. She has type 2 diabetes mellitus. Her disease course has been stable. Pertinent negatives for hypoglycemia include no dizziness. Pertinent negatives for diabetes include no blurred vision, no chest pain, no fatigue, no polydipsia, no polyphagia and no polyuria. There are no hypoglycemic complications. Diabetic complications include nephropathy. Risk factors for coronary artery disease include diabetes mellitus, dyslipidemia, hypertension, obesity, sedentary lifestyle, post-menopausal and stress. Her weight is fluctuating minimally. She is following a diabetic diet.  Hypertension This is a chronic problem. The current episode started more than 1 year ago. The problem has been gradually improving since onset. The problem is uncontrolled. Pertinent negatives include no blurred vision, chest pain, palpitations or  shortness of breath.    Past Medical History:  Diagnosis Date   ADD (attention deficit disorder)    Anemia    Anxiety    Arthritis    "left shoulder" (07/07/2014)   Back pain    Breast cancer ([place])    "left"   Depression    Fatty liver    Food allergy    shellfish   GERD (gastroesophageal reflux disease)    "takes over the counters as needed"   Hernia, umbilical    High cholesterol    Hypertension 11/26/2011   sees Dr. Bryon Lions   Hypothyroidism    Incisional hernia without obstruction or gangrene 06/05/2018   Joint pain    Migraines    "maybe once q 3 months" (07/07/2014)   Multinodular thyroid 2015   OSA on CPAP    Personal history of radiation therapy    Pneumonia    May 2018   Sepsis due to Streptococcus pneumoniae (HCC)    Septic shock (HCC)    Severe sepsis ([place]) 06/05/2019   Thyroid cancer ([place])    2015   Type II diabetes mellitus ([place])    Vitamin D deficiency      Family History  Problem Relation Age of Onset   Cancer Mother        Pancreatic   Diabetes Mother    Hypertension Mother    Depression Mother    Hypertension Father    Alcoholism Father      Current Outpatient Medications:    albuterol (VENTOLIN HFA) 108 (90 Base) MCG/ACT inhaler, Inhale 2 puffs into the lungs every 6 (six) hours as needed for wheezing or shortness of breath., Disp: 8 g,  Rfl: 0   amphetamine-dextroamphetamine (ADDERALL XR) 20 MG 24 hr capsule, Take 20 mg by mouth daily. 1 per day with the 10 mg and 30 mg xr, Disp: , Rfl:    amphetamine-dextroamphetamine (ADDERALL XR) 30 MG 24 hr capsule, Take 30 mg by mouth daily. Take 1 capsule with the 10 mg and the 20 mg xr, Disp: , Rfl:    amphetamine-dextroamphetamine (ADDERALL) 10 MG tablet, Take 10 mg by mouth daily with breakfast. Take 1 per day with the 20 mg xr and the 30 mg xr, Disp: , Rfl:    benzonatate (TESSALON PERLES) 100 MG capsule, Take 1 capsule (100 mg total) by mouth 3 (three) times daily as needed., Disp: 20 capsule,  Rfl: 0   cholecalciferol (VITAMIN D3) 25 MCG (1000 UT) tablet, Take 1,000 Units by mouth daily., Disp: , Rfl:    Continuous Blood Gluc Sensor (DEXCOM G6 SENSOR) MISC, Use as directed to check blood sugars 3 times per day dx e.11.22, Disp: 3 each, Rfl: 3   Cyanocobalamin (VITAMIN B-12 PO), Take 1 tablet by mouth once a week. Tuesdays, Disp: , Rfl:    dapagliflozin propanediol (FARXIGA) 10 MG TABS tablet, Take 1 tablet (10 mg total) by mouth daily., Disp: 90 tablet, Rfl: 2   diazepam (VALIUM) 5 MG tablet, One tab po qd prn, Disp: 30 tablet, Rfl: 3   EPINEPHrine 0.3 mg/0.3 mL IJ SOAJ injection, 0.3 mLs by Subdermal route once as needed. Allergic reaction, Disp: , Rfl:    famotidine (PEPCID) 20 MG tablet, Take 20 mg by mouth as needed for heartburn or indigestion., Disp: , Rfl:    fexofenadine (ALLEGRA) 180 MG tablet, Take 180 mg by mouth daily as needed for allergies or rhinitis., Disp: , Rfl:    glucose blood (ONETOUCH ULTRA) test strip, Test twice daily, Disp: 100 each, Rfl: 0   JANUMET 50-1000 MG tablet, TAKE 1 TABLET BY MOUTH TWICE A DAY WITH MEALS, Disp: 180 tablet, Rfl: 1   levocetirizine (XYZAL) 5 MG tablet, TAKE 1 TABLET BY MOUTH EVERY DAY IN THE EVENING, Disp: 90 tablet, Rfl: 1   levothyroxine (SYNTHROID, LEVOTHROID) 112 MCG tablet, Take 224 mcg by mouth every morning. Take 2 tabs Monday - Saturday , take 1 tablet on Sunday, Disp: , Rfl:    lisinopril-hydrochlorothiazide (ZESTORETIC) 20-25 MG tablet, TAKE 1 TABLET BY MOUTH EVERY DAY, Disp: 90 tablet, Rfl: 1   Magnesium 500 MG CAPS, Take 500 mg by mouth daily., Disp: , Rfl:    pravastatin (PRAVACHOL) 80 MG tablet, TAKE ONE TAB MON-FRIDAYS, SKIP WEEKENDS, Disp: 75 tablet, Rfl: 2   promethazine (PHENERGAN) 25 MG tablet, TAKE 1 TABLET BY MOUTH EVERY 4-6 HOURS AS NEEDED, Disp: 30 tablet, Rfl: 1   Rimegepant Sulfate (NURTEC) 75 MG TBDP, Take 75 mg by mouth daily as needed., Disp: 10 tablet, Rfl: 2   TIADYLT ER 240 MG 24 hr capsule, TAKE 1 TABLET BY  MOUTH AT BEDTIME FOR BP, Disp: 90 capsule, Rfl: 1   traZODone (DESYREL) 100 MG tablet, TAKE 1 TABLET BY MOUTH EVERYDAY AT BEDTIME, Disp: 90 tablet, Rfl: 1   TRINTELLIX 20 MG TABS tablet, TAKE 1 TABLET BY MOUTH EVERY DAY, Disp: 90 tablet, Rfl: 1   vitamin C (ASCORBIC ACID) 500 MG tablet, Take 500 mg by mouth daily., Disp: , Rfl:    Allergies  Allergen Reactions   Crab [Shellfish Allergy] Hives and Swelling    "Only when I eat too many crab legs" Can tolerate CT scans with contrast-does  not need premeds       Review of Systems  Constitutional: Negative.  Negative for fatigue.  Eyes:  Negative for blurred vision.  Respiratory: Negative.  Negative for shortness of breath.   Cardiovascular: Negative.  Negative for chest pain and palpitations.  Gastrointestinal: Negative.   Endocrine: Negative for polydipsia, polyphagia and polyuria.  Neurological:  Negative for dizziness.  Psychiatric/Behavioral: Negative.      Today's Vitals   08/14/21 0842  BP: 124/80  Pulse: 84  Temp: 98.4 F (36.9 C)  Weight: 216 lb 9.6 oz (98.2 kg)  Height: _0  (1.651 m)   Body mass index is 36.04 kg/m.  Wt Readings from Last 3 Encounters:  08/14/21 216 lb 9.6 oz (98.2 kg)  05/03/21 220 lb 3.2 oz (99.9 kg)  12/19/20 219 lb 3.2 oz (99.4 kg)    BP Readings from Last 3 Encounters:  08/14/21 124/80  05/03/21 132/78  12/19/20 136/84    Objective:  Physical Exam Vitals and nursing note reviewed.  Constitutional:      Appearance: Normal appearance. She is obese.  HENT:     Head: Normocephalic and atraumatic.     Nose:     Comments: Masked     Mouth/Throat:     Comments: Masked  Eyes:     Extraocular Movements: Extraocular movements intact.  Cardiovascular:     Rate and Rhythm: Normal rate and regular rhythm.     Heart sounds: Normal heart sounds.  Pulmonary:     Effort: Pulmonary effort is normal.     Breath sounds: Normal breath sounds.  Musculoskeletal:     Cervical back: Normal range of  motion.  Skin:    General: Skin is warm.  Neurological:     General: No focal deficit present.     Mental Status: She is alert.  Psychiatric:        Mood and Affect: Mood normal.        Behavior: Behavior normal.     Assessment And Plan:     1. Type 2 diabetes mellitus with stage 2 chronic kidney disease, without long-term current use of insulin (HCC) Comments: Chronic, I will check labs as below and adjust as needed. Importance of medication/dietary compliance was d/w pt. She will rto in 4 months for re-evaluation. - CMP14+EGFR - Hemoglobin A1c - Lipid panel - TSH + free T4 - TgAb+Thyroglobulin IMA or LCMS  2. Hypertensive nephropathy Comments: Chronic, well controlled. I will check renal function today.   3. Pure hypercholesterolemia Comments: Chronic, she will c/w pravastatin 22m daily. She is encouraged to avoid fried foods, aim for at least 150 min of exercise/wk and increase fiber intake.   4. Class 2 severe obesity due to excess calories with serious comorbidity and body mass index (BMI) of 36.0 to 36.9 in adult ([facility] Comments: She is encouraged to aim for BMI less than 30 to decrease cardiac risk. Advised to aim for at least 150 minutes of exercise per week.   5. History of thyroid cancer Comments: She is followed by Dr. BChalmers Cater now on supplemental thyroid meds due to postsurgical hypothyroidism. I will check labs and forward to Dr. BChalmers Caterfor her review.  - TSH + free T4 - TgAb+Thyroglobulin IMA or LCMS  6. Need for vaccination Comments: She was given Shingrix IM x 1.   Patient was given opportunity to ask questions. Patient verbalized understanding of the plan and was able to repeat key elements of the plan. All questions were answered to  their satisfaction.   I, Brittany Greenland, MD, have reviewed all documentation for this visit. The documentation on 08/14/21 for the exam, diagnosis, procedures, and orders are all accurate and complete.   IF YOU HAVE BEEN REFERRED  TO A SPECIALIST, IT MAY TAKE 1-2 WEEKS TO SCHEDULE/PROCESS THE REFERRAL. IF YOU HAVE NOT HEARD FROM US/SPECIALIST IN TWO WEEKS, PLEASE GIVE [place] A CALL AT [phone] X [phone].   THE PATIENT IS ENCOURAGED TO PRACTICE SOCIAL DISTANCING DUE TO THE COVID-19 PANDEMIC.

## 2021-08-15 ENCOUNTER — Telehealth: Payer: Self-pay

## 2021-08-15 LAB — CMP14+EGFR
ALT: 26 IU/L (ref 0–32)
AST: 23 IU/L (ref 0–40)
Albumin/Globulin Ratio: 1.3 (ref 1.2–2.2)
Albumin: 4.3 g/dL (ref 3.8–4.8)
Alkaline Phosphatase: 54 IU/L (ref 44–121)
BUN/Creatinine Ratio: 18 (ref 12–28)
BUN: 18 mg/dL (ref 8–27)
Bilirubin Total: <0.2 mg/dL (ref 0.0–1.2)
CO2: 26 mmol/L (ref 20–29)
Calcium: 9.9 mg/dL (ref 8.7–10.3)
Chloride: 99 mmol/L (ref 96–106)
Creatinine, Ser: 0.99 mg/dL (ref 0.57–1.00)
Globulin, Total: 3.2 g/dL (ref 1.5–4.5)
Glucose: 83 mg/dL (ref 70–99)
Potassium: 4.8 mmol/L (ref 3.5–5.2)
Sodium: 139 mmol/L (ref 134–144)
Total Protein: 7.5 g/dL (ref 6.0–8.5)
eGFR: 65 mL/min/{1.73_m2} (ref >59)

## 2021-08-15 LAB — LIPID PANEL
Chol/HDL Ratio: 3.3 ratio (ref 0.0–4.4)
Cholesterol, Total: 160 mg/dL (ref 100–199)
HDL: 48 mg/dL (ref >39)
LDL Chol Calc (NIH): 91 mg/dL (ref 0–99)
Triglycerides: 114 mg/dL (ref 0–149)
VLDL Cholesterol Cal: 21 mg/dL (ref 5–40)

## 2021-08-15 LAB — HEMOGLOBIN A1C
Est. average glucose Bld gHb Est-mCnc: 177 mg/dL
Hgb A1c MFr Bld: 7.8 % — ABNORMAL HIGH (ref 4.8–5.6)

## 2021-08-15 LAB — TSH+FREE T4
Free T4: 2.52 ng/dL — ABNORMAL HIGH (ref 0.82–1.77)
TSH: 0.056 u[IU]/mL — ABNORMAL LOW (ref 0.450–4.500)

## 2021-08-15 LAB — TGAB+THYROGLOBULIN IMA OR LCMS: Thyroglobulin Antibody: <1 IU/mL (ref 0.0–0.9)

## 2021-08-15 LAB — THYROGLOBULIN BY IMA: Thyroglobulin by IMA: <0.1 ng/mL — ABNORMAL LOW (ref 1.5–38.5)

## 2021-08-15 NOTE — Telephone Encounter (Signed)
Patient notified that her prior auth for her dexcom sensor has been approved through 08/14/22. YL,RMA

## 2021-08-16 ENCOUNTER — Other Ambulatory Visit: Payer: Self-pay

## 2021-08-16 ENCOUNTER — Other Ambulatory Visit: Payer: Self-pay | Admitting: Internal Medicine

## 2021-08-16 MED ORDER — PRAVASTATIN SODIUM 80 MG PO TABS: ORAL_TABLET | ORAL | 2 refills | Status: DC

## 2021-08-22 ENCOUNTER — Other Ambulatory Visit: Payer: Self-pay | Admitting: Internal Medicine

## 2021-08-22 DIAGNOSIS — C50919 Malignant neoplasm of unspecified site of unspecified female breast: Secondary | ICD-10-CM

## 2021-09-28 ENCOUNTER — Other Ambulatory Visit: Payer: Self-pay | Admitting: Internal Medicine

## 2021-10-03 ENCOUNTER — Ambulatory Visit (INDEPENDENT_AMBULATORY_CARE_PROVIDER_SITE_OTHER): Payer: BC Managed Care – PPO

## 2021-10-03 ENCOUNTER — Other Ambulatory Visit: Payer: Self-pay

## 2021-10-03 VITALS — BP 136/84 | HR 88 | Temp 97.7°F | Ht 65.0 in | Wt 216.0 lb

## 2021-10-03 DIAGNOSIS — Z23 Encounter for immunization: Secondary | ICD-10-CM

## 2021-10-03 NOTE — Progress Notes (Signed)
Pt presents today for flu vaccine.  

## 2021-11-02 ENCOUNTER — Other Ambulatory Visit: Payer: Self-pay | Admitting: Nurse Practitioner

## 2021-11-02 DIAGNOSIS — F419 Anxiety disorder, unspecified: Secondary | ICD-10-CM

## 2021-11-12 ENCOUNTER — Other Ambulatory Visit: Payer: Self-pay | Admitting: Internal Medicine

## 2021-11-13 ENCOUNTER — Ambulatory Visit: Payer: BC Managed Care – PPO | Admitting: Internal Medicine

## 2021-11-13 ENCOUNTER — Encounter: Payer: Self-pay | Admitting: Internal Medicine

## 2021-11-13 VITALS — BP 130/80 | HR 85 | Temp 98.1°F | Ht 65.0 in | Wt 216.4 lb

## 2021-11-13 DIAGNOSIS — E66812 Obesity, class 2: Secondary | ICD-10-CM

## 2021-11-13 DIAGNOSIS — N182 Chronic kidney disease, stage 2 (mild): Secondary | ICD-10-CM

## 2021-11-13 DIAGNOSIS — R110 Nausea: Secondary | ICD-10-CM

## 2021-11-13 DIAGNOSIS — Z780 Asymptomatic menopausal state: Secondary | ICD-10-CM

## 2021-11-13 DIAGNOSIS — E1122 Type 2 diabetes mellitus with diabetic chronic kidney disease: Secondary | ICD-10-CM | POA: Diagnosis not present

## 2021-11-13 DIAGNOSIS — I129 Hypertensive chronic kidney disease with stage 1 through stage 4 chronic kidney disease, or unspecified chronic kidney disease: Secondary | ICD-10-CM | POA: Diagnosis not present

## 2021-11-13 DIAGNOSIS — F5101 Primary insomnia: Secondary | ICD-10-CM

## 2021-11-13 DIAGNOSIS — Z6836 Body mass index (BMI) 36.0-36.9, adult: Secondary | ICD-10-CM

## 2021-11-13 DIAGNOSIS — Z23 Encounter for immunization: Secondary | ICD-10-CM

## 2021-11-13 MED ORDER — PROMETHAZINE HCL 25 MG PO TABS: ORAL_TABLET | ORAL | 0 refills | Status: DC

## 2021-11-13 MED ORDER — DEXCOM G6 RECEIVER DEVI: 3 refills | Status: DC

## 2021-11-13 MED ORDER — EPINEPHRINE 0.3 MG/0.3ML IJ SOAJ: 0.3000 mg | Freq: Once | INTRAMUSCULAR | 3 refills | Status: AC | PRN

## 2021-11-13 MED ORDER — DEXCOM G6 TRANSMITTER MISC: 3 refills | Status: DC

## 2021-11-13 MED ORDER — LEVOTHYROXINE SODIUM 175 MCG/ML PO SOLN: 175.0000 ug | Freq: Every day | ORAL | 5 refills | Status: AC

## 2021-11-13 NOTE — Patient Instructions (Signed)

## 2021-11-13 NOTE — Progress Notes (Signed)
?I,Brittany Rubio,acting as a scribe for Brittany Greenland, MD.,have documented all relevant documentation on the behalf of Brittany Greenland, MD,as directed by  Brittany Greenland, MD while in the presence of Brittany Greenland, MD.  ?This visit occurred during the SARS-CoV-2 public health emergency.  Safety protocols were in place, including screening questions prior to the visit, additional usage of staff PPE, and extensive cleaning of exam room while observing appropriate contact time as indicated for disinfecting solutions. ? ?Subjective:  ?  ? Patient ID: Brittany Rubio , female    DOB: [birth year] , 61 y.o.   MRN: [id number] ? ? ?Chief Complaint  ?Patient presents with  ? Diabetes  ? Hypertension  ? ? ?HPI ? ?Pt presents today DM/HTN check. She reports compliance with meds. Denies headaches, chest pain and shortness of breath.  ? ?She would also like a for refill on promethazine & to start a new sleep medication, previously taken off of trazodone.  ?She reports her sleep patterns are irregular.  ? ? ?Diabetes ?She presents for her follow-up diabetic visit. She has type 2 diabetes mellitus. Her disease course has been stable. Pertinent negatives for hypoglycemia include no dizziness. Pertinent negatives for diabetes include no blurred vision, no chest pain, no fatigue, no polydipsia, no polyphagia and no polyuria. There are no hypoglycemic complications. Diabetic complications include nephropathy. Risk factors for coronary artery disease include diabetes mellitus, dyslipidemia, hypertension, obesity, sedentary lifestyle, post-menopausal and stress. Her weight is fluctuating minimally. She is following a diabetic diet.  ?Hypertension ?This is a chronic problem. The current episode started more than 1 year ago. The problem has been gradually improving since onset. The problem is uncontrolled. Pertinent negatives include no blurred vision, chest pain, palpitations or shortness of breath.   ? ?Past Medical History:   ?Diagnosis Date  ? ADD (attention deficit disorder)   ? Anemia   ? Anxiety   ? Arthritis   ? "left shoulder" (07/07/2014)  ? Back pain   ? Breast cancer ([place])   ? "left"  ? Depression   ? Fatty liver   ? Food allergy   ? shellfish  ? GERD (gastroesophageal reflux disease)   ? "takes over the counters as needed"  ? Hernia, umbilical   ? High cholesterol   ? Hypertension 11/26/2011  ? sees Dr. Bryon Lions  ? Hypothyroidism   ? Incisional hernia without obstruction or gangrene 06/05/2018  ? Joint pain   ? Migraines   ? "maybe once q 3 months" (07/07/2014)  ? Multinodular thyroid 2015  ? OSA on CPAP   ? Personal history of radiation therapy   ? Pneumonia   ? May 2018  ? Sepsis due to Streptococcus pneumoniae ([place])   ? Septic shock ([place])   ? Severe sepsis ([place]) 06/05/2019  ? Thyroid cancer ([place])   ? 2015  ? Type II diabetes mellitus ([place])   ? Vitamin D deficiency   ?  ? ?Family History  ?Problem Relation Age of Onset  ? Cancer Mother   ?     Pancreatic  ? Diabetes Mother   ? Hypertension Mother   ? Depression Mother   ? Hypertension Father   ? Alcoholism Father   ? ? ? ?Current Outpatient Medications:  ?  albuterol (VENTOLIN HFA) 108 (90 Base) MCG/ACT inhaler, Inhale 2 puffs into the lungs every 6 (six) hours as needed for wheezing or shortness of breath., Disp: 8 g, Rfl: 0 ?  amphetamine-dextroamphetamine (ADDERALL XR)  20 MG 24 hr capsule, Take 20 mg by mouth daily. 1 per day with the 10 mg and 30 mg xr, Disp: , Rfl:  ?  amphetamine-dextroamphetamine (ADDERALL XR) 30 MG 24 hr capsule, Take 30 mg by mouth daily. Take 1 capsule with the 10 mg and the 20 mg xr, Disp: , Rfl:  ?  amphetamine-dextroamphetamine (ADDERALL) 10 MG tablet, Take 10 mg by mouth daily with breakfast. Take 1 per day with the 20 mg xr and the 30 mg xr, Disp: , Rfl:  ?  benzonatate (TESSALON PERLES) 100 MG capsule, Take 1 capsule (100 mg total) by mouth 3 (three) times daily as needed., Disp: 20 capsule, Rfl: 0 ?  cholecalciferol (VITAMIN D3) 25 MCG  (1000 UT) tablet, Take 1,000 Units by mouth daily., Disp: , Rfl:  ?  Continuous Blood Gluc Receiver ([place]) DEVI, Use as directed to check blood sugars 3 times per day dx e.11.22, Disp: 3 each, Rfl: 3 ?  Continuous Blood Gluc Sensor (DEXCOM G6 SENSOR) MISC, Use as directed to check blood sugars 3 times per day dx e.11.22, Disp: 3 each, Rfl: 3 ?  Continuous Blood Gluc Transmit (DEXCOM G6 TRANSMITTER) MISC, Use as directed to check blood sugars 3 times per day dx e.11.22, Disp: 3 each, Rfl: 3 ?  Cyanocobalamin (VITAMIN B-12 PO), Take 1 tablet by mouth once a week. Tuesdays, Disp: , Rfl:  ?  dapagliflozin propanediol (FARXIGA) 10 MG TABS tablet, Take 1 tablet (10 mg total) by mouth daily., Disp: 90 tablet, Rfl: 2 ?  diazepam (VALIUM) 5 MG tablet, TAKE 1 TABLET BY MOUTH EVERY DAY AS NEEDED, Disp: 20 tablet, Rfl: 0 ?  famotidine (PEPCID) 20 MG tablet, Take 20 mg by mouth as needed for heartburn or indigestion., Disp: , Rfl:  ?  fexofenadine (ALLEGRA) 180 MG tablet, Take 180 mg by mouth daily as needed for allergies or rhinitis., Disp: , Rfl:  ?  levocetirizine (XYZAL) 5 MG tablet, TAKE 1 TABLET BY MOUTH EVERY DAY IN THE EVENING, Disp: 90 tablet, Rfl: 1 ?  lisinopril-hydrochlorothiazide (ZESTORETIC) 20-25 MG tablet, TAKE 1 TABLET BY MOUTH EVERY DAY, Disp: 90 tablet, Rfl: 1 ?  Magnesium 500 MG CAPS, Take 500 mg by mouth daily., Disp: , Rfl:  ?  pravastatin (PRAVACHOL) 80 MG tablet, TAKE ONE TAB MON-SUNDAY, Disp: 75 tablet, Rfl: 2 ?  Rimegepant Sulfate (NURTEC) 75 MG TBDP, Take 75 mg by mouth daily as needed., Disp: 10 tablet, Rfl: 2 ?  TIADYLT ER 240 MG 24 hr capsule, TAKE 1 TABLET BY MOUTH AT BEDTIME FOR BLOOD PRESSURE, Disp: 90 capsule, Rfl: 1 ?  TRINTELLIX 20 MG TABS tablet, TAKE 1 TABLET BY MOUTH EVERY DAY, Disp: 90 tablet, Rfl: 1 ?  vitamin C (ASCORBIC ACID) 500 MG tablet, Take 500 mg by mouth daily., Disp: , Rfl:  ?  EPINEPHrine 0.3 mg/0.3 mL IJ SOAJ injection, Inject 0.3 mg into the muscle once as  needed. Allergic reaction, Disp: 1 each, Rfl: 3 ?  glucose blood (ONETOUCH ULTRA) test strip, Test twice daily, Disp: 100 each, Rfl: 0 ?  JANUMET 50-1000 MG tablet, TAKE 1 TABLET BY MOUTH TWICE A DAY WITH MEALS, Disp: 180 tablet, Rfl: 1 ?  levothyroxine (SYNTHROID, LEVOTHROID) 112 MCG tablet, Take 224 mcg by mouth every morning. Take 2 tabs Monday - Saturday , take 1 tablet on Sunday, Disp: , Rfl:  ?  promethazine (PHENERGAN) 25 MG tablet, TAKE 1 TABLET BY MOUTH EVERY 4-6 HOURS AS NEEDED, Disp: 30  tablet, Rfl: 0 ?  traZODone (DESYREL) 100 MG tablet, TAKE 1 TABLET BY MOUTH EVERYDAY AT BEDTIME, Disp: 90 tablet, Rfl: 1  ? ?Allergies  ?Allergen Reactions  ? Crab [Shellfish Allergy] Hives and Swelling  ?  "Only when I eat too many crab legs" ?Can tolerate CT scans with contrast-does not need premeds  ?  ?  ? ?Review of Systems  ?Constitutional: Negative.  Negative for fatigue.  ?Eyes:  Negative for blurred vision.  ?Respiratory: Negative.  Negative for shortness of breath.   ?Cardiovascular: Negative.  Negative for chest pain and palpitations.  ?Gastrointestinal:  Positive for nausea.  ?     No vomiting, abdominal pain  ?Endocrine: Negative for polydipsia, polyphagia and polyuria.  ?Neurological: Negative.  Negative for dizziness.  ?Psychiatric/Behavioral:  Positive for sleep disturbance.    ? ?Today's Vitals  ? 11/13/21 1006  ?BP: 130/80  ?Pulse: 85  ?Temp: 98.1 ?F (36.7 ?C)  ?Weight: 216 lb 6.4 oz (98.2 kg)  ?Height: '5\' 5"'$  (1.651 m)  ?PainSc: 0-No pain  ? ?Body mass index is 36.01 kg/m?.  ?Wt Readings from Last 3 Encounters:  ?11/13/21 216 lb 6.4 oz (98.2 kg)  ?10/03/21 216 lb (98 kg)  ?08/14/21 216 lb 9.6 oz (98.2 kg)  ?  ?Objective:  ?Physical Exam ?Vitals and nursing note reviewed.  ?Constitutional:   ?   Appearance: Normal appearance.  ?HENT:  ?   Head: Normocephalic and atraumatic.  ?Eyes:  ?   Extraocular Movements: Extraocular movements intact.  ?Cardiovascular:  ?   Rate and Rhythm: Normal rate and regular  rhythm.  ?   Heart sounds: Normal heart sounds.  ?Pulmonary:  ?   Effort: Pulmonary effort is normal.  ?   Breath sounds: Normal breath sounds.  ?Musculoskeletal:  ?   Cervical back: Normal range of motion.  ?Ski

## 2021-11-14 LAB — CMP14+EGFR
ALT: 24 IU/L (ref 0–32)
AST: 22 IU/L (ref 0–40)
Albumin/Globulin Ratio: 1.3 (ref 1.2–2.2)
Albumin: 4.2 g/dL (ref 3.8–4.8)
Alkaline Phosphatase: 57 IU/L (ref 44–121)
BUN/Creatinine Ratio: 15 (ref 12–28)
BUN: 15 mg/dL (ref 8–27)
Bilirubin Total: <0.2 mg/dL (ref 0.0–1.2)
CO2: 26 mmol/L (ref 20–29)
Calcium: 10 mg/dL (ref 8.7–10.3)
Chloride: 101 mmol/L (ref 96–106)
Creatinine, Ser: 0.97 mg/dL (ref 0.57–1.00)
Globulin, Total: 3.2 g/dL (ref 1.5–4.5)
Glucose: 145 mg/dL — ABNORMAL HIGH (ref 70–99)
Potassium: 5.1 mmol/L (ref 3.5–5.2)
Sodium: 143 mmol/L (ref 134–144)
Total Protein: 7.4 g/dL (ref 6.0–8.5)
eGFR: 66 mL/min/{1.73_m2} (ref >59)

## 2021-11-14 LAB — HEMOGLOBIN A1C
Est. average glucose Bld gHb Est-mCnc: 192 mg/dL
Hgb A1c MFr Bld: 8.3 % — ABNORMAL HIGH (ref 4.8–5.6)

## 2021-12-13 ENCOUNTER — Ambulatory Visit: Payer: BC Managed Care – PPO | Admitting: Internal Medicine

## 2022-01-24 ENCOUNTER — Other Ambulatory Visit: Payer: Self-pay | Admitting: Internal Medicine

## 2022-02-14 ENCOUNTER — Other Ambulatory Visit: Payer: Self-pay | Admitting: Internal Medicine

## 2022-02-14 DIAGNOSIS — C50919 Malignant neoplasm of unspecified site of unspecified female breast: Secondary | ICD-10-CM

## 2022-02-19 ENCOUNTER — Encounter: Payer: Self-pay | Admitting: Internal Medicine

## 2022-02-19 ENCOUNTER — Ambulatory Visit (INDEPENDENT_AMBULATORY_CARE_PROVIDER_SITE_OTHER): Payer: BC Managed Care – PPO | Admitting: Internal Medicine

## 2022-02-19 ENCOUNTER — Other Ambulatory Visit: Payer: Self-pay | Admitting: Internal Medicine

## 2022-02-19 VITALS — BP 144/98 | Temp 98.3°F | Ht 65.0 in | Wt 221.2 lb

## 2022-02-19 DIAGNOSIS — I129 Hypertensive chronic kidney disease with stage 1 through stage 4 chronic kidney disease, or unspecified chronic kidney disease: Secondary | ICD-10-CM | POA: Diagnosis not present

## 2022-02-19 DIAGNOSIS — E1122 Type 2 diabetes mellitus with diabetic chronic kidney disease: Secondary | ICD-10-CM | POA: Diagnosis not present

## 2022-02-19 DIAGNOSIS — E7800 Pure hypercholesterolemia, unspecified: Secondary | ICD-10-CM

## 2022-02-19 DIAGNOSIS — Z0000 Encounter for general adult medical examination without abnormal findings: Secondary | ICD-10-CM

## 2022-02-19 DIAGNOSIS — N182 Chronic kidney disease, stage 2 (mild): Secondary | ICD-10-CM | POA: Diagnosis not present

## 2022-02-19 DIAGNOSIS — Z1231 Encounter for screening mammogram for malignant neoplasm of breast: Secondary | ICD-10-CM

## 2022-02-19 DIAGNOSIS — Z6836 Body mass index (BMI) 36.0-36.9, adult: Secondary | ICD-10-CM

## 2022-02-19 DIAGNOSIS — I739 Peripheral vascular disease, unspecified: Secondary | ICD-10-CM

## 2022-02-19 DIAGNOSIS — F320 Major depressive disorder, single episode, mild: Secondary | ICD-10-CM

## 2022-02-19 LAB — POCT URINALYSIS DIPSTICK
Bilirubin, UA: NEGATIVE
Blood, UA: NEGATIVE
Glucose, UA: NEGATIVE
Leukocytes, UA: NEGATIVE
Nitrite, UA: NEGATIVE
Protein, UA: POSITIVE — AB
Spec Grav, UA: >=1.03 — AB (ref 1.010–1.025)
Urobilinogen, UA: 0.2 E.U./dL
pH, UA: 6 (ref 5.0–8.0)

## 2022-02-19 NOTE — Progress Notes (Signed)
Rich Brave Llittleton,acting as a Education administrator for [name], MD.,have documented all relevant documentation on the behalf of [name], MD,as directed by  [name], MD while in the presence of [name], MD.   Subjective:     Patient ID: Brittany Rubio , female    DOB: [birth year] , 61 y.o.   MRN: [id number]   Chief Complaint  Patient presents with   Annual Exam   Diabetes   Hypertension    HPI  She is here today for a full physical examination.   She is followed by Earnstine Regal, PA for her GYN exams. She reports compliance with meds. Her MIL has now moved in with her. She admits this has caused additional stress.   Diabetes She presents for her follow-up diabetic visit. She has type 2 diabetes mellitus. Her disease course has been stable. Headaches: she has h/o migraines. needs refill of Stadol NS. Pertinent negatives for diabetes include no blurred vision. There are no hypoglycemic complications. Diabetic complications include nephropathy. Risk factors for coronary artery disease include diabetes mellitus, dyslipidemia, hypertension, obesity, sedentary lifestyle, post-menopausal and stress. Her weight is fluctuating minimally. She is following a diabetic diet. She participates in exercise intermittently. Her home blood glucose trend is increasing steadily. Her breakfast blood glucose is taken between 8-9 am. Her breakfast blood glucose range is generally 140-180 mg/dl. An ACE inhibitor/angiotensin II receptor blocker is being taken. Eye exam is current.  Hypertension This is a chronic problem. The current episode started more than 1 year ago. The problem has been gradually improving since onset. The problem is uncontrolled. Pertinent negatives include no blurred vision. Headaches: she has h/o migraines. needs refill of Stadol NS.The current treatment provides moderate improvement. Compliance problems include exercise.  Hypertensive end-organ damage includes kidney  disease.     Past Medical History:  Diagnosis Date   ADD (attention deficit disorder)    Anemia    Anxiety    Arthritis    "left shoulder" (07/07/2014)   Back pain    Breast cancer ([place])    "left"   Depression    Fatty liver    Food allergy    shellfish   GERD (gastroesophageal reflux disease)    "takes over the counters as needed"   Hernia, umbilical    High cholesterol    Hypertension 11/26/2011   sees Dr. Bryon Lions   Hypothyroidism    Incisional hernia without obstruction or gangrene 06/05/2018   Joint pain    Migraines    "maybe once q 3 months" (07/07/2014)   Multinodular thyroid 2015   OSA on CPAP    Personal history of radiation therapy    Pneumonia    May 2018   Sepsis due to Streptococcus pneumoniae ([facility])    Septic shock ([facility])    Severe sepsis ([place]) 06/05/2019   Thyroid cancer ([place])    2015   Type II diabetes mellitus ([place])    Vitamin D deficiency      Family History  Problem Relation Age of Onset   Cancer Mother        Pancreatic   Diabetes Mother    Hypertension Mother    Depression Mother    Hypertension Father    Alcoholism Father      Current Outpatient Medications:    amphetamine-dextroamphetamine (ADDERALL XR) 20 MG 24 hr capsule, Take 20 mg by mouth daily. 1 per day with the 10 mg and 30 mg xr, Disp: , Rfl:  amphetamine-dextroamphetamine (ADDERALL XR) 30 MG 24 hr capsule, Take 30 mg by mouth daily. Take 1 capsule with the 10 mg and the 20 mg xr, Disp: , Rfl:    amphetamine-dextroamphetamine (ADDERALL) 10 MG tablet, Take 10 mg by mouth daily with breakfast. Take 1 per day with the 20 mg xr and the 30 mg xr, Disp: , Rfl:    benzonatate (TESSALON PERLES) 100 MG capsule, Take 1 capsule (100 mg total) by mouth 3 (three) times daily as needed., Disp: 20 capsule, Rfl: 0   cholecalciferol (VITAMIN D3) 25 MCG (1000 UT) tablet, Take 1,000 Units by mouth daily., Disp: , Rfl:    Continuous Blood Gluc Receiver ([place]) DEVI, Use as  directed to check blood sugars 3 times per day dx e.11.22, Disp: 3 each, Rfl: 3   Continuous Blood Gluc Sensor (DEXCOM G6 SENSOR) MISC, Use as directed to check blood sugars 3 times per day dx e.11.22, Disp: 3 each, Rfl: 3   Continuous Blood Gluc Transmit (DEXCOM G6 TRANSMITTER) MISC, Use as directed to check blood sugars 3 times per day dx e.11.22, Disp: 3 each, Rfl: 3   Cyanocobalamin (VITAMIN B-12 PO), Take 1 tablet by mouth once a week. Tuesdays, Disp: , Rfl:    dapagliflozin propanediol (FARXIGA) 10 MG TABS tablet, Take 1 tablet (10 mg total) by mouth daily., Disp: 90 tablet, Rfl: 2   diazepam (VALIUM) 5 MG tablet, TAKE 1 TABLET BY MOUTH EVERY DAY AS NEEDED, Disp: 20 tablet, Rfl: 0   EPINEPHrine 0.3 mg/0.3 mL IJ SOAJ injection, Inject 0.3 mg into the muscle once as needed. Allergic reaction, Disp: 1 each, Rfl: 3   famotidine (PEPCID) 20 MG tablet, Take 20 mg by mouth as needed for heartburn or indigestion., Disp: , Rfl:    JANUMET 50-1000 MG tablet, TAKE 1 TABLET BY MOUTH TWICE A DAY WITH MEALS, Disp: 180 tablet, Rfl: 1   Levothyroxine Sodium 175 MCG/ML SOLN, Take 175 mcg by mouth daily., Disp: 30 mL, Rfl: 5   lisinopril-hydrochlorothiazide (ZESTORETIC) 20-25 MG tablet, TAKE 1 TABLET BY MOUTH EVERY DAY, Disp: 90 tablet, Rfl: 1   Magnesium 500 MG CAPS, Take 500 mg by mouth daily., Disp: , Rfl:    pravastatin (PRAVACHOL) 80 MG tablet, TAKE ONE TAB MON-SUNDAY, Disp: 75 tablet, Rfl: 2   promethazine (PHENERGAN) 25 MG tablet, TAKE 1 TABLET BY MOUTH EVERY 4-6 HOURS AS NEEDED, Disp: 30 tablet, Rfl: 0   Rimegepant Sulfate (NURTEC) 75 MG TBDP, Take 75 mg by mouth daily as needed., Disp: 10 tablet, Rfl: 2   TIADYLT ER 240 MG 24 hr capsule, TAKE 1 TABLET BY MOUTH AT BEDTIME FOR BLOOD PRESSURE, Disp: 90 capsule, Rfl: 1   TRINTELLIX 20 MG TABS tablet, TAKE 1 TABLET BY MOUTH EVERY DAY, Disp: 90 tablet, Rfl: 1   vitamin C (ASCORBIC ACID) 500 MG tablet, Take 500 mg by mouth daily., Disp: , Rfl:     fexofenadine (ALLEGRA) 180 MG tablet, Take 180 mg by mouth daily as needed for allergies or rhinitis. (Patient not taking: Reported on 02/19/2022), Disp: , Rfl:    levocetirizine (XYZAL) 5 MG tablet, TAKE 1 TABLET BY MOUTH EVERY DAY IN THE EVENING (Patient not taking: Reported on 02/19/2022), Disp: 90 tablet, Rfl: 1   Allergies  Allergen Reactions   Crab [Shellfish Allergy] Hives and Swelling    "Only when I eat too many crab legs" Can tolerate CT scans with contrast-does not need premeds        The  patient states she uses post menopausal status for birth control. Last LMP was No LMP recorded. Patient has had a hysterectomy.. Negative for Dysmenorrhea. Negative for: breast discharge, breast lump(s), breast pain and breast self exam. Associated symptoms include abnormal vaginal bleeding. Pertinent negatives include abnormal bleeding (hematology), anxiety, decreased libido, depression, difficulty falling sleep, dyspareunia, history of infertility, nocturia, sexual dysfunction, sleep disturbances, urinary incontinence, urinary urgency, vaginal discharge and vaginal itching. Diet regular.The patient states her exercise level is  intermittent.  . The patient's tobacco use is:  Social History   Tobacco Use  Smoking Status Never  Smokeless Tobacco Never  . She has been exposed to passive smoke. The patient's alcohol use is:  Social History   Substance and Sexual Activity  Alcohol Use Yes   Comment: 07/07/2014 "a few times/year; weddings, anniversary, etc"   Review of Systems  Constitutional: Negative.   HENT: Negative.    Eyes: Negative.  Negative for blurred vision.  Respiratory: Negative.    Cardiovascular: Negative.   Gastrointestinal: Negative.   Endocrine: Negative.   Genitourinary: Negative.   Musculoskeletal: Negative.   Skin: Negative.   Allergic/Immunologic: Negative.   Neurological: Negative.  Headaches: she has h/o migraines. needs refill of Stadol NS.  Hematological: Negative.    Psychiatric/Behavioral: Negative.       Today's Vitals   02/19/22 1054 02/19/22 1143  BP: (!) 146/90 (!) 144/98  Temp: 98.3 F (36.8 C)   Weight: 221 lb 3.2 oz (100.3 kg)   Height: _0  (1.651 m)   PainSc: 0-No pain    Body mass index is 36.81 kg/m.  Wt Readings from Last 3 Encounters:  02/19/22 221 lb 3.2 oz (100.3 kg)  11/13/21 216 lb 6.4 oz (98.2 kg)  10/03/21 216 lb (98 kg)     Objective:  Physical Exam Vitals and nursing note reviewed.  Constitutional:      Appearance: Normal appearance. She is obese.  HENT:     Head: Normocephalic and atraumatic.     Right Ear: Tympanic membrane, ear canal and external ear normal.     Left Ear: Tympanic membrane, ear canal and external ear normal.     Nose: Nose normal.     Mouth/Throat:     Mouth: Mucous membranes are moist.     Pharynx: Oropharynx is clear.  Eyes:     Extraocular Movements: Extraocular movements intact.     Conjunctiva/sclera: Conjunctivae normal.     Pupils: Pupils are equal, round, and reactive to light.  Cardiovascular:     Rate and Rhythm: Normal rate and regular rhythm.     Pulses: Normal pulses.          Dorsalis pedis pulses are 2+ on the right side and 2+ on the left side.     Heart sounds: Normal heart sounds.  Pulmonary:     Effort: Pulmonary effort is normal.     Breath sounds: Normal breath sounds.  Chest:  Breasts:    Tanner Score is 5.     Comments: Surgical scars b/l breasts Abdominal:     General: Bowel sounds are normal.     Palpations: Abdomen is soft.     Comments: Midline healed surgical scar, obese, soft. Difficult to assess organomegaly.  Genitourinary:    Comments: deferred Musculoskeletal:        General: Normal range of motion.     Cervical back: Normal range of motion and neck supple.  Feet:     Right foot:  Protective Sensation: 5 sites tested.  5 sites sensed.     Skin integrity: Dry skin present.     Toenail Condition: Right toenails are normal.     Left foot:      Protective Sensation: 5 sites tested.  5 sites sensed.     Skin integrity: Dry skin present.     Toenail Condition: Left toenails are normal.  Skin:    General: Skin is warm and dry.  Neurological:     General: No focal deficit present.     Mental Status: She is alert and oriented to person, place, and time.  Psychiatric:        Mood and Affect: Mood normal.        Behavior: Behavior normal.       Assessment And Plan:     1. Encounter for general adult medical examination w/o abnormal findings Comments: A full exam was performed. Importance of monthly self breast exams was discussed with the patient. PATIENT IS ADVISED TO GET 30-45 MINUTES REGULAR EXERCISE NO LESS THAN FOUR TO FIVE DAYS PER WEEK - BOTH WEIGHTBEARING EXERCISES AND AEROBIC ARE RECOMMENDED.  PATIENT IS ADVISED TO FOLLOW A HEALTHY DIET WITH AT LEAST SIX FRUITS/VEGGIES PER DAY, DECREASE INTAKE OF RED MEAT, AND TO INCREASE FISH INTAKE TO TWO DAYS PER WEEK.  MEATS/FISH SHOULD NOT BE FRIED, BAKED OR BROILED IS PREFERABLE.  IT IS ALSO IMPORTANT TO CUT BACK ON YOUR SUGAR INTAKE. PLEASE AVOID ANYTHING WITH ADDED SUGAR, CORN SYRUP OR OTHER SWEETENERS. IF YOU MUST USE A SWEETENER, YOU CAN TRY STEVIA. IT IS ALSO IMPORTANT TO AVOID ARTIFICIALLY SWEETENERS AND DIET BEVERAGES. LASTLY, I SUGGEST WEARING SPF 50 SUNSCREEN ON EXPOSED PARTS AND ESPECIALLY WHEN IN THE DIRECT SUNLIGHT FOR AN EXTENDED PERIOD OF TIME.  PLEASE AVOID FAST FOOD RESTAURANTS AND INCREASE YOUR WATER INTAKE. - CBC - Lipid panel - Hemoglobin A1c - Urine microalbumin-creatinine with uACR - BMP8+eGFR  2. Type 2 diabetes mellitus with stage 2 chronic kidney disease, without long-term current use of insulin ([facility]) Comments: Diabetic foot exam was performed. She will rto in 3-4 months.  We discussed stopping Janumet and starting Ozempic. She denies having personal/family h/o MTC.  She does have personal h/o papillary thyroid cancer. I will discuss further with her Endo, Dr.  Chalmers Cater. Pt advised only h/o MTC is absolute contraindication. Possible side effects d/w pt, encouraged to stop eating when full. Additionally, I plan to switch her from [place] to Xigduo 11/998 to ensure she stays on metformin. All questions were answered to her satisfaction. I DISCUSSED WITH THE PATIENT AT LENGTH REGARDING THE GOALS OF GLYCEMIC CONTROL AND POSSIBLE LONG-TERM COMPLICATIONS.  I  ALSO STRESSED THE IMPORTANCE OF COMPLIANCE WITH HOME GLUCOSE MONITORING, DIETARY RESTRICTIONS INCLUDING AVOIDANCE OF SUGARY DRINKS/PROCESSED FOODS,  ALONG WITH REGULAR EXERCISE.  I  ALSO STRESSED THE IMPORTANCE OF ANNUAL EYE EXAMS, SELF FOOT CARE AND COMPLIANCE WITH OFFICE VISITS.   3. Hypertensive nephropathy Comments: Chronic, fair control. EKG performed, NSR w/ nonspecific T abnormality, consider old anterior infarct. Importance of medication/dietary compliance was d/w pt. She is encouraged to follow a low sodium diet. Will re-evaluate in 3 months. If elevated, I will make med adjustments as indicated.  - POCT Urinalysis Dipstick (81002) - EKG 12-Lead  4. Pure hypercholesterolemia Comments: Chronic, she is currently on pravastatin. I would like to switch her to higher intensity statin, will wait for her to run out of current supply. LDL goal <70.  5. Major depressive disorder, single episode, mild (  [place]) Comments: Chronic, she reports sx are stable on Trintellix 56m daily. She would like to c/w current meds.   6. Claudication ([facility] Comments: She is encouraged to incorporate more exercise into her daily routine, aiming for at least 150 minutes /week.   7. Class 2 severe obesity due to excess calories with serious comorbidity and body mass index (BMI) of 36.0 to 36.9 in adult ([facility] Comments: She has gained 5 lbs since her last visit May 2023. She is encouraged to consider caloric content of alcohol and sugary drinks, while decreasing intake.    Patient was given opportunity to ask questions. Patient  verbalized understanding of the plan and was able to repeat key elements of the plan. All questions were answered to their satisfaction.   I, RMaximino Greenland MD, have reviewed all documentation for this visit. The documentation on 02/19/22 for the exam, diagnosis, procedures, and orders are all accurate and complete.   THE PATIENT IS ENCOURAGED TO PRACTICE SOCIAL DISTANCING DUE TO THE COVID-19 PANDEMIC.

## 2022-02-19 NOTE — Patient Instructions (Addendum)
STOP JANUMET, STOP FARXIGA  START XIGDUO 11/998 TWICE DAILY, TAKE WITH BREAKFAST AND DINNER  START OZEMPIC, CALL IF YOU HAVE ANY QUESTIONS  Health Maintenance, Female Adopting a healthy lifestyle and getting preventive care are important in promoting health and wellness. Ask your health care provider about: The right schedule for you to have regular tests and exams. Things you can do on your own to prevent diseases and keep yourself healthy. What should I know about diet, weight, and exercise? Eat a healthy diet  Eat a diet that includes plenty of vegetables, fruits, low-fat dairy products, and lean protein. Do not eat a lot of foods that are high in solid fats, added sugars, or sodium. Maintain a healthy weight Body mass index (BMI) is used to identify weight problems. It estimates body fat based on height and weight. Your health care provider can help determine your BMI and help you achieve or maintain a healthy weight. Get regular exercise Get regular exercise. This is one of the most important things you can do for your health. Most adults should: Exercise for at least 150 minutes each week. The exercise should increase your heart rate and make you sweat (moderate-intensity exercise). Do strengthening exercises at least twice a week. This is in addition to the moderate-intensity exercise. Spend less time sitting. Even light physical activity can be beneficial. Watch cholesterol and blood lipids Have your blood tested for lipids and cholesterol at 62 years of age, then have this test every 5 years. Have your cholesterol levels checked more often if: Your lipid or cholesterol levels are high. You are older than 62 years of age. You are at high risk for heart disease. What should I know about cancer screening? Depending on your health history and family history, you may need to have cancer screening at various ages. This may include screening for: Breast cancer. Cervical  cancer. Colorectal cancer. Skin cancer. Lung cancer. What should I know about heart disease, diabetes, and high blood pressure? Blood pressure and heart disease High blood pressure causes heart disease and increases the risk of stroke. This is more likely to develop in people who have high blood pressure readings or are overweight. Have your blood pressure checked: Every 3-5 years if you are 51-83 years of age. Every year if you are 70 years old or older. Diabetes Have regular diabetes screenings. This checks your fasting blood sugar level. Have the screening done: Once every three years after age 42 if you are at a normal weight and have a low risk for diabetes. More often and at a younger age if you are overweight or have a high risk for diabetes. What should I know about preventing infection? Hepatitis B If you have a higher risk for hepatitis B, you should be screened for this virus. Talk with your health care provider to find out if you are at risk for hepatitis B infection. Hepatitis C Testing is recommended for: Everyone born from [birth year] through 1966. Anyone with known risk factors for hepatitis C. Sexually transmitted infections (STIs) Get screened for STIs, including gonorrhea and chlamydia, if: You are sexually active and are younger than 62 years of age. You are older than 62 years of age and your health care provider tells you that you are at risk for this type of infection. Your sexual activity has changed since you were last screened, and you are at increased risk for chlamydia or gonorrhea. Ask your health care provider if you are at risk. Ask your  health care provider about whether you are at high risk for HIV. Your health care provider may recommend a prescription medicine to help prevent HIV infection. If you choose to take medicine to prevent HIV, you should first get tested for HIV. You should then be tested every 3 months for as long as you are taking the  medicine. Pregnancy If you are about to stop having your period (premenopausal) and you may become pregnant, seek counseling before you get pregnant. Take 400 to 800 micrograms (mcg) of folic acid every day if you become pregnant. Ask for birth control (contraception) if you want to prevent pregnancy. Osteoporosis and menopause Osteoporosis is a disease in which the bones lose minerals and strength with aging. This can result in bone fractures. If you are 54 years old or older, or if you are at risk for osteoporosis and fractures, ask your health care provider if you should: Be screened for bone loss. Take a calcium or vitamin D supplement to lower your risk of fractures. Be given hormone replacement therapy (HRT) to treat symptoms of menopause. Follow these instructions at home: Alcohol use Do not drink alcohol if: Your health care provider tells you not to drink. You are pregnant, may be pregnant, or are planning to become pregnant. If you drink alcohol: Limit how much you have to: 0-1 drink a day. Know how much alcohol is in your drink. In the [place] one drink equals one 12 oz bottle of beer (355 mL), one 5 oz glass of wine (148 mL), or one 1 oz glass of hard liquor (44 mL). Lifestyle Do not use any products that contain nicotine or tobacco. These products include cigarettes, chewing tobacco, and vaping devices, such as e-cigarettes. If you need help quitting, ask your health care provider. Do not use street drugs. Do not share needles. Ask your health care provider for help if you need support or information about quitting drugs. General instructions Schedule regular health, dental, and eye exams. Stay current with your vaccines. Tell your health care provider if: You often feel depressed. You have ever been abused or do not feel safe at home. Summary Adopting a healthy lifestyle and getting preventive care are important in promoting health and wellness. Follow your health care  provider's instructions about healthy diet, exercising, and getting tested or screened for diseases. Follow your health care provider's instructions on monitoring your cholesterol and blood pressure. This information is not intended to replace advice given to you by your health care provider. Make sure you discuss any questions you have with your health care provider. Document Revised: 11/21/2020 Document Reviewed: 11/21/2020 Elsevier Patient Education  [place].

## 2022-02-20 LAB — CBC
Hematocrit: 40.1 % (ref 34.0–46.6)
Hemoglobin: 12.3 g/dL (ref 11.1–15.9)
MCH: 22.2 pg — ABNORMAL LOW (ref 26.6–33.0)
MCHC: 30.7 g/dL — ABNORMAL LOW (ref 31.5–35.7)
MCV: 72 fL — ABNORMAL LOW (ref 79–97)
Platelets: 368 10*3/uL (ref 150–450)
RBC: 5.54 x10E6/uL — ABNORMAL HIGH (ref 3.77–5.28)
RDW: 16.9 % — ABNORMAL HIGH (ref 11.7–15.4)
WBC: 8.5 10*3/uL (ref 3.4–10.8)

## 2022-02-20 LAB — LIPID PANEL
Chol/HDL Ratio: 3.3 ratio (ref 0.0–4.4)
Cholesterol, Total: 167 mg/dL (ref 100–199)
HDL: 50 mg/dL (ref >39)
LDL Chol Calc (NIH): 87 mg/dL (ref 0–99)
Triglycerides: 174 mg/dL — ABNORMAL HIGH (ref 0–149)
VLDL Cholesterol Cal: 30 mg/dL (ref 5–40)

## 2022-02-20 LAB — MICROALBUMIN / CREATININE URINE RATIO
Creatinine, Urine: 188 mg/dL
Microalb/Creat Ratio: 1310 mg/g creat — ABNORMAL HIGH (ref 0–29)
Microalbumin, Urine: 2462.7 ug/mL

## 2022-02-20 LAB — BMP8+EGFR
BUN/Creatinine Ratio: 20 (ref 12–28)
BUN: 19 mg/dL (ref 8–27)
CO2: 26 mmol/L (ref 20–29)
Calcium: 9.4 mg/dL (ref 8.7–10.3)
Chloride: 96 mmol/L (ref 96–106)
Creatinine, Ser: 0.94 mg/dL (ref 0.57–1.00)
Glucose: 162 mg/dL — ABNORMAL HIGH (ref 70–99)
Potassium: 4.9 mmol/L (ref 3.5–5.2)
Sodium: 138 mmol/L (ref 134–144)
eGFR: 69 mL/min/{1.73_m2} (ref >59)

## 2022-02-20 LAB — HEMOGLOBIN A1C
Est. average glucose Bld gHb Est-mCnc: 243 mg/dL
Hgb A1c MFr Bld: 10.1 % — ABNORMAL HIGH (ref 4.8–5.6)

## 2022-02-21 ENCOUNTER — Encounter: Payer: Self-pay | Admitting: Internal Medicine

## 2022-03-13 ENCOUNTER — Other Ambulatory Visit: Payer: Self-pay

## 2022-03-13 ENCOUNTER — Other Ambulatory Visit: Payer: Self-pay | Admitting: Internal Medicine

## 2022-03-13 DIAGNOSIS — F419 Anxiety disorder, unspecified: Secondary | ICD-10-CM

## 2022-03-13 MED ORDER — XIGDUO XR 5-1000 MG PO TB24: 1.0000 | ORAL_TABLET | Freq: Every day | ORAL | 1 refills | Status: DC

## 2022-03-13 MED ORDER — DAPAGLIFLOZIN PROPANEDIOL 10 MG PO TABS: 10.0000 mg | ORAL_TABLET | Freq: Every day | ORAL | 2 refills | Status: DC

## 2022-03-13 MED ORDER — DIAZEPAM 5 MG PO TABS: ORAL_TABLET | ORAL | 0 refills | Status: DC

## 2022-03-26 ENCOUNTER — Telehealth: Payer: BC Managed Care – PPO | Admitting: Internal Medicine

## 2022-03-27 ENCOUNTER — Other Ambulatory Visit: Payer: Self-pay | Admitting: Internal Medicine

## 2022-03-28 ENCOUNTER — Ambulatory Visit
Admission: RE | Admit: 2022-03-28 | Discharge: 2022-03-28 | Disposition: A | Discharge status: Home / Self Care | Payer: BC Managed Care – PPO | Source: Ambulatory Visit | Attending: Internal Medicine | Admitting: Internal Medicine

## 2022-03-28 DIAGNOSIS — Z1231 Encounter for screening mammogram for malignant neoplasm of breast: Secondary | ICD-10-CM

## 2022-04-05 ENCOUNTER — Other Ambulatory Visit: Payer: Self-pay

## 2022-04-05 ENCOUNTER — Telehealth (INDEPENDENT_AMBULATORY_CARE_PROVIDER_SITE_OTHER): Payer: BC Managed Care – PPO | Admitting: Internal Medicine

## 2022-04-05 DIAGNOSIS — Z85850 Personal history of malignant neoplasm of thyroid: Secondary | ICD-10-CM | POA: Diagnosis not present

## 2022-04-05 DIAGNOSIS — E1122 Type 2 diabetes mellitus with diabetic chronic kidney disease: Secondary | ICD-10-CM | POA: Diagnosis not present

## 2022-04-05 DIAGNOSIS — N182 Chronic kidney disease, stage 2 (mild): Secondary | ICD-10-CM

## 2022-04-05 MED ORDER — SEMAGLUTIDE (1 MG/DOSE) 4 MG/3ML ~~LOC~~ SOPN: 1.0000 mg | PEN_INJECTOR | SUBCUTANEOUS | 1 refills | Status: DC

## 2022-04-05 NOTE — Progress Notes (Signed)
Virtual Visit via Video   This visit type was conducted due to national recommendations for restrictions regarding the COVID-19 Pandemic (e.g. social distancing) in an effort to limit this patient's exposure and mitigate transmission in our community.  Due to her co-morbid illnesses, this patient is at least at moderate risk for complications without adequate follow up.  This format is felt to be most appropriate for this patient at this time.  All issues noted in this document were discussed and addressed.  A limited physical exam was performed with this format.    This visit type was conducted due to national recommendations for restrictions regarding the COVID-19 Pandemic (e.g. social distancing) in an effort to limit this patient's exposure and mitigate transmission in our community.  Patients identity confirmed using two different identifiers.  This format is felt to be most appropriate for this patient at this time.  All issues noted in this document were discussed and addressed.  No physical exam was performed (except for noted visual exam findings with Video Visits).    Date:  04/21/2022   ID:  Brittany Rubio, DOB 1961/01/23, MRN [id number]  Patient Location:  In car, pulled over  Provider location:   Office  Chief Complaint:  "I have diabetes f/u"  History of Present Illness:    Brittany Rubio is a 61 y.o. female who presents via video conferencing for a telehealth visit today.    The patient does not have symptoms concerning for COVID-19 infection (fever, chills, cough, or new shortness of breath).   She presents today for virtual visit. She prefers this method of contact due to COVID-19 pandemic.  She presents today for diabetes f/u. She was started on Ozempic at her last visit, she is now on 0.'25mg'$  weekly. She reports she ran out and has not had a dose this week. She did not have any issues with the medication.        Past Medical History:  Diagnosis Date   ADD  (attention deficit disorder)    Anemia    Anxiety    Arthritis    "left shoulder" (07/07/2014)   Back pain    Breast cancer ([place])    "left"   Depression    Fatty liver    Food allergy    shellfish   GERD (gastroesophageal reflux disease)    "takes over the counters as needed"   Hernia, umbilical    High cholesterol    Hypertension 11/26/2011   sees Dr. Bryon Lions   Hypothyroidism    Incisional hernia without obstruction or gangrene 06/05/2018   Joint pain    Migraines    "maybe once q 3 months" (07/07/2014)   Multinodular thyroid 2015   OSA on CPAP    Personal history of radiation therapy    Pneumonia    May 2018   Sepsis due to Streptococcus pneumoniae ([place])    Septic shock (HCC)    Severe sepsis ([place]) 06/05/2019   Thyroid cancer ([place])    2015   Type II diabetes mellitus ([place])    Vitamin D deficiency    Past Surgical History:  Procedure Laterality Date   ABDOMINAL HYSTERECTOMY  ? 1997   BREAST EXCISIONAL BIOPSY Right 2014   BREAST LUMPECTOMY Left 2009   BREAST REDUCTION SURGERY Bilateral    DIAPHRAGM SURGERY  1986   "trauma"   INCISIONAL HERNIA REPAIR N/A 06/05/2018   Procedure: [place];  Surgeon: Erroll Luna, MD;  Location: [facility]  OR;  Service: General;  Laterality: N/A;   INSERTION OF MESH N/A 06/05/2018   Procedure: INSERTION OF MESH;  Surgeon: Erroll Luna, MD;  Location: [facility];  Service: General;  Laterality: N/A;   PARTIAL MASTECTOMY WITH NEEDLE LOCALIZATION  08/12/2012   Procedure: PARTIAL MASTECTOMY WITH NEEDLE LOCALIZATION;  Surgeon: Joyice Faster. Cornett, MD;  Location: [facility];  Service: General;  Laterality: Right;   REDUCTION MAMMAPLASTY Bilateral 1995   THYROIDECTOMY Left 07/07/2014   Procedure: LEFT HEMI-THYROIDECTOMY;  Surgeon: Ascencion Dike, MD;  Location: [facility];  Service: ENT;  Laterality: Left;   THYROIDECTOMY Right 07/08/2014   Procedure: Completion THYROIDECTOMY;  Surgeon: Ascencion Dike, MD;  Location: [facility];   Service: ENT;  Laterality: Right;   THYROIDECTOMY, PARTIAL Left 07/07/2014   hemi     Current Meds  Medication Sig   amphetamine-dextroamphetamine (ADDERALL XR) 20 MG 24 hr capsule Take 20 mg by mouth daily. 1 per day with the 10 mg and 30 mg xr   amphetamine-dextroamphetamine (ADDERALL XR) 30 MG 24 hr capsule Take 30 mg by mouth daily. Take 1 capsule with the 10 mg and the 20 mg xr   amphetamine-dextroamphetamine (ADDERALL) 10 MG tablet Take 10 mg by mouth daily with breakfast. Take 1 per day with the 20 mg xr and the 30 mg xr   benzonatate (TESSALON PERLES) 100 MG capsule Take 1 capsule (100 mg total) by mouth 3 (three) times daily as needed.   cholecalciferol (VITAMIN D3) 25 MCG (1000 UT) tablet Take 1,000 Units by mouth daily.   Continuous Blood Gluc Receiver (DEXCOM G6 RECEIVER) DEVI Use as directed to check blood sugars 3 times per day dx e.11.22   Continuous Blood Gluc Sensor (DEXCOM G6 SENSOR) MISC Use as directed to check blood sugars 3 times per day dx e.11.22   Continuous Blood Gluc Transmit (DEXCOM G6 TRANSMITTER) MISC Use as directed to check blood sugars 3 times per day dx e.11.22   Cyanocobalamin (VITAMIN B-12 PO) Take 1 tablet by mouth once a week. Tuesdays   dapagliflozin propanediol (FARXIGA) 10 MG TABS tablet Take 1 tablet (10 mg total) by mouth daily.   Dapagliflozin-metFORMIN HCl ER (XIGDUO XR) 11-998 MG TB24 Take 1 tablet by mouth daily.   diazepam (VALIUM) 5 MG tablet TAKE 1 TABLET BY MOUTH EVERY DAY AS NEEDED   EPINEPHrine 0.3 mg/0.3 mL IJ SOAJ injection Inject 0.3 mg into the muscle once as needed. Allergic reaction   famotidine (PEPCID) 20 MG tablet Take 20 mg by mouth as needed for heartburn or indigestion.   JANUMET 50-1000 MG tablet TAKE 1 TABLET BY MOUTH TWICE A DAY WITH MEALS   levocetirizine (XYZAL) 5 MG tablet TAKE 1 TABLET BY MOUTH EVERY DAY IN THE EVENING   Levothyroxine Sodium 175 MCG/ML SOLN Take 175 mcg by mouth daily.   lisinopril-hydrochlorothiazide  (ZESTORETIC) 20-25 MG tablet TAKE 1 TABLET BY MOUTH EVERY DAY   Magnesium 500 MG CAPS Take 500 mg by mouth daily.   pravastatin (PRAVACHOL) 80 MG tablet TAKE ONE TAB MON-SUNDAY   promethazine (PHENERGAN) 25 MG tablet TAKE 1 TABLET BY MOUTH EVERY 4-6 HOURS AS NEEDED   Rimegepant Sulfate (NURTEC) 75 MG TBDP Take 75 mg by mouth daily as needed.   TIADYLT ER 240 MG 24 hr capsule TAKE 1 TABLET BY MOUTH AT BEDTIME FOR BLOOD PRESSURE   TRINTELLIX 20 MG TABS tablet TAKE 1 TABLET BY MOUTH EVERY DAY   vitamin C (ASCORBIC ACID) 500 MG tablet Take 500  mg by mouth daily.     Allergies:   Crab [shellfish allergy]   Social History   Tobacco Use   Smoking status: Never   Smokeless tobacco: Never  Vaping Use   Vaping Use: Never used  Substance Use Topics   Alcohol use: Yes    Comment: 07/07/2014 "a few times/year; weddings, anniversary, etc"   Drug use: No     Family Hx: The patient's family history includes Alcoholism in her father; Cancer in her mother; Depression in her mother; Diabetes in her mother; Hypertension in her father and mother.  ROS:   Please see the history of present illness.    Review of Systems  Constitutional: Negative.   Respiratory: Negative.    Cardiovascular: Negative.   Gastrointestinal: Negative.   Neurological: Negative.   Psychiatric/Behavioral: Negative.      All other systems reviewed and are negative.   Labs/Other Tests and Data Reviewed:    Recent Labs: 08/14/2021: TSH 0.056 11/13/2021: ALT 24 02/19/2022: BUN 19; Creatinine, Ser 0.94; Hemoglobin 12.3; Platelets 368; Potassium 4.9; Sodium 138   Recent Lipid Panel Lab Results  Component Value Date/Time   CHOL 167 02/19/2022 02:59 PM   TRIG 174 (H) 02/19/2022 02:59 PM   HDL 50 02/19/2022 02:59 PM   CHOLHDL 3.3 02/19/2022 02:59 PM   LDLCALC 87 02/19/2022 02:59 PM    Wt Readings from Last 3 Encounters:  02/19/22 221 lb 3.2 oz (100.3 kg)  11/13/21 216 lb 6.4 oz (98.2 kg)  10/03/21 216 lb (98 kg)      Exam:    Vital Signs:  There were no vitals taken for this visit.    Physical Exam Vitals and nursing note reviewed.  HENT:     Head: Normocephalic and atraumatic.  Eyes:     Extraocular Movements: Extraocular movements intact.  Pulmonary:     Effort: Pulmonary effort is normal.  Musculoskeletal:     Cervical back: Normal range of motion.  Neurological:     Mental Status: She is alert and oriented to person, place, and time.  Psychiatric:        Mood and Affect: Affect normal.     ASSESSMENT & PLAN:    1. Type 2 diabetes mellitus with stage 2 chronic kidney disease, without long-term current use of insulin (HCC) Comments: She agrees to come to office to get sample of Ozempic, 0.'5mg'$  weekly x 4 weeks, she will then titrated to '1mg'$  weekly. F/u 2 months. The importance of medication and dietary compliance was stressed to the patient. All questions were answered to her satisfaction.   2. History of thyroid cancer Comments: She has h/o papillary thyroid cancer, she understands h/o MTC is contraindication to GLP-1 agonists.  COVID-19 Education: The signs and symptoms of COVID-19 were discussed with the patient and how to seek care for testing (follow up with PCP or arrange E-visit).  The importance of social distancing was discussed today.  Patient Risk:   After full review of this patients clinical status, I feel that they are at least moderate risk at this time.  Time:   Today, I have spent 12 minutes/ seconds with the patient with telehealth technology discussing above diagnoses.     Medication Adjustments/Labs and Tests Ordered: Current medicines are reviewed at length with the patient today.  Concerns regarding medicines are outlined above.   Tests Ordered: No orders of the defined types were placed in this encounter.   Medication Changes: No orders of the defined types were placed  in this encounter.   Disposition:  Follow up prn  Signed, [name], MD

## 2022-04-05 NOTE — Patient Instructions (Signed)

## 2022-04-21 ENCOUNTER — Encounter: Payer: Self-pay | Admitting: Internal Medicine

## 2022-05-09 ENCOUNTER — Other Ambulatory Visit (HOSPITAL_BASED_OUTPATIENT_CLINIC_OR_DEPARTMENT_OTHER): Payer: Self-pay

## 2022-05-09 ENCOUNTER — Other Ambulatory Visit (HOSPITAL_COMMUNITY): Payer: Self-pay

## 2022-05-09 ENCOUNTER — Other Ambulatory Visit: Payer: Self-pay

## 2022-05-09 ENCOUNTER — Other Ambulatory Visit: Payer: Self-pay | Admitting: Internal Medicine

## 2022-05-09 MED ORDER — SEMAGLUTIDE (1 MG/DOSE) 4 MG/3ML ~~LOC~~ SOPN
1.0000 mg | PEN_INJECTOR | SUBCUTANEOUS | 1 refills | Status: DC
  Filled 2022-05-09 – 2022-05-15 (×2): qty 3, 28d supply, fill #0

## 2022-05-10 ENCOUNTER — Other Ambulatory Visit (HOSPITAL_COMMUNITY): Payer: Self-pay

## 2022-05-10 ENCOUNTER — Other Ambulatory Visit: Payer: Self-pay | Admitting: Internal Medicine

## 2022-05-14 ENCOUNTER — Other Ambulatory Visit (HOSPITAL_COMMUNITY): Payer: Self-pay

## 2022-05-14 ENCOUNTER — Other Ambulatory Visit: Payer: Self-pay

## 2022-05-15 ENCOUNTER — Other Ambulatory Visit: Payer: Self-pay

## 2022-05-23 ENCOUNTER — Ambulatory Visit: Payer: BC Managed Care – PPO | Admitting: Internal Medicine

## 2022-05-23 ENCOUNTER — Other Ambulatory Visit: Payer: Self-pay

## 2022-05-23 ENCOUNTER — Encounter: Payer: Self-pay | Admitting: Internal Medicine

## 2022-05-23 VITALS — BP 150/80 | HR 78 | Temp 98.1°F | Ht 64.4 in | Wt 217.8 lb

## 2022-05-23 DIAGNOSIS — I129 Hypertensive chronic kidney disease with stage 1 through stage 4 chronic kidney disease, or unspecified chronic kidney disease: Secondary | ICD-10-CM

## 2022-05-23 DIAGNOSIS — E1122 Type 2 diabetes mellitus with diabetic chronic kidney disease: Secondary | ICD-10-CM

## 2022-05-23 DIAGNOSIS — G4733 Obstructive sleep apnea (adult) (pediatric): Secondary | ICD-10-CM | POA: Diagnosis not present

## 2022-05-23 DIAGNOSIS — Z6836 Body mass index (BMI) 36.0-36.9, adult: Secondary | ICD-10-CM

## 2022-05-23 DIAGNOSIS — N182 Chronic kidney disease, stage 2 (mild): Secondary | ICD-10-CM

## 2022-05-23 DIAGNOSIS — Z23 Encounter for immunization: Secondary | ICD-10-CM

## 2022-05-23 DIAGNOSIS — F5101 Primary insomnia: Secondary | ICD-10-CM

## 2022-05-23 MED ORDER — VORTIOXETINE HBR 20 MG PO TABS: 20.0000 mg | ORAL_TABLET | Freq: Every day | ORAL | 1 refills | Status: DC

## 2022-05-23 MED ORDER — OZEMPIC (2 MG/DOSE) 8 MG/3ML ~~LOC~~ SOPN
2.0000 mg | PEN_INJECTOR | SUBCUTANEOUS | 3 refills | Status: DC
  Filled 2022-05-23: qty 3, 28d supply, fill #0

## 2022-05-23 NOTE — Progress Notes (Signed)
Rich Brave Llittleton,acting as a Education administrator for [name], MD.,have documented all relevant documentation on the behalf of [name], MD,as directed by  [name], MD while in the presence of [name], MD.    Subjective:     Patient ID: Brittany Rubio , female    DOB: [birth year] , 61 y.o.   MRN: [id number]   Chief Complaint  Patient presents with   Diabetes    HPI  Pt presents today DM/HTN check. She reports compliance with meds. Denies headaches, chest pain and shortness of breath. She is requesting a new sleep Cpap machine. She has yet to contact her DME supplier regarding her machine.    Diabetes She presents for her follow-up diabetic visit. She has type 2 diabetes mellitus. Her disease course has been stable. Pertinent negatives for hypoglycemia include no dizziness. Pertinent negatives for diabetes include no blurred vision, no chest pain, no polydipsia, no polyphagia and no polyuria. There are no hypoglycemic complications. Diabetic complications include nephropathy. Risk factors for coronary artery disease include diabetes mellitus, dyslipidemia, hypertension, obesity, sedentary lifestyle, post-menopausal and stress. Her weight is fluctuating minimally. She is following a diabetic diet.  Hypertension This is a chronic problem. The current episode started more than 1 year ago. The problem has been gradually improving since onset. The problem is uncontrolled. Pertinent negatives include no blurred vision, chest pain, palpitations or shortness of breath.     Past Medical History:  Diagnosis Date   ADD (attention deficit disorder)    Anemia    Anxiety    Arthritis    "left shoulder" (07/07/2014)   Back pain    Breast cancer ([place])    "left"   Depression    Fatty liver    Food allergy    shellfish   GERD (gastroesophageal reflux disease)    "takes over the counters as needed"   Hernia, umbilical    High cholesterol    Hypertension 11/26/2011   sees  Dr. Bryon Lions   Hypothyroidism    Incisional hernia without obstruction or gangrene 06/05/2018   Joint pain    Migraines    "maybe once q 3 months" (07/07/2014)   Multinodular thyroid 2015   OSA on CPAP    Personal history of radiation therapy    Pneumonia    May 2018   Sepsis due to Streptococcus pneumoniae (HCC)    Septic shock (HCC)    Severe sepsis ([place]) 06/05/2019   Thyroid cancer ([place])    2015   Type II diabetes mellitus ([place])    Vitamin D deficiency      Family History  Problem Relation Age of Onset   Cancer Mother        Pancreatic   Diabetes Mother    Hypertension Mother    Depression Mother    Hypertension Father    Alcoholism Father      Current Outpatient Medications:    amphetamine-dextroamphetamine (ADDERALL XR) 20 MG 24 hr capsule, Take 20 mg by mouth daily. 1 per day with the 10 mg and 30 mg xr, Disp: , Rfl:    amphetamine-dextroamphetamine (ADDERALL XR) 30 MG 24 hr capsule, Take 30 mg by mouth daily. Take 1 capsule with the 10 mg and the 20 mg xr, Disp: , Rfl:    amphetamine-dextroamphetamine (ADDERALL) 10 MG tablet, Take 10 mg by mouth daily with breakfast. Take 1 per day with the 20 mg xr and the 30 mg xr, Disp: , Rfl:  benzonatate (TESSALON PERLES) 100 MG capsule, Take 1 capsule (100 mg total) by mouth 3 (three) times daily as needed., Disp: 20 capsule, Rfl: 0   cholecalciferol (VITAMIN D3) 25 MCG (1000 UT) tablet, Take 1,000 Units by mouth daily., Disp: , Rfl:    Continuous Blood Gluc Receiver ([place]) DEVI, Use as directed to check blood sugars 3 times per day dx e.11.22, Disp: 3 each, Rfl: 3   Continuous Blood Gluc Sensor (DEXCOM G6 SENSOR) MISC, Use as directed to check blood sugars 3 times per day dx e.11.22, Disp: 3 each, Rfl: 3   Continuous Blood Gluc Transmit (DEXCOM G6 TRANSMITTER) MISC, Use as directed to check blood sugars 3 times per day dx e.11.22, Disp: 3 each, Rfl: 3   Cyanocobalamin (VITAMIN B-12 PO), Take 1 tablet by  mouth once a week. Tuesdays, Disp: , Rfl:    diazepam (VALIUM) 5 MG tablet, TAKE 1 TABLET BY MOUTH EVERY DAY AS NEEDED, Disp: 20 tablet, Rfl: 0   EPINEPHrine 0.3 mg/0.3 mL IJ SOAJ injection, Inject 0.3 mg into the muscle once as needed. Allergic reaction, Disp: 1 each, Rfl: 3   famotidine (PEPCID) 20 MG tablet, Take 20 mg by mouth as needed for heartburn or indigestion., Disp: , Rfl:    levocetirizine (XYZAL) 5 MG tablet, TAKE 1 TABLET BY MOUTH EVERY DAY IN THE EVENING, Disp: 90 tablet, Rfl: 1   Levothyroxine Sodium 175 MCG/ML SOLN, Take 175 mcg by mouth daily., Disp: 30 mL, Rfl: 5   lisinopril-hydrochlorothiazide (ZESTORETIC) 20-25 MG tablet, TAKE 1 TABLET BY MOUTH EVERY DAY, Disp: 90 tablet, Rfl: 1   Magnesium 500 MG CAPS, Take 500 mg by mouth daily., Disp: , Rfl:    pravastatin (PRAVACHOL) 80 MG tablet, TAKE ONE TAB MON-SUNDAY, Disp: 75 tablet, Rfl: 2   promethazine (PHENERGAN) 25 MG tablet, TAKE 1 TABLET BY MOUTH EVERY 4-6 HOURS AS NEEDED, Disp: 30 tablet, Rfl: 0   Rimegepant Sulfate (NURTEC) 75 MG TBDP, Take 75 mg by mouth daily as needed., Disp: 10 tablet, Rfl: 2   Semaglutide, 2 MG/DOSE, (OZEMPIC, 2 MG/DOSE,) 8 MG/3ML SOPN, Inject 2 mg into the skin once a week., Disp: 3 mL, Rfl: 3   TIADYLT ER 240 MG 24 hr capsule, TAKE 1 TABLET BY MOUTH AT BEDTIME FOR BLOOD PRESSURE, Disp: 90 capsule, Rfl: 1   vitamin C (ASCORBIC ACID) 500 MG tablet, Take 500 mg by mouth daily., Disp: , Rfl:    XIGDUO XR 11-998 MG TB24, TAKE 1 TABLET BY MOUTH EVERY DAY, Disp: 30 tablet, Rfl: 1   vortioxetine HBr (TRINTELLIX) 20 MG TABS tablet, Take 1 tablet (20 mg total) by mouth daily., Disp: 90 tablet, Rfl: 1   Allergies  Allergen Reactions   Crab [Shellfish Allergy] Hives and Swelling    "Only when I eat too many crab legs" Can tolerate CT scans with contrast-does not need premeds       Review of Systems  Constitutional: Negative.   Eyes: Negative.  Negative for blurred vision.  Respiratory: Negative.   Negative for shortness of breath.   Cardiovascular: Negative.  Negative for chest pain and palpitations.  Gastrointestinal: Negative.   Endocrine: Negative for polydipsia, polyphagia and polyuria.  Musculoskeletal: Negative.   Skin: Negative.   Neurological: Negative.  Negative for dizziness.  Psychiatric/Behavioral: Negative.       Today's Vitals   05/23/22 1558 05/23/22 1613  BP: (!) 150/80 (!) 150/80  Pulse: 78   Temp: 98.1 F (36.7 C)   Weight:  217 lb 12.8 oz (98.8 kg)   Height: 5' 4.4" (1.636 m)   PainSc: 0-No pain    Body mass index is 36.92 kg/m.  Wt Readings from Last 3 Encounters:  05/23/22 217 lb 12.8 oz (98.8 kg)  02/19/22 221 lb 3.2 oz (100.3 kg)  11/13/21 216 lb 6.4 oz (98.2 kg)     Objective:  Physical Exam Vitals and nursing note reviewed.  Constitutional:      Appearance: Normal appearance. She is obese.  HENT:     Head: Normocephalic and atraumatic.     Nose:     Comments: Masked    Mouth/Throat:     Comments: Masked  Eyes:     Extraocular Movements: Extraocular movements intact.  Cardiovascular:     Rate and Rhythm: Normal rate and regular rhythm.     Heart sounds: Normal heart sounds.  Pulmonary:     Effort: Pulmonary effort is normal.     Breath sounds: Normal breath sounds.  Musculoskeletal:     Cervical back: Normal range of motion.  Skin:    General: Skin is warm.  Neurological:     General: No focal deficit present.     Mental Status: She is alert.  Psychiatric:        Mood and Affect: Mood normal.        Behavior: Behavior normal.       Assessment And Plan:     1. Type 2 diabetes mellitus with stage 2 chronic kidney disease, without long-term current use of insulin (HCC) Comments: Chronic, I will check labs as below. Importance of dietary/medicine compliance was d/w patient. Will likely need to increase Xigduo to twice daily. F/u 3 mos. - CMP14+EGFR - Hemoglobin A1c  2. Hypertensive nephropathy Comments: Chronic,  uncontrolled. She will c/w lisinopril/hctz 20/67m daily.  I will add amlodipine 510mnightly to her current regimen, f/u in 3 wks for nurse visit.  3. Primary insomnia Comments: She was given samples of Belsomra, 1538mightly to use prn. Also encouraged to develop a bedtime routine.  4. OSA (obstructive sleep apnea) Comments: Chronic, encouraged to contact her DME company regarding new CPAP machine.  5. Class 2 severe obesity due to excess calories with serious comorbidity and body mass index (BMI) of 36.0 to 36.9 in adult [facility]: She is encouraged to aim for at least 150 minutes of exercise per week. She is not interested in PREP program at this time.  6. Immunization due - Flu Vaccine QUAD 6+ mos PF IM (Fluarix Quad PF)   Patient was given opportunity to ask questions. Patient verbalized understanding of the plan and was able to repeat key elements of the plan. All questions were answered to their satisfaction.   I, RobMaximino GreenlandD, have reviewed all documentation for this visit. The documentation on 05/23/22 for the exam, diagnosis, procedures, and orders are all accurate and complete.   IF YOU HAVE BEEN REFERRED TO A SPECIALIST, IT MAY TAKE 1-2 WEEKS TO SCHEDULE/PROCESS THE REFERRAL. IF YOU HAVE NOT HEARD FROM US/SPECIALIST IN TWO WEEKS, PLEASE GIVE US [place]CALL AT [phone].   THE PATIENT IS ENCOURAGED TO PRACTICE SOCIAL DISTANCING DUE TO THE COVID-19 PANDEMIC.

## 2022-05-23 NOTE — Patient Instructions (Signed)

## 2022-05-24 ENCOUNTER — Other Ambulatory Visit: Payer: Self-pay

## 2022-05-24 LAB — CMP14+EGFR
ALT: 36 IU/L — ABNORMAL HIGH (ref 0–32)
AST: 34 IU/L (ref 0–40)
Albumin/Globulin Ratio: 1.2 (ref 1.2–2.2)
Albumin: 4.2 g/dL (ref 3.9–4.9)
Alkaline Phosphatase: 64 IU/L (ref 44–121)
BUN/Creatinine Ratio: 20 (ref 12–28)
BUN: 20 mg/dL (ref 8–27)
Bilirubin Total: <0.2 mg/dL (ref 0.0–1.2)
CO2: 26 mmol/L (ref 20–29)
Calcium: 9.9 mg/dL (ref 8.7–10.3)
Chloride: 97 mmol/L (ref 96–106)
Creatinine, Ser: 0.98 mg/dL (ref 0.57–1.00)
Globulin, Total: 3.5 g/dL (ref 1.5–4.5)
Glucose: 132 mg/dL — ABNORMAL HIGH (ref 70–99)
Potassium: 4.4 mmol/L (ref 3.5–5.2)
Sodium: 141 mmol/L (ref 134–144)
Total Protein: 7.7 g/dL (ref 6.0–8.5)
eGFR: 65 mL/min/{1.73_m2} (ref >59)

## 2022-05-24 LAB — HEMOGLOBIN A1C
Est. average glucose Bld gHb Est-mCnc: 243 mg/dL
Hgb A1c MFr Bld: 10.1 % — ABNORMAL HIGH (ref 4.8–5.6)

## 2022-05-27 ENCOUNTER — Encounter: Payer: Self-pay | Admitting: Internal Medicine

## 2022-05-28 ENCOUNTER — Other Ambulatory Visit: Payer: Self-pay | Admitting: Internal Medicine

## 2022-05-28 ENCOUNTER — Other Ambulatory Visit: Payer: Self-pay

## 2022-05-28 MED ORDER — XIGDUO XR 5-1000 MG PO TB24: 1.0000 | ORAL_TABLET | Freq: Two times a day (BID) | ORAL | 1 refills | Status: DC

## 2022-05-28 MED ORDER — XIGDUO XR 5-1000 MG PO TB24: ORAL_TABLET | ORAL | 5 refills | Status: DC

## 2022-05-29 ENCOUNTER — Other Ambulatory Visit: Payer: Self-pay

## 2022-05-29 ENCOUNTER — Telehealth: Payer: Self-pay

## 2022-05-29 MED ORDER — AMLODIPINE BESYLATE 5 MG PO TABS: 5.0000 mg | ORAL_TABLET | Freq: Every day | ORAL | 1 refills | Status: DC

## 2022-05-29 MED ORDER — XIGDUO XR 5-1000 MG PO TB24: 1.0000 | ORAL_TABLET | Freq: Two times a day (BID) | ORAL | 1 refills | Status: DC

## 2022-05-29 NOTE — Telephone Encounter (Signed)
Pt aware, patient notified, amlodipine '5mg'$  sent to the pharmacy. she needs to take nightly, keep taking lisinpril/hctz in am. Provider states she is getting low on the lisinopril. She wants to switch her to a different medication. Patient will call back tomorrow to sch nurse visit in 2-3 weeks. Went over labs. Pt says no to insulin. Xigduo increased to 11/998 twice daily sent #180/1 refill sent to the pharmacy.

## 2022-06-06 ENCOUNTER — Other Ambulatory Visit: Payer: Self-pay | Admitting: Internal Medicine

## 2022-06-06 MED ORDER — PROMETHAZINE HCL 25 MG PO TABS: ORAL_TABLET | ORAL | 0 refills | Status: DC

## 2022-06-12 ENCOUNTER — Ambulatory Visit: Payer: Self-pay

## 2022-06-14 ENCOUNTER — Ambulatory Visit: Payer: BC Managed Care – PPO

## 2022-06-14 VITALS — BP 138/92 | HR 85 | Temp 98.1°F | Ht 64.0 in | Wt 217.0 lb

## 2022-06-14 DIAGNOSIS — I129 Hypertensive chronic kidney disease with stage 1 through stage 4 chronic kidney disease, or unspecified chronic kidney disease: Secondary | ICD-10-CM

## 2022-06-14 MED ORDER — AMLODIPINE BESYLATE 10 MG PO TABS: 10.0000 mg | ORAL_TABLET | Freq: Every day | ORAL | 1 refills | Status: DC

## 2022-06-14 NOTE — Patient Instructions (Signed)
Hypertension, Adult ?Hypertension is another name for high blood pressure. High blood pressure forces your heart to work harder to pump blood. This can cause problems over time. ?There are two numbers in a blood pressure reading. There is a top number (systolic) over a bottom number (diastolic). It is best to have a blood pressure that is below 120/80. ?What are the causes? ?The cause of this condition is not known. Some other conditions can lead to high blood pressure. ?What increases the risk? ?Some lifestyle factors can make you more likely to develop high blood pressure: ?Smoking. ?Not getting enough exercise or physical activity. ?Being overweight. ?Having too much fat, sugar, calories, or salt (sodium) in your diet. ?Drinking too much alcohol. ?Other risk factors include: ?Having any of these conditions: ?Heart disease. ?Diabetes. ?High cholesterol. ?Kidney disease. ?Obstructive sleep apnea. ?Having a family history of high blood pressure and high cholesterol. ?Age. The risk increases with age. ?Stress. ?What are the signs or symptoms? ?High blood pressure may not cause symptoms. Very high blood pressure (hypertensive crisis) may cause: ?Headache. ?Fast or uneven heartbeats (palpitations). ?Shortness of breath. ?Nosebleed. ?Vomiting or feeling like you may vomit (nauseous). ?Changes in how you see. ?Very bad chest pain. ?Feeling dizzy. ?Seizures. ?How is this treated? ?This condition is treated by making healthy lifestyle changes, such as: ?Eating healthy foods. ?Exercising more. ?Drinking less alcohol. ?Your doctor may prescribe medicine if lifestyle changes do not help enough and if: ?Your top number is above 130. ?Your bottom number is above 80. ?Your personal target blood pressure may vary. ?Follow these instructions at home: ?Eating and drinking ? ?If told, follow the DASH eating plan. To follow this plan: ?Fill one half of your plate at each meal with fruits and vegetables. ?Fill one fourth of your plate  at each meal with whole grains. Whole grains include whole-wheat pasta, brown rice, and whole-grain bread. ?Eat or drink low-fat dairy products, such as skim milk or low-fat yogurt. ?Fill one fourth of your plate at each meal with low-fat (lean) proteins. Low-fat proteins include fish, chicken without skin, eggs, beans, and tofu. ?Avoid fatty meat, cured and processed meat, or chicken with skin. ?Avoid pre-made or processed food. ?Limit the amount of salt in your diet to less than 1,500 mg each day. ?Do not drink alcohol if: ?Your doctor tells you not to drink. ?You are pregnant, may be pregnant, or are planning to become pregnant. ?If you drink alcohol: ?Limit how much you have to: ?0-1 drink a day for women. ?0-2 drinks a day for men. ?Know how much alcohol is in your drink. In the [place] one drink equals one 12 oz bottle of beer (355 mL), one 5 oz glass of wine (148 mL), or one 1? oz glass of hard liquor (44 mL). ?Lifestyle ? ?Work with your doctor to stay at a healthy weight or to lose weight. Ask your doctor what the best weight is for you. ?Get at least 30 minutes of exercise that causes your heart to beat faster (aerobic exercise) most days of the week. This may include walking, swimming, or biking. ?Get at least 30 minutes of exercise that strengthens your muscles (resistance exercise) at least 3 days a week. This may include lifting weights or doing Pilates. ?Do not smoke or use any products that contain nicotine or tobacco. If you need help quitting, ask your doctor. ?Check your blood pressure at home as told by your doctor. ?Keep all follow-up visits. ?Medicines ?Take over-the-counter and prescription medicines   only as told by your doctor. Follow directions carefully. ?Do not skip doses of blood pressure medicine. The medicine does not work as well if you skip doses. Skipping doses also puts you at risk for problems. ?Ask your doctor about side effects or reactions to medicines that you should watch  for. ?Contact a doctor if: ?You think you are having a reaction to the medicine you are taking. ?You have headaches that keep coming back. ?You feel dizzy. ?You have swelling in your ankles. ?You have trouble with your vision. ?Get help right away if: ?You get a very bad headache. ?You start to feel mixed up (confused). ?You feel weak or numb. ?You feel faint. ?You have very bad pain in your: ?Chest. ?Belly (abdomen). ?You vomit more than once. ?You have trouble breathing. ?These symptoms may be an emergency. Get help right away. Call 911. ?Do not wait to see if the symptoms will go away. ?Do not drive yourself to the hospital. ?Summary ?Hypertension is another name for high blood pressure. ?High blood pressure forces your heart to work harder to pump blood. ?For most people, a normal blood pressure is less than 120/80. ?Making healthy choices can help lower blood pressure. If your blood pressure does not get lower with healthy choices, you may need to take medicine. ?This information is not intended to replace advice given to you by your health care provider. Make sure you discuss any questions you have with your health care provider. ?Document Revised: 04/20/2021 Document Reviewed: 04/20/2021 ?Elsevier Patient Education ? 2023 [employer] ? ?

## 2022-06-14 NOTE — Progress Notes (Signed)
Pt presents today for bpc. She currently takes Amlodipine '5MG'$  at night & lisinopril HCTZ 20-25 in the morning she took med around 615/630 today. She states samples of Belsomra '20mg'$  were given to her, they do not work, patient notified this is a nurse visit, other concerns may need an apt. She reports drinking 3 1/2 bottles of water a day. She also exercises 3-4 times a week ,walking a mile. She does have a headache currently, has not had lunch yet. Denies chest pain, SOB, blurred vision, chest pain BP Readings from Last 3 Encounters:  06/14/22 (!) 138/92  05/23/22 (!) 150/80  02/19/22 (!) 144/98  Initial bp: 142/100 after 10 mins 138/92. Per provider, Amlodipine '5MG'$  increased to '10mg'$ . Patient will come back in 3 weeks, in accordance to her schedule for bpc.

## 2022-07-05 ENCOUNTER — Ambulatory Visit: Payer: Self-pay | Admitting: Internal Medicine

## 2022-07-05 ENCOUNTER — Ambulatory Visit: Payer: BC Managed Care – PPO

## 2022-07-14 ENCOUNTER — Other Ambulatory Visit: Payer: Self-pay | Admitting: Internal Medicine

## 2022-07-14 DIAGNOSIS — I129 Hypertensive chronic kidney disease with stage 1 through stage 4 chronic kidney disease, or unspecified chronic kidney disease: Secondary | ICD-10-CM

## 2022-07-17 ENCOUNTER — Ambulatory Visit: Payer: BC Managed Care – PPO

## 2022-07-17 VITALS — BP 138/74 | HR 99 | Temp 98.8°F

## 2022-07-17 DIAGNOSIS — I129 Hypertensive chronic kidney disease with stage 1 through stage 4 chronic kidney disease, or unspecified chronic kidney disease: Secondary | ICD-10-CM

## 2022-07-17 MED ORDER — VALSARTAN-HYDROCHLOROTHIAZIDE 160-25 MG PO TABS: 1.0000 | ORAL_TABLET | Freq: Every day | ORAL | 1 refills | Status: DC

## 2022-07-17 NOTE — Progress Notes (Signed)
Patient presents today for BPC. She is currently taking Amolidipine '10mg'$   lisinopril-hctz  20/'25mg'$  and Tiadylt '240mg'$ .   BP Readings from Last 3 Encounters:  07/17/22 (!) 140/72  06/14/22 (!) 138/92  05/23/22 (!) 150/80   Provider would like for pt to continue with current medication at this time and would like to change her lisinopril to Valsartan/hctz 160/'25mg'$  when she runs out. Pt instructed to call office with the amount of tablets she has to determine when her follow up will be.

## 2022-07-24 ENCOUNTER — Other Ambulatory Visit: Payer: Self-pay | Admitting: Internal Medicine

## 2022-07-26 ENCOUNTER — Other Ambulatory Visit: Payer: Self-pay | Admitting: Internal Medicine

## 2022-08-09 ENCOUNTER — Other Ambulatory Visit: Payer: Self-pay | Admitting: Internal Medicine

## 2022-08-09 DIAGNOSIS — C50919 Malignant neoplasm of unspecified site of unspecified female breast: Secondary | ICD-10-CM

## 2022-08-27 ENCOUNTER — Telehealth: Payer: Self-pay

## 2022-09-03 ENCOUNTER — Encounter: Payer: Self-pay | Admitting: Internal Medicine

## 2022-09-03 ENCOUNTER — Ambulatory Visit: Payer: BC Managed Care – PPO | Admitting: Internal Medicine

## 2022-09-03 VITALS — BP 138/77 | HR 98 | Temp 98.2°F | Ht 64.0 in | Wt 205.4 lb

## 2022-09-03 DIAGNOSIS — N182 Chronic kidney disease, stage 2 (mild): Secondary | ICD-10-CM | POA: Diagnosis not present

## 2022-09-03 DIAGNOSIS — Z6835 Body mass index (BMI) 35.0-35.9, adult: Secondary | ICD-10-CM

## 2022-09-03 DIAGNOSIS — I129 Hypertensive chronic kidney disease with stage 1 through stage 4 chronic kidney disease, or unspecified chronic kidney disease: Secondary | ICD-10-CM | POA: Diagnosis not present

## 2022-09-03 DIAGNOSIS — E1122 Type 2 diabetes mellitus with diabetic chronic kidney disease: Secondary | ICD-10-CM

## 2022-09-03 DIAGNOSIS — F419 Anxiety disorder, unspecified: Secondary | ICD-10-CM

## 2022-09-03 DIAGNOSIS — R7989 Other specified abnormal findings of blood chemistry: Secondary | ICD-10-CM | POA: Diagnosis not present

## 2022-09-03 MED ORDER — DIAZEPAM 5 MG PO TABS: ORAL_TABLET | ORAL | 0 refills | Status: DC

## 2022-09-03 NOTE — Progress Notes (Signed)
Barnet Glasgow Martin,acting as a Education administrator for [name], MD.,have documented all relevant documentation on the behalf of [name], MD,as directed by  [name], MD while in the presence of [name], MD.    Subjective:     Patient ID: Brittany Rubio , female    DOB: November 03, 1960 , 62 y.o.   MRN: [id number]   Chief Complaint  Patient presents with  . Hypertension    HPI  Patient presents today for a BP and DM check. Patient states compliance with medications. Patient states when she went up to the 65ms of ozempic so she went back down to 144mof ozempic. She states throwing up for 3 days straight.  BP Readings from Last 3 Encounters: 09/03/22 : 138/77 07/17/22 : 138/74 06/14/22 : (!) 138/92    Hypertension This is a chronic problem. The current episode started more than 1 year ago. The problem has been gradually improving since onset. The problem is uncontrolled. Pertinent negatives include no blurred vision, chest pain, palpitations or shortness of breath.  Diabetes She presents for her follow-up diabetic visit. She has type 2 diabetes mellitus. Her disease course has been stable. Pertinent negatives for hypoglycemia include no dizziness. Pertinent negatives for diabetes include no blurred vision, no chest pain, no polydipsia, no polyphagia and no polyuria. There are no hypoglycemic complications. Diabetic complications include nephropathy. Risk factors for coronary artery disease include diabetes mellitus, dyslipidemia, hypertension, obesity, sedentary lifestyle, post-menopausal and stress. Her weight is fluctuating minimally. She is following a diabetic diet.     Past Medical History:  Diagnosis Date  . ADD (attention deficit disorder)   . Anemia   . Anxiety   . Arthritis    "left shoulder" (07/07/2014)  . Back pain   . Breast cancer ([facility]   "left"  . Depression   . Fatty liver   . Food allergy    shellfish  . GERD (gastroesophageal reflux disease)     "takes over the counters as needed"  . Hernia, umbilical   . High cholesterol   . Hypertension 11/26/2011   sees Dr. RoBryon Lions. Hypothyroidism   . Incisional hernia without obstruction or gangrene 06/05/2018  . Joint pain   . Migraines    "maybe once q 3 months" (07/07/2014)  . Multinodular thyroid 2015  . OSA on CPAP   . Personal history of radiation therapy   . Pneumonia    May 2018  . Sepsis due to Streptococcus pneumoniae ([facility]  . Septic shock ([facility]  . Severe sepsis ([facility]  . Thyroid cancer ([facility]   2015  . Type II diabetes mellitus ([facility]  . Vitamin D deficiency      Family History  Problem Relation Age of Onset  . Cancer Mother        Pancreatic  . Diabetes Mother   . Hypertension Mother   . Depression Mother   . Hypertension Father   . Alcoholism Father      Current Outpatient Medications:  .  amLODipine (NORVASC) 10 MG tablet, TAKE 1 TABLET BY MOUTH EVERY DAY, Disp: 90 tablet, Rfl: 1 .  amphetamine-dextroamphetamine (ADDERALL XR) 20 MG 24 hr capsule, Take 20 mg by mouth daily. 1 per day with the 10 mg and 30 mg xr, Disp: , Rfl:  .  amphetamine-dextroamphetamine (ADDERALL XR) 30 MG 24 hr capsule, Take 30 mg by mouth daily. Take 1 capsule with the 10 mg and  the 20 mg xr, Disp: , Rfl:  .  amphetamine-dextroamphetamine (ADDERALL) 10 MG tablet, Take 10 mg by mouth daily with breakfast. Take 1 per day with the 20 mg xr and the 30 mg xr, Disp: , Rfl:  .  cholecalciferol (VITAMIN D3) 25 MCG (1000 UT) tablet, Take 1,000 Units by mouth daily., Disp: , Rfl:  .  Continuous Blood Gluc Receiver (DEXCOM G6 RECEIVER) DEVI, Use as directed to check blood sugars 3 times per day dx e.11.22, Disp: 3 each, Rfl: 3 .  Continuous Blood Gluc Sensor (DEXCOM G6 SENSOR) MISC, Use as directed to check blood sugars 3 times per day dx e.11.22, Disp: 3 each, Rfl: 3 .  Continuous Blood Gluc Transmit (DEXCOM G6 TRANSMITTER) MISC, Use as directed to check blood sugars 3 times per  day dx e.11.22, Disp: 3 each, Rfl: 3 .  Cyanocobalamin (VITAMIN B-12 PO), Take 1 tablet by mouth once a week. Tuesdays, Disp: , Rfl:  .  Dapagliflozin Pro-metFORMIN ER (XIGDUO XR) 11-998 MG TB24, Take 1 tablet by mouth in the morning and at bedtime. One tab po twice daily, Disp: 180 tablet, Rfl: 1 .  diazepam (VALIUM) 5 MG tablet, TAKE 1 TABLET BY MOUTH EVERY DAY AS NEEDED, Disp: 20 tablet, Rfl: 0 .  EPINEPHrine 0.3 mg/0.3 mL IJ SOAJ injection, Inject 0.3 mg into the muscle once as needed. Allergic reaction, Disp: 1 each, Rfl: 3 .  famotidine (PEPCID) 20 MG tablet, Take 20 mg by mouth as needed for heartburn or indigestion., Disp: , Rfl:  .  levocetirizine (XYZAL) 5 MG tablet, TAKE 1 TABLET BY MOUTH EVERY DAY IN THE EVENING, Disp: 90 tablet, Rfl: 1 .  Levothyroxine Sodium 175 MCG/ML SOLN, Take 175 mcg by mouth daily., Disp: 30 mL, Rfl: 5 .  lisinopril-hydrochlorothiazide (ZESTORETIC) 20-25 MG tablet, TAKE 1 TABLET BY MOUTH EVERY DAY, Disp: 90 tablet, Rfl: 1 .  Magnesium 500 MG CAPS, Take 500 mg by mouth daily., Disp: , Rfl:  .  pravastatin (PRAVACHOL) 80 MG tablet, TAKE ONE TAB MON-SUNDAY, Disp: 75 tablet, Rfl: 2 .  promethazine (PHENERGAN) 25 MG tablet, TAKE 1 TABLET BY MOUTH EVERY 8 HOURS AS NEEDED, Disp: 30 tablet, Rfl: 0 .  Rimegepant Sulfate (NURTEC) 75 MG TBDP, Take 75 mg by mouth daily as needed., Disp: 10 tablet, Rfl: 2 .  TIADYLT ER 240 MG 24 hr capsule, TAKE 1 TABLET BY MOUTH AT BEDTIME FOR BLOOD PRESSURE, Disp: 90 capsule, Rfl: 1 .  valsartan-hydrochlorothiazide (DIOVAN HCT) 160-25 MG tablet, Take 1 tablet by mouth daily., Disp: 90 tablet, Rfl: 1 .  vitamin C (ASCORBIC ACID) 500 MG tablet, Take 500 mg by mouth daily., Disp: , Rfl:  .  vortioxetine HBr (TRINTELLIX) 20 MG TABS tablet, Take 1 tablet (20 mg total) by mouth daily., Disp: 90 tablet, Rfl: 1 .  Semaglutide, 2 MG/DOSE, (OZEMPIC, 2 MG/DOSE,) 8 MG/3ML SOPN, Inject 2 mg into the skin once a week., Disp: 3 mL, Rfl: 3   Allergies   Allergen Reactions  . Crab [Shellfish Allergy] Hives and Swelling    "Only when I eat too many crab legs" Can tolerate CT scans with contrast-does not need premeds       Review of Systems  Constitutional: Negative.   HENT: Negative.    Eyes: Negative.  Negative for blurred vision.  Respiratory: Negative.  Negative for shortness of breath.   Cardiovascular: Negative.  Negative for chest pain and palpitations.  Gastrointestinal: Negative.   Endocrine: Negative for polydipsia, polyphagia and  polyuria.  Neurological:  Negative for dizziness.     Today's Vitals   09/03/22 1602  BP: 138/77  Pulse: 98  Temp: 98.2 F (36.8 C)  TempSrc: Oral  Weight: 205 lb 6.4 oz (93.2 kg)  Height: 5' 4"$  (1.626 m)  PainSc: 0-No pain   Body mass index is 35.26 kg/m.  Wt Readings from Last 3 Encounters:  09/03/22 205 lb 6.4 oz (93.2 kg)  06/14/22 217 lb (98.4 kg)  05/23/22 217 lb 12.8 oz (98.8 kg)    Objective:  Physical Exam Vitals and nursing note reviewed.  Constitutional:      Appearance: Normal appearance.  HENT:     Head: Normocephalic and atraumatic.     Nose:     Comments: Masked     Mouth/Throat:     Comments: Masked  Eyes:     Extraocular Movements: Extraocular movements intact.  Cardiovascular:     Rate and Rhythm: Normal rate and regular rhythm.     Heart sounds: Normal heart sounds.  Pulmonary:     Effort: Pulmonary effort is normal.     Breath sounds: Normal breath sounds.  Musculoskeletal:     Cervical back: Normal range of motion.  Skin:    General: Skin is warm.  Neurological:     General: No focal deficit present.     Mental Status: She is alert.  Psychiatric:        Mood and Affect: Mood normal.        Behavior: Behavior normal.        Assessment And Plan:     1. Hypertensive nephropathy - BMP8+eGFR  2. Elevated LFTs - Liver Profile  3. Type 2 diabetes mellitus with stage 2 chronic kidney disease, without long-term current use of insulin (HCC) -  BMP8+eGFR - Hemoglobin A1c  4. 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 Patient verbalized understanding of the plan and was able to repeat key elements of the plan. All questions were answered to their satisfaction.   I, [name], MD, have reviewed all documentation for this visit. The documentation on 09/03/22 for the exam, diagnosis, procedures, and orders are all accurate and complete.   IF YOU HAVE BEEN REFERRED TO A SPECIALIST, IT MAY TAKE 1-2 WEEKS TO SCHEDULE/PROCESS THE REFERRAL. IF YOU HAVE NOT HEARD FROM US/SPECIALIST IN TWO WEEKS, PLEASE GIVE [place] A CALL AT [phone].   THE PATIENT IS ENCOURAGED TO PRACTICE SOCIAL DISTANCING DUE TO THE COVID-19 PANDEMIC.

## 2022-09-03 NOTE — Patient Instructions (Addendum)
Resume Ozempic 0.5 - 18 clicks x 2 weeks, then 28m - 36 clicks x 2 weeks, then 54 clicks - [id number]  Please send magnesium type and dose  (Magnesium glycinate is great for sleep)  Hypertension, Adult Hypertension is another name for high blood pressure. High blood pressure forces your heart to work harder to pump blood. This can cause problems over time. There are two numbers in a blood pressure reading. There is a top number (systolic) over a bottom number (diastolic). It is best to have a blood pressure that is below 120/80. What are the causes? The cause of this condition is not known. Some other conditions can lead to high blood pressure. What increases the risk? Some lifestyle factors can make you more likely to develop high blood pressure: Smoking. Not getting enough exercise or physical activity. Being overweight. Having too much fat, sugar, calories, or salt (sodium) in your diet. Drinking too much alcohol. Other risk factors include: Having any of these conditions: Heart disease. Diabetes. High cholesterol. Kidney disease. Obstructive sleep apnea. Having a family history of high blood pressure and high cholesterol. Age. The risk increases with age. Stress. What are the signs or symptoms? High blood pressure may not cause symptoms. Very high blood pressure (hypertensive crisis) may cause: Headache. Fast or uneven heartbeats (palpitations). Shortness of breath. Nosebleed. Vomiting or feeling like you may vomit (nauseous). Changes in how you see. Very bad chest pain. Feeling dizzy. Seizures. How is this treated? This condition is treated by making healthy lifestyle changes, such as: Eating healthy foods. Exercising more. Drinking less alcohol. Your doctor may prescribe medicine if lifestyle changes do not help enough and if: Your top number is above 130. Your bottom number is above 80. Your personal target blood pressure may vary. Follow these instructions  at home: Eating and drinking  If told, follow the DASH eating plan. To follow this plan: Fill one half of your plate at each meal with fruits and vegetables. Fill one fourth of your plate at each meal with whole grains. Whole grains include whole-wheat pasta, brown rice, and whole-grain bread. Eat or drink low-fat dairy products, such as skim milk or low-fat yogurt. Fill one fourth of your plate at each meal with low-fat (lean) proteins. Low-fat proteins include fish, chicken without skin, eggs, beans, and tofu. Avoid fatty meat, cured and processed meat, or chicken with skin. Avoid pre-made or processed food. Limit the amount of salt in your diet to less than 1,500 mg each day. Do not drink alcohol if: Your doctor tells you not to drink. You are pregnant, may be pregnant, or are planning to become pregnant. If you drink alcohol: Limit how much you have to: 0-1 drink a day for women. 0-2 drinks a day for men. Know how much alcohol is in your drink. In the [place] one drink equals one 12 oz bottle of beer (355 mL), one 5 oz glass of wine (148 mL), or one 1 oz glass of hard liquor (44 mL). Lifestyle  Work with your doctor to stay at a healthy weight or to lose weight. Ask your doctor what the best weight is for you. Get at least 30 minutes of exercise that causes your heart to beat faster (aerobic exercise) most days of the week. This may include walking, swimming, or biking. Get at least 30 minutes of exercise that strengthens your muscles (resistance exercise) at least 3 days a week. This may include lifting weights or doing Pilates. Do not smoke  or use any products that contain nicotine or tobacco. If you need help quitting, ask your doctor. Check your blood pressure at home as told by your doctor. Keep all follow-up visits. Medicines Take over-the-counter and prescription medicines only as told by your doctor. Follow directions carefully. Do not skip doses of blood pressure medicine.  The medicine does not work as well if you skip doses. Skipping doses also puts you at risk for problems. Ask your doctor about side effects or reactions to medicines that you should watch for. Contact a doctor if: You think you are having a reaction to the medicine you are taking. You have headaches that keep coming back. You feel dizzy. You have swelling in your ankles. You have trouble with your vision. Get help right away if: You get a very bad headache. You start to feel mixed up (confused). You feel weak or numb. You feel faint. You have very bad pain in your: Chest. Belly (abdomen). You vomit more than once. You have trouble breathing. These symptoms may be an emergency. Get help right away. Call 911. Do not wait to see if the symptoms will go away. Do not drive yourself to the hospital. Summary Hypertension is another name for high blood pressure. High blood pressure forces your heart to work harder to pump blood. For most people, a normal blood pressure is less than 120/80. Making healthy choices can help lower blood pressure. If your blood pressure does not get lower with healthy choices, you may need to take medicine. This information is not intended to replace advice given to you by your health care provider. Make sure you discuss any questions you have with your health care provider. Document Revised: 04/20/2021 Document Reviewed: 04/20/2021 Elsevier Patient Education  [place].

## 2022-09-04 ENCOUNTER — Ambulatory Visit: Payer: BC Managed Care – PPO | Admitting: Internal Medicine

## 2022-09-04 LAB — BMP8+EGFR
BUN/Creatinine Ratio: 19 (ref 12–28)
BUN: 23 mg/dL (ref 8–27)
CO2: 26 mmol/L (ref 20–29)
Calcium: 10 mg/dL (ref 8.7–10.3)
Chloride: 94 mmol/L — ABNORMAL LOW (ref 96–106)
Creatinine, Ser: 1.24 mg/dL — ABNORMAL HIGH (ref 0.57–1.00)
Glucose: 194 mg/dL — ABNORMAL HIGH (ref 70–99)
Potassium: 4.7 mmol/L (ref 3.5–5.2)
Sodium: 138 mmol/L (ref 134–144)
eGFR: 49 mL/min/{1.73_m2} — ABNORMAL LOW (ref >59)

## 2022-09-04 LAB — HEPATIC FUNCTION PANEL
ALT: 31 IU/L (ref 0–32)
AST: 29 IU/L (ref 0–40)
Albumin: 4.3 g/dL (ref 3.9–4.9)
Alkaline Phosphatase: 66 IU/L (ref 44–121)
Bilirubin Total: <0.2 mg/dL (ref 0.0–1.2)
Bilirubin, Direct: <0.1 mg/dL (ref 0.00–0.40)
Total Protein: 7.8 g/dL (ref 6.0–8.5)

## 2022-09-04 LAB — HEMOGLOBIN A1C
Est. average glucose Bld gHb Est-mCnc: 217 mg/dL
Hgb A1c MFr Bld: 9.2 % — ABNORMAL HIGH (ref 4.8–5.6)

## 2022-10-09 ENCOUNTER — Other Ambulatory Visit: Payer: Self-pay

## 2022-10-09 MED ORDER — OZEMPIC (2 MG/DOSE) 8 MG/3ML ~~LOC~~ SOPN: 2.0000 mg | PEN_INJECTOR | SUBCUTANEOUS | 3 refills | Status: DC

## 2022-10-11 ENCOUNTER — Other Ambulatory Visit: Payer: Self-pay

## 2022-11-03 ENCOUNTER — Other Ambulatory Visit: Payer: Self-pay | Admitting: Internal Medicine

## 2022-11-03 DIAGNOSIS — I129 Hypertensive chronic kidney disease with stage 1 through stage 4 chronic kidney disease, or unspecified chronic kidney disease: Secondary | ICD-10-CM

## 2022-11-07 NOTE — Telephone Encounter (Signed)
error 

## 2022-12-05 ENCOUNTER — Encounter: Payer: Self-pay | Admitting: Internal Medicine

## 2022-12-05 ENCOUNTER — Ambulatory Visit: Payer: BC Managed Care – PPO | Admitting: Internal Medicine

## 2022-12-05 VITALS — BP 128/74 | HR 85 | Temp 98.5°F | Ht 64.0 in | Wt 200.8 lb

## 2022-12-05 DIAGNOSIS — N182 Chronic kidney disease, stage 2 (mild): Secondary | ICD-10-CM

## 2022-12-05 DIAGNOSIS — I129 Hypertensive chronic kidney disease with stage 1 through stage 4 chronic kidney disease, or unspecified chronic kidney disease: Secondary | ICD-10-CM

## 2022-12-05 DIAGNOSIS — G4733 Obstructive sleep apnea (adult) (pediatric): Secondary | ICD-10-CM

## 2022-12-05 DIAGNOSIS — E7800 Pure hypercholesterolemia, unspecified: Secondary | ICD-10-CM

## 2022-12-05 DIAGNOSIS — E6609 Other obesity due to excess calories: Secondary | ICD-10-CM

## 2022-12-05 DIAGNOSIS — E1122 Type 2 diabetes mellitus with diabetic chronic kidney disease: Secondary | ICD-10-CM | POA: Diagnosis not present

## 2022-12-05 DIAGNOSIS — F320 Major depressive disorder, single episode, mild: Secondary | ICD-10-CM

## 2022-12-05 DIAGNOSIS — Z6834 Body mass index (BMI) 34.0-34.9, adult: Secondary | ICD-10-CM

## 2022-12-05 NOTE — Progress Notes (Signed)
I,Brittany Rubio,acting as a scribe for Brittany Aliment, MD.,have documented all relevant documentation on the behalf of Brittany Aliment, MD,as directed by  Brittany Aliment, MD while in the presence of Brittany Aliment, MD.    Subjective:     Patient ID: Brittany Rubio , female    DOB: [birth year] , 62 y.o.   MRN: [id number]   Chief Complaint  Patient presents with   Diabetes   Hypertension   Hyperlipidemia    HPI  Pt presents today for DM/HTN check. She reports compliance with meds. Denies headaches, chest pain and shortness of breath. She states she has been doing better with meds, despite increased stress.   She adds, she is still experiencing trouble with sleeping. She has frequent awakenings throughout the night. She currently does not use CPAP machine.   She has upcoming appt for PAP smear on 12/31/22.    Diabetes She presents for her follow-up diabetic visit. She has type 2 diabetes mellitus. Her disease course has been stable. Pertinent negatives for hypoglycemia include no dizziness. Pertinent negatives for diabetes include no blurred vision, no chest pain, no polydipsia, no polyphagia and no polyuria. There are no hypoglycemic complications. Diabetic complications include nephropathy. Risk factors for coronary artery disease include diabetes mellitus, dyslipidemia, hypertension, obesity, sedentary lifestyle, post-menopausal and stress. Her weight is fluctuating minimally. She is following a diabetic diet.  Hypertension This is a chronic problem. The current episode started more than 1 year ago. The problem has been gradually improving since onset. The problem is uncontrolled. Pertinent negatives include no blurred vision, chest pain, palpitations or shortness of breath.  Hyperlipidemia This is a chronic problem. The current episode started more than 1 year ago. Exacerbating diseases include diabetes, hypothyroidism and obesity. Pertinent negatives include no chest pain or  shortness of breath. Current antihyperlipidemic treatment includes statins. The current treatment provides moderate improvement of lipids. Compliance problems include adherence to exercise.  Risk factors for coronary artery disease include diabetes mellitus, hypertension and post-menopausal.     Past Medical History:  Diagnosis Date   ADD (attention deficit disorder)    Anemia    Anxiety    Arthritis    "left shoulder" (07/07/2014)   Back pain    Breast cancer (HCC)    "left"   Depression    Fatty liver    Food allergy    shellfish   GERD (gastroesophageal reflux disease)    "takes over the counters as needed"   Hernia, umbilical    High cholesterol    Hypertension 11/26/2011   sees Dr. Velna Hatchet   Hypothyroidism    Incisional hernia without obstruction or gangrene 06/05/2018   Joint pain    Migraines    "maybe once q 3 months" (07/07/2014)   Multinodular thyroid 2015   OSA on CPAP    Personal history of radiation therapy    Pneumonia    May 2018   Sepsis due to Streptococcus pneumoniae (HCC)    Septic shock (HCC)    Severe sepsis (HCC) 06/05/2019   Thyroid cancer (HCC)    2015   Type II diabetes mellitus (HCC)    Vitamin D deficiency      Family History  Problem Relation Age of Onset   Cancer Mother        Pancreatic   Diabetes Mother    Hypertension Mother    Depression Mother    Hypertension Father    Alcoholism Father  Current Outpatient Medications:    amLODipine (NORVASC) 10 MG tablet, TAKE 1 TABLET BY MOUTH EVERY DAY, Disp: 90 tablet, Rfl: 1   amphetamine-dextroamphetamine (ADDERALL XR) 20 MG 24 hr capsule, Take 20 mg by mouth daily. 1 per day with the 10 mg and 30 mg xr, Disp: , Rfl:    amphetamine-dextroamphetamine (ADDERALL XR) 30 MG 24 hr capsule, Take 30 mg by mouth daily. Take 1 capsule with the 10 mg and the 20 mg xr, Disp: , Rfl:    amphetamine-dextroamphetamine (ADDERALL) 10 MG tablet, Take 10 mg by mouth daily with breakfast. Take 1  per day with the 20 mg xr and the 30 mg xr, Disp: , Rfl:    cholecalciferol (VITAMIN D3) 25 MCG (1000 UT) tablet, Take 1,000 Units by mouth daily., Disp: , Rfl:    Cyanocobalamin (VITAMIN B-12 PO), Take 1 tablet by mouth once a week. Tuesdays, Disp: , Rfl:    Dapagliflozin Pro-metFORMIN ER (XIGDUO XR) 11-998 MG TB24, Take 1 tablet by mouth in the morning and at bedtime. One tab po twice daily, Disp: 180 tablet, Rfl: 1   diazepam (VALIUM) 5 MG tablet, TAKE 1 TABLET BY MOUTH EVERY DAY AS NEEDED, Disp: 20 tablet, Rfl: 0   EPINEPHrine 0.3 mg/0.3 mL IJ SOAJ injection, Inject 0.3 mg into the muscle once as needed. Allergic reaction, Disp: 1 each, Rfl: 3   famotidine (PEPCID) 20 MG tablet, Take 20 mg by mouth as needed for heartburn or indigestion., Disp: , Rfl:    levocetirizine (XYZAL) 5 MG tablet, TAKE 1 TABLET BY MOUTH EVERY DAY IN THE EVENING, Disp: 90 tablet, Rfl: 1   Levothyroxine Sodium 175 MCG/ML SOLN, Take 175 mcg by mouth daily., Disp: 30 mL, Rfl: 5   lisinopril-hydrochlorothiazide (ZESTORETIC) 20-25 MG tablet, TAKE 1 TABLET BY MOUTH EVERY DAY, Disp: 90 tablet, Rfl: 1   Magnesium 500 MG CAPS, Take 500 mg by mouth daily., Disp: , Rfl:    pravastatin (PRAVACHOL) 80 MG tablet, TAKE ONE TAB MON-SUNDAY, Disp: 75 tablet, Rfl: 2   promethazine (PHENERGAN) 25 MG tablet, TAKE 1 TABLET BY MOUTH EVERY 8 HOURS AS NEEDED, Disp: 30 tablet, Rfl: 0   Rimegepant Sulfate (NURTEC) 75 MG TBDP, Take 75 mg by mouth daily as needed., Disp: 10 tablet, Rfl: 2   Semaglutide, 2 MG/DOSE, (OZEMPIC, 2 MG/DOSE,) 8 MG/3ML SOPN, Inject 2 mg into the skin once a week., Disp: 3 mL, Rfl: 3   TIADYLT ER 240 MG 24 hr capsule, TAKE 1 TABLET BY MOUTH AT BEDTIME FOR BLOOD PRESSURE, Disp: 90 capsule, Rfl: 1   valsartan-hydrochlorothiazide (DIOVAN HCT) 160-25 MG tablet, Take 1 tablet by mouth daily., Disp: 90 tablet, Rfl: 1   vitamin C (ASCORBIC ACID) 500 MG tablet, Take 500 mg by mouth daily., Disp: , Rfl:    vortioxetine HBr  (TRINTELLIX) 20 MG TABS tablet, Take 1 tablet (20 mg total) by mouth daily., Disp: 90 tablet, Rfl: 1   Allergies  Allergen Reactions   Crab [Shellfish Allergy] Hives and Swelling    "Only when I eat too many crab legs" Can tolerate CT scans with contrast-does not need premeds       Review of Systems  Constitutional: Negative.   Eyes:  Negative for blurred vision.  Respiratory: Negative.  Negative for shortness of breath.   Cardiovascular: Negative.  Negative for chest pain and palpitations.  Endocrine: Negative for polydipsia, polyphagia and polyuria.  Musculoskeletal: Negative.   Skin: Negative.   Neurological: Negative.  Negative for dizziness.  Psychiatric/Behavioral: Negative.       Today's Vitals   12/05/22 1542  BP: 128/74  Pulse: 85  Temp: 98.5 F (36.9 C)  Weight: 200 lb 12.8 oz (91.1 kg)  Height: 5\' 4"  (1.626 m)  PainSc: 0-No pain   Wt Readings from Last 3 Encounters:  12/05/22 200 lb 12.8 oz (91.1 kg)  09/03/22 205 lb 6.4 oz (93.2 kg)  06/14/22 217 lb (98.4 kg)    Body mass index is 34.47 kg/m.  The 10-year ASCVD risk score (Arnett DK, et al., 2019) is: 16.3%   Values used to calculate the score:     Age: 41 years     Sex: Female     Is Non-Hispanic African American: Yes     Diabetic: Yes     Tobacco smoker: No     Systolic Blood Pressure: 128 mmHg     Is BP treated: Yes     HDL Cholesterol: 50 mg/dL     Total Cholesterol: 167 mg/dL ++ Objective:  Physical Exam Vitals and nursing note reviewed.  Constitutional:      Appearance: Normal appearance.  HENT:     Head: Normocephalic and atraumatic.  Eyes:     Extraocular Movements: Extraocular movements intact.  Cardiovascular:     Rate and Rhythm: Normal rate and regular rhythm.     Heart sounds: Normal heart sounds.  Pulmonary:     Effort: Pulmonary effort is normal.     Breath sounds: Normal breath sounds.  Musculoskeletal:     Cervical back: Normal range of motion.  Skin:    General: Skin  is warm.  Neurological:     General: No focal deficit present.     Mental Status: She is alert.  Psychiatric:        Mood and Affect: Mood normal.        Behavior: Behavior normal.         Assessment And Plan:     1. Type 2 diabetes mellitus with stage 2 chronic kidney disease, without long-term current use of insulin (HCC) Comments: Chronic, I will check labs as below. She will c/w Xigduo 5/1000mg  twice daily and Ozempic 2mg  weekly.  She will f/u in 3 months. - BMP8+EGFR - Hemoglobin A1c  2. Hypertensive nephropathy Comments: Chronic, controlled. She will c/w amlodipine 10mg  and lisinopril/hct 20/25mg  daily. Advised to follow low sodium diet.  3. Pure hypercholesterolemia Comments: Chronic, she will c/w pravastatin daily. LDL goal < 70.  4. Major depressive disorder, single episode, mild (HCC) Comments: Chronic, sx controlled with Trintellix. Admits to being under more stress recently, both personally and professionally. Does not wish to change meds.  5. OSA (obstructive sleep apnea) Comments: No longer using CPAP, wants to be considered for [facility]. She agrees to sleep specialist evaluation. - Ambulatory referral to Neurology  6. Class 1 obesity due to excess calories with serious comorbidity and body mass index (BMI) of 34.0 to 34.9 in adult Comments: Chronic, she is encouraged to aim for at least 150 minutes of exercise per week, while striving for BMI<30 to decrease cardiac risk.  Return for 3 month bp & dm.  Patient was given opportunity to ask questions. Patient verbalized understanding of the plan and was able to repeat key elements of the plan. All questions were answered to their satisfaction.   I, Brittany Aliment, MD, have reviewed all documentation for this visit. The documentation on 12/05/22 for the exam, diagnosis, procedures, and orders are all accurate and complete.  IF YOU HAVE BEEN REFERRED TO A SPECIALIST, IT MAY TAKE 1-2 WEEKS TO SCHEDULE/PROCESS THE  REFERRAL. IF YOU HAVE NOT HEARD FROM US/SPECIALIST IN TWO WEEKS, PLEASE GIVE [place] A CALL AT [phone].   THE PATIENT IS ENCOURAGED TO PRACTICE SOCIAL DISTANCING DUE TO THE COVID-19 PANDEMIC.

## 2022-12-05 NOTE — Patient Instructions (Signed)

## 2022-12-06 ENCOUNTER — Other Ambulatory Visit: Payer: Self-pay | Admitting: Internal Medicine

## 2022-12-06 DIAGNOSIS — N1831 Chronic kidney disease, stage 3a: Secondary | ICD-10-CM

## 2022-12-06 LAB — BMP8+EGFR
BUN/Creatinine Ratio: 21 (ref 12–28)
BUN: 29 mg/dL — ABNORMAL HIGH (ref 8–27)
CO2: 27 mmol/L (ref 20–29)
Calcium: 9.9 mg/dL (ref 8.7–10.3)
Chloride: 99 mmol/L (ref 96–106)
Creatinine, Ser: 1.37 mg/dL — ABNORMAL HIGH (ref 0.57–1.00)
Glucose: 118 mg/dL — ABNORMAL HIGH (ref 70–99)
Potassium: 4.9 mmol/L (ref 3.5–5.2)
Sodium: 143 mmol/L (ref 134–144)
eGFR: 43 mL/min/{1.73_m2} — ABNORMAL LOW (ref >59)

## 2022-12-06 LAB — HEMOGLOBIN A1C
Est. average glucose Bld gHb Est-mCnc: 180 mg/dL
Hgb A1c MFr Bld: 7.9 % — ABNORMAL HIGH (ref 4.8–5.6)

## 2022-12-25 ENCOUNTER — Other Ambulatory Visit: Payer: Self-pay | Admitting: Internal Medicine

## 2023-01-09 ENCOUNTER — Other Ambulatory Visit: Payer: Self-pay | Admitting: Internal Medicine

## 2023-01-19 ENCOUNTER — Other Ambulatory Visit: Payer: Self-pay | Admitting: Internal Medicine

## 2023-01-19 DIAGNOSIS — F419 Anxiety disorder, unspecified: Secondary | ICD-10-CM

## 2023-01-21 ENCOUNTER — Other Ambulatory Visit: Payer: Self-pay | Admitting: Internal Medicine

## 2023-01-28 ENCOUNTER — Other Ambulatory Visit: Payer: Self-pay | Admitting: Internal Medicine

## 2023-01-28 DIAGNOSIS — Z1231 Encounter for screening mammogram for malignant neoplasm of breast: Secondary | ICD-10-CM

## 2023-01-30 ENCOUNTER — Encounter: Payer: Self-pay | Admitting: Neurology

## 2023-01-30 ENCOUNTER — Ambulatory Visit: Payer: BC Managed Care – PPO | Admitting: Neurology

## 2023-01-30 VITALS — BP 140/87 | HR 109 | Ht 64.0 in | Wt 200.2 lb

## 2023-01-30 DIAGNOSIS — G4734 Idiopathic sleep related nonobstructive alveolar hypoventilation: Secondary | ICD-10-CM

## 2023-01-30 DIAGNOSIS — R0602 Shortness of breath: Secondary | ICD-10-CM | POA: Diagnosis not present

## 2023-01-30 DIAGNOSIS — G4733 Obstructive sleep apnea (adult) (pediatric): Secondary | ICD-10-CM | POA: Insufficient documentation

## 2023-01-30 DIAGNOSIS — U099 Post covid-19 condition, unspecified: Secondary | ICD-10-CM

## 2023-01-30 DIAGNOSIS — G9332 Myalgic encephalomyelitis/chronic fatigue syndrome: Secondary | ICD-10-CM

## 2023-01-30 NOTE — Addendum Note (Signed)
Addended by: Melvyn Novas on: 01/30/2023 09:56 AM   Modules accepted: Orders

## 2023-01-30 NOTE — Progress Notes (Signed)
SLEEP MEDICINE CLINIC    Provider:  Melvyn Novas, MD  Primary Care Physician:  Dorothyann Peng, MD [address] [place] [address]     Referring Provider: Dorothyann Peng, Md [address] [place],  [place] [address]          Chief Complaint according to patient   Patient presents with:     New Patient (Initial Visit)           HISTORY OF PRESENT ILLNESS:  Brittany Rubio is a 62 y.o. female patient who is seen upon referral on 01/30/2023 from Dr Allyne Gee and is reportedly not using her CPAP  for several years, wants to get Inspire . The last sleep study was a CPAP titration in our office and it took place in July 2018, 6 years ago the patient was titrated from the beginning pressure of 5 cm water to a final pressure of 12 cmH2O she actually did best in terms of sleep efficiency at 11 cm water pressure she slept about 114 minutes over 2 hours under that pressure with a residual AHI of 0.4/h so this was an excellent resolution for the night of testing.  She remembers being given a FFM. She reportedly used CPAP for about 18-24 month and then had an ant infestation that also affected her CPAP machine and stopped using it.  Now here to see what's new on the market.  We had a lengthy discussion of BMI, abdominal girth, hypoxia and EM dependent or  central apneas not being treated with inspire.    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  Her sister has breathing difficulties.  She has in 2021 contracted COVID and had pneumonia twice.  She had MCI since. She has had thyroid cancer treated with radioactive treatment and surgery.    Bedtime is currently 11 . 30 Pm and she sleeps in intervals , 2 hours. She is a night time caretaker.  Rises at 5.15 AM.  She ends up sleeping only 4-5 hours.  She sleeps elevated for GERD and SOB, and she needs 3-4 pillows.  She sleeps  on her back.   Husband witnessed choking,  apnea and snoring.  Bedroom is cool, quiet and dark.   I would like to add that the patient and her polysomnography from July 2018 had an AHI of 52.1/h so this was severe sleep apnea her oxygen saturation was 76% at nadir and her time below 89% oxygen saturation was 72 minutes.  This is severe sleep apnea with severe hypoxia.  At the time she had endorsed the Epworth sleepiness score at 14 out of 25 points her BMI was just short of 40/min neck circumference measures 17 inches.  Then she was titrated her oxygen saturation was still low the nadir was 83% but the time spent below 89% saturation equaled 180 minutes these were 3 hours of low oxygen that only were corrected 1 CPAP reach 12 cm water.  I reviewed the current medications, her first compliance report from November 2018 showed a residual AHI of 3.1 which was excellent no central apneas were arising the machine was set to 12 cmH2O and in February 2022 she had used the machine 5 days and had an AHI of 2.1/h at the same settings.  So numerically 12 cm water pressure was of good result for her at the time.  Today's Epworth sleepiness  score has been endorsed at 5 out of 24 points so she is not excessively daytime sleepy but she takes several naps during the day power naps if she doesn't use Adderrall, Adderall supresses her sleepiness.  Fatigue severity scale was endorsed at 19 out of 63 points, geriatric depression score was endorsed at 2 out of 15 points which is not clinically indicative of depression.  And her current medications include Adderall XR 30 mg pill 20 mg pill and an 10 mg immediate release pill as needed.  This is a very high dose.  She is also taking Pepcid Xyzal Pravachol Phenergan as needed magnesium, Nurtec for migraines, vitamin C vitamin D3 she has a as needed prescription for Valium she is on diltiazem extended release Diovan 20 legs and has been started on Ozempic 2 mg injections.     Last Visit :  Brittany Rubio is an afro-american  62 y.o. female patient, RV on 08-07-2018. Since I had seen Brittany Rubio last she had a prolonged leave of absence from her job since mid November, she underwent a hernia surgery.  She return to work on the fifth and needs, with her short-term disability came also a less compliant use at the same time of her CPAP machine.  She states that she noted a significant air leak almost a whistle and contacted [facility] by email but she has still not seen of supply of new equipment holes, headgear or mask.  She used the machine last on 29 June 2018.  In the meantime there have been 5 weeks without CPAP use and I will write a note to [facility] today asking them to please send the supplies so that she can resume a more compliant use.   We discussed again that 4 hours of nightly use of the minimum use of time.  Her average daily usage on days used is 4 hours and 30 minutes, but she has an only 83% compliance by days and 60% compliance by time for October 2019.  The air leaks were indeed very high -between 35.9 L  and 29 L / minute.  She had very few residual apneas 2.7 and most of those are obstructive in origin.  There was no evidence of Cheyne-Stokes or central apnea.  The pressure is set at 12 cmH2O with 3 cm EPR.  Her device is auto titration capable.   FSS 31/ 63 points. Epworth sleepiness score 12 points- currently not using CPAP for 5-6 weeks.         She was seen here  12-13-2016, upon a referral from Dr. Allyne Gee for a sleep evaluation. Brittany Rubio is a patient of Dr. Lelon Perla that underwent 8 years ago  a PSG - sleep study. The place where her sleep testing took place is no longer existing, reportedly it was close to [employer], possible Dr. Leia Alf laboratory on [address]. She felt that the CPAP was cumbersome but helpful. She felt less sleepy and her sleep may have been deeper but the machine was not easy to use or set up. She is here today partially to see  if she still would need CPAP, is unclear what stage of apnea she may be in, and also to see if the different treatment options today that were not available in 2010.   Past medical history is positive for diabetic nephropathy, chronic kidney disease stage II, type 2 diabetes attention deficit disorder since childhood, hypertensive renal disease, allergic rhinitis, joint arthritis, superobesity and obstructive sleep apnea I  also reviewed briefly her list of medications. Mrs. Manuelito is postmenopausal, she does still have insomnia and her obstructive sleep apnea has been untreated for years. The patient had been diagnosed with sleep apnea and was given a CPAP machine, but she was unable to obtain supplies and has not used the machine in 4 or 5 years. She has been more daytime sleepy but usually can control her sleepiness either by starting to walk around or looking for other stimulation.   Review of Systems: Out of a complete 14 system review, the patient complains of only the following symptoms, and all other reviewed systems are negative.:    Caretaker duties at night,.   MCI   Post covid pneumonia and fatigue.   Fatigue, sleepiness , snoring, fragmented sleep, Insomnia, RLS, Nocturia    How likely are you to doze in the following situations: 0 = not likely, 1 = slight chance, 2 = moderate chance, 3 = high chance   Sitting and Reading? Watching Television? Sitting inactive in a public place (theater or meeting)? As a passenger in a car for an hour without a break? Lying down in the afternoon when circumstances permit? Sitting and talking to someone? Sitting quietly after lunch without alcohol? In a car, while stopped for a few minutes in traffic?   Total = 5/ 24 points  on adderall  FSS endorsed at 19/ 63 points.   Social History   Socioeconomic History   Marital status: Married    Spouse name: Brittany Rubio   Number of children: Not on file   Years of education: Not on file   Highest  education level: Master's degree (e.g., MA, MS, MEng, MEd, MSW, MBA)  Occupational History   Not on file  Tobacco Use   Smoking status: Never   Smokeless tobacco: Never  Vaping Use   Vaping status: Never Used  Substance and Sexual Activity   Alcohol use: Yes    Comment: 07/07/2014 "a few times/year; weddings, anniversary, etc"   Drug use: No   Sexual activity: Yes  Other Topics Concern   Not on file  Social History Narrative   Not on file   Social Determinants of Health   Financial Resource Strain: Low Risk  (12/01/2022)   Overall Financial Resource Strain (CARDIA)    Difficulty of Paying Living Expenses: Not hard at all  Food Insecurity: No Food Insecurity (12/01/2022)   Hunger Vital Sign    Worried About Running Out of Food in the Last Year: Never true    Ran Out of Food in the Last Year: Never true  Transportation Needs: No Transportation Needs (12/01/2022)   PRAPARE - Administrator, Civil Service (Medical): No    Lack of Transportation (Non-Medical): No  Physical Activity: Insufficiently Active (12/01/2022)   Exercise Vital Sign    Days of Exercise per Week: 1 day    Minutes of Exercise per Session: 20 min  Stress: No Stress Concern Present (12/01/2022)   [employer] of Occupational Health - Occupational Stress Questionnaire    Feeling of Stress : Only a little  Social Connections: Socially Integrated (12/01/2022)   Social Connection and Isolation Panel [NHANES]    Frequency of Communication with Friends and Family: More than three times a week    Frequency of Social Gatherings with Friends and Family: Twice a week    Attends Religious Services: More than 4 times per year    Active Member of Clubs or Organizations: Yes    Attends  Club or Armed forces operational officer: More than 4 times per year    Marital Status: Married    Family History  Problem Relation Age of Onset   Cancer Mother        Pancreatic   Diabetes Mother    Hypertension Mother     Depression Mother    Hypertension Father    Alcoholism Father     Past Medical History:  Diagnosis Date   ADD (attention deficit disorder)    Anemia    Anxiety    Arthritis    "left shoulder" (07/07/2014)   Back pain    Breast cancer (HCC)    "left"   Depression    Fatty liver    Food allergy    shellfish   GERD (gastroesophageal reflux disease)    "takes over the counters as needed"   Hernia, umbilical    High cholesterol    Hypertension 11/26/2011   sees Dr. Velna Hatchet   Hypothyroidism    Incisional hernia without obstruction or gangrene 06/05/2018   Joint pain    Migraines    "maybe once q 3 months" (07/07/2014)   Multinodular thyroid 2015   OSA on CPAP    Personal history of radiation therapy    Pneumonia    May 2018   Sepsis due to Streptococcus pneumoniae (HCC)    Septic shock (HCC)    Severe sepsis (HCC) 06/05/2019   Thyroid cancer (HCC)    2015   Type II diabetes mellitus (HCC)    Vitamin D deficiency     Past Surgical History:  Procedure Laterality Date   ABDOMINAL HYSTERECTOMY  ? 1997   BREAST EXCISIONAL BIOPSY Right 2014   BREAST LUMPECTOMY Left 2009   BREAST REDUCTION SURGERY Bilateral    DIAPHRAGM SURGERY  1986   "trauma"   INCISIONAL HERNIA REPAIR N/A 06/05/2018   Procedure: LAPAROSCOPIC INCISIONAL HERNIA ERAS PATHWAY;  Surgeon: Harriette Bouillon, MD;  Location: [facility] OR;  Service: General;  Laterality: N/A;   INSERTION OF MESH N/A 06/05/2018   Procedure: INSERTION OF MESH;  Surgeon: Harriette Bouillon, MD;  Location: [facility] OR;  Service: General;  Laterality: N/A;   PARTIAL MASTECTOMY WITH NEEDLE LOCALIZATION  08/12/2012   Procedure: PARTIAL MASTECTOMY WITH NEEDLE LOCALIZATION;  Surgeon: Clovis Pu. Cornett, MD;  Location: [facility] OR;  Service: General;  Laterality: Right;   REDUCTION MAMMAPLASTY Bilateral 1995   THYROIDECTOMY Left 07/07/2014   Procedure: LEFT HEMI-THYROIDECTOMY;  Surgeon: Darletta Moll, MD;  Location: [facility] OR;  Service: ENT;  Laterality: Left;    THYROIDECTOMY Right 07/08/2014   Procedure: Completion THYROIDECTOMY;  Surgeon: Darletta Moll, MD;  [facility] OR;  Service: ENT;  Laterality: Right;   THYROIDECTOMY, PARTIAL Left 07/07/2014   hemi     Current Outpatient Medications on File Prior to Visit  Medication Sig Dispense Refill   amphetamine-dextroamphetamine (ADDERALL XR) 20 MG 24 hr capsule Take 20 mg by mouth daily. 1 per day with the 10 mg and 30 mg xr     amphetamine-dextroamphetamine (ADDERALL XR) 30 MG 24 hr capsule Take 30 mg by mouth daily. Take 1 capsule with the 10 mg and the 20 mg xr     amphetamine-dextroamphetamine (ADDERALL) 10 MG tablet Take 10 mg by mouth daily with breakfast. Take 1 per day with the 20 mg xr and the 30 mg xr     cholecalciferol (VITAMIN D3) 25 MCG (1000 UT) tablet Take 1,000 Units by mouth daily.     Cyanocobalamin (VITAMIN B-12  PO) Take 1 tablet by mouth once a week. Tuesdays     Dapagliflozin Pro-metFORMIN ER (XIGDUO XR) 11-998 MG TB24 Take 1 tablet by mouth in the morning and at bedtime. One tab po twice daily 180 tablet 1   diazepam (VALIUM) 5 MG tablet TAKE 1 TABLET BY MOUTH EVERY DAY AS NEEDED 20 tablet 0   EPINEPHrine 0.3 mg/0.3 mL IJ SOAJ injection Inject 0.3 mg into the muscle once as needed. Allergic reaction 1 each 3   famotidine (PEPCID) 20 MG tablet Take 20 mg by mouth as needed for heartburn or indigestion.     levocetirizine (XYZAL) 5 MG tablet TAKE 1 TABLET BY MOUTH EVERY DAY IN THE EVENING 90 tablet 1   Levothyroxine Sodium 175 MCG/ML SOLN Take 175 mcg by mouth daily. 30 mL 5   lisinopril-hydrochlorothiazide (ZESTORETIC) 20-25 MG tablet TAKE 1 TABLET BY MOUTH EVERY DAY 90 tablet 1   Magnesium 500 MG CAPS Take 500 mg by mouth daily.     pravastatin (PRAVACHOL) 80 MG tablet TAKE ONE TAB MON-SUNDAY 75 tablet 2   promethazine (PHENERGAN) 25 MG tablet TAKE 1 TABLET BY MOUTH EVERY 8 HOURS AS NEEDED 30 tablet 0   Rimegepant Sulfate (NURTEC) 75 MG TBDP Take 75 mg by mouth daily as needed. 10  tablet 2   Semaglutide, 2 MG/DOSE, (OZEMPIC, 2 MG/DOSE,) 8 MG/3ML SOPN Inject 2 mg into the skin once a week. 3 mL 3   TIADYLT ER 240 MG 24 hr capsule TAKE 1 TABLET BY MOUTH AT BEDTIME FOR BLOOD PRESSURE 90 capsule 1   valsartan-hydrochlorothiazide (DIOVAN-HCT) 160-25 MG tablet TAKE 1 TABLET BY MOUTH EVERY DAY 90 tablet 1   vitamin C (ASCORBIC ACID) 500 MG tablet Take 500 mg by mouth daily.     vortioxetine HBr (TRINTELLIX) 20 MG TABS tablet Take 1 tablet (20 mg total) by mouth daily. 90 tablet 1   amLODipine (NORVASC) 10 MG tablet TAKE 1 TABLET BY MOUTH EVERY DAY (Patient not taking: Reported on 01/30/2023) 90 tablet 1   No current facility-administered medications on file prior to visit.    Allergies  Allergen Reactions   Crab [Shellfish Allergy] Hives and Swelling    "Only when I eat too many crab legs" Can tolerate CT scans with contrast-does not need premeds       DIAGNOSTIC DATA (LABS, IMAGING, TESTING) - I reviewed patient records, labs, notes, testing and imaging myself where available.  Lab Results  Component Value Date   WBC 8.5 02/19/2022   HGB 12.3 02/19/2022   HCT 40.1 02/19/2022   MCV 72 (L) 02/19/2022   PLT 368 02/19/2022      Component Value Date/Time   NA 143 12/05/2022 1645   NA 143 05/28/2013 0844   K 4.9 12/05/2022 1645   K 4.4 05/28/2013 0844   CL 99 12/05/2022 1645   CL 99 11/20/2012 0940   CO2 27 12/05/2022 1645   CO2 28 05/28/2013 0844   GLUCOSE 118 (H) 12/05/2022 1645   GLUCOSE 94 06/15/2019 0234   GLUCOSE 94 05/28/2013 0844   GLUCOSE 142 (H) 11/20/2012 0940   BUN 29 (H) 12/05/2022 1645   BUN 16.9 05/28/2013 0844   CREATININE 1.37 (H) 12/05/2022 1645   CREATININE 0.8 05/28/2013 0844   CALCIUM 9.9 12/05/2022 1645   CALCIUM 9.8 05/28/2013 0844   PROT 7.8 09/03/2022 1644   PROT 7.9 05/28/2013 0844   ALBUMIN 4.3 09/03/2022 1644   ALBUMIN 3.7 05/28/2013 0844   AST 29 09/03/2022 1644  AST 15 05/28/2013 0844   ALT 31 09/03/2022 1644   ALT 23  05/28/2013 0844   ALKPHOS 66 09/03/2022 1644   ALKPHOS 58 05/28/2013 0844   BILITOT <0.2 09/03/2022 1644   BILITOT 0.28 05/28/2013 0844   GFRNONAA 71 08/10/2020 1457   GFRAA 81 08/10/2020 1457   Lab Results  Component Value Date   CHOL 167 02/19/2022   HDL 50 02/19/2022   LDLCALC 87 02/19/2022   TRIG 174 (H) 02/19/2022   CHOLHDL 3.3 02/19/2022   Lab Results  Component Value Date   HGBA1C 7.9 (H) 12/05/2022   Lab Results  Component Value Date   VITAMINB12 1,588 (H) 08/10/2020   Lab Results  Component Value Date   TSH 0.056 (L) 08/14/2021    PHYSICAL EXAM:  Today's Vitals   01/30/23 0902  BP: (!) 140/87  Pulse: (!) 109  Weight: 200 lb 3.2 oz (90.8 kg)  Height: 5\' 4"  (1.626 m)   Body mass index is 34.36 kg/m.   Wt Readings from Last 3 Encounters:  01/30/23 200 lb 3.2 oz (90.8 kg)  12/05/22 200 lb 12.8 oz (91.1 kg)  09/03/22 205 lb 6.4 oz (93.2 kg)     Ht Readings from Last 3 Encounters:  01/30/23 5\' 4"  (1.626 m)  12/05/22 5\' 4"  (1.626 m)  09/03/22 5\' 4"  (1.626 m)      General: The patient is awake, alert and appears not in acute distress. The patient is well groomed. Head: Normocephalic, atraumatic. Neck is supple. Mallampati 3 plus  neck circumference:16. Nasal airflow patent .  Retrognathia is seen.  Cardiovascular:  Regular rate and rhythm- without  murmurs or carotid bruit, and without distended neck veins. Respiratory: Lungs are clear to auscultation. Skin:  Without evidence of edema, or rash Trunk: abdominal weight.    Neurologic exam : The patient is awake and alert, oriented to place and time.   Attention span & concentration ability appears normal. She has problems multitasking  Speech is fluent,  without dysarthria, but with dysphonia . Mood and affect are appropriate.   Cranial nerves: Pupils are equal and briskly reactive to light.  Facial sensation intact to fine touch.  Facial motor strength is symmetric and tongue in midline. Shoulder  shrug was symmetrical.  Deep tendon reflexes: in the upper and lower extremities are symmetric and intact. Babinski maneuver response is downgoing.      ASSESSMENT AND PLAN 62 y.o. year- old AA female  OSA patient here with:    1) untreated sleep apnea , complex with severe hypoxemia in 2018, needs retesting and re-titration.  Avoiding FFM for patient's comfort.   2) Obesity, but in the process of losing weight, BMI 34.3 -  pneumonia, post covid fatigue and "mind remains foggy".  3) not a candidate for Inspire by her 2018 data ! Exclusion by BMI, and by hypoxia.   I ordered a HST for baseline and autotitration to follow. ON on CPAP>   I plan to follow up either personally or through our NP within 3-4 months.   I would like to thank Dorothyann Peng, MD and Dorothyann Peng, Md [address] [place],  [place] [address] for allowing me to meet with and to take care of this pleasant patient.     After spending a total time of  45  minutes face to face and additional time for physical and neurologic examination, review of laboratory studies,  personal review of imaging studies, reports and results of other testing  and review of referral information / records as far as provided in visit,   Electronically signed by: Melvyn Novas, MD 01/30/2023 9:10 AM  [facility] Board certified by The [employer] of Sleep Medicine and Diplomate of the [employer] of Sleep Medicine. Board certified In Neurology through the ABPN, Fellow of the [employer] of Neu[employer].

## 2023-01-30 NOTE — Patient Instructions (Signed)
ASSESSMENT AND PLAN 62 y.o. year- old AA female  OSA patient here with:     1) untreated sleep apnea , complex with severe hypoxemia in 2018, needs retesting and re-titration.  Avoiding FFM for patient's comfort.    2) Obesity, but in the process of losing weight, BMI 34.3 -  pneumonia, post covid fatigue and "mind remains foggy".   3) not a candidate for Inspire by her 2018 data ! Exclusion by BMI, and by hypoxia.    I ordered a HST for baseline and autotitration to follow. ON on CPAP>    I plan to follow up either personally or through our NP within 3-4 months.

## 2023-02-01 ENCOUNTER — Other Ambulatory Visit: Payer: Self-pay | Admitting: Internal Medicine

## 2023-02-01 DIAGNOSIS — C50919 Malignant neoplasm of unspecified site of unspecified female breast: Secondary | ICD-10-CM

## 2023-02-01 LAB — HM COLONOSCOPY

## 2023-02-03 ENCOUNTER — Other Ambulatory Visit: Payer: Self-pay | Admitting: Internal Medicine

## 2023-02-07 ENCOUNTER — Other Ambulatory Visit: Payer: Self-pay | Admitting: Internal Medicine

## 2023-02-20 ENCOUNTER — Telehealth: Payer: Self-pay

## 2023-02-20 NOTE — Telephone Encounter (Signed)
Pt was scheduled to have a mailout HST device sent to her today (02/20/23) but yesterday the appointment was cancelled through the automated reminder/system. Tried calling pt yesterday and did not get an answer, left a voicemail for patient to please call before EOD to let us know if she no longer wanted the device mailed out or if it was cancelled by mistake. As of this AM (02/20/23) no return call or message has been left from the patient. No device will be mailed out and pt must reschedule time for HST.

## 2023-02-22 ENCOUNTER — Ambulatory Visit (INDEPENDENT_AMBULATORY_CARE_PROVIDER_SITE_OTHER): Payer: BC Managed Care – PPO | Admitting: Internal Medicine

## 2023-02-22 ENCOUNTER — Encounter: Payer: Self-pay | Admitting: Internal Medicine

## 2023-02-22 VITALS — BP 128/78 | HR 75 | Temp 98.2°F | Wt 198.2 lb

## 2023-02-22 DIAGNOSIS — E7800 Pure hypercholesterolemia, unspecified: Secondary | ICD-10-CM | POA: Diagnosis not present

## 2023-02-22 DIAGNOSIS — I129 Hypertensive chronic kidney disease with stage 1 through stage 4 chronic kidney disease, or unspecified chronic kidney disease: Secondary | ICD-10-CM | POA: Diagnosis not present

## 2023-02-22 DIAGNOSIS — Z0000 Encounter for general adult medical examination without abnormal findings: Secondary | ICD-10-CM

## 2023-02-22 DIAGNOSIS — Z85850 Personal history of malignant neoplasm of thyroid: Secondary | ICD-10-CM

## 2023-02-22 DIAGNOSIS — R252 Cramp and spasm: Secondary | ICD-10-CM

## 2023-02-22 DIAGNOSIS — E6609 Other obesity due to excess calories: Secondary | ICD-10-CM

## 2023-02-22 DIAGNOSIS — Z6834 Body mass index (BMI) 34.0-34.9, adult: Secondary | ICD-10-CM

## 2023-02-22 DIAGNOSIS — E1122 Type 2 diabetes mellitus with diabetic chronic kidney disease: Secondary | ICD-10-CM | POA: Diagnosis not present

## 2023-02-22 DIAGNOSIS — N182 Chronic kidney disease, stage 2 (mild): Secondary | ICD-10-CM | POA: Diagnosis not present

## 2023-02-22 DIAGNOSIS — G43009 Migraine without aura, not intractable, without status migrainosus: Secondary | ICD-10-CM

## 2023-02-22 DIAGNOSIS — F5104 Psychophysiologic insomnia: Secondary | ICD-10-CM

## 2023-02-22 MED ORDER — NURTEC 75 MG PO TBDP: 75.0000 mg | ORAL_TABLET | Freq: Every day | ORAL | 2 refills | Status: DC | PRN

## 2023-02-22 NOTE — Patient Instructions (Signed)

## 2023-02-22 NOTE — Progress Notes (Signed)
I,Brittany Rubio, CMA,acting as a Neurosurgeon for Brittany Aliment, MD.,have documented all relevant documentation on the behalf of Brittany Aliment, MD,as directed by  Brittany Aliment, MD while in the presence of Brittany Aliment, MD.  Subjective:    Patient ID: Brittany Rubio , female    DOB: Dec 07, 1960 , 62 y.o.   MRN: [id number]  Chief Complaint  Patient presents with   Annual Exam   Diabetes   Hypertension    HPI  She is here today for a full physical examination.  She is followed by Henreitta Leber, PA for her GYN exams. She reports compliance with meds. She states her sugars are "pretty good'.   Letter sent for pap & colonoscopy. She is scheduled to complete pap first week of Sept.   She reports also wanting a refill for Nurtec.     Diabetes She presents for her follow-up diabetic visit. She has type 2 diabetes mellitus. Her disease course has been stable. Headaches: she has h/o migraines. needs refill of Stadol NS. Pertinent negatives for diabetes include no blurred vision. There are no hypoglycemic complications. Diabetic complications include nephropathy. Risk factors for coronary artery disease include diabetes mellitus, dyslipidemia, hypertension, obesity, sedentary lifestyle, post-menopausal and stress. Her weight is fluctuating minimally. She is following a diabetic diet. She participates in exercise intermittently. Her home blood glucose trend is increasing steadily. Her breakfast blood glucose is taken between 8-9 am. Her breakfast blood glucose range is generally 140-180 mg/dl. An ACE inhibitor/angiotensin II receptor blocker is being taken. Eye exam is current.  Hypertension This is a chronic problem. The current episode started more than 1 year ago. The problem has been gradually improving since onset. The problem is uncontrolled. Pertinent negatives include no blurred vision. Headaches: she has h/o migraines. needs refill of Stadol NS.The current treatment provides moderate  improvement. Compliance problems include exercise.  Hypertensive end-organ damage includes kidney disease.     Past Medical History:  Diagnosis Date   ADD (attention deficit disorder)    Anemia    Anxiety    Arthritis    "left shoulder" (07/07/2014)   Back pain    Breast cancer (HCC)    "left"   Depression    Fatty liver    Food allergy    shellfish   GERD (gastroesophageal reflux disease)    "takes over the counters as needed"   Hernia, umbilical    High cholesterol    Hypertension 11/26/2011   sees Dr. Velna Hatchet   Hypothyroidism    Incisional hernia without obstruction or gangrene 06/05/2018   Joint pain    Migraines    "maybe once q 3 months" (07/07/2014)   Multinodular thyroid 2015   OSA on CPAP    Personal history of radiation therapy    Pneumonia    May 2018   Sepsis due to Streptococcus pneumoniae (HCC)    Septic shock (HCC)    Severe sepsis (HCC) 06/05/2019   Thyroid cancer (HCC)    2015   Type II diabetes mellitus (HCC)    Vitamin D deficiency      Family History  Problem Relation Age of Onset   Cancer Mother        Pancreatic   Diabetes Mother    Hypertension Mother    Depression Mother    Hypertension Father    Alcoholism Father      Current Outpatient Medications:    amphetamine-dextroamphetamine (ADDERALL XR) 20 MG 24 hr capsule, Take  20 mg by mouth daily. 1 per day with the 10 mg and 30 mg xr, Disp: , Rfl:    amphetamine-dextroamphetamine (ADDERALL XR) 30 MG 24 hr capsule, Take 30 mg by mouth daily. Take 1 capsule with the 10 mg and the 20 mg xr, Disp: , Rfl:    amphetamine-dextroamphetamine (ADDERALL) 10 MG tablet, Take 10 mg by mouth daily with breakfast. Take 1 per day with the 20 mg xr and the 30 mg xr, Disp: , Rfl:    cholecalciferol (VITAMIN D3) 25 MCG (1000 UT) tablet, Take 1,000 Units by mouth daily., Disp: , Rfl:    Cyanocobalamin (VITAMIN B-12 PO), Take 1 tablet by mouth once a week. Tuesdays, Disp: , Rfl:    diazepam (VALIUM) 5  MG tablet, TAKE 1 TABLET BY MOUTH EVERY DAY AS NEEDED, Disp: 20 tablet, Rfl: 0   EPINEPHrine 0.3 mg/0.3 mL IJ SOAJ injection, Inject 0.3 mg into the muscle once as needed. Allergic reaction, Disp: 1 each, Rfl: 3   famotidine (PEPCID) 20 MG tablet, Take 20 mg by mouth as needed for heartburn or indigestion., Disp: , Rfl:    levocetirizine (XYZAL) 5 MG tablet, TAKE 1 TABLET BY MOUTH EVERY DAY IN THE EVENING, Disp: 90 tablet, Rfl: 1   Levothyroxine Sodium 175 MCG/ML SOLN, Take 175 mcg by mouth daily., Disp: 30 mL, Rfl: 5   Magnesium 500 MG CAPS, Take 500 mg by mouth daily., Disp: , Rfl:    pravastatin (PRAVACHOL) 80 MG tablet, TAKE ONE TABLET BY MOUTH MON-SUNDAY, Disp: 75 tablet, Rfl: 2   promethazine (PHENERGAN) 25 MG tablet, TAKE 1 TABLET BY MOUTH EVERY 8 HOURS AS NEEDED, Disp: 30 tablet, Rfl: 0   ramelteon (ROZEREM) 8 MG tablet, Take 1 tablet (8 mg total) by mouth at bedtime as needed for sleep., Disp: 30 tablet, Rfl: 1   TIADYLT ER 240 MG 24 hr capsule, TAKE 1 TABLET BY MOUTH AT BEDTIME FOR BLOOD PRESSURE, Disp: 90 capsule, Rfl: 1   valsartan-hydrochlorothiazide (DIOVAN-HCT) 160-25 MG tablet, TAKE 1 TABLET BY MOUTH EVERY DAY, Disp: 90 tablet, Rfl: 1   vitamin C (ASCORBIC ACID) 500 MG tablet, Take 500 mg by mouth daily., Disp: , Rfl:    vortioxetine HBr (TRINTELLIX) 20 MG TABS tablet, Take 1 tablet (20 mg total) by mouth daily., Disp: 90 tablet, Rfl: 1   XIGDUO XR 11-998 MG TB24, TAKE 1 TABLET BY MOUTH EVERY DAY, Disp: 30 tablet, Rfl: 1   Rimegepant Sulfate (NURTEC) 75 MG TBDP, Take 1 tablet (75 mg total) by mouth daily as needed., Disp: 10 tablet, Rfl: 2   Semaglutide, 2 MG/DOSE, (OZEMPIC, 2 MG/DOSE,) 8 MG/3ML SOPN, INJECT 2 MG INTO THE SKIN ONCE A WEEK., Disp: 3 mL, Rfl: 3   Allergies  Allergen Reactions   Crab [Shellfish Allergy] Hives and Swelling    "Only when I eat too many crab legs" Can tolerate CT scans with contrast-does not need premeds        The patient states she uses post  menopausal status for birth control. No LMP recorded. Patient has had a hysterectomy.. Negative for Dysmenorrhea. Negative for: breast discharge, breast lump(s), breast pain and breast self exam. Associated symptoms include abnormal vaginal bleeding. Pertinent negatives include abnormal bleeding (hematology), anxiety, decreased libido, depression, difficulty falling sleep, dyspareunia, history of infertility, nocturia, sexual dysfunction, sleep disturbances, urinary incontinence, urinary urgency, vaginal discharge and vaginal itching. Diet regular.The patient states her exercise level is    . The patient's tobacco use is:  Social  History   Tobacco Use  Smoking Status Never  Smokeless Tobacco Never  . She has been exposed to passive smoke. The patient's alcohol use is:  Social History   Substance and Sexual Activity  Alcohol Use Yes   Comment: 07/07/2014 "a few times/year; weddings, anniversary, etc"   Review of Systems  Constitutional: Negative.   HENT: Negative.    Eyes: Negative.  Negative for blurred vision.  Respiratory: Negative.    Cardiovascular: Negative.   Gastrointestinal: Negative.   Endocrine: Negative.   Genitourinary: Negative.   Musculoskeletal:  Positive for myalgias.  Skin: Negative.   Allergic/Immunologic: Negative.   Neurological: Negative.  Headaches: she has h/o migraines. needs refill of Stadol NS.  Hematological: Negative.   Psychiatric/Behavioral:  Positive for sleep disturbance.      Today's Vitals   02/22/23 1416  BP: 128/78  Pulse: 75  Temp: 98.2 F (36.8 C)  SpO2: 98%  Weight: 198 lb 3.2 oz (89.9 kg)   Body mass index is 34.02 kg/m.  Wt Readings from Last 3 Encounters:  02/22/23 198 lb 3.2 oz (89.9 kg)  01/30/23 200 lb 3.2 oz (90.8 kg)  12/05/22 200 lb 12.8 oz (91.1 kg)     Objective:  Physical Exam Vitals and nursing note reviewed.  Constitutional:      Appearance: Normal appearance. She is obese.  HENT:     Head: Normocephalic and  atraumatic.     Right Ear: Tympanic membrane, ear canal and external ear normal.     Left Ear: Tympanic membrane, ear canal and external ear normal.     Nose: Nose normal.     Mouth/Throat:     Mouth: Mucous membranes are moist.     Pharynx: Oropharynx is clear.  Eyes:     Extraocular Movements: Extraocular movements intact.     Conjunctiva/sclera: Conjunctivae normal.     Pupils: Pupils are equal, round, and reactive to light.  Cardiovascular:     Rate and Rhythm: Normal rate and regular rhythm.     Pulses: Normal pulses.          Dorsalis pedis pulses are 2+ on the right side and 2+ on the left side.     Heart sounds: Normal heart sounds.  Pulmonary:     Effort: Pulmonary effort is normal.     Breath sounds: Normal breath sounds.  Chest:  Breasts:    Tanner Score is 5.     Comments: Surgical scars b/l breasts Abdominal:     General: Bowel sounds are normal.     Palpations: Abdomen is soft.     Comments: Midline healed surgical scar, obese, soft. Difficult to assess organomegaly.  Genitourinary:    Comments: deferred Musculoskeletal:        General: Normal range of motion.     Cervical back: Normal range of motion and neck supple.  Feet:     Right foot:     Protective Sensation: 5 sites tested.  5 sites sensed.     Skin integrity: Dry skin present.     Toenail Condition: Right toenails are normal.     Left foot:     Protective Sensation: 5 sites tested.  5 sites sensed.     Skin integrity: Dry skin present.     Toenail Condition: Left toenails are normal.  Skin:    General: Skin is warm and dry.  Neurological:     General: No focal deficit present.     Mental Status: She is alert and  oriented to person, place, and time.  Psychiatric:        Mood and Affect: Mood normal.        Behavior: Behavior normal.         Assessment And Plan:     Encounter for general adult medical examination w/o abnormal findings Assessment & Plan: A full exam was performed.   Importance of monthly self breast exams was discussed with the patient.  She is advised to get 3045 minutes of regular exercise, no less than four to five days per week. Both weight-bearing and aerobic exercises are recommended.  She is advised to follow a healthy diet with at least six fruits/veggies per day, decrease intake of red meat and other saturated fats and to increase fish intake to twice weekly.  Meats/fish should not be fried -- baked, boiled or broiled is preferable. It is also important to cut back on your sugar intake.  Be sure to read labels - try to avoid anything with added sugar, high fructose corn syrup or other sweeteners.  If you must use a sweetener, you can try stevia or monkfruit.  It is also important to avoid artificially sweetened foods/beverages and diet drinks. Lastly, wear SPF 50 sunscreen on exposed skin and when in direct sunlight for an extended period of time.  Be sure to avoid fast food restaurants and aim for at least 60 ounces of water daily.      Orders: -     CBC -     Lipid panel -     Hemoglobin A1c -     CMP14+EGFR  Type 2 diabetes mellitus with stage 2 chronic kidney disease, without long-term current use of insulin (HCC) Assessment & Plan: Chronic, diabetic foot exam was performed. She will f/u in 3-4 months. No med changes today, will consider after reviewing labs.  I DISCUSSED WITH THE PATIENT AT LENGTH REGARDING THE GOALS OF GLYCEMIC CONTROL AND POSSIBLE LONG-TERM COMPLICATIONS.  I  ALSO STRESSED THE IMPORTANCE OF COMPLIANCE WITH HOME GLUCOSE MONITORING, DIETARY RESTRICTIONS INCLUDING AVOIDANCE OF SUGARY DRINKS/PROCESSED FOODS,  ALONG WITH REGULAR EXERCISE.  I  ALSO STRESSED THE IMPORTANCE OF ANNUAL EYE EXAMS, SELF FOOT CARE AND COMPLIANCE WITH OFFICE VISITS.   Orders: -     Microalbumin / creatinine urine ratio  Hypertensive nephropathy Assessment & Plan: Chronic, fair control. Goal BP<120/80.  She will continue with valsartan160/25mg  daily and  Tiadylt ER 240mg  daily. EKG performed, NSR w/o acute changes. She will rto in four months for re-evaluation. Encouraged to follow low sodium diet.   Orders: -     EKG 12-Lead  Pure hypercholesterolemia Assessment & Plan: Chronic, LDL goal < 70.  She will continue with pravastatin 80mg  daily. She is encouraged to follow heart healthy lifestyle and Mediterranean diet.    Migraine without aura and without status migrainosus, not intractable Assessment & Plan: Chronic, she is encouraged to avoid known triggers. I will send refill of Nurtec to use daily prn. If sx worsen, will consider Neuro evaluation.    Chronic insomnia Assessment & Plan: Chronic, encouraged to adopt a bedtime routine. I will send rx Rozerem to use nightly.    Leg cramps Assessment & Plan: She is encouraged to stay well hydrated. I will check Mg, calcium, potassium levels along with CPK. May benefit from  CoQ10 supplementation since she is on statin therapy.   Orders: -     Magnesium -     CK  Class 1 obesity due to excess calories  with serious comorbidity and body mass index (BMI) of 34.0 to 34.9 in adult Assessment & Plan: She is encouraged to strive for BMI less than 30 to decrease cardiac risk. Advised to aim for at least 150 minutes of exercise per week.    History of thyroid cancer -     TSH + free T4 -     Thyroglobulin antibody  Other orders -     Nurtec; Take 1 tablet (75 mg total) by mouth daily as needed.  Dispense: 10 tablet; Refill: 2 -     Ramelteon; Take 1 tablet (8 mg total) by mouth at bedtime as needed for sleep.  Dispense: 30 tablet; Refill: 1  She is encouraged to strive for BMI less than 30 to decrease cardiac risk. Advised to aim for at least 150 minutes of exercise per week.    Return for 1 year physical, 3 month dm check. Patient was given opportunity to ask questions. Patient verbalized understanding of the plan and was able to repeat key elements of the plan. All questions were  answered to their satisfaction.   I, Brittany Aliment, MD, have reviewed all documentation for this visit. The documentation on 02/22/23 for the exam, diagnosis, procedures, and orders are all accurate and complete.

## 2023-02-26 ENCOUNTER — Encounter: Payer: BC Managed Care – PPO | Admitting: Internal Medicine

## 2023-02-27 ENCOUNTER — Ambulatory Visit (INDEPENDENT_AMBULATORY_CARE_PROVIDER_SITE_OTHER): Payer: BC Managed Care – PPO | Admitting: Neurology

## 2023-02-27 ENCOUNTER — Other Ambulatory Visit: Payer: Self-pay | Admitting: Internal Medicine

## 2023-02-27 DIAGNOSIS — U099 Post covid-19 condition, unspecified: Secondary | ICD-10-CM

## 2023-02-27 DIAGNOSIS — R0602 Shortness of breath: Secondary | ICD-10-CM

## 2023-02-27 DIAGNOSIS — G9332 Myalgic encephalomyelitis/chronic fatigue syndrome: Secondary | ICD-10-CM

## 2023-02-27 DIAGNOSIS — G4733 Obstructive sleep apnea (adult) (pediatric): Secondary | ICD-10-CM

## 2023-03-03 DIAGNOSIS — Z0000 Encounter for general adult medical examination without abnormal findings: Secondary | ICD-10-CM | POA: Insufficient documentation

## 2023-03-03 DIAGNOSIS — F5104 Psychophysiologic insomnia: Secondary | ICD-10-CM | POA: Insufficient documentation

## 2023-03-03 DIAGNOSIS — R252 Cramp and spasm: Secondary | ICD-10-CM | POA: Insufficient documentation

## 2023-03-03 MED ORDER — RAMELTEON 8 MG PO TABS: 8.0000 mg | ORAL_TABLET | Freq: Every evening | ORAL | 1 refills | Status: DC | PRN

## 2023-03-03 NOTE — Assessment & Plan Note (Signed)
Chronic, she is encouraged to avoid known triggers. I will send refill of Nurtec to use daily prn. If sx worsen, will consider Neuro evaluation.

## 2023-03-03 NOTE — Assessment & Plan Note (Signed)
Chronic, fair control. Goal BP<120/80.  She will continue with valsartan160/25mg  daily and Tiadylt ER 240mg  daily. EKG performed, NSR w/o acute changes. She will rto in four months for re-evaluation. Encouraged to follow low sodium diet.

## 2023-03-03 NOTE — Assessment & Plan Note (Signed)

## 2023-03-03 NOTE — Assessment & Plan Note (Signed)
She is encouraged to strive for BMI less than 30 to decrease cardiac risk. Advised to aim for at least 150 minutes of exercise per week.  

## 2023-03-03 NOTE — Assessment & Plan Note (Signed)
Chronic, encouraged to adopt a bedtime routine. I will send rx Rozerem to use nightly.

## 2023-03-03 NOTE — Assessment & Plan Note (Signed)
She is encouraged to stay well hydrated. I will check Mg, calcium, potassium levels along with CPK. May benefit from  CoQ10 supplementation since she is on statin therapy.

## 2023-03-03 NOTE — Assessment & Plan Note (Addendum)
Chronic, LDL goal < 70.  She will continue with pravastatin 80mg  daily. She is encouraged to follow heart healthy lifestyle and Mediterranean diet.

## 2023-03-03 NOTE — Assessment & Plan Note (Signed)
Chronic, diabetic foot exam was performed. She will f/u in 3-4 months. No med changes today, will consider after reviewing labs.  I DISCUSSED WITH THE PATIENT AT LENGTH REGARDING THE GOALS OF GLYCEMIC CONTROL AND POSSIBLE LONG-TERM COMPLICATIONS.  I  ALSO STRESSED THE IMPORTANCE OF COMPLIANCE WITH HOME GLUCOSE MONITORING, DIETARY RESTRICTIONS INCLUDING AVOIDANCE OF SUGARY DRINKS/PROCESSED FOODS,  ALONG WITH REGULAR EXERCISE.  I  ALSO STRESSED THE IMPORTANCE OF ANNUAL EYE EXAMS, SELF FOOT CARE AND COMPLIANCE WITH OFFICE VISITS.

## 2023-03-04 ENCOUNTER — Ambulatory Visit: Payer: BC Managed Care – PPO | Admitting: Internal Medicine

## 2023-03-07 NOTE — Progress Notes (Signed)
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 female patient who is seen upon referral on 01/30/2023 from Dr Allyne Gee and is reportedly not using her CPAP  for several years, wants to get Inspire . The last sleep study was a CPAP titration in our office and it took place in July 2018,  I would like to add that the patient and her polysomnography from July 2018 had an AHI of 52.1/h so this was severe sleep apnea her oxygen saturation was 76% at nadir and her time below 89% oxygen saturation was 72 minutes. This is severe sleep apnea with severe hypoxia. At the time she had endorsed the Epworth sleepiness score at 14 out of 25 points  6 years ago the patient was titrated from the beginning pressure of 5 cm water to a final pressure of 12 cmH2O she actually did best in terms of sleep efficiency at 11 cm water pressure she slept about 114 minutes over 2 hours under that pressure with a residual AHI of 0.4/h so this was an excellent resolution for the night of testing.She remembers being given a FFM. She reportedly used CPAP for about 18-24 month and then her home had an ant- infestation that also affected her CPAP machine and stopped using it. Now here to see what's new on the market.  We had a lengthy discussion of BMI, abdominal girth, hypoxia and EM dependent or  central apneas not being treated with inspire.   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   Epworth sleepiness score: 5/24. FSS at 19/63 points    BMI: 35 kg/m   Neck Circumference: 16"   FINDINGS:   Sleep Summary:   Total Recording Time (hours, min): 7  hours 50 minutes      Total Sleep Time (hours, min): 5 hours 58 minutes               Percent REM (%):    32.5%    Sleep latency was 13 minutes and REM sleep latency 91 minutes.  The patient was awake for 63 minutes total after initial sleep onset.                                  Respiratory Indices by AASM criteria:   Calculated pAHI (per hour):   48.1/h                          REM pAHI:   66.4/h                                              NREM pAHI: 40/h                             Positional AHI: The vast majority of sleep was recorded in supine with an  AHI of 46.4/h and an RDI of 47.6/h.  This was followed by 52 minutes in left lateral sleep with an AHI of 57/h.  Snoring reached a mean volume of 41 dB and was present for three quarters of the total recorded sleep time.                                                 Oxygen Saturation Statistics:   O2 Saturation Range (%):   Between the nadir at 83% with a maximum saturation of 99% with a mean saturation of 93%.                                    O2 Saturation (minutes) <89%: 1.8 minutes, consistent of 172 desaturation events.          Pulse Rate Statistics:   Pulse Mean (bpm):    86 bpm             Pulse Range:     Between 71 and 105 bpm            IMPRESSION:  This HST confirms the presence of severe sleep apnea of obstructive origin.  Sleep was mild to moderately fragmented.  There was no tachybradycardia and no prolonged hypoxia.  Given the patient's BMI of her best option is to use CPAP or BiPAP again.Marland Kitchen   RECOMMENDATION: Autotitration CPAP device with a setting between 6 and 16 cm water pressure ,3 cm water EPR, heated humidifier and interface of her choice.  The patient had used CPAP successfully in the past and could draw on her experience to choose a best interface for her.  PS : I do not recommend the inspire device for patients with elevated BMI and REM sleep accentuated sleep apnea.  Usually, the best outcome is  a reduction of 60 to 70% and AHI which is just not good enough.    INTERPRETING PHYSICIAN:   Melvyn Novas, MD  [facility] Sleep Board certified by [employer] and Diplomate of the [employer] of [employer].

## 2023-03-12 NOTE — Addendum Note (Signed)
Addended by: Melvyn Novas on: 03/12/2023 12:38 PM   Modules accepted: Orders

## 2023-03-12 NOTE — Procedures (Signed)
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 female patient who is seen upon referral on 01/30/2023 from Dr Allyne Gee and is reportedly not using her CPAP  for several years, wants to get Inspire . The last sleep study was a CPAP titration in our office and it took place in July 2018,  I would like to add that the patient and her polysomnography from July 2018 had an AHI of 52.1/h so this was severe sleep apnea her oxygen saturation was 76% at nadir and her time below 89% oxygen saturation was 72 minutes. This is severe sleep apnea with severe hypoxia. At the time she had endorsed the Epworth sleepiness score at 14 out of 25 points  6 years ago the patient was titrated from the beginning pressure of 5 cm water to a final pressure of 12 cmH2O she actually did best in terms of sleep efficiency at 11 cm water pressure she slept about 114 minutes over 2 hours under that pressure with a residual AHI of 0.4/h so this was an excellent resolution for the night of testing.She remembers being given a FFM. She reportedly used CPAP for about 18-24 month and then her home had an ant- infestation that also affected her CPAP machine and stopped using it. Now here to see what's new on the market.  We had a lengthy discussion of BMI, abdominal girth, hypoxia and EM dependent or  central apneas not being treated with inspire.   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   Epworth sleepiness score: 5/24. FSS at 19/63 points    BMI: 35 kg/m   Neck Circumference: 16"   FINDINGS:   Sleep Summary:   Total Recording Time (hours, min): 7  hours 50 minutes      Total Sleep Time (hours, min): 5 hours 58 minutes               Percent REM (%):    32.5%    Sleep latency was 13 minutes and REM sleep latency 91 minutes.  The patient was awake for 63 minutes total after initial sleep onset.                                  Respiratory Indices by AASM criteria:   Calculated pAHI (per hour):   48.1/h                          REM pAHI:   66.4/h                                              NREM pAHI: 40/h                             Positional AHI: The vast majority of sleep was recorded in supine with an  AHI of 46.4/h and an RDI of 47.6/h.  This was followed by 52 minutes in left lateral sleep with an AHI of 57/h.  Snoring reached a mean volume of 41 dB and was present for three quarters of the total recorded sleep time.                                                 Oxygen Saturation Statistics:   O2 Saturation Range (%):   Between the nadir at 83% with a maximum saturation of 99% with a mean saturation of 93%.                                    O2 Saturation (minutes) <89%: 1.8 minutes, consistent of 172 desaturation events.          Pulse Rate Statistics:   Pulse Mean (bpm):    86 bpm             Pulse Range:     Between 71 and 105 bpm            IMPRESSION:  This HST confirms the presence of severe sleep apnea of obstructive origin.  Sleep was mild to moderately fragmented.  There was no tachybradycardia and no prolonged hypoxia.  Given the patient's BMI of her best option is to use CPAP or BiPAP again.Marland Kitchen   RECOMMENDATION: Autotitration CPAP device with a setting between 6 and 16 cm water pressure ,3 cm water EPR, heated humidifier and interface of her choice.  The patient had used CPAP successfully in the past and could draw on her experience to choose a best interface for her.  PS : I do not recommend the inspire device for patients with elevated BMI and REM sleep accentuated sleep apnea.  Usually, the best outcome is  a reduction of 60 to 70% and AHI which is just not good enough.    INTERPRETING PHYSICIAN:   Melvyn Novas, MD  [facility] Sleep Board certified by [employer] and Diplomate of the [employer] of Sleep Medicine.

## 2023-03-14 ENCOUNTER — Telehealth: Payer: Self-pay

## 2023-03-14 NOTE — Telephone Encounter (Signed)
Spoke with patient about the results of her home sleep study. Patient understands and had no questions at this time. Patient does not have a preference on a DME company. Informed her autoCPAP order will be sent out and to look for a phone call to be set up within the next 7-10 business days. Asked her to call us back if she doesn't hear from anyone. Also let pt know that she will need a follow up appt back with [place] once she is setup on new machine. She will be looking for that appt too.

## 2023-03-14 NOTE — Telephone Encounter (Signed)
-----   Message from Nurse Baird Lyons B sent at 03/14/2023  9:12 AM EDT -----  ----- Message ----- From: Melvyn Novas, MD Sent: 03/12/2023  12:37 PM EDT To: Dorothyann Peng, MD; Gna-Pod 3 Results  Given the patient's BMI of her best option is to use CPAP or BiPAP again,  This HST confirms the presence of severe sleep apnea of obstructive origin.  Sleep was mild to moderately fragmented.  There was no tachybradycardia and no prolonged hypoxia but many brief 02 desaturations, AHI was 48.1/h.  Autotitration CPAP device with a setting between 6 and 16 cm water pressure ,3 cm water EPR, heated humidifier and interface of her choice.  The patient had used CPAP successfully in the past and could draw on her experience to choose a best interface for her.

## 2023-03-25 ENCOUNTER — Telehealth: Payer: Self-pay

## 2023-03-25 NOTE — Telephone Encounter (Signed)
Patient's PA for nurtec has been submitted through covermymeds. Awaiting determination. YL,RMA

## 2023-03-28 ENCOUNTER — Other Ambulatory Visit: Payer: Self-pay | Admitting: Internal Medicine

## 2023-04-01 ENCOUNTER — Encounter: Payer: Self-pay | Admitting: Internal Medicine

## 2023-04-01 ENCOUNTER — Ambulatory Visit: Payer: BC Managed Care – PPO

## 2023-04-01 ENCOUNTER — Other Ambulatory Visit: Payer: Self-pay | Admitting: Internal Medicine

## 2023-04-01 DIAGNOSIS — I129 Hypertensive chronic kidney disease with stage 1 through stage 4 chronic kidney disease, or unspecified chronic kidney disease: Secondary | ICD-10-CM

## 2023-04-01 DIAGNOSIS — E7800 Pure hypercholesterolemia, unspecified: Secondary | ICD-10-CM

## 2023-04-01 DIAGNOSIS — E1122 Type 2 diabetes mellitus with diabetic chronic kidney disease: Secondary | ICD-10-CM

## 2023-04-13 ENCOUNTER — Other Ambulatory Visit: Payer: Self-pay | Admitting: Internal Medicine

## 2023-04-19 ENCOUNTER — Ambulatory Visit
Admission: RE | Admit: 2023-04-19 | Discharge: 2023-04-19 | Disposition: A | Discharge status: Home / Self Care | Payer: BC Managed Care – PPO | Source: Ambulatory Visit | Attending: Internal Medicine | Admitting: Internal Medicine

## 2023-04-19 DIAGNOSIS — Z1231 Encounter for screening mammogram for malignant neoplasm of breast: Secondary | ICD-10-CM

## 2023-04-30 ENCOUNTER — Telehealth: Payer: Self-pay | Admitting: Neurology

## 2023-04-30 NOTE — Telephone Encounter (Signed)
Rescheduled patient's Initial CPAP on 06/27/23 at 8:30 am. Appointment notes has 05/03/23-07/03/23 to schedule appointment.

## 2023-05-06 IMAGING — MG MM DIGITAL SCREENING BILAT W/ TOMO AND CAD
6 of 12 series · 6 of 36 positions shown · non-contrast
Comparison: Previous exam(s).

ACR Breast Density Category a: The breast tissue is almost entirely
fatty.

CLINICAL DATA: Screening.

EXAM:
DIGITAL SCREENING BILATERAL MAMMOGRAM WITH TOMOSYNTHESIS AND CAD
TECHNIQUE: Bilateral screening digital craniocaudal and mediolateral oblique
mammograms were obtained. Bilateral screening digital breast
tomosynthesis was performed. The images were evaluated with
computer-aided detection.

[L MLO synth-2D]
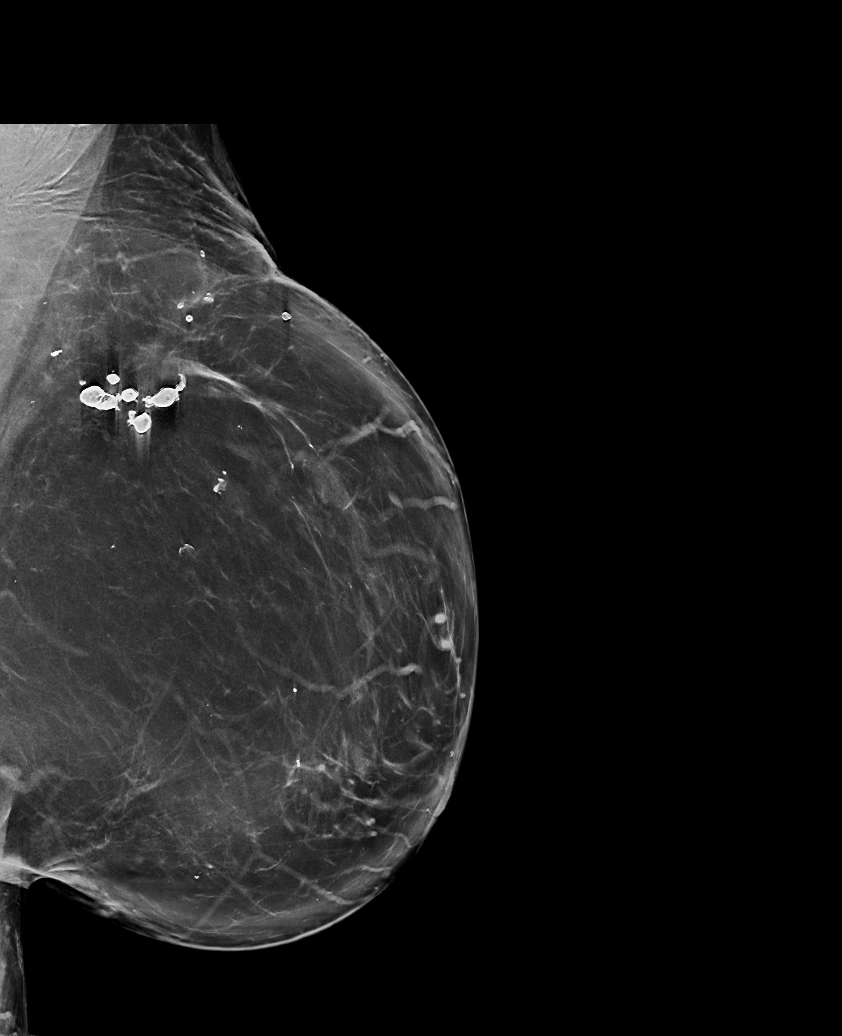

[R MLO synth-2D (1 of 2)]
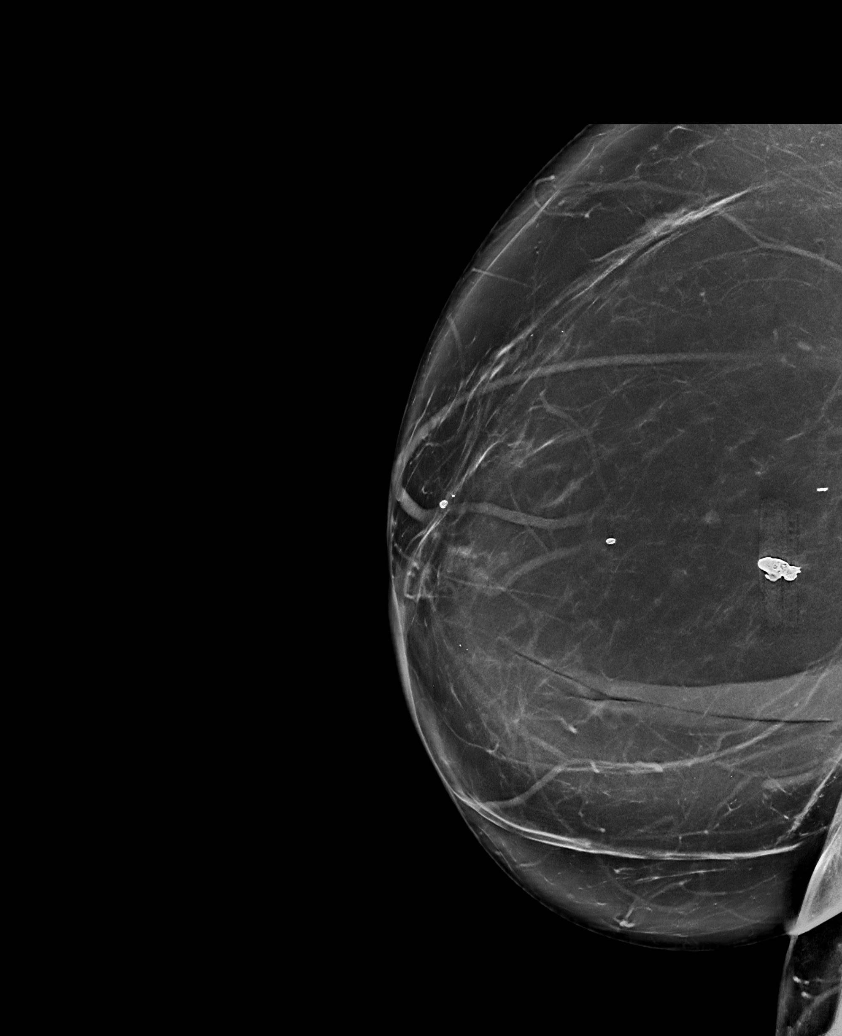

[L CC synth-2D]
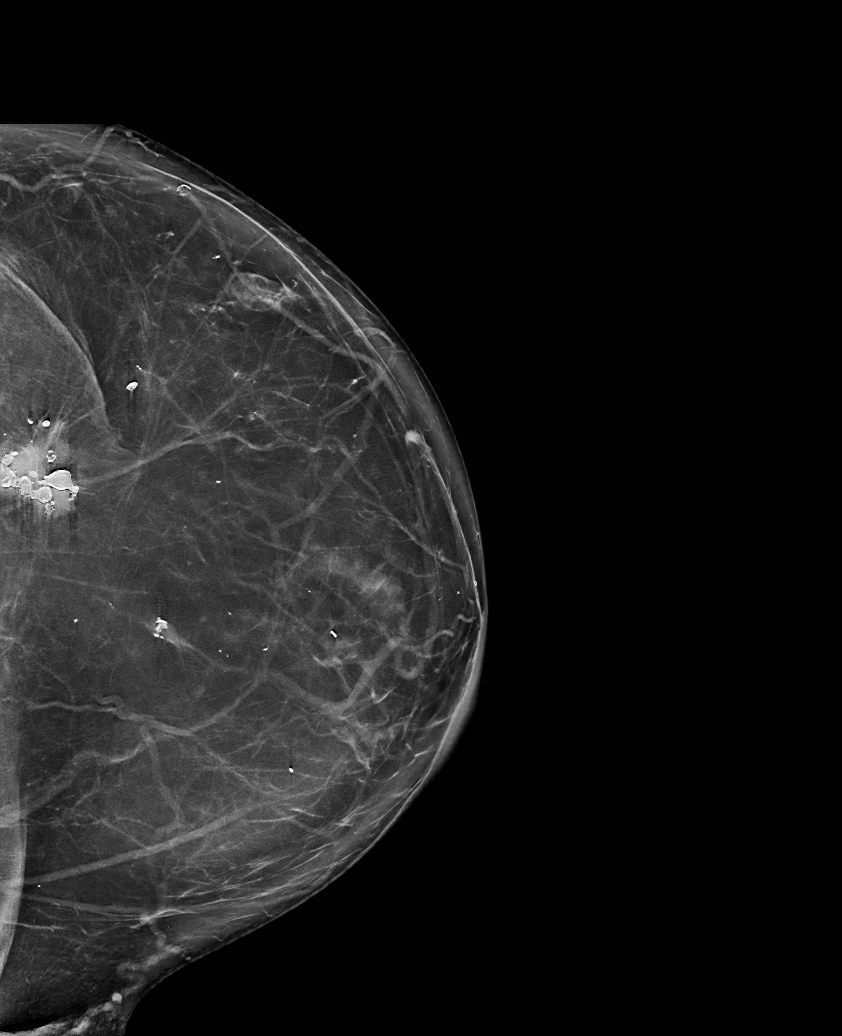

[R CC synth-2D (1 of 2)]
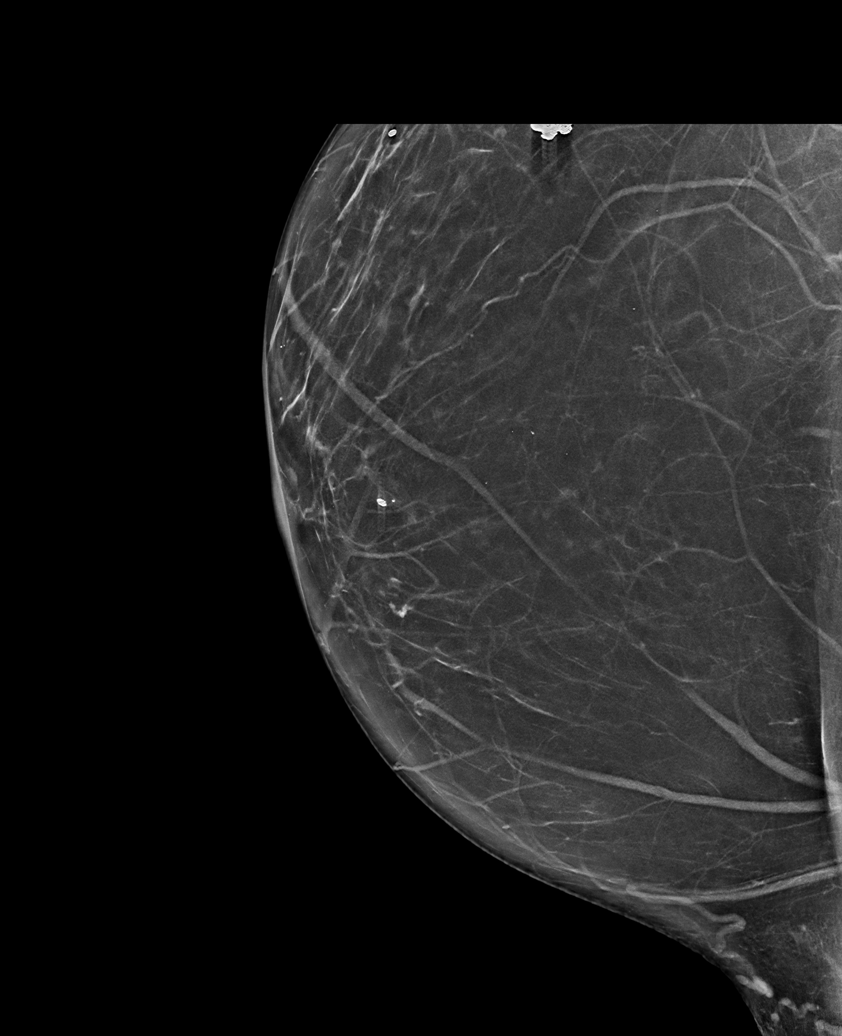

[R CC synth-2D (2 of 2)]
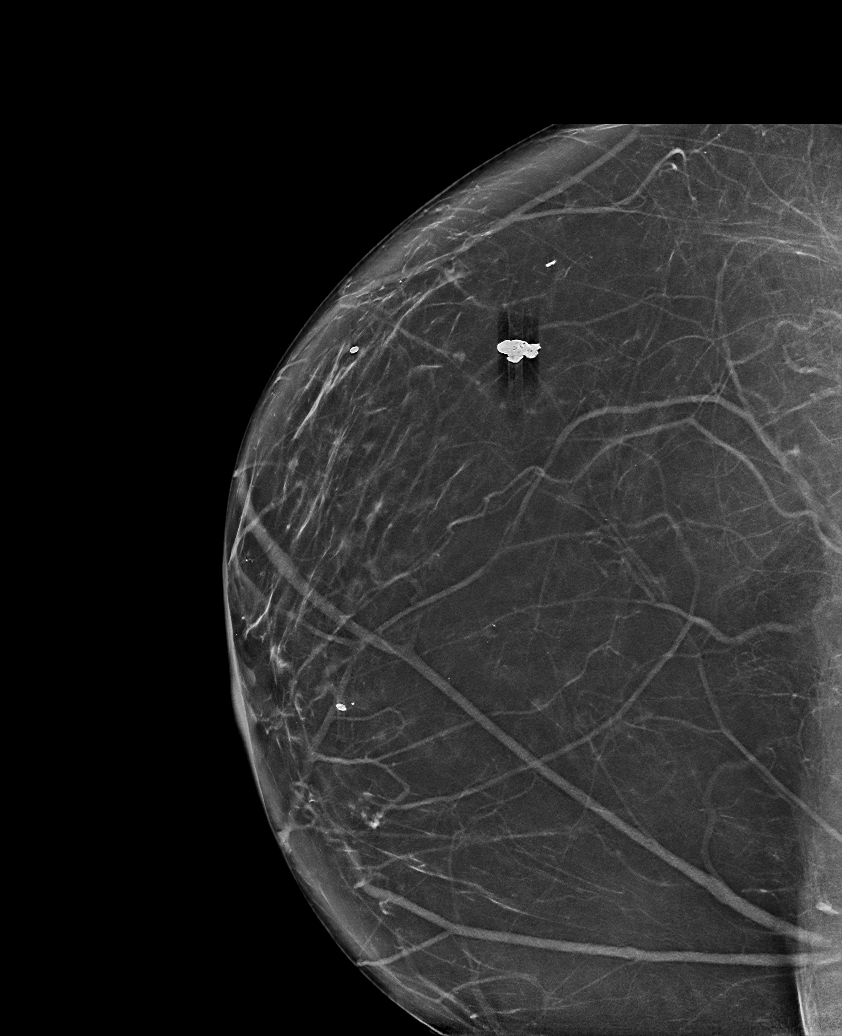

[R MLO synth-2D (2 of 2)]
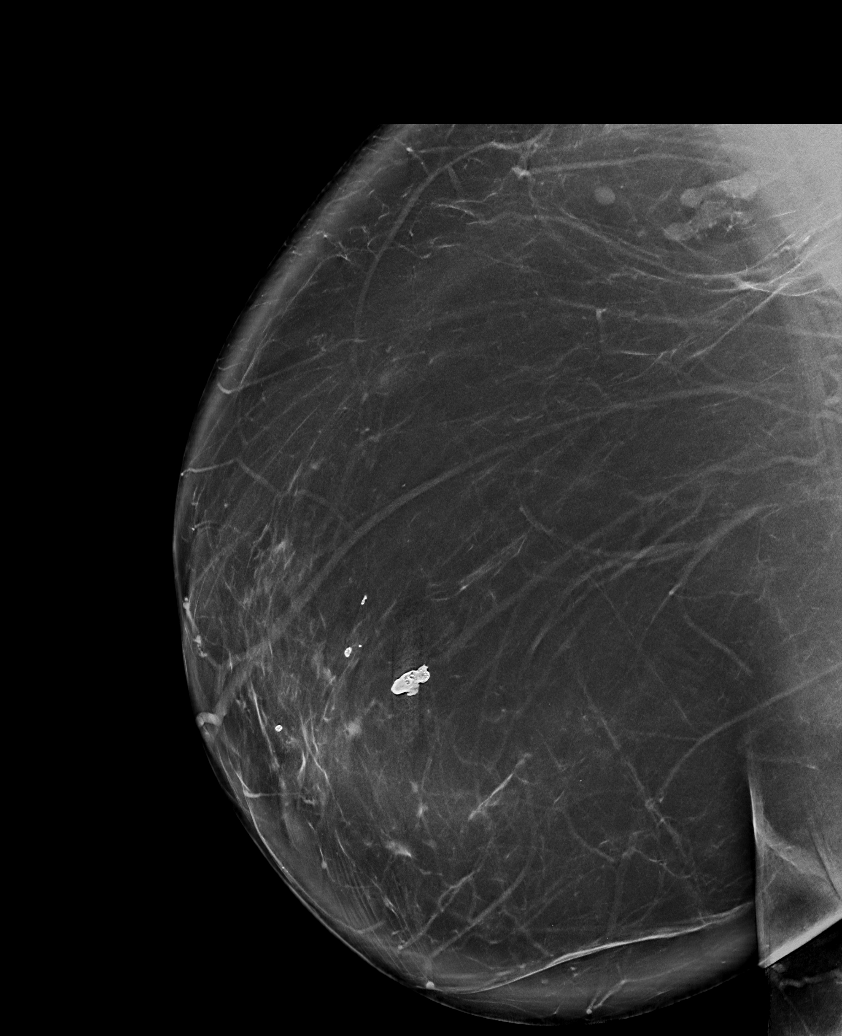

[6 of 36 positions shown; findings below may reference images not displayed]

FINDINGS: There are no findings suspicious for malignancy.
IMPRESSION: No mammographic evidence of malignancy. A result letter of this
screening mammogram will be mailed directly to the patient.

RECOMMENDATION:
Screening mammogram in one year. (Code:0E-3-N98)

BI-RADS CATEGORY  1: Negative.

## 2023-05-17 ENCOUNTER — Other Ambulatory Visit: Payer: Self-pay | Admitting: Internal Medicine

## 2023-05-17 DIAGNOSIS — F419 Anxiety disorder, unspecified: Secondary | ICD-10-CM

## 2023-05-17 MED ORDER — DIAZEPAM 5 MG PO TABS: ORAL_TABLET | ORAL | 0 refills | Status: DC

## 2023-05-20 ENCOUNTER — Ambulatory Visit: Payer: BC Managed Care – PPO | Admitting: Neurology

## 2023-05-24 ENCOUNTER — Other Ambulatory Visit (HOSPITAL_COMMUNITY): Payer: Self-pay

## 2023-05-24 MED ORDER — AMPHETAMINE-DEXTROAMPHETAMINE 10 MG PO TABS
10.0000 mg | ORAL_TABLET | Freq: Every day | ORAL | 0 refills | Status: DC
  Filled 2023-10-07: qty 30, 30d supply, fill #0

## 2023-05-24 MED ORDER — AMPHETAMINE-DEXTROAMPHET ER 30 MG PO CP24
30.0000 mg | ORAL_CAPSULE | Freq: Every day | ORAL | 0 refills | Status: DC
  Filled 2023-07-15: qty 30, 30d supply, fill #0

## 2023-05-24 MED ORDER — AMPHETAMINE-DEXTROAMPHETAMINE 10 MG PO TABS
10.0000 mg | ORAL_TABLET | Freq: Every day | ORAL | 0 refills | Status: DC
  Filled 2023-05-24 – 2023-07-15 (×2): qty 30, 30d supply, fill #0

## 2023-05-24 MED ORDER — AMPHETAMINE-DEXTROAMPHET ER 20 MG PO CP24
20.0000 mg | ORAL_CAPSULE | Freq: Every day | ORAL | 0 refills | Status: DC
  Filled 2023-05-24 (×2): qty 30, 30d supply, fill #0

## 2023-05-24 MED ORDER — AMPHETAMINE-DEXTROAMPHET ER 20 MG PO CP24
20.0000 mg | ORAL_CAPSULE | Freq: Every day | ORAL | 0 refills | Status: DC
  Filled 2023-08-23: qty 30, 30d supply, fill #0

## 2023-05-24 MED ORDER — AMPHETAMINE-DEXTROAMPHET ER 20 MG PO CP24
20.0000 mg | ORAL_CAPSULE | Freq: Every day | ORAL | 0 refills | Status: DC
  Filled 2023-07-15: qty 30, 30d supply, fill #0

## 2023-05-24 MED ORDER — AMPHETAMINE-DEXTROAMPHETAMINE 10 MG PO TABS
10.0000 mg | ORAL_TABLET | ORAL | 0 refills | Status: DC
  Filled 2023-08-23: qty 30, 30d supply, fill #0

## 2023-05-24 MED ORDER — AMPHETAMINE-DEXTROAMPHET ER 30 MG PO CP24
30.0000 mg | ORAL_CAPSULE | Freq: Every day | ORAL | 0 refills | Status: DC
  Filled 2023-05-24 (×4): qty 30, 30d supply, fill #0

## 2023-05-24 MED ORDER — AMPHETAMINE-DEXTROAMPHET ER 30 MG PO CP24
30.0000 mg | ORAL_CAPSULE | Freq: Every day | ORAL | 0 refills | Status: DC
  Filled 2023-08-23: qty 30, 30d supply, fill #0

## 2023-05-27 ENCOUNTER — Other Ambulatory Visit: Payer: Self-pay

## 2023-05-27 ENCOUNTER — Other Ambulatory Visit (HOSPITAL_COMMUNITY): Payer: Self-pay

## 2023-05-28 ENCOUNTER — Other Ambulatory Visit: Payer: Self-pay | Admitting: Internal Medicine

## 2023-05-29 ENCOUNTER — Encounter: Payer: Self-pay | Admitting: Internal Medicine

## 2023-05-29 ENCOUNTER — Ambulatory Visit: Payer: BC Managed Care – PPO | Admitting: Internal Medicine

## 2023-05-29 VITALS — BP 134/82 | HR 94 | Temp 98.3°F | Ht 64.0 in | Wt 203.6 lb

## 2023-05-29 DIAGNOSIS — I129 Hypertensive chronic kidney disease with stage 1 through stage 4 chronic kidney disease, or unspecified chronic kidney disease: Secondary | ICD-10-CM | POA: Diagnosis not present

## 2023-05-29 DIAGNOSIS — Z23 Encounter for immunization: Secondary | ICD-10-CM | POA: Diagnosis not present

## 2023-05-29 DIAGNOSIS — E66811 Obesity, class 1: Secondary | ICD-10-CM

## 2023-05-29 DIAGNOSIS — N182 Chronic kidney disease, stage 2 (mild): Secondary | ICD-10-CM | POA: Diagnosis not present

## 2023-05-29 DIAGNOSIS — G43009 Migraine without aura, not intractable, without status migrainosus: Secondary | ICD-10-CM | POA: Diagnosis not present

## 2023-05-29 DIAGNOSIS — E1122 Type 2 diabetes mellitus with diabetic chronic kidney disease: Secondary | ICD-10-CM

## 2023-05-29 DIAGNOSIS — F5104 Psychophysiologic insomnia: Secondary | ICD-10-CM

## 2023-05-29 DIAGNOSIS — N1831 Chronic kidney disease, stage 3a: Secondary | ICD-10-CM

## 2023-05-29 DIAGNOSIS — Z6834 Body mass index (BMI) 34.0-34.9, adult: Secondary | ICD-10-CM

## 2023-05-29 DIAGNOSIS — F320 Major depressive disorder, single episode, mild: Secondary | ICD-10-CM

## 2023-05-29 DIAGNOSIS — E7800 Pure hypercholesterolemia, unspecified: Secondary | ICD-10-CM

## 2023-05-29 DIAGNOSIS — E6609 Other obesity due to excess calories: Secondary | ICD-10-CM

## 2023-05-29 MED ORDER — NURTEC 75 MG PO TBDP: 75.0000 mg | ORAL_TABLET | Freq: Every day | ORAL | 2 refills | Status: AC | PRN

## 2023-05-29 NOTE — Progress Notes (Signed)
I,Victoria T Deloria Lair, CMA,acting as a Neurosurgeon for [name], MD.,have documented all relevant documentation on the behalf of [name], MD,as directed by  [name], MD while in the presence of [name], MD.  Subjective:  Patient ID: Brittany Rubio , female    DOB: Nov 22, 1960 , 62 y.o.   MRN: [id number]  Chief Complaint  Patient presents with   Diabetes   Hypertension    HPI  Pt presents today for DM/HTN check. She reports compliance with meds. Denies headaches, chest pain and shortness of breath. She admits not taking Ozempic for 3 weeks, she was out of town.  She doesn't like to take med when traveling due to her more liberal diet.  She also reports not being comfortable with giving herself the shot. Her husband administers for her.   She reports not yet scheduling DM eye exam.  Letter sent to Dr Henreitta Leber for pap result.     Diabetes She presents for her follow-up diabetic visit. She has type 2 diabetes mellitus. Her disease course has been stable. Pertinent negatives for hypoglycemia include no dizziness. Pertinent negatives for diabetes include no blurred vision, no polydipsia, no polyphagia and no polyuria. There are no hypoglycemic complications. Diabetic complications include nephropathy. Risk factors for coronary artery disease include diabetes mellitus, dyslipidemia, hypertension, obesity, sedentary lifestyle, post-menopausal and stress. Her weight is fluctuating minimally. She is following a diabetic diet.  Hypertension This is a chronic problem. The current episode started more than 1 year ago. The problem has been gradually improving since onset. The problem is uncontrolled. Pertinent negatives include no blurred vision or palpitations.     Past Medical History:  Diagnosis Date   ADD (attention deficit disorder)    Anemia    Anxiety    Arthritis    "left shoulder" (07/07/2014)   Back pain    Breast cancer (HCC)    "left"   Depression     Fatty liver    Food allergy    shellfish   GERD (gastroesophageal reflux disease)    "takes over the counters as needed"   Hernia, umbilical    High cholesterol    Hypertension 11/26/2011   sees Dr. Velna Hatchet   Hypothyroidism    Incisional hernia without obstruction or gangrene 06/05/2018   Joint pain    Migraines    "maybe once q 3 months" (07/07/2014)   Multinodular thyroid 2015   OSA on CPAP    Personal history of radiation therapy    Pneumonia    May 2018   Sepsis due to Streptococcus pneumoniae (HCC)    Septic shock (HCC)    Severe sepsis (HCC) 06/05/2019   Thyroid cancer (HCC)    2015   Type II diabetes mellitus (HCC)    Vitamin D deficiency      Family History  Problem Relation Age of Onset   Cancer Mother        Pancreatic   Diabetes Mother    Hypertension Mother    Depression Mother    Hypertension Father    Alcoholism Father      Current Outpatient Medications:    amphetamine-dextroamphetamine (ADDERALL XR) 20 MG 24 hr capsule, Take 20 mg by mouth daily. 1 per day with the 10 mg and 30 mg xr, Disp: , Rfl:    amphetamine-dextroamphetamine (ADDERALL XR) 20 MG 24 hr capsule, Take 1 capsule (20 mg total) by mouth daily., Disp: 30 capsule, Rfl: 0   amphetamine-dextroamphetamine (  ADDERALL XR) 20 MG 24 hr capsule, Take 1 capsule by mouth daily; may be filled 30 days after date prescribed, Disp: 30 capsule, Rfl: 0   amphetamine-dextroamphetamine (ADDERALL XR) 20 MG 24 hr capsule, Take 1 capsule by mouth daily; may be filled 60 days after date prescribed, Disp: 30 capsule, Rfl: 0   amphetamine-dextroamphetamine (ADDERALL XR) 30 MG 24 hr capsule, Take 30 mg by mouth daily. Take 1 capsule with the 10 mg and the 20 mg xr, Disp: , Rfl:    amphetamine-dextroamphetamine (ADDERALL XR) 30 MG 24 hr capsule, Take 1 capsule by mouth daily; may be filled 60 days after date prescribed, Disp: 30 capsule, Rfl: 0   amphetamine-dextroamphetamine (ADDERALL XR) 30 MG 24 hr capsule,  Take 1 capsule by mouth daily; may be filled 30 days after date prescribed, Disp: 30 capsule, Rfl: 0   amphetamine-dextroamphetamine (ADDERALL XR) 30 MG 24 hr capsule, Take 1 capsule (30 mg total) by mouth daily., Disp: 30 capsule, Rfl: 0   amphetamine-dextroamphetamine (ADDERALL) 10 MG tablet, Take 10 mg by mouth daily with breakfast. Take 1 per day with the 20 mg xr and the 30 mg xr, Disp: , Rfl:    amphetamine-dextroamphetamine (ADDERALL) 10 MG tablet, Take 1 tablet by mouth daily in the afternoon. May be filled 60 days after date prescribed, Disp: 30 tablet, Rfl: 0   amphetamine-dextroamphetamine (ADDERALL) 10 MG tablet, Take 1 tablet by mouth daily in the afternoon, Disp: 30 tablet, Rfl: 0   amphetamine-dextroamphetamine (ADDERALL) 10 MG tablet, Take 1 tablet by mouth daily in the afternoon. May be filled 30 days after date prescribed, Disp: 30 tablet, Rfl: 0   cholecalciferol (VITAMIN D3) 25 MCG (1000 UT) tablet, Take 1,000 Units by mouth daily., Disp: , Rfl:    Cyanocobalamin (VITAMIN B-12 PO), Take 1 tablet by mouth once a week. Tuesdays, Disp: , Rfl:    diazepam (VALIUM) 5 MG tablet, TAKE 1 TABLET BY MOUTH EVERY DAY AS NEEDED, Disp: 20 tablet, Rfl: 0   EPINEPHrine 0.3 mg/0.3 mL IJ SOAJ injection, Inject 0.3 mg into the muscle once as needed. Allergic reaction, Disp: 1 each, Rfl: 3   famotidine (PEPCID) 20 MG tablet, Take 20 mg by mouth as needed for heartburn or indigestion., Disp: , Rfl:    levocetirizine (XYZAL) 5 MG tablet, TAKE 1 TABLET BY MOUTH EVERY DAY IN THE EVENING, Disp: 90 tablet, Rfl: 1   Levothyroxine Sodium 175 MCG/ML SOLN, Take 175 mcg by mouth daily., Disp: 30 mL, Rfl: 5   Magnesium 500 MG CAPS, Take 500 mg by mouth daily., Disp: , Rfl:    pravastatin (PRAVACHOL) 80 MG tablet, TAKE ONE TABLET BY MOUTH MON-SUNDAY, Disp: 75 tablet, Rfl: 2   promethazine (PHENERGAN) 25 MG tablet, TAKE 1 TABLET BY MOUTH EVERY 8 HOURS AS NEEDED, Disp: 30 tablet, Rfl: 0    valsartan-hydrochlorothiazide (DIOVAN-HCT) 160-25 MG tablet, TAKE 1 TABLET BY MOUTH EVERY DAY, Disp: 90 tablet, Rfl: 1   vitamin C (ASCORBIC ACID) 500 MG tablet, Take 500 mg by mouth daily., Disp: , Rfl:    vortioxetine HBr (TRINTELLIX) 20 MG TABS tablet, Take 1 tablet (20 mg total) by mouth daily., Disp: 90 tablet, Rfl: 1   XIGDUO XR 11-998 MG TB24, TAKE 1 TABLET BY MOUTH EVERY DAY, Disp: 30 tablet, Rfl: 1   diltiazem (CARTIA XT) 240 MG 24 hr capsule, Take 1 capsule (240 mg total) by mouth daily., Disp: 90 capsule, Rfl: 2   Rimegepant Sulfate (NURTEC) 75 MG TBDP, Take 1  tablet (75 mg total) by mouth daily as needed., Disp: 10 tablet, Rfl: 2   Semaglutide, 2 MG/DOSE, (OZEMPIC, 2 MG/DOSE,) 8 MG/3ML SOPN, INJECT 2 MG INTO THE SKIN ONCE A WEEK. (Patient not taking: Reported on 05/29/2023), Disp: 3 mL, Rfl: 3   Allergies  Allergen Reactions   Crab [Shellfish Allergy] Hives and Swelling    "Only when I eat too many crab legs" Can tolerate CT scans with contrast-does not need premeds       Review of Systems  Constitutional: Negative.   Eyes:  Negative for blurred vision.  Respiratory: Negative.    Cardiovascular: Negative.  Negative for palpitations.  Endocrine: Negative for polydipsia, polyphagia and polyuria.  Neurological: Negative.  Negative for dizziness.  Psychiatric/Behavioral: Negative.       Today's Vitals   05/29/23 1610  BP: 134/82  Pulse: 94  Temp: 98.3 F (36.8 C)  SpO2: 98%  Weight: 203 lb 9.6 oz (92.4 kg)  Height: 5\' 4"  (1.626 m)   Body mass index is 34.95 kg/m.  Wt Readings from Last 3 Encounters:  05/29/23 203 lb 9.6 oz (92.4 kg)  02/22/23 198 lb 3.2 oz (89.9 kg)  01/30/23 200 lb 3.2 oz (90.8 kg)     Objective:  Physical Exam Vitals and nursing note reviewed.  Constitutional:      Appearance: Normal appearance.  HENT:     Head: Normocephalic and atraumatic.  Eyes:     Extraocular Movements: Extraocular movements intact.  Cardiovascular:     Rate and  Rhythm: Normal rate and regular rhythm.     Heart sounds: Normal heart sounds.  Pulmonary:     Effort: Pulmonary effort is normal.     Breath sounds: Normal breath sounds.  Musculoskeletal:     Cervical back: Normal range of motion.  Skin:    General: Skin is warm.  Neurological:     General: No focal deficit present.     Mental Status: She is alert.  Psychiatric:        Mood and Affect: Mood normal.        Behavior: Behavior normal.         Assessment And Plan:  Type 2 diabetes mellitus with stage 2 chronic kidney disease, without long-term current use of insulin (HCC) Assessment & Plan: Chronic, she will f/u in 3-4 months. Importance of medication/dietary compliance was discussed with the patient. She will continue with weekly semaglutide 2mg  weekly and Xigduo XR 5/1000mg  daily. I do plan to increase dose to 10/1000mg . She is encouraged to stay well hydrated.    Hypertensive nephropathy Assessment & Plan: Chronic, fair control. Goal BP<120/80.  She will continue with valsartan160/25mg  daily and Tiadylt ER 240mg  daily.  Encouraged to follow low sodium diet.    Migraine without aura and without status migrainosus, not intractable Assessment & Plan: Chronic, she is encouraged to avoid known triggers. I will send refill of Nurtec to use daily prn. If sx worsen, will consider Neuro evaluation.    Class 1 obesity due to excess calories with serious comorbidity and body mass index (BMI) of 34.0 to 34.9 in adult Assessment & Plan: She is encouraged to strive for BMI less than 30 to decrease cardiac risk. Advised to aim for at least 150 minutes of exercise per week.    Immunization due -     Flu vaccine trivalent PF, 6mos and older(Flulaval,Afluria,Fluarix,Fluzone) Best boy Vaccine 40yrs & older  Other orders -  Nurtec; Take 1 tablet (75 mg total) by mouth daily as needed.  Dispense: 10 tablet; Refill: 2     Return in 8 weeks (on 07/24/2023), or dm  check.  Patient was given opportunity to ask questions. Patient verbalized understanding of the plan and was able to repeat key elements of the plan. All questions were answered to their satisfaction.    I, [name], MD, have reviewed all documentation for this visit. The documentation on 06/09/23 for the exam, diagnosis, procedures, and orders are all accurate and complete.   IF YOU HAVE BEEN REFERRED TO A SPECIALIST, IT MAY TAKE 1-2 WEEKS TO SCHEDULE/PROCESS THE REFERRAL. IF YOU HAVE NOT HEARD FROM US/SPECIALIST IN TWO WEEKS, PLEASE GIVE [place] A CALL AT [phone] X [phone].   THE PATIENT IS ENCOURAGED TO PRACTICE SOCIAL DISTANCING DUE TO THE COVID-19 PANDEMIC.

## 2023-05-29 NOTE — Patient Instructions (Signed)

## 2023-06-07 ENCOUNTER — Other Ambulatory Visit: Payer: Self-pay | Admitting: Internal Medicine

## 2023-06-07 MED ORDER — DILTIAZEM HCL ER COATED BEADS 240 MG PO CP24: 240.0000 mg | ORAL_CAPSULE | Freq: Every day | ORAL | 2 refills | Status: DC

## 2023-06-09 NOTE — Assessment & Plan Note (Signed)
Chronic, she will f/u in 3-4 months. Importance of medication/dietary compliance was discussed with the patient. She will continue with weekly semaglutide 2mg  weekly and Xigduo XR 5/1000mg  daily. I do plan to increase dose to 10/1000mg . She is encouraged to stay well hydrated.

## 2023-06-09 NOTE — Assessment & Plan Note (Signed)
She is encouraged to strive for BMI less than 30 to decrease cardiac risk. Advised to aim for at least 150 minutes of exercise per week.

## 2023-06-09 NOTE — Assessment & Plan Note (Signed)
Chronic, fair control. Goal BP<120/80.  She will continue with valsartan160/25mg  daily and Tiadylt ER 240mg  daily.  Encouraged to follow low sodium diet.

## 2023-06-09 NOTE — Assessment & Plan Note (Signed)
Chronic, she is encouraged to avoid known triggers. I will send refill of Nurtec to use daily prn. If sx worsen, will consider Neuro evaluation.

## 2023-06-11 ENCOUNTER — Other Ambulatory Visit: Payer: Self-pay | Admitting: Internal Medicine

## 2023-06-11 DIAGNOSIS — I129 Hypertensive chronic kidney disease with stage 1 through stage 4 chronic kidney disease, or unspecified chronic kidney disease: Secondary | ICD-10-CM

## 2023-06-21 ENCOUNTER — Other Ambulatory Visit: Payer: Self-pay | Admitting: Internal Medicine

## 2023-06-27 ENCOUNTER — Ambulatory Visit: Payer: BC Managed Care – PPO | Admitting: Neurology

## 2023-06-27 ENCOUNTER — Encounter: Payer: Self-pay | Admitting: Neurology

## 2023-06-27 VITALS — BP 138/80 | HR 82 | Ht 64.0 in | Wt 208.0 lb

## 2023-06-27 DIAGNOSIS — G4733 Obstructive sleep apnea (adult) (pediatric): Secondary | ICD-10-CM

## 2023-06-27 NOTE — Patient Instructions (Signed)
ASSESSMENT AND PLAN 62 y.o. year old female  here with: DX was confirmed : severe OSA.     1) Caregiver burn out, obesity and lack of sleep routines were addressed today.   2) Continuation of CPAP care is my main recommendation.  We can recheck for alternative therapies once weight loss is achieved.    I plan to follow up through our NP within 12 months.   I would like to thank Dorothyann Peng, MD and Dorothyann Peng, Md [address] [place],  [place] [address] for allowing me to meet with and to take care of this pleasant patient.

## 2023-06-27 NOTE — Progress Notes (Signed)
Provider:  Melvyn Novas, MD  Primary Care Physician:  Dorothyann Peng, MD [address] [place] [address]     Referring Provider: Dorothyann Peng, Md [address] [place],  [place] [address]          Chief Complaint according to patient   Patient presents with:     New Patient (Initial Visit)           HISTORY OF PRESENT ILLNESS:  Brittany Rubio is a 62 y.o. female patient who is here for revisit 06/27/2023 for  HST follow up, current CPAP user.  Chief concern according to patient : I have the pleasure of meeting today with Brittany Rubio who underwent a repeat home sleep test on 03-08-2023.  The patient had a previous polysomnography in July 2018 a full night in the sleep lab.  At the time she produced severe sleep apnea obstructive with severe hypoxia and a nadir of 76% oxygen.  She is now using a CPAP again but she had looked for alternative in therapy.   Her home sleep test showed still an AHI of 48/h in rem sleep up to 66/h.  The oxygen nadir was this time 83% much better than it has been the previous polysomnography.  There were only 2 minutes of oxygen desaturation in total.  We discussed today that an alternative to CPAP is unlikely to help her until her BMI would reach 30 or below.  I would strongly recommend to continue the current use of AutoSet CPAP which is set between 6 and 16 cm pressure with 3 cm EPR and a residual AHI of 2.4 is achieved.  Very little central apnea has arisen.  T he 95th percentile pressure is 11.2 cm water I hope that she can find some joy and some routine and using her CPAP.     Today's fatigue severity scale at 32 out of 63 points.  Her Epworth sleepiness scale today is endorsed at 8 out of 24 points in the geriatric depression scale is endorsed at 3 out of 15 points and not clinically significant for the presence of depression.  We discussed sleep hygiene.  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         Review of Systems: Out of a complete 14 system review, the patient complains of only the following symptoms, and all other reviewed systems are negative.:  Fatigue, sleepiness , snoring, fragmented sleep   How likely are you to doze in the following situations: 0 = not likely, 1 = slight chance, 2 = moderate chance, 3 = high chance   Sitting and Reading? Watching Television? Sitting inactive in a public place (theater or meeting)? As a passenger in a car for an hour without a break? Lying down in the afternoon when circumstances permit? Sitting and talking to someone? Sitting quietly after lunch without alcohol? In a car, while stopped for a few minutes in traffic?   Today's fatigue severity scale at 32 out of 63 points.  Her Epworth sleepiness scale today is endorsed at 8 out of 24 points in the geriatric depression scale is endorsed at 3 out of 15 points and not clinically significant for the presence of depression.  Social History   Socioeconomic History   Marital status: Married    Spouse name:  Hobson   Number of children: Not on file   Years of education: Not on file   Highest education level: Master's degree (e.g., MA, MS, MEng, MEd, MSW, MBA)  Occupational History   Not on file  Tobacco Use   Smoking status: Never   Smokeless tobacco: Never  Vaping Use   Vaping status: Never Used  Substance and Sexual Activity   Alcohol use: Yes    Comment: 07/07/2014 "a few times/year; weddings, anniversary, etc"   Drug use: No   Sexual activity: Yes  Other Topics Concern   Not on file  Social History Narrative   Not on file   Social Drivers of Health   Financial Resource Strain: Low Risk  (12/01/2022)   Overall Financial Resource Strain (CARDIA)    Difficulty of Paying Living Expenses: Not hard at all  Food Insecurity: No Food Insecurity (12/01/2022)   Hunger Vital Sign     Worried About Running Out of Food in the Last Year: Never true    Ran Out of Food in the Last Year: Never true  Transportation Needs: No Transportation Needs (12/01/2022)   PRAPARE - Administrator, Civil Service (Medical): No    Lack of Transportation (Non-Medical): No  Physical Activity: Insufficiently Active (12/01/2022)   Exercise Vital Sign    Days of Exercise per Week: 1 day    Minutes of Exercise per Session: 20 min  Stress: No Stress Concern Present (12/01/2022)   [employer] of Occupational Health - Occupational Stress Questionnaire    Feeling of Stress : Only a little  Social Connections: Socially Integrated (12/01/2022)   Social Connection and Isolation Panel [NHANES]    Frequency of Communication with Friends and Family: More than three times a week    Frequency of Social Gatherings with Friends and Family: Twice a week    Attends Religious Services: More than 4 times per year    Active Member of Clubs or Organizations: Yes    Attends Engineer, structural: More than 4 times per year    Marital Status: Married    Family History  Problem Relation Age of Onset   Cancer Mother        Pancreatic   Diabetes Mother    Hypertension Mother    Depression Mother    Hypertension Father    Alcoholism Father     Past Medical History:  Diagnosis Date   ADD (attention deficit disorder)    Anemia    Anxiety    Arthritis    "left shoulder" (07/07/2014)   Back pain    Breast cancer (HCC)    "left"   Depression    Fatty liver    Food allergy    shellfish   GERD (gastroesophageal reflux disease)    "takes over the counters as needed"   Hernia, umbilical    High cholesterol    Hypertension 11/26/2011   sees Dr. Velna Hatchet   Hypothyroidism    Incisional hernia without obstruction or gangrene 06/05/2018   Joint pain    Migraines    "maybe once q 3 months" (07/07/2014)   Multinodular thyroid 2015   OSA on CPAP    Personal history of  radiation therapy    Pneumonia    May 2018   Sepsis due to Streptococcus pneumoniae (HCC)    Septic shock (HCC)    Severe sepsis (HCC) 06/05/2019   Thyroid cancer (HCC)    2015   Type II diabetes mellitus (HCC)  Vitamin D deficiency     Past Surgical History:  Procedure Laterality Date   ABDOMINAL HYSTERECTOMY  ? 1997   BREAST BIOPSY Left 2020   BREAST EXCISIONAL BIOPSY Right 2014   BREAST LUMPECTOMY Left 2009   BREAST REDUCTION SURGERY Bilateral    DIAPHRAGM SURGERY  1986   "trauma"   INCISIONAL HERNIA REPAIR N/A 06/05/2018   Procedure: LAPAROSCOPIC INCISIONAL HERNIA ERAS PATHWAY;  Surgeon: Harriette Bouillon, MD;  Location: [facility] OR;  Service: General;  Laterality: N/A;   INSERTION OF MESH N/A 06/05/2018   Procedure: INSERTION OF MESH;  Surgeon: Harriette Bouillon, MD;  Location: [facility] OR;  Service: General;  Laterality: N/A;   PARTIAL MASTECTOMY WITH NEEDLE LOCALIZATION  08/12/2012   Procedure: PARTIAL MASTECTOMY WITH NEEDLE LOCALIZATION;  Surgeon: Clovis Pu. Cornett, MD;  Location: [facility] OR;  Service: General;  Laterality: Right;   REDUCTION MAMMAPLASTY Bilateral 1995   THYROIDECTOMY Left 07/07/2014   Procedure: LEFT HEMI-THYROIDECTOMY;  Surgeon: Darletta Moll, MD;  Location: [facility];  Service: ENT;  Laterality: Left;   THYROIDECTOMY Right 07/08/2014   Procedure: Completion THYROIDECTOMY;  Surgeon: Darletta Moll, MD;  Location: [facility] OR;  Service: ENT;  Laterality: Right;   THYROIDECTOMY, PARTIAL Left 07/07/2014   hemi     Current Outpatient Medications on File Prior to Visit  Medication Sig Dispense Refill   amphetamine-dextroamphetamine (ADDERALL XR) 20 MG 24 hr capsule Take 20 mg by mouth daily. 1 per day with the 10 mg and 30 mg xr     amphetamine-dextroamphetamine (ADDERALL XR) 20 MG 24 hr capsule Take 1 capsule (20 mg total) by mouth daily. 30 capsule 0   amphetamine-dextroamphetamine (ADDERALL XR) 20 MG 24 hr capsule Take 1 capsule by mouth daily; may be filled 30 days after date prescribed  30 capsule 0   amphetamine-dextroamphetamine (ADDERALL XR) 20 MG 24 hr capsule Take 1 capsule by mouth daily; may be filled 60 days after date prescribed 30 capsule 0   amphetamine-dextroamphetamine (ADDERALL XR) 30 MG 24 hr capsule Take 30 mg by mouth daily. Take 1 capsule with the 10 mg and the 20 mg xr     amphetamine-dextroamphetamine (ADDERALL XR) 30 MG 24 hr capsule Take 1 capsule by mouth daily; may be filled 60 days after date prescribed 30 capsule 0   amphetamine-dextroamphetamine (ADDERALL XR) 30 MG 24 hr capsule Take 1 capsule by mouth daily; may be filled 30 days after date prescribed 30 capsule 0   amphetamine-dextroamphetamine (ADDERALL XR) 30 MG 24 hr capsule Take 1 capsule (30 mg total) by mouth daily. 30 capsule 0   amphetamine-dextroamphetamine (ADDERALL) 10 MG tablet Take 10 mg by mouth daily with breakfast. Take 1 per day with the 20 mg xr and the 30 mg xr     amphetamine-dextroamphetamine (ADDERALL) 10 MG tablet Take 1 tablet by mouth daily in the afternoon. May be filled 60 days after date prescribed 30 tablet 0   amphetamine-dextroamphetamine (ADDERALL) 10 MG tablet Take 1 tablet by mouth daily in the afternoon 30 tablet 0   amphetamine-dextroamphetamine (ADDERALL) 10 MG tablet Take 1 tablet by mouth daily in the afternoon. May be filled 30 days after date prescribed 30 tablet 0   cholecalciferol (VITAMIN D3) 25 MCG (1000 UT) tablet Take 1,000 Units by mouth daily.     Cyanocobalamin (VITAMIN B-12 PO) Take 1 tablet by mouth once a week. Tuesdays     diazepam (VALIUM) 5 MG tablet TAKE 1 TABLET BY MOUTH EVERY DAY AS NEEDED 20 tablet  0   diltiazem (CARTIA XT) 240 MG 24 hr capsule Take 1 capsule (240 mg total) by mouth daily. 90 capsule 2   EPINEPHrine 0.3 mg/0.3 mL IJ SOAJ injection Inject 0.3 mg into the muscle once as needed. Allergic reaction 1 each 3   famotidine (PEPCID) 20 MG tablet Take 20 mg by mouth as needed for heartburn or indigestion.     levocetirizine (XYZAL) 5 MG  tablet TAKE 1 TABLET BY MOUTH EVERY DAY IN THE EVENING 90 tablet 1   Levothyroxine Sodium 175 MCG/ML SOLN Take 175 mcg by mouth daily. 30 mL 5   Magnesium 500 MG CAPS Take 500 mg by mouth daily.     pravastatin (PRAVACHOL) 80 MG tablet TAKE ONE TABLET BY MOUTH MON-SUNDAY 75 tablet 2   promethazine (PHENERGAN) 25 MG tablet TAKE 1 TABLET BY MOUTH EVERY 8 HOURS AS NEEDED 30 tablet 0   Rimegepant Sulfate (NURTEC) 75 MG TBDP Take 1 tablet (75 mg total) by mouth daily as needed. 10 tablet 2   Semaglutide, 2 MG/DOSE, (OZEMPIC, 2 MG/DOSE,) 8 MG/3ML SOPN INJECT 2 MG INTO THE SKIN ONCE A WEEK. (Patient not taking: Reported on 05/29/2023) 3 mL 3   valsartan-hydrochlorothiazide (DIOVAN-HCT) 160-25 MG tablet TAKE 1 TABLET BY MOUTH EVERY DAY 90 tablet 1   vitamin C (ASCORBIC ACID) 500 MG tablet Take 500 mg by mouth daily.     vortioxetine HBr (TRINTELLIX) 20 MG TABS tablet Take 1 tablet (20 mg total) by mouth daily. 90 tablet 1   XIGDUO XR 11-998 MG TB24 TAKE 1 TABLET BY MOUTH EVERY DAY 30 tablet 1   No current facility-administered medications on file prior to visit.    Allergies  Allergen Reactions   Crab [Shellfish Allergy] Hives and Swelling    "Only when I eat too many crab legs" Can tolerate CT scans with contrast-does not need premeds       DIAGNOSTIC DATA (LABS, IMAGING, TESTING) - I reviewed patient records, labs, notes, testing and imaging myself where available.  Lab Results  Component Value Date   WBC 7.3 02/22/2023   HGB 12.9 02/22/2023   HCT 41.7 02/22/2023   MCV 75 (L) 02/22/2023   PLT 370 02/22/2023      Component Value Date/Time   NA 144 02/22/2023 1520   NA 143 05/28/2013 0844   K 4.0 02/22/2023 1520   K 4.4 05/28/2013 0844   CL 99 02/22/2023 1520   CL 99 11/20/2012 0940   CO2 23 02/22/2023 1520   CO2 28 05/28/2013 0844   GLUCOSE 88 02/22/2023 1520   GLUCOSE 94 06/15/2019 0234   GLUCOSE 94 05/28/2013 0844   GLUCOSE 142 (H) 11/20/2012 0940   BUN 17 02/22/2023 1520    BUN 16.9 05/28/2013 0844   CREATININE 1.06 (H) 02/22/2023 1520   CREATININE 0.8 05/28/2013 0844   CALCIUM 10.0 02/22/2023 1520   CALCIUM 9.8 05/28/2013 0844   PROT 7.6 02/22/2023 1520   PROT 7.9 05/28/2013 0844   ALBUMIN 4.5 02/22/2023 1520   ALBUMIN 3.7 05/28/2013 0844   AST 23 02/22/2023 1520   AST 15 05/28/2013 0844   ALT 26 02/22/2023 1520   ALT 23 05/28/2013 0844   ALKPHOS 65 02/22/2023 1520   ALKPHOS 58 05/28/2013 0844   BILITOT <0.2 02/22/2023 1520   BILITOT 0.28 05/28/2013 0844   GFRNONAA 71 08/10/2020 1457   GFRAA 81 08/10/2020 1457   Lab Results  Component Value Date   CHOL 157 02/22/2023   HDL 49 02/22/2023  LDLCALC 85 02/22/2023   TRIG 129 02/22/2023   CHOLHDL 3.2 02/22/2023   Lab Results  Component Value Date   HGBA1C 7.4 (H) 02/22/2023   Lab Results  Component Value Date   VITAMINB12 1,588 (H) 08/10/2020   Lab Results  Component Value Date   TSH 0.021 (L) 02/22/2023    PHYSICAL EXAM:  Today's Vitals   06/27/23 0838  BP: 138/80  Pulse: 82  Weight: 208 lb (94.3 kg)  Height: 5\' 4"  (1.626 m)   Body mass index is 35.7 kg/m.   Wt Readings from Last 3 Encounters:  06/27/23 208 lb (94.3 kg)  05/29/23 203 lb 9.6 oz (92.4 kg)  02/22/23 198 lb 3.2 oz (89.9 kg)     Ht Readings from Last 3 Encounters:  06/27/23 5\' 4"  (1.626 m)  05/29/23 5\' 4"  (1.626 m)  01/30/23 5\' 4"  (1.626 m)      General: The patient is awake, alert and appears not in acute distress. The patient is well groomed. Head: Normocephalic, atraumatic. Neck is supple. Mallampati 3 plus  neck circumference:16. Nasal airflow patent .  Retrognathia is seen.  Cardiovascular:  Regular rate and rhythm- without  murmurs or carotid bruit, and without distended neck veins. Respiratory: Lungs are clear to auscultation. Skin:  Without evidence of edema, or rash Trunk: abdominal weight.    Neurologic exam : The patient is awake and alert, oriented to place and time.   Attention span &  concentration ability appears normal. She has problems multitasking  Speech is fluent,  without dysarthria, but with dysphonia . Mood and affect are appropriate.   Cranial nerves: Pupils are equal and briskly reactive to light.  Facial sensation intact to fine touch.  Facial motor strength is symmetric and tongue in midline. Shoulder shrug was symmetrical.  Deep tendon reflexes: in the upper and lower extremities are symmetric and intact. Babinski maneuver response is downgoing.     ASSESSMENT AND PLAN 62 y.o. year old female  here with: DX was confirmed : severe OSA.     1) Caregiver burn out, obesity and lack of sleep routines were addressed today.   2) Continuation of CPAP care is my main recommendation.  We can recheck for alternative therapies once weight loss is achieved.    I plan to follow up through our NP within 12 months.   I would like to thank Dorothyann Peng, MD and Dorothyann Peng, Md [address] [place],  [place] [address] for allowing me to meet with and to take care of this pleasant patient.     After spending a total time of  20  minutes face to face and additional time for physical and neurologic examination, review of laboratory studies,  personal review of imaging studies, reports and results of other testing and review of referral information / records as far as provided in visit,   Electronically signed by: Melvyn Novas, MD 06/27/2023 8:42 AM  [facility] Board certified by The [employer] of Sleep Medicine and Diplomate of the [employer] of Sleep Medicine. Board certified In Neurology through the ABPN, Fellow of the [employer] of Neu[employer].

## 2023-07-05 ENCOUNTER — Other Ambulatory Visit: Payer: Self-pay

## 2023-07-11 LAB — HM DIABETES EYE EXAM

## 2023-07-15 ENCOUNTER — Other Ambulatory Visit (HOSPITAL_COMMUNITY): Payer: Self-pay

## 2023-07-15 ENCOUNTER — Other Ambulatory Visit: Payer: Self-pay | Admitting: Internal Medicine

## 2023-07-20 ENCOUNTER — Other Ambulatory Visit (HOSPITAL_COMMUNITY): Payer: Self-pay

## 2023-07-22 ENCOUNTER — Ambulatory Visit: Payer: 59 | Admitting: Internal Medicine

## 2023-07-22 NOTE — Progress Notes (Deleted)
 I,Windom Antolin T Emmitt, CMA,acting as a neurosurgeon for Catheryn LOISE Slocumb, MD.,have documented all relevant documentation on the behalf of Catheryn LOISE Slocumb, MD,as directed by  Catheryn LOISE Slocumb, MD while in the presence of Catheryn LOISE Slocumb, MD.  Subjective:  Patient ID: Brittany Rubio , female    DOB: 21-Feb-1961 , 63 y.o.   MRN: [id number]  No chief complaint on file.   HPI  Pt presents today for DM, HTN & CHOL check. She reports compliance with meds. Denies headaches, chest pain and shortness of breath.   She reports not yet scheduling DM eye exam.  Letter sent to Dr Madolyn Monte for pap result.     Diabetes She presents for her follow-up diabetic visit. She has type 2 diabetes mellitus. Her disease course has been stable. Pertinent negatives for hypoglycemia include no dizziness. Pertinent negatives for diabetes include no blurred vision, no polydipsia, no polyphagia and no polyuria. There are no hypoglycemic complications. Diabetic complications include nephropathy. Risk factors for coronary artery disease include diabetes mellitus, dyslipidemia, hypertension, obesity, sedentary lifestyle, post-menopausal and stress. Her weight is fluctuating minimally. She is following a diabetic diet.  Hypertension This is a chronic problem. The current episode started more than 1 year ago. The problem has been gradually improving since onset. The problem is uncontrolled. Pertinent negatives include no blurred vision or palpitations.     Past Medical History:  Diagnosis Date   ADD (attention deficit disorder)    Anemia    Anxiety    Arthritis    left shoulder (07/07/2014)   Back pain    Breast cancer (HCC)    left   Depression    Fatty liver    Food allergy    shellfish   GERD (gastroesophageal reflux disease)    takes over the counters as needed   Hernia, umbilical    High cholesterol    Hypertension 11/26/2011   sees Dr. Grayce Slocumb   Hypothyroidism    Incisional hernia without  obstruction or gangrene 06/05/2018   Joint pain    Migraines    maybe once q 3 months (07/07/2014)   Multinodular thyroid  2015   OSA on CPAP    Personal history of radiation therapy    Pneumonia    May 2018   Sepsis due to Streptococcus pneumoniae (HCC)    Septic shock (HCC)    Severe sepsis (HCC) 06/05/2019   Thyroid  cancer (HCC)    2015   Type II diabetes mellitus (HCC)    Vitamin D  deficiency      Family History  Problem Relation Age of Onset   Cancer Mother        Pancreatic   Diabetes Mother    Hypertension Mother    Depression Mother    Hypertension Father    Alcoholism Father      Current Outpatient Medications:    amphetamine -dextroamphetamine  (ADDERALL XR) 20 MG 24 hr capsule, Take 20 mg by mouth daily. 1 per day with the 10 mg and 30 mg xr, Disp: , Rfl:    amphetamine -dextroamphetamine  (ADDERALL XR) 20 MG 24 hr capsule, Take 1 capsule (20 mg total) by mouth daily., Disp: 30 capsule, Rfl: 0   amphetamine -dextroamphetamine  (ADDERALL XR) 20 MG 24 hr capsule, Take 1 capsule by mouth daily; may be filled 30 days after date prescribed, Disp: 30 capsule, Rfl: 0   amphetamine -dextroamphetamine  (ADDERALL XR) 20 MG 24 hr capsule, Take 1 capsule by mouth daily; may be filled 60 days after date prescribed,  Disp: 30 capsule, Rfl: 0   amphetamine -dextroamphetamine  (ADDERALL XR) 30 MG 24 hr capsule, Take 30 mg by mouth daily. Take 1 capsule with the 10 mg and the 20 mg xr, Disp: , Rfl:    amphetamine -dextroamphetamine  (ADDERALL XR) 30 MG 24 hr capsule, Take 1 capsule by mouth daily; may be filled 60 days after date prescribed, Disp: 30 capsule, Rfl: 0   amphetamine -dextroamphetamine  (ADDERALL XR) 30 MG 24 hr capsule, Take 1 capsule by mouth daily; may be filled 30 days after date prescribed, Disp: 30 capsule, Rfl: 0   amphetamine -dextroamphetamine  (ADDERALL XR) 30 MG 24 hr capsule, Take 1 capsule (30 mg total) by mouth daily., Disp: 30 capsule, Rfl: 0    amphetamine -dextroamphetamine  (ADDERALL) 10 MG tablet, Take 10 mg by mouth daily with breakfast. Take 1 per day with the 20 mg xr and the 30 mg xr, Disp: , Rfl:    amphetamine -dextroamphetamine  (ADDERALL) 10 MG tablet, Take 1 tablet by mouth daily in the afternoon. May be filled 60 days after date prescribed, Disp: 30 tablet, Rfl: 0   amphetamine -dextroamphetamine  (ADDERALL) 10 MG tablet, Take 1 tablet by mouth daily in the afternoon, Disp: 30 tablet, Rfl: 0   amphetamine -dextroamphetamine  (ADDERALL) 10 MG tablet, Take 1 tablet by mouth daily in the afternoon. May be filled 30 days after date prescribed, Disp: 30 tablet, Rfl: 0   cholecalciferol  (VITAMIN D3) 25 MCG (1000 UT) tablet, Take 1,000 Units by mouth daily., Disp: , Rfl:    Cyanocobalamin  (VITAMIN B-12 PO), Take 1 tablet by mouth once a week. Tuesdays, Disp: , Rfl:    diazepam  (VALIUM ) 5 MG tablet, TAKE 1 TABLET BY MOUTH EVERY DAY AS NEEDED, Disp: 20 tablet, Rfl: 0   diltiazem  (CARTIA  XT) 240 MG 24 hr capsule, Take 1 capsule (240 mg total) by mouth daily., Disp: 90 capsule, Rfl: 2   EPINEPHrine  0.3 mg/0.3 mL IJ SOAJ injection, Inject 0.3 mg into the muscle once as needed. Allergic reaction, Disp: 1 each, Rfl: 3   famotidine  (PEPCID ) 20 MG tablet, Take 20 mg by mouth as needed for heartburn or indigestion., Disp: , Rfl:    levocetirizine (XYZAL ) 5 MG tablet, TAKE 1 TABLET BY MOUTH EVERY DAY IN THE EVENING, Disp: 90 tablet, Rfl: 1   Levothyroxine  Sodium 175 MCG/ML SOLN, Take 175 mcg by mouth daily., Disp: 30 mL, Rfl: 5   Magnesium  500 MG CAPS, Take 500 mg by mouth daily., Disp: , Rfl:    pravastatin  (PRAVACHOL ) 80 MG tablet, TAKE ONE TABLET BY MOUTH MON-SUNDAY, Disp: 75 tablet, Rfl: 2   promethazine  (PHENERGAN ) 25 MG tablet, TAKE 1 TABLET BY MOUTH EVERY 8 HOURS AS NEEDED, Disp: 30 tablet, Rfl: 0   Rimegepant Sulfate (NURTEC) 75 MG TBDP, Take 1 tablet (75 mg total) by mouth daily as needed., Disp: 10 tablet, Rfl: 2   Semaglutide , 2 MG/DOSE,  (OZEMPIC , 2 MG/DOSE,) 8 MG/3ML SOPN, INJECT 2 MG INTO THE SKIN ONCE A WEEK. (Patient not taking: Reported on 05/29/2023), Disp: 3 mL, Rfl: 3   valsartan -hydrochlorothiazide  (DIOVAN -HCT) 160-25 MG tablet, TAKE 1 TABLET BY MOUTH EVERY DAY, Disp: 90 tablet, Rfl: 1   vitamin C  (ASCORBIC ACID ) 500 MG tablet, Take 500 mg by mouth daily., Disp: , Rfl:    vortioxetine  HBr (TRINTELLIX ) 20 MG TABS tablet, Take 1 tablet (20 mg total) by mouth daily., Disp: 90 tablet, Rfl: 1   XIGDUO  XR 11-998 MG TB24, TAKE 1 TABLET BY MOUTH EVERY DAY, Disp: 30 tablet, Rfl: 1   Allergies  Allergen  Reactions   Crab [Shellfish Allergy] Hives and Swelling    Only when I eat too many crab legs Can tolerate CT scans with contrast-does not need premeds       Review of Systems  Constitutional: Negative.   Eyes:  Negative for blurred vision.  Respiratory: Negative.    Cardiovascular: Negative.  Negative for palpitations.  Endocrine: Negative for polydipsia, polyphagia and polyuria.  Neurological: Negative.  Negative for dizziness.  Psychiatric/Behavioral: Negative.       There were no vitals filed for this visit. There is no height or weight on file to calculate BMI.  Wt Readings from Last 3 Encounters:  06/27/23 208 lb (94.3 kg)  05/29/23 203 lb 9.6 oz (92.4 kg)  02/22/23 198 lb 3.2 oz (89.9 kg)     Objective:  Physical Exam      Assessment And Plan:  Type 2 diabetes mellitus with stage 2 chronic kidney disease, without long-term current use of insulin  (HCC)  Hypertensive nephropathy  Migraine without aura and without status migrainosus, not intractable  Anxiety  Pure hypercholesterolemia     No follow-ups on file.  Patient was given opportunity to ask questions. Patient verbalized understanding of the plan and was able to repeat key elements of the plan. All questions were answered to their satisfaction.  Catheryn LOISE Slocumb, MD  I, Catheryn LOISE Slocumb, MD, have reviewed all documentation for this  visit. The documentation on 07/22/23 for the exam, diagnosis, procedures, and orders are all accurate and complete.   IF YOU HAVE BEEN REFERRED TO A SPECIALIST, IT MAY TAKE 1-2 WEEKS TO SCHEDULE/PROCESS THE REFERRAL. IF YOU HAVE NOT HEARD FROM US /SPECIALIST IN TWO WEEKS, PLEASE GIVE US  A CALL AT [phone].   THE PATIENT IS ENCOURAGED TO PRACTICE SOCIAL DISTANCING DUE TO THE COVID-19 PANDEMIC.

## 2023-07-24 ENCOUNTER — Encounter: Payer: Self-pay | Admitting: Neurology

## 2023-08-04 ENCOUNTER — Other Ambulatory Visit: Payer: Self-pay | Admitting: Internal Medicine

## 2023-08-13 ENCOUNTER — Other Ambulatory Visit: Payer: Self-pay | Admitting: Internal Medicine

## 2023-08-23 ENCOUNTER — Other Ambulatory Visit (HOSPITAL_COMMUNITY): Payer: Self-pay

## 2023-08-24 ENCOUNTER — Other Ambulatory Visit (HOSPITAL_COMMUNITY): Payer: Self-pay

## 2023-09-02 ENCOUNTER — Encounter: Payer: Self-pay | Admitting: Internal Medicine

## 2023-09-02 ENCOUNTER — Other Ambulatory Visit: Payer: Self-pay

## 2023-09-02 ENCOUNTER — Ambulatory Visit: Payer: 59 | Admitting: Internal Medicine

## 2023-09-02 VITALS — BP 128/84 | HR 86 | Temp 97.8°F | Ht 64.0 in | Wt 216.6 lb

## 2023-09-02 DIAGNOSIS — G43009 Migraine without aura, not intractable, without status migrainosus: Secondary | ICD-10-CM

## 2023-09-02 DIAGNOSIS — E890 Postprocedural hypothyroidism: Secondary | ICD-10-CM | POA: Diagnosis not present

## 2023-09-02 DIAGNOSIS — Z23 Encounter for immunization: Secondary | ICD-10-CM

## 2023-09-02 DIAGNOSIS — F419 Anxiety disorder, unspecified: Secondary | ICD-10-CM

## 2023-09-02 DIAGNOSIS — I129 Hypertensive chronic kidney disease with stage 1 through stage 4 chronic kidney disease, or unspecified chronic kidney disease: Secondary | ICD-10-CM | POA: Diagnosis not present

## 2023-09-02 DIAGNOSIS — N182 Chronic kidney disease, stage 2 (mild): Secondary | ICD-10-CM

## 2023-09-02 DIAGNOSIS — E1122 Type 2 diabetes mellitus with diabetic chronic kidney disease: Secondary | ICD-10-CM | POA: Diagnosis not present

## 2023-09-02 DIAGNOSIS — Z6837 Body mass index (BMI) 37.0-37.9, adult: Secondary | ICD-10-CM

## 2023-09-02 DIAGNOSIS — E66812 Obesity, class 2: Secondary | ICD-10-CM

## 2023-09-02 DIAGNOSIS — F5104 Psychophysiologic insomnia: Secondary | ICD-10-CM

## 2023-09-02 MED ORDER — OZEMPIC (2 MG/DOSE) 8 MG/3ML ~~LOC~~ SOPN: 2.0000 mg | PEN_INJECTOR | SUBCUTANEOUS | 3 refills | Status: DC

## 2023-09-02 MED ORDER — MOUNJARO 5 MG/0.5ML ~~LOC~~ SOAJ: 5.0000 mg | SUBCUTANEOUS | 1 refills | Status: DC

## 2023-09-02 MED ORDER — DAPAGLIFLOZIN PRO-METFORMIN ER 5-1000 MG PO TB24: 1.0000 | ORAL_TABLET | Freq: Every day | ORAL | 1 refills | Status: DC

## 2023-09-02 NOTE — Progress Notes (Signed)
 I,Victoria T Deloria Lair, CMA,acting as a Neurosurgeon for [name], MD.,have documented all relevant documentation on the behalf of [name], MD,as directed by  [name], MD while in the presence of [name], MD.  Subjective:  Patient ID: Brittany Rubio , female    DOB: August 30, 1960 , 63 y.o.   MRN: [id number]  Chief Complaint  Patient presents with   Diabetes   Hypertension    HPI  Pt presents today for DM/HTN check. She admits she is not 100% compliant with meds.   Denies headaches, chest pain and shortness of breath. She has no specific questions or concerns.   Diabetes She presents for her follow-up diabetic visit. She has type 2 diabetes mellitus. Her disease course has been stable. Pertinent negatives for hypoglycemia include no dizziness. Pertinent negatives for diabetes include no blurred vision, no polydipsia, no polyphagia and no polyuria. There are no hypoglycemic complications. Diabetic complications include nephropathy. Risk factors for coronary artery disease include diabetes mellitus, dyslipidemia, hypertension, obesity, sedentary lifestyle, post-menopausal and stress. Her weight is fluctuating minimally. She is following a diabetic diet. She participates in exercise intermittently. An ACE inhibitor/angiotensin II receptor blocker is being taken. She does not see a podiatrist. Hypertension This is a chronic problem. The current episode started more than 1 year ago. The problem has been gradually improving since onset. The problem is uncontrolled. Pertinent negatives include no blurred vision or palpitations. Risk factors for coronary artery disease include diabetes mellitus, dyslipidemia and post-menopausal state. Past treatments include angiotensin blockers and diuretics.     Past Medical History:  Diagnosis Date   ADD (attention deficit disorder)    Anemia    Anxiety    Arthritis    "left shoulder" (07/07/2014)   Back pain    Breast cancer (HCC)     "left"   Depression    Fatty liver    Food allergy    shellfish   GERD (gastroesophageal reflux disease)    "takes over the counters as needed"   Hernia, umbilical    High cholesterol    Hypertension 11/26/2011   sees Dr. Velna Hatchet   Hypothyroidism    Incisional hernia without obstruction or gangrene 06/05/2018   Joint pain    Migraines    "maybe once q 3 months" (07/07/2014)   Multinodular thyroid 2015   OSA on CPAP    Personal history of radiation therapy    Pneumonia    May 2018   Sepsis due to Streptococcus pneumoniae (HCC)    Septic shock (HCC)    Severe sepsis (HCC) 06/05/2019   Thyroid cancer (HCC)    2015   Type II diabetes mellitus (HCC)    Vitamin D deficiency      Family History  Problem Relation Age of Onset   Cancer Mother        Pancreatic   Diabetes Mother    Hypertension Mother    Depression Mother    Hypertension Father    Alcoholism Father      Current Outpatient Medications:    amphetamine-dextroamphetamine (ADDERALL XR) 20 MG 24 hr capsule, Take 20 mg by mouth daily. 1 per day with the 10 mg and 30 mg xr, Disp: , Rfl:    amphetamine-dextroamphetamine (ADDERALL) 10 MG tablet, Take 1 tablet by mouth daily in the afternoon. May be filled 30 days after date prescribed, Disp: 30 tablet, Rfl: 0   cholecalciferol (VITAMIN D3) 25 MCG (1000 UT) tablet, Take 1,000 Units  by mouth daily., Disp: , Rfl:    Cyanocobalamin (VITAMIN B-12 PO), Take 1 tablet by mouth once a week. Tuesdays, Disp: , Rfl:    diazepam (VALIUM) 5 MG tablet, TAKE 1 TABLET BY MOUTH EVERY DAY AS NEEDED, Disp: 20 tablet, Rfl: 0   diltiazem (CARTIA XT) 240 MG 24 hr capsule, Take 1 capsule (240 mg total) by mouth daily., Disp: 90 capsule, Rfl: 2   EPINEPHrine 0.3 mg/0.3 mL IJ SOAJ injection, Inject 0.3 mg into the muscle once as needed. Allergic reaction, Disp: 1 each, Rfl: 3   eszopiclone (LUNESTA) 2 MG TABS tablet, Take 1 tablet (2 mg total) by mouth at bedtime. Take immediately before  bedtime, Disp: 30 tablet, Rfl: 1   famotidine (PEPCID) 20 MG tablet, Take 20 mg by mouth as needed for heartburn or indigestion., Disp: , Rfl:    levocetirizine (XYZAL) 5 MG tablet, TAKE 1 TABLET BY MOUTH EVERY DAY IN THE EVENING, Disp: 90 tablet, Rfl: 1   Levothyroxine Sodium 175 MCG/ML SOLN, Take 175 mcg by mouth daily., Disp: 30 mL, Rfl: 5   Magnesium 500 MG CAPS, Take 500 mg by mouth daily., Disp: , Rfl:    pravastatin (PRAVACHOL) 80 MG tablet, TAKE ONE TABLET BY MOUTH MON-SUNDAY, Disp: 75 tablet, Rfl: 2   promethazine (PHENERGAN) 25 MG tablet, TAKE 1 TABLET BY MOUTH EVERY 8 HOURS AS NEEDED, Disp: 30 tablet, Rfl: 0   Rimegepant Sulfate (NURTEC) 75 MG TBDP, Take 1 tablet (75 mg total) by mouth daily as needed., Disp: 10 tablet, Rfl: 2   tirzepatide (MOUNJARO) 5 MG/0.5ML Pen, Inject 5 mg into the skin once a week., Disp: 2 mL, Rfl: 1   TRINTELLIX 20 MG TABS tablet, TAKE 1 TABLET BY MOUTH EVERY DAY, Disp: 90 tablet, Rfl: 1   valsartan-hydrochlorothiazide (DIOVAN-HCT) 160-25 MG tablet, TAKE 1 TABLET BY MOUTH EVERY DAY, Disp: 90 tablet, Rfl: 1   vitamin C (ASCORBIC ACID) 500 MG tablet, Take 500 mg by mouth daily., Disp: , Rfl:    Dapagliflozin Pro-metFORMIN ER (XIGDUO XR) 11-998 MG TB24, Take 1 tablet by mouth daily., Disp: 30 tablet, Rfl: 1   Allergies  Allergen Reactions   Crab [Shellfish Allergy] Hives and Swelling    "Only when I eat too many crab legs" Can tolerate CT scans with contrast-does not need premeds       Review of Systems  Constitutional: Negative.   Eyes:  Negative for blurred vision.  Respiratory: Negative.    Cardiovascular: Negative.  Negative for palpitations.  Gastrointestinal: Negative.  Negative for abdominal distention.  Endocrine: Negative for polydipsia, polyphagia and polyuria.  Neurological: Negative.  Negative for dizziness.  Psychiatric/Behavioral: Negative.       Today's Vitals   09/02/23 1452  BP: 128/84  Pulse: 86  Temp: 97.8 F (36.6 C)  SpO2:  98%  Weight: 216 lb 9.6 oz (98.2 kg)  Height: 5\' 4"  (1.626 m)   Body mass index is 37.18 kg/m.  Wt Readings from Last 3 Encounters:  09/02/23 216 lb 9.6 oz (98.2 kg)  06/27/23 208 lb (94.3 kg)  05/29/23 203 lb 9.6 oz (92.4 kg)     Objective:  Physical Exam Vitals and nursing note reviewed.  Constitutional:      Appearance: Normal appearance. She is obese.  HENT:     Head: Normocephalic and atraumatic.  Eyes:     Extraocular Movements: Extraocular movements intact.  Cardiovascular:     Rate and Rhythm: Normal rate and regular rhythm.  Heart sounds: Normal heart sounds.  Pulmonary:     Effort: Pulmonary effort is normal.     Breath sounds: Normal breath sounds.  Musculoskeletal:     Cervical back: Normal range of motion.  Skin:    General: Skin is warm.  Neurological:     General: No focal deficit present.     Mental Status: She is alert.  Psychiatric:        Mood and Affect: Mood normal.        Behavior: Behavior normal.         Assessment And Plan:  Type 2 diabetes mellitus with stage 2 chronic kidney disease, without long-term current use of insulin (HCC) Assessment & Plan: Chronic, she will f/u in 3-4 months. Importance of medication/dietary compliance was discussed with the patient. I will switch her to [facility], she may have greater weight loss benefit.  I will start 5mg  weekly dose.  She will continue with Xigduo XR 5/1000mg  daily. She is encouraged to stay well hydrated.   Orders: -     Hemoglobin A1c -     BMP8+EGFR  Hypertensive nephropathy Assessment & Plan: Chronic, fair control. Goal BP<120/80.  She will continue with valsartan160/25mg  daily and Tiadylt ER 240mg  daily.  Encouraged to follow low sodium diet.    Postoperative hypothyroidism Assessment & Plan: She is currently taking Levothyroxine daily.  She is also followed by Endo.  I will check thyroid panel and adjust meds as needed.   Orders: -     TSH + free T4  Chronic  insomnia Assessment & Plan: I will send rx Lunesta 2mg  nightly prn.  She is encouraged to develop a bedtime routine.     Class 2 severe obesity due to excess calories with serious comorbidity and body mass index (BMI) of 37.0 to 37.9 in adult [facility]) Assessment & Plan: She has gained 8 lbs since December. I do believe she has gained weight; however, we have a new scale, so the amount likely differs.  She is encouraged to strive for BMI less than 30 to decrease cardiac risk. Advised to aim for at least 150 minutes of exercise per week.    Immunization due -     Pneumococcal conjugate vaccine 20-valent -     Tdap vaccine greater than or equal to 7yo IM  Other orders -     Mounjaro; Inject 5 mg into the skin once a week.  Dispense: 2 mL; Refill: 1 -     Eszopiclone; Take 1 tablet (2 mg total) by mouth at bedtime. Take immediately before bedtime  Dispense: 30 tablet; Refill: 1  She is encouraged to strive for BMI less than 30 to decrease cardiac risk. Advised to aim for at least 150 minutes of exercise per week.    Return in 3 months (on 11/30/2023), or dm check.  Patient was given opportunity to ask questions. Patient verbalized understanding of the plan and was able to repeat key elements of the plan. All questions were answered to their satisfaction.    I, [name], MD, have reviewed all documentation for this visit. The documentation on 09/07/23 for the exam, diagnosis, procedures, and orders are all accurate and complete.   IF YOU HAVE BEEN REFERRED TO A SPECIALIST, IT MAY TAKE 1-2 WEEKS TO SCHEDULE/PROCESS THE REFERRAL. IF YOU HAVE NOT HEARD FROM US/SPECIALIST IN TWO WEEKS, PLEASE GIVE [place] A CALL AT [phone] X [phone].   THE PATIENT IS ENCOURAGED TO PRACTICE SOCIAL DISTANCING DUE TO  THE COVID-19 PANDEMIC.

## 2023-09-02 NOTE — Patient Instructions (Signed)

## 2023-09-02 NOTE — Assessment & Plan Note (Addendum)
 Chronic, she will f/u in 3-4 months. Importance of medication/dietary compliance was discussed with the patient. I will switch her to [facility], she may have greater weight loss benefit.  I will start 5mg  weekly dose.  She will continue with Xigduo XR 5/1000mg  daily. She is encouraged to stay well hydrated.

## 2023-09-03 LAB — TSH+FREE T4
Free T4: 2.1 ng/dL — ABNORMAL HIGH (ref 0.82–1.77)
TSH: 0.81 u[IU]/mL (ref 0.450–4.500)

## 2023-09-03 LAB — BMP8+EGFR
BUN/Creatinine Ratio: 21 (ref 12–28)
BUN: 22 mg/dL (ref 8–27)
CO2: 25 mmol/L (ref 20–29)
Calcium: 9.9 mg/dL (ref 8.7–10.3)
Chloride: 97 mmol/L (ref 96–106)
Creatinine, Ser: 1.04 mg/dL — ABNORMAL HIGH (ref 0.57–1.00)
Glucose: 215 mg/dL — ABNORMAL HIGH (ref 70–99)
Potassium: 4.8 mmol/L (ref 3.5–5.2)
Sodium: 140 mmol/L (ref 134–144)
eGFR: 60 mL/min/{1.73_m2} (ref >59)

## 2023-09-03 LAB — HEMOGLOBIN A1C
Est. average glucose Bld gHb Est-mCnc: 217 mg/dL
Hgb A1c MFr Bld: 9.2 % — ABNORMAL HIGH (ref 4.8–5.6)

## 2023-09-07 MED ORDER — ESZOPICLONE 2 MG PO TABS: 2.0000 mg | ORAL_TABLET | Freq: Every day | ORAL | 1 refills | Status: DC

## 2023-09-07 NOTE — Assessment & Plan Note (Signed)
 She is currently taking Levothyroxine daily.  She is also followed by Endo.  I will check thyroid panel and adjust meds as needed.

## 2023-09-07 NOTE — Assessment & Plan Note (Addendum)
 I will send rx Lunesta 2mg  nightly prn.  She is encouraged to develop a bedtime routine.

## 2023-09-07 NOTE — Assessment & Plan Note (Signed)
 Chronic, fair control. Goal BP<120/80.  She will continue with valsartan160/25mg  daily and Tiadylt ER 240mg  daily.  Encouraged to follow low sodium diet.

## 2023-09-07 NOTE — Assessment & Plan Note (Signed)
 She has gained 8 lbs since December. I do believe she has gained weight; however, we have a new scale, so the amount likely differs.  She is encouraged to strive for BMI less than 30 to decrease cardiac risk. Advised to aim for at least 150 minutes of exercise per week.

## 2023-09-11 ENCOUNTER — Other Ambulatory Visit: Payer: Self-pay | Admitting: Internal Medicine

## 2023-09-23 ENCOUNTER — Other Ambulatory Visit: Payer: Self-pay

## 2023-09-23 ENCOUNTER — Other Ambulatory Visit: Payer: Self-pay | Admitting: Internal Medicine

## 2023-09-23 DIAGNOSIS — F419 Anxiety disorder, unspecified: Secondary | ICD-10-CM

## 2023-09-23 MED ORDER — DIAZEPAM 5 MG PO TABS: ORAL_TABLET | ORAL | 0 refills | Status: DC

## 2023-09-23 MED ORDER — MOUNJARO 5 MG/0.5ML ~~LOC~~ SOAJ: 5.0000 mg | SUBCUTANEOUS | 1 refills | Status: DC

## 2023-09-30 ENCOUNTER — Other Ambulatory Visit: Payer: Self-pay | Admitting: Internal Medicine

## 2023-09-30 ENCOUNTER — Telehealth: Payer: Self-pay | Admitting: Internal Medicine

## 2023-09-30 NOTE — Telephone Encounter (Signed)
 [facility] called and spoke to [place], [facility] about the refill(s) diazepam requested. Advised it was sent on 09/23/23 #20/0 refill(s). She says it's on hold and all the patient had to do is call and get it activated. She says it will be ready in about 2 hours and the patient should receive a notification when it's ready. Patient called, recording call could not be completed as dialed. If she or husband return the call, notify of the above.

## 2023-09-30 NOTE — Telephone Encounter (Signed)
 Last Fill: 12/25/22  Last OV: 09/02/23 Next OV: 12/02/23  Routing to provider for review/authorization.

## 2023-09-30 NOTE — Telephone Encounter (Signed)
 Copied from CRM [id number]. Topic: Clinical - Medication Refill >> Sep 30, 2023  4:49 PM Shon Hale wrote: Most Recent Primary Care Visit:  Provider: Dorothyann Peng  Department: [facility]  Visit Type: OFFICE VISIT  Date: 09/02/2023  Medication: promethazine (PHENERGAN) 25 MG tablet  Has the patient contacted their pharmacy? Yes Told to contact the office.  Is this the correct pharmacy for this prescription? Yes This is the patient's preferred pharmacy:  [id number] [place], [place] - [address] AT [facility] [address] [place] [address] Phone: [phone] Fax: [phone]   Has the prescription been filled recently? No  Is the patient out of the medication? Yes  Has the patient been seen for an appointment in the last year OR does the patient have an upcoming appointment? Yes  Can we respond through MyChart? No  Agent: Please be advised that Rx refills may take up to 3 business days. We ask that you follow-up with your pharmacy.

## 2023-09-30 NOTE — Telephone Encounter (Signed)
 Copied from CRM [id number]. Topic: Clinical - Prescription Issue >> Sep 30, 2023  4:47 PM Shon Hale wrote: Reason for CRM: Patient unable to get diazepam (VALIUM) 5 MG tablet from pharmacy. Showing sent to pharmacy on 09/23/2023, patient's husband has gone to pharmacy and rx not available.   Please assist patient further Best callback #: [phone]

## 2023-10-01 MED ORDER — PROMETHAZINE HCL 25 MG PO TABS: ORAL_TABLET | ORAL | 0 refills | Status: DC

## 2023-10-07 ENCOUNTER — Other Ambulatory Visit (HOSPITAL_COMMUNITY): Payer: Self-pay

## 2023-10-08 ENCOUNTER — Other Ambulatory Visit (HOSPITAL_COMMUNITY): Payer: Self-pay

## 2023-10-08 MED ORDER — AMPHETAMINE-DEXTROAMPHETAMINE 10 MG PO TABS
10.0000 mg | ORAL_TABLET | Freq: Every day | ORAL | 0 refills | Status: DC
  Filled 2023-10-08 – 2024-03-19 (×2): qty 30, 30d supply, fill #0

## 2023-10-08 MED ORDER — AMPHETAMINE-DEXTROAMPHET ER 20 MG PO CP24
20.0000 mg | ORAL_CAPSULE | Freq: Every day | ORAL | 0 refills | Status: DC
Start: 2023-11-07 — End: 2024-04-25
  Filled 2024-03-19: qty 30, 30d supply, fill #0

## 2023-10-08 MED ORDER — AMPHETAMINE-DEXTROAMPHET ER 30 MG PO CP24
30.0000 mg | ORAL_CAPSULE | Freq: Every day | ORAL | 0 refills | Status: DC
  Filled 2024-03-19: qty 19, 19d supply, fill #0
  Filled 2024-03-19: qty 11, 11d supply, fill #0

## 2023-10-08 MED ORDER — AMPHETAMINE-DEXTROAMPHETAMINE 10 MG PO TABS: 10.0000 mg | ORAL_TABLET | Freq: Every day | ORAL | 0 refills | Status: AC

## 2023-10-08 MED ORDER — AMPHETAMINE-DEXTROAMPHET ER 30 MG PO CP24
30.0000 mg | ORAL_CAPSULE | Freq: Every day | ORAL | 0 refills | Status: DC
  Filled 2023-10-08: qty 30, 30d supply, fill #0

## 2023-10-08 MED ORDER — AMPHETAMINE-DEXTROAMPHET ER 20 MG PO CP24
20.0000 mg | ORAL_CAPSULE | Freq: Every day | ORAL | 0 refills | Status: DC
  Filled 2023-10-08: qty 30, 30d supply, fill #0

## 2023-10-09 ENCOUNTER — Other Ambulatory Visit (HOSPITAL_COMMUNITY): Payer: Self-pay

## 2023-10-09 ENCOUNTER — Other Ambulatory Visit: Payer: Self-pay

## 2023-10-31 ENCOUNTER — Other Ambulatory Visit: Payer: Self-pay | Admitting: Internal Medicine

## 2023-10-31 NOTE — Telephone Encounter (Signed)
 Copied from CRM [id number]. Topic: Clinical - Medication Refill >> Oct 31, 2023  4:18 PM Donald Frost wrote: Most Recent Primary Care Visit:  Provider: Cleave Curling  Department: [facility]  Visit Type: OFFICE VISIT  Date: 09/02/2023  Medication: eszopiclone (LUNESTA) 2 MG TABS tablet  Has the patient contacted their pharmacy? Yes   Is this the correct pharmacy for this prescription? Yes If no, delete pharmacy and type the correct one.  This is the patient's preferred pharmacy:  [id number] - [place], [place] - [address] AT [facility] [address] [place] [address] Phone: [phone] Fax: [phone]   Has the prescription been filled recently? No  Is the patient out of the medication? Yes and she has been for almost a week  Has the patient been seen for an appointment in the last year OR does the patient have an upcoming appointment? Yes  Can we respond through MyChart? Yes  Please assist patient further

## 2023-11-20 ENCOUNTER — Encounter (HOSPITAL_COMMUNITY): Payer: Self-pay

## 2023-11-21 ENCOUNTER — Other Ambulatory Visit (HOSPITAL_COMMUNITY): Payer: Self-pay

## 2023-11-21 MED ORDER — AMPHETAMINE-DEXTROAMPHET ER 20 MG PO CP24
20.0000 mg | ORAL_CAPSULE | Freq: Every day | ORAL | 0 refills | Status: DC
  Filled 2023-11-21: qty 30, 30d supply, fill #0

## 2023-11-21 MED ORDER — AMPHETAMINE-DEXTROAMPHET ER 30 MG PO CP24
30.0000 mg | ORAL_CAPSULE | Freq: Every day | ORAL | 0 refills | Status: DC
  Filled 2023-11-21 – 2023-12-28 (×2): qty 30, 30d supply, fill #0

## 2023-11-21 MED ORDER — AMPHETAMINE-DEXTROAMPHETAMINE 10 MG PO TABS
10.0000 mg | ORAL_TABLET | Freq: Every day | ORAL | 0 refills | Status: DC
  Filled 2023-11-21 – 2024-02-05 (×2): qty 30, 30d supply, fill #0

## 2023-11-21 MED ORDER — AMPHETAMINE-DEXTROAMPHET ER 20 MG PO CP24
20.0000 mg | ORAL_CAPSULE | Freq: Every day | ORAL | 0 refills | Status: DC
  Filled 2023-11-21 – 2024-02-05 (×2): qty 30, 30d supply, fill #0

## 2023-11-21 MED ORDER — AMPHETAMINE-DEXTROAMPHET ER 30 MG PO CP24
30.0000 mg | ORAL_CAPSULE | Freq: Every day | ORAL | 0 refills | Status: DC
  Filled 2023-11-21 – 2024-02-05 (×2): qty 30, 30d supply, fill #0

## 2023-11-21 MED ORDER — AMPHETAMINE-DEXTROAMPHETAMINE 10 MG PO TABS
10.0000 mg | ORAL_TABLET | Freq: Every day | ORAL | 0 refills | Status: DC
  Filled 2023-11-21: qty 30, 30d supply, fill #0

## 2023-11-21 MED ORDER — AMPHETAMINE-DEXTROAMPHET ER 20 MG PO CP24
20.0000 mg | ORAL_CAPSULE | Freq: Every day | ORAL | 0 refills | Status: DC
  Filled 2023-11-21 – 2023-12-28 (×2): qty 30, 30d supply, fill #0

## 2023-11-21 MED ORDER — AMPHETAMINE-DEXTROAMPHET ER 30 MG PO CP24
30.0000 mg | ORAL_CAPSULE | Freq: Every day | ORAL | 0 refills | Status: DC
  Filled 2023-11-21: qty 30, 30d supply, fill #0

## 2023-11-21 MED ORDER — AMPHETAMINE-DEXTROAMPHETAMINE 10 MG PO TABS
10.0000 mg | ORAL_TABLET | Freq: Every day | ORAL | 0 refills | Status: DC
  Filled 2023-11-21 – 2023-12-28 (×2): qty 30, 30d supply, fill #0

## 2023-11-27 ENCOUNTER — Ambulatory Visit: Admitting: Internal Medicine

## 2023-11-27 ENCOUNTER — Encounter: Payer: Self-pay | Admitting: Internal Medicine

## 2023-11-27 VITALS — BP 118/78 | HR 89 | Temp 97.6°F | Ht 64.0 in | Wt 216.0 lb

## 2023-11-27 DIAGNOSIS — N182 Chronic kidney disease, stage 2 (mild): Secondary | ICD-10-CM | POA: Diagnosis not present

## 2023-11-27 DIAGNOSIS — E7800 Pure hypercholesterolemia, unspecified: Secondary | ICD-10-CM | POA: Diagnosis not present

## 2023-11-27 DIAGNOSIS — E1122 Type 2 diabetes mellitus with diabetic chronic kidney disease: Secondary | ICD-10-CM | POA: Diagnosis not present

## 2023-11-27 DIAGNOSIS — Z636 Dependent relative needing care at home: Secondary | ICD-10-CM

## 2023-11-27 DIAGNOSIS — E890 Postprocedural hypothyroidism: Secondary | ICD-10-CM

## 2023-11-27 DIAGNOSIS — I129 Hypertensive chronic kidney disease with stage 1 through stage 4 chronic kidney disease, or unspecified chronic kidney disease: Secondary | ICD-10-CM

## 2023-11-27 DIAGNOSIS — Z8249 Family history of ischemic heart disease and other diseases of the circulatory system: Secondary | ICD-10-CM

## 2023-11-27 DIAGNOSIS — Z23 Encounter for immunization: Secondary | ICD-10-CM | POA: Diagnosis not present

## 2023-11-27 DIAGNOSIS — Z6837 Body mass index (BMI) 37.0-37.9, adult: Secondary | ICD-10-CM

## 2023-11-27 DIAGNOSIS — E66812 Obesity, class 2: Secondary | ICD-10-CM

## 2023-11-27 MED ORDER — OZEMPIC (2 MG/DOSE) 8 MG/3ML ~~LOC~~ SOPN: PEN_INJECTOR | SUBCUTANEOUS | 2 refills | Status: AC

## 2023-11-27 NOTE — Assessment & Plan Note (Addendum)
 Chronic, currently on Ozempic  0.5mg  weekly which is suboptimal.  Mounjaro trial caused blurred vision, so she stopped the medication. We will increase to 1mg  weekly. 2mg  dose sent to the pharmacy, advised her to dial up 36 clicks weekly x 4 weeks. Plan to titrate Ozempic  for better glycemic control and weight loss. - Prescribe Ozempic  2 mg dose. - Titrate to 1 mg (36 clicks) initially, then increase to 2 mg after two weeks based on tolerance. - Advise to use MyChart for issues or adjustments. - Ensure dose not exceeded to avoid adverse effects.

## 2023-11-27 NOTE — Patient Instructions (Addendum)
 [facility]  Type 2 Diabetes Mellitus, Diagnosis, Adult Type 2 diabetes (type 2 diabetes mellitus) is a long-term (chronic) disease. It may happen when there is one or both of these problems: The pancreas does not make enough insulin. The body does not react in a normal way to insulin that it makes. Insulin lets sugars go into cells in your body. If you have type 2 diabetes, sugars cannot get into your cells. Sugars build up in the blood. This causes high blood sugar. What are the causes? The exact cause of this condition is not known. What increases the risk? Having type 2 diabetes in your family. Being overweight or very overweight. Not being active. Your body not reacting in a normal way to the insulin it makes. Having higher than normal blood sugar over time. Having a type of diabetes when you were pregnant. Having a condition that causes small fluid-filled sacs on your ovaries. What are the signs or symptoms? At first, you may have no symptoms. You will get symptoms slowly. They may include: More thirst than normal. More hunger than normal. Needing to pee more than normal. Losing weight without trying. Feeling tired. Feeling weak. Seeing things blurry. Dark patches on your skin. How is this treated? This condition may be treated by a diabetes expert. You may need to: Follow an eating plan made by a food expert (dietitian). Get regular exercise. Find ways to deal with stress. Check blood sugar as often as told. Take medicines. Your doctor will set treatment goals for you. Your blood sugar should be at these levels: Before meals: 80-130 mg/dL (4.4-7.2 mmol/L). After meals: below 180 mg/dL (10 mmol/L). Over the last 2-3 months: less than 7%. Follow these instructions at home: Medicines Take your diabetes medicines or insulin every day. Take medicines as told to help you prevent other problems caused by this condition. You may need: Aspirin. Medicine to lower  cholesterol. Medicine to control blood pressure. Questions to ask your doctor Should I meet with a diabetes educator? What medicines do I need, and when should I take them? What will I need to treat my condition at home? When should I check my blood sugar? Where can I find a support group? Who can I call if I have questions? When is my next doctor visit? General instructions Take over-the-counter and prescription medicines only as told by your doctor. Keep all follow-up visits. Where to find more information For help and guidance and more information about diabetes, please go to: American Diabetes Association (ADA): www.diabetes.org American Association of Diabetes Care and Education Specialists (ADCES): www.diabeteseducator.org International Diabetes Federation (IDF): DCOnly.dk Contact a doctor if: Your blood sugar is at or above 240 mg/dL (09.8 mmol/L) for 2 days in a row. You have been sick for 2 days or more, and you are not getting better. You have had a fever for 2 days or more, and you are not getting better. You have any of these problems for more than 6 hours: You cannot eat or drink. You feel like you may vomit. You vomit. You have watery poop (diarrhea). Get help right away if: Your blood sugar is lower than 54 mg/dL (3 mmol/L). You feel mixed up (confused). You have trouble thinking clearly. You have trouble breathing. You have medium or large ketone levels in your pee. These symptoms may be an emergency. Get help right away. Call your local emergency services (911 in the [place] Do not wait to see if the symptoms will go away. Do  not drive yourself to the hospital. Summary Type 2 diabetes is a long-term disease. Your pancreas may not make enough insulin, or your body may not react in a normal way to insulin that it makes. This condition is treated with an eating plan, lifestyle changes, and medicines. Your doctor will set treatment goals for you. These will help  you keep your blood sugar in a healthy range. Keep all follow-up visits. This information is not intended to replace advice given to you by your health care provider. Make sure you discuss any questions you have with your health care provider. Document Revised: 09/26/2020 Document Reviewed: 09/26/2020 Elsevier Patient Education  2024 [employer].

## 2023-11-27 NOTE — Progress Notes (Signed)
 I,Brittany Rubio, CMA,acting as a Neurosurgeon for Brittany Dung, MD.,have documented all relevant documentation on the behalf of Brittany Dung, MD,as directed by  Brittany Dung, MD while in the presence of Brittany Dung, MD.  Subjective:  Patient ID: Brittany Rubio , female    DOB: 1960/12/21 , 63 y.o.   MRN: [id number]  Chief Complaint  Patient presents with   Diabetes    Pt is here for diabetes check up also to follow up for ozempic . Pt denies specific questions and any other concerns. She reports compliance with medications.     HPI Discussed the use of AI scribe software for clinical note transcription with the patient, who gave verbal consent to proceed.  History of Present Illness Brittany Rubio is a 63 year old female with diabetes and hypertension who presents for a diabetes and blood pressure check.  She is not currently monitoring her blood glucose levels and is taking Ozempic  at a dose of 0.5 mg, which she feels is ineffective due to the low dose. She previously tried Mounjaro, which did not cause nausea but resulted in blurry vision. She took her last Ozempic  shot yesterday.  She received a COVID vaccine today and has not eaten yet, feeling 'starving'.  Her maternal grandmother had heart issues and frequently visited the doctor for her heart.  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 She has implemented the use of portable toilets and double pull-ups at night to prevent falls.  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 She expresses difficulty managing her responsibilities and has considered retiring after her husband, who is thinking about retiring in September.   Diabetes She presents for her follow-up diabetic visit. She has type 2 diabetes mellitus. Her disease course has been stable. Pertinent negatives for hypoglycemia include  no dizziness. Pertinent negatives for diabetes include no blurred vision, no polydipsia, no polyphagia and no polyuria. There are no hypoglycemic complications. Diabetic complications include nephropathy. Risk factors for coronary artery disease include diabetes mellitus, dyslipidemia, hypertension, obesity, sedentary lifestyle, post-menopausal and stress. Her weight is fluctuating minimally. She is following a diabetic diet.  Hypertension This is a chronic problem. The current episode started more than 1 year ago. The problem has been gradually improving since onset. The problem is uncontrolled. Pertinent negatives include no blurred vision or palpitations.     Past Medical History:  Diagnosis Date   ADD (attention deficit disorder)    Anemia    Anxiety    Arthritis    "left shoulder" (07/07/2014)   Back pain    Breast cancer (HCC)    "left"   Depression    Fatty liver    Food allergy    shellfish   GERD (gastroesophageal reflux disease)    "takes over the counters as needed"   Hernia, umbilical    High cholesterol    Hypertension 11/26/2011   sees Dr. Vonna Guardian   Hypothyroidism    Incisional hernia without obstruction or gangrene 06/05/2018   Joint pain    Migraines    "maybe once q 3 months" (07/07/2014)   Multinodular thyroid  2015   OSA on CPAP    Personal history of radiation therapy    Pneumonia    May 2018   Sepsis due to Streptococcus pneumoniae (HCC)    Septic shock (HCC)  Severe sepsis (HCC) 06/05/2019   Thyroid  cancer (HCC)    2015   Type II diabetes mellitus (HCC)    Vitamin D  deficiency      Family History  Problem Relation Age of Onset   Cancer Mother        Pancreatic   Diabetes Mother    Hypertension Mother    Depression Mother    Hypertension Father    Alcoholism Father      Current Outpatient Medications:    amphetamine -dextroamphetamine  (ADDERALL XR) 20 MG 24 hr capsule, Take 20 mg by mouth daily. 1 per day with the 10 mg and 30 mg xr,  Disp: , Rfl:    amphetamine -dextroamphetamine  (ADDERALL XR) 20 MG 24 hr capsule, Take 1 capsule by mouth daily, Disp: 30 capsule, Rfl: 0   amphetamine -dextroamphetamine  (ADDERALL XR) 20 MG 24 hr capsule, Take 1 capsule (20 mg total) by mouth daily., Disp: 30 capsule, Rfl: 0   amphetamine -dextroamphetamine  (ADDERALL XR) 20 MG 24 hr capsule, Take 1 capsule by mouth daily, Disp: 30 capsule, Rfl: 0   amphetamine -dextroamphetamine  (ADDERALL XR) 20 MG 24 hr capsule, Take 1 capsule (20 mg total) by mouth daily., Disp: 30 capsule, Rfl: 0   amphetamine -dextroamphetamine  (ADDERALL XR) 20 MG 24 hr capsule, Take 1 capsule (20 mg total) by mouth daily., Disp: 30 capsule, Rfl: 0   amphetamine -dextroamphetamine  (ADDERALL XR) 30 MG 24 hr capsule, Take 1 capsule (30 mg total) by mouth daily., Disp: 30 capsule, Rfl: 0   amphetamine -dextroamphetamine  (ADDERALL XR) 30 MG 24 hr capsule, Take 1 capsule by mouth daily, Disp: 30 capsule, Rfl: 0   amphetamine -dextroamphetamine  (ADDERALL XR) 30 MG 24 hr capsule, Take 1 capsule (30 mg total) by mouth daily., Disp: 30 capsule, Rfl: 0   amphetamine -dextroamphetamine  (ADDERALL XR) 30 MG 24 hr capsule, Take 1 capsule (30 mg total) by mouth daily., Disp: 30 capsule, Rfl: 0   amphetamine -dextroamphetamine  (ADDERALL XR) 30 MG 24 hr capsule, Take 1 capsule by mouth daily, Disp: 30 capsule, Rfl: 0   amphetamine -dextroamphetamine  (ADDERALL) 10 MG tablet, Take 1 tablet by mouth daily in the afternoon. May be filled 30 days after date prescribed, Disp: 30 tablet, Rfl: 0   amphetamine -dextroamphetamine  (ADDERALL) 10 MG tablet, Take 1 tablet (10 mg total) by mouth daily in the afternoon., Disp: 30 tablet, Rfl: 0   amphetamine -dextroamphetamine  (ADDERALL) 10 MG tablet, Take 1 tablet (10 mg total) by mouth daily in the afternoon., Disp: 30 tablet, Rfl: 0   amphetamine -dextroamphetamine  (ADDERALL) 10 MG tablet, Take 1 tablet (10 mg total) by mouth daily in the afternoon, Disp: 30 tablet, Rfl:  0   amphetamine -dextroamphetamine  (ADDERALL) 10 MG tablet, Take 1 tablet (10 mg total) by mouth daily in the afternoon, Disp: 30 tablet, Rfl: 0   amphetamine -dextroamphetamine  (ADDERALL) 10 MG tablet, Take 1 tablet (10 mg total) by mouth daily in the afternoon, Disp: 30 tablet, Rfl: 0   cholecalciferol  (VITAMIN D3) 25 MCG (1000 UT) tablet, Take 1,000 Units by mouth daily., Disp: , Rfl:    Cyanocobalamin  (VITAMIN B-12 PO), Take 1 tablet by mouth once a week. Tuesdays, Disp: , Rfl:    Dapagliflozin  Pro-metFORMIN  ER (XIGDUO  XR) 11-998 MG TB24, Take 1 tablet by mouth daily., Disp: 30 tablet, Rfl: 1   diazepam  (VALIUM ) 5 MG tablet, TAKE 1 TABLET BY MOUTH EVERY DAY AS NEEDED, Disp: 20 tablet, Rfl: 0   diltiazem  (CARTIA  XT) 240 MG 24 hr capsule, Take 1 capsule (240 mg total) by mouth daily., Disp: 90 capsule, Rfl: 2  EPINEPHrine  0.3 mg/0.3 mL IJ SOAJ injection, Inject 0.3 mg into the muscle once as needed. Allergic reaction, Disp: 1 each, Rfl: 3   eszopiclone  (LUNESTA ) 2 MG TABS tablet, Take 1 tablet (2 mg total) by mouth at bedtime. Take immediately before bedtime, Disp: 30 tablet, Rfl: 1   famotidine  (PEPCID ) 20 MG tablet, Take 20 mg by mouth as needed for heartburn or indigestion., Disp: , Rfl:    levocetirizine (XYZAL ) 5 MG tablet, TAKE 1 TABLET BY MOUTH EVERY DAY IN THE EVENING, Disp: 90 tablet, Rfl: 1   Levothyroxine  Sodium 175 MCG/ML SOLN, Take 175 mcg by mouth daily., Disp: 30 mL, Rfl: 5   Magnesium  500 MG CAPS, Take 500 mg by mouth daily., Disp: , Rfl:    pravastatin  (PRAVACHOL ) 80 MG tablet, TAKE ONE TABLET BY MOUTH MON-SUNDAY, Disp: 75 tablet, Rfl: 2   promethazine  (PHENERGAN ) 25 MG tablet, TAKE 1 TABLET BY MOUTH EVERY 8 HOURS AS NEEDED, Disp: 30 tablet, Rfl: 0   Rimegepant Sulfate (NURTEC) 75 MG TBDP, Take 1 tablet (75 mg total) by mouth daily as needed., Disp: 10 tablet, Rfl: 2   Semaglutide , 2 MG/DOSE, (OZEMPIC , 2 MG/DOSE,) 8 MG/3ML SOPN, Inject 2mg  sq weekly, Disp: 9 mL, Rfl: 2    TRINTELLIX  20 MG TABS tablet, TAKE 1 TABLET BY MOUTH EVERY DAY, Disp: 90 tablet, Rfl: 1   valsartan -hydrochlorothiazide  (DIOVAN -HCT) 160-25 MG tablet, TAKE 1 TABLET BY MOUTH EVERY DAY, Disp: 90 tablet, Rfl: 1   vitamin C  (ASCORBIC ACID ) 500 MG tablet, Take 500 mg by mouth daily., Disp: , Rfl:    Allergies  Allergen Reactions   Crab [Shellfish Allergy] Hives and Swelling    "Only when I eat too many crab legs" Can tolerate CT scans with contrast-does not need premeds       Review of Systems  Constitutional: Negative.   Eyes:  Negative for blurred vision.  Respiratory: Negative.    Cardiovascular: Negative.  Negative for palpitations.  Gastrointestinal: Negative.   Endocrine: Negative for polydipsia, polyphagia and polyuria.  Neurological: Negative.  Negative for dizziness.  Psychiatric/Behavioral:  Positive for sleep disturbance.      Today's Vitals   11/27/23 1010  BP: 118/78  Pulse: 89  Temp: 97.6 F (36.4 C)  SpO2: 98%  Weight: 216 lb (98 kg)  Height: 5\' 4"  (1.626 m)  PainSc: 5   PainLoc: Knee   Body mass index is 37.08 kg/m.  Wt Readings from Last 3 Encounters:  11/27/23 216 lb (98 kg)  09/02/23 216 lb 9.6 oz (98.2 kg)  06/27/23 208 lb (94.3 kg)     Objective:  Physical Exam Vitals and nursing note reviewed.  Constitutional:      Appearance: Normal appearance. She is obese.  HENT:     Head: Normocephalic and atraumatic.  Eyes:     Extraocular Movements: Extraocular movements intact.  Cardiovascular:     Rate and Rhythm: Normal rate and regular rhythm.     Heart sounds: Normal heart sounds.  Pulmonary:     Effort: Pulmonary effort is normal.     Breath sounds: Normal breath sounds.  Musculoskeletal:     Cervical back: Normal range of motion.  Skin:    General: Skin is warm.  Neurological:     General: No focal deficit present.     Mental Status: She is alert.  Psychiatric:        Mood and Affect: Mood normal.        Behavior: Behavior normal.  Assessment And Plan:  Type 2 diabetes mellitus with stage 2 chronic kidney disease, without long-term current use of insulin  [facility]) Assessment & Plan: Chronic, currently on Ozempic  0.5mg  weekly which is suboptimal.  Mounjaro trial caused blurred vision, so she stopped the medication. We will increase to 1mg  weekly. 2mg  dose sent to the pharmacy, advised her to dial up 36 clicks weekly x 4 weeks. Plan to titrate Ozempic  for better glycemic control and weight loss. - Prescribe Ozempic  2 mg dose. - Titrate to 1 mg (36 clicks) initially, then increase to 2 mg after two weeks based on tolerance. - Advise to use MyChart for issues or adjustments. - Ensure dose not exceeded to avoid adverse effects.  Orders: -     CMP14+EGFR -     Lipid panel -     Hemoglobin A1c  Hypertensive nephropathy Assessment & Plan: Chronic, controlled.  Goal BP<120/80.  She will continue with valsartan160/25mg  daily and Tiadylt  ER 240mg  daily.  Encouraged to follow low sodium diet.   Orders: -     CMP14+EGFR -     Lipid panel  Pure hypercholesterolemia Assessment & Plan: Discussed cardiac risk assessment due to family history. Recommended cardiac calcium  score. Explained insurance does not cover test, cost $99. Purpose to identify arterial calcifications to assess risk. - Order cardiac calcium  score at [facility] location. - Discuss results and implications post-test.  Orders: -     CT CARDIAC SCORING (SELF PAY ONLY); Future  Class 2 severe obesity due to excess calories with serious comorbidity and body mass index (BMI) of 37.0 to 37.9 in adult [facility]) Assessment & Plan: She is encouraged to strive for BMI less than 30 to decrease cardiac risk. Advised to aim for at least 150 minutes of exercise per week.    Caregiver stress Assessment & Plan: Discussed caregiving challenges and resources. Acknowledged emotional and physical burden, need for support. - Refer to Child psychotherapist for caregiver resources and  support through [employer].  Orders: -     AMB Referral VBCI Care Management  Family history of heart disease -     Lipoprotein A (LPA)  Immunization due Best boy Vaccine 17yrs & older  Other orders -     Ozempic  (2 MG/DOSE); Inject 2mg  sq weekly  Dispense: 9 mL; Refill: 2   Return if symptoms worsen or fail to improve.  Patient was given opportunity to ask questions. Patient verbalized understanding of the plan and was able to repeat key elements of the plan. All questions were answered to their satisfaction.   I, Brittany Dung, MD, have reviewed all documentation for this visit. The documentation on 11/27/23 for the exam, diagnosis, procedures, and orders are all accurate and complete.    IF YOU HAVE BEEN REFERRED TO A SPECIALIST, IT MAY TAKE 1-2 WEEKS TO SCHEDULE/PROCESS THE REFERRAL. IF YOU HAVE NOT HEARD FROM US /SPECIALIST IN TWO WEEKS, PLEASE GIVE US  A CALL AT [phone] X [phone].   THE PATIENT IS ENCOURAGED TO PRACTICE SOCIAL DISTANCING DUE TO THE COVID-19 PANDEMIC.

## 2023-11-28 LAB — CMP14+EGFR
ALT: 27 IU/L (ref 0–32)
AST: 25 IU/L (ref 0–40)
Albumin: 4.3 g/dL (ref 3.9–4.9)
Alkaline Phosphatase: 68 IU/L (ref 44–121)
BUN/Creatinine Ratio: 22 (ref 12–28)
BUN: 24 mg/dL (ref 8–27)
Bilirubin Total: 0.2 mg/dL (ref 0.0–1.2)
CO2: 25 mmol/L (ref 20–29)
Calcium: 9.4 mg/dL (ref 8.7–10.3)
Chloride: 95 mmol/L — ABNORMAL LOW (ref 96–106)
Creatinine, Ser: 1.07 mg/dL — ABNORMAL HIGH (ref 0.57–1.00)
Globulin, Total: 3 g/dL (ref 1.5–4.5)
Glucose: 189 mg/dL — ABNORMAL HIGH (ref 70–99)
Potassium: 4.2 mmol/L (ref 3.5–5.2)
Sodium: 138 mmol/L (ref 134–144)
Total Protein: 7.3 g/dL (ref 6.0–8.5)
eGFR: 58 mL/min/{1.73_m2} — ABNORMAL LOW (ref >59)

## 2023-11-28 LAB — LIPID PANEL
Chol/HDL Ratio: 3.9 ratio (ref 0.0–4.4)
Cholesterol, Total: 168 mg/dL (ref 100–199)
HDL: 43 mg/dL (ref >39)
LDL Chol Calc (NIH): 102 mg/dL — ABNORMAL HIGH (ref 0–99)
Triglycerides: 129 mg/dL (ref 0–149)
VLDL Cholesterol Cal: 23 mg/dL (ref 5–40)

## 2023-11-28 LAB — HEMOGLOBIN A1C
Est. average glucose Bld gHb Est-mCnc: 272 mg/dL
Hgb A1c MFr Bld: 11.1 % — ABNORMAL HIGH (ref 4.8–5.6)

## 2023-11-28 LAB — LIPOPROTEIN A (LPA): Lipoprotein (a): 124.7 nmol/L — ABNORMAL HIGH (ref <75.0)

## 2023-11-30 DIAGNOSIS — Z636 Dependent relative needing care at home: Secondary | ICD-10-CM | POA: Insufficient documentation

## 2023-11-30 NOTE — Assessment & Plan Note (Signed)
 Discussed cardiac risk assessment due to family history. Recommended cardiac calcium  score. Explained insurance does not cover test, cost $99. Purpose to identify arterial calcifications to assess risk. - Order cardiac calcium  score at [facility] location. - Discuss results and implications post-test.

## 2023-11-30 NOTE — Assessment & Plan Note (Addendum)
 She is encouraged to strive for BMI less than 30 to decrease cardiac risk. Advised to aim for at least 150 minutes of exercise per week.

## 2023-11-30 NOTE — Assessment & Plan Note (Signed)
 Chronic, controlled.  Goal BP<120/80.  She will continue with valsartan160/25mg  daily and Tiadylt  ER 240mg  daily.  Encouraged to follow low sodium diet.

## 2023-11-30 NOTE — Assessment & Plan Note (Signed)
 Discussed caregiving challenges and resources. Acknowledged emotional and physical burden, need for support. - Refer to Child psychotherapist for caregiver resources and support through [employer].

## 2023-11-30 NOTE — Assessment & Plan Note (Signed)
 She is currently taking Levothyroxine daily.  She is also followed by Endo.  I will check thyroid panel and adjust meds as needed.

## 2023-12-01 ENCOUNTER — Ambulatory Visit: Payer: Self-pay | Admitting: Internal Medicine

## 2023-12-02 ENCOUNTER — Ambulatory Visit: Payer: 59 | Admitting: Internal Medicine

## 2023-12-03 ENCOUNTER — Other Ambulatory Visit: Payer: Self-pay

## 2023-12-03 DIAGNOSIS — E1122 Type 2 diabetes mellitus with diabetic chronic kidney disease: Secondary | ICD-10-CM

## 2023-12-03 MED ORDER — DAPAGLIFLOZIN PRO-METFORMIN ER 5-1000 MG PO TB24: ORAL_TABLET | ORAL | 1 refills | Status: DC

## 2023-12-05 ENCOUNTER — Telehealth: Payer: Self-pay | Admitting: *Deleted

## 2023-12-05 NOTE — Progress Notes (Signed)
 Complex Care Management Note  Care Guide Note 12/05/2023 Name: Brittany Rubio MRN: [id number] DOB: May 03, 1961  Brittany Rubio is a 63 y.o. year old female who sees Cleave Curling, MD for primary care. I reached out to Viva Grise by phone today to offer complex care management services.  [name] was given information about Complex Care Management services today including:   The Complex Care Management services include support from the care team which includes your Nurse Care Manager, Clinical Social Worker, or Pharmacist.  The Complex Care Management team is here to help remove barriers to the health concerns and goals most important to you. Complex Care Management services are voluntary, and the patient may decline or stop services at any time by request to their care team member.   Complex Care Management Consent Status: Patient agreed to services and verbal consent obtained.   Follow up plan:  Telephone appointment with complex care management team member scheduled for:  5/28  Encounter Outcome:  Patient Scheduled  Barnie Bora  [facility]  [facility], [facility] Guide  Direct Dial: [phone]  Fax [phone]

## 2023-12-05 NOTE — Progress Notes (Signed)
 Complex Care Management Note Care Guide Note  12/05/2023 Name: Brittany Rubio MRN: [id number] DOB: Apr 30, 1961   Complex Care Management Outreach Attempts:  An unsuccessful telephone outreach was attempted today to offer the patient information about available complex care management services. Follow Up Plan:  Additional outreach attempts will be made to offer the patient complex care management information and services.   Encounter Outcome:  No Answer  Barnie Bora  [facility]  [facility], [facility] Guide  Direct Dial: [phone]  Fax [phone]

## 2023-12-08 ENCOUNTER — Other Ambulatory Visit: Payer: Self-pay | Admitting: Internal Medicine

## 2023-12-11 ENCOUNTER — Ambulatory Visit (HOSPITAL_BASED_OUTPATIENT_CLINIC_OR_DEPARTMENT_OTHER)
Admission: RE | Admit: 2023-12-11 | Discharge: 2023-12-11 | Disposition: A | Discharge status: Home / Self Care | Payer: Self-pay | Source: Ambulatory Visit | Attending: Internal Medicine | Admitting: Internal Medicine

## 2023-12-11 ENCOUNTER — Telehealth: Payer: Self-pay

## 2023-12-11 DIAGNOSIS — E7800 Pure hypercholesterolemia, unspecified: Secondary | ICD-10-CM | POA: Insufficient documentation

## 2023-12-16 ENCOUNTER — Telehealth: Payer: Self-pay

## 2023-12-16 NOTE — Progress Notes (Unsigned)
 Complex Care Management Note Care Guide Note  12/16/2023 Name: Brittany Rubio MRN: [id number] DOB: 07-01-1961   Complex Care Management Care Guide Note  12/18/2023 Name: Brittany Rubio MRN: [id number] DOB: Jan 03, 1961  Brittany Rubio is a 63 y.o. year old female who is a primary care patient of Cleave Curling, MD and is actively engaged with the care management team. I reached out to Viva Grise by phone today to assist with re-scheduling  with the Licensed Clinical Child psychotherapist.  Follow up plan: Unsuccessful telephone outreach attempt made. A HIPAA compliant phone message was left for the patient providing contact information and requesting a return call.  SIGNATURE Creola Doheny [facility], [facility] Guide  Direct Dial: [phone]  Fax [phone]

## 2023-12-18 ENCOUNTER — Telehealth: Payer: Self-pay

## 2023-12-18 NOTE — Progress Notes (Signed)
 Complex Care Management Care Guide Note  12/18/2023 Name: [name] MRN: [id number] DOB: 1961-01-17  Brittany Rubio is a 63 y.o. year old female who is a primary care patient of Cleave Curling, MD and is actively engaged with the care management team. I reached out to Viva Grise by phone today to assist with re-scheduling  with the Licensed Clinical Child psychotherapist.  Follow up plan: Unsuccessful telephone outreach attempt made. A HIPAA compliant phone message was left for the patient providing contact information and requesting a return call.  No further outreaches will be made at this time.  Creola Doheny [facility], [facility] Guide  Direct Dial: [phone]  Fax [phone]

## 2023-12-18 NOTE — Progress Notes (Signed)
 Complex Care Management Care Guide Note  12/18/2023 Name: KEALEY KEMMER MRN: [id number] DOB: [birth year]  RYAH CRIBB is a 63 y.o. year old female who is a primary care patient of Cleave Curling, MD and is actively engaged with the care management team. I reached out to Viva Grise by phone today to assist with re-scheduling  with the Licensed Clinical Child psychotherapist.  Follow up plan: Telephone appointment with complex care management team member scheduled for:  12-31-23  Creola Doheny [facility], [facility] Guide  Direct Dial: [phone]  Fax [phone]

## 2023-12-28 ENCOUNTER — Other Ambulatory Visit (HOSPITAL_COMMUNITY): Payer: Self-pay

## 2023-12-31 ENCOUNTER — Other Ambulatory Visit: Payer: Self-pay

## 2023-12-31 NOTE — Patient Instructions (Signed)
 Visit Information  Thank you for taking time to visit with me today. Please don't hesitate to contact me if I can be of assistance to you before our next scheduled appointment.  Our next appointment is by telephone on 01/24/2024 at 10;00am Please call the care guide team at [phone] if you need to cancel or reschedule your appointment.   Following is a copy of your care plan:   Goals Addressed             This Visit's Progress    VBCI Social Work Care Plan:LCSW       Problems:   Disease Management support and education needs related to Depression: and caregiver stress.  CSW Clinical Goal(s):   Over the next 90 days the Patient will work with Child psychotherapist to address concerns related to depression..  Over the next 90 days the Patient will work with Child psychotherapist to address concerns related to caregiver stress. Interventions:  Mental Health:  Evaluation of current treatment plan related to depression Active listening / Reflection utilized Behavioral Activation reviewed Depression screen reviewed Discussed referral options to connect for ongoing therapy: Patient is deciding if therapy will be pursued. Emotional Support Provided PHQ2/PHQ9 completed GAD& 7 Self Care Plan encouraged. LCSW encouraged patient to think about possible coping skills for depression. LCSW discussed depression triggers. Listening ear deployed. Caregiver stress Caregiver stress acknowledged  Discussed caregiver resources and support:  Participation in support group encouraged : Patient is agreeable to caregiver support group LCSW discussed more assistance with caregiver   Patient Goals/Self-Care Activities:  Call  to follow up on caregiver resources and support once resources are provided..             Connect with provider for ongoing mental health treatment.    Plan:   The care management team will reach out to the patient again on 01/24/2024 at 10:00 am.        Please call the Suicide  and Crisis Lifeline: 988 if you are experiencing a Mental Health or Behavioral Health Crisis or need someone to talk to.  Patient verbalizes understanding of instructions and care plan provided today and agrees to view in MyChart. Active MyChart status and patient understanding of how to access instructions and care plan via MyChart confirmed with patient.    Georgine Kitchens, MSW, LCSW [facility]  [facility], [facility] Health Licensed Clinical Social Worker Direct Dial: [phone]

## 2023-12-31 NOTE — Patient Outreach (Signed)
 Complex Care Management   Visit Note  12/31/2023  Name:  Brittany Rubio MRN: [id number] DOB: Mar 30, 1961  Situation: Referral received for Complex Care Management related to Mental/Behavioral Health diagnosis depression and caregiver stress. I obtained verbal consent from Patient.  Visit completed with patient. Patient reports symptoms of depression related to caregiver stress. 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  Background:   Past Medical History:  Diagnosis Date   ADD (attention deficit disorder)    Anemia    Anxiety    Arthritis    left shoulder (07/07/2014)   Back pain    Breast cancer (HCC)    left   Depression    Fatty liver    Food allergy    shellfish   GERD (gastroesophageal reflux disease)    takes over the counters as needed   Hernia, umbilical    High cholesterol    Hypertension 11/26/2011   sees Dr. Vonna Guardian   Hypothyroidism    Incisional hernia without obstruction or gangrene 06/05/2018   Joint pain    Migraines    maybe once q 3 months (07/07/2014)   Multinodular thyroid  2015   OSA on CPAP    Personal history of radiation therapy    Pneumonia    May 2018   Sepsis due to Streptococcus pneumoniae (HCC)    Septic shock (HCC)    Severe sepsis (HCC) 06/05/2019   Thyroid  cancer (HCC)    2015   Type II diabetes mellitus (HCC)    Vitamin D  deficiency     Assessment: Patient Reported Symptoms:  Cognitive Cognitive Status: Able to follow simple commands, Alert and oriented to person, place, and time, Normal speech and language skills      Neurological Neurological Review of Symptoms: No symptoms reported    HEENT HEENT Symptoms Reported: No symptoms reported      Cardiovascular Cardiovascular Symptoms Reported: No symptoms reported Cardiovascular Conditions: Hypertension, High blood cholesterol  Respiratory Respiratory Symptoms Reported: No symptoms reported Respiratory Conditions: Seasonal allergies Respiratory  Self-Management Outcome: 3 (uncertain)  Endocrine Patient reports the following symptoms related to hypoglycemia or hyperglycemia : No symptoms reported Endocrine Conditions: Diabetes  Gastrointestinal Gastrointestinal Symptoms Reported: No symptoms reported      Genitourinary Genitourinary Symptoms Reported: No symptoms reported    Integumentary Integumentary Symptoms Reported: No symptoms reported    Musculoskeletal Musculoskelatal Symptoms Reviewed: No symptoms reported   Falls in the past year?: No Number of falls in past year: 1 or less    Psychosocial Psychosocial Symptoms Reported: Depression - if selected complete PHQ 2-9, Anxiety - if selected complete GAD Additional Psychological Details: patient reports no anxiety symptoms Behavioral Health Conditions: ADHD/ADD, Depression, Anxiety Behavioral Management Strategies: Coping strategies (Patient is amenable to counseling) Major Change/Loss/Stressor/Fears (CP): Relationship concerns (Patient is caregiver for her mother-in law and sister) Behaviors When Feeling Stressed/Fearful: feeling overwhelmed Quality of Family Relationships: stressful, supportive Do you feel physically threatened by others?: No      12/31/2023   11:26 AM  Depression screen PHQ 2/9  Decreased Interest 0  Down, Depressed, Hopeless 2  PHQ - 2 Score 2  Altered sleeping 1  Tired, decreased energy 1  Change in appetite 0  Feeling bad or failure about yourself  1  Trouble concentrating 0  Moving slowly or fidgety/restless 0  Suicidal thoughts 0  PHQ-9 Score 5  Difficult doing work/chores Somewhat difficult    There were no vitals filed for this visit.  Medications Reviewed Today  Reviewed by Festus Hubert (Social Worker) on 12/31/23 at 1148  Med List Status: <None>   Medication Order Taking? Sig Documenting Provider Last Dose Status Informant  amphetamine -dextroamphetamine  (ADDERALL XR) 20 MG 24 hr capsule [id number] Yes Take 20 mg by  mouth daily. 1 per day with the 10 mg and 30 mg xr [provider]  Active   amphetamine -dextroamphetamine  (ADDERALL XR) 20 MG 24 hr capsule [id number]  Take 1 capsule by mouth daily   Active   amphetamine -dextroamphetamine  (ADDERALL XR) 20 MG 24 hr capsule [id number]  Take 1 capsule (20 mg total) by mouth daily.   Active   amphetamine -dextroamphetamine  (ADDERALL XR) 20 MG 24 hr capsule [id number]  Take 1 capsule by mouth daily   Active   amphetamine -dextroamphetamine  (ADDERALL XR) 20 MG 24 hr capsule [id number]  Take 1 capsule (20 mg total) by mouth daily.   Active   amphetamine -dextroamphetamine  (ADDERALL XR) 20 MG 24 hr capsule [id number]  Take 1 capsule (20 mg total) by mouth daily.   Active   amphetamine -dextroamphetamine  (ADDERALL XR) 30 MG 24 hr capsule [id number] Yes Take 1 capsule (30 mg total) by mouth daily.   Active   amphetamine -dextroamphetamine  (ADDERALL XR) 30 MG 24 hr capsule [id number]  Take 1 capsule by mouth daily   Active   amphetamine -dextroamphetamine  (ADDERALL XR) 30 MG 24 hr capsule [id number]  Take 1 capsule (30 mg total) by mouth daily.   Active   amphetamine -dextroamphetamine  (ADDERALL XR) 30 MG 24 hr capsule [id number]  Take 1 capsule (30 mg total) by mouth daily.   Active   amphetamine -dextroamphetamine  (ADDERALL XR) 30 MG 24 hr capsule [id number]  Take 1 capsule by mouth daily   Active   amphetamine -dextroamphetamine  (ADDERALL) 10 MG tablet [id number]  Take 1 tablet by mouth daily in the afternoon. May be filled 30 days after date prescribed   Active   amphetamine -dextroamphetamine  (ADDERALL) 10 MG tablet [id number]  Take 1 tablet (10 mg total) by mouth daily in the afternoon.   Active   amphetamine -dextroamphetamine  (ADDERALL) 10 MG tablet [id number] Yes Take 1 tablet (10 mg total) by mouth daily in the afternoon.   Active   amphetamine -dextroamphetamine  (ADDERALL) 10 MG tablet [id number]  Take 1 tablet (10 mg total) by mouth daily in the afternoon   Active    amphetamine -dextroamphetamine  (ADDERALL) 10 MG tablet [id number]  Take 1 tablet (10 mg total) by mouth daily in the afternoon   Active   amphetamine -dextroamphetamine  (ADDERALL) 10 MG tablet [id number]  Take 1 tablet (10 mg total) by mouth daily in the afternoon   Active   cholecalciferol  (VITAMIN D3) 25 MCG (1000 UT) tablet [id number] Yes Take 1,000 Units by mouth daily. [provider]  Active Self  Cyanocobalamin  (VITAMIN B-12 PO) [id number] Yes Take 1 tablet by mouth once a week. Tuesdays [provider]  Active Self  Dapagliflozin  Pro-metFORMIN  ER (XIGDUO  XR) 11-998 MG TB24 [id number] Yes TAKE 1 TABLET TWICE DAILY. Cleave Curling, MD  Active   diazepam  (VALIUM ) 5 MG tablet [id number] Yes TAKE 1 TABLET BY MOUTH EVERY DAY AS NEEDED Cleave Curling, MD  Active   diltiazem  (CARTIA  XT) 240 MG 24 hr capsule [id number] Yes Take 1 capsule (240 mg total) by mouth daily. Cleave Curling, MD  Active   EPINEPHrine  0.3 mg/0.3 mL IJ SOAJ injection [id number] Yes Inject 0.3 mg into the muscle once as needed. Allergic reaction Cleave Curling, MD  Active   eszopiclone  (LUNESTA ) 2 MG TABS tablet [id number] Yes Take 1 tablet (2 mg total)  by mouth at bedtime. Take immediately before bedtime Cleave Curling, MD  Active   famotidine  (PEPCID ) 20 MG tablet [id number] Yes Take 20 mg by mouth as needed for heartburn or indigestion. [provider]  Active   levocetirizine (XYZAL ) 5 MG tablet [id number] Yes TAKE 1 TABLET BY MOUTH EVERY DAY IN THE Saddie Crane, MD  Active   Levothyroxine  Sodium 175 MCG/ML SOLN [id number] Yes Take 175 mcg by mouth daily. Cleave Curling, MD  Active   Magnesium  500 MG CAPS [id number] Yes Take 500 mg by mouth daily. [provider]  Active Self  pravastatin  (PRAVACHOL ) 80 MG tablet [id number] Yes TAKE 1 TABLET BY MOUTH DAILY ON MONDAY THROUGH Judy Null, MD  Active   promethazine  (PHENERGAN ) 25 MG tablet [id number] Yes TAKE 1 TABLET BY MOUTH EVERY 8 HOURS  AS NEEDED Cleave Curling, MD  Active   Rimegepant Sulfate (NURTEC) 75 MG TBDP [id number] Yes Take 1 tablet (75 mg total) by mouth daily as needed. Cleave Curling, MD  Active   Semaglutide , 2 MG/DOSE, (OZEMPIC , 2 MG/DOSE,) 8 MG/3ML SOPN [id number] Yes Inject 2mg  sq weekly Cleave Curling, MD  Active   TRINTELLIX  20 MG TABS tablet [id number] Yes TAKE 1 TABLET BY MOUTH EVERY DAY Cleave Curling, MD  Active   valsartan -hydrochlorothiazide  (DIOVAN -HCT) 160-25 MG tablet [id number] Yes TAKE 1 TABLET BY MOUTH EVERY DAY Cleave Curling, MD  Active   vitamin C  (ASCORBIC ACID ) 500 MG tablet [id number] Yes Take 500 mg by mouth daily. [provider]  Active Self            Recommendation:   PCP Follow-up  Follow Up Plan:   Telephone follow-up 01/24/2024 at 10:00 am  Georgine Kitchens, MSW, [employer]  [facility], [facility] Licensed Clinical Social Worker Direct Dial: [phone]

## 2024-01-08 ENCOUNTER — Other Ambulatory Visit: Payer: Self-pay | Admitting: Internal Medicine

## 2024-01-08 DIAGNOSIS — I10 Essential (primary) hypertension: Secondary | ICD-10-CM

## 2024-01-08 DIAGNOSIS — E1129 Type 2 diabetes mellitus with other diabetic kidney complication: Secondary | ICD-10-CM

## 2024-01-08 DIAGNOSIS — R931 Abnormal findings on diagnostic imaging of heart and coronary circulation: Secondary | ICD-10-CM

## 2024-01-14 ENCOUNTER — Other Ambulatory Visit: Payer: Self-pay | Admitting: Internal Medicine

## 2024-01-24 ENCOUNTER — Other Ambulatory Visit: Payer: Self-pay

## 2024-01-24 NOTE — Patient Outreach (Signed)
 Complex Care Management   Visit Note  01/24/2024  Name:  Brittany Rubio MRN: [id number] DOB: 07-23-1960  Situation: Referral received for Complex Care Management related to Mental/Behavioral Health diagnosis depression. I obtained verbal consent from Patient.  Visit completed with patient  on the phone. Patient reports symptoms of depression related to caregiver stress. 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 Patient was provided with a website research mental health providers and contact information for caregiver support group. Patient reports that depression is stable and declines reassessment today.    Background:   Past Medical History:  Diagnosis Date   ADD (attention deficit disorder)    Anemia    Anxiety    Arthritis    left shoulder (07/07/2014)   Back pain    Breast cancer (HCC)    left   Depression    Fatty liver    Food allergy    shellfish   GERD (gastroesophageal reflux disease)    takes over the counters as needed   Hernia, umbilical    High cholesterol    Hypertension 11/26/2011   sees Dr. Grayce Slocumb   Hypothyroidism    Incisional hernia without obstruction or gangrene 06/05/2018   Joint pain    Migraines    maybe once q 3 months (07/07/2014)   Multinodular thyroid  2015   OSA on CPAP    Personal history of radiation therapy    Pneumonia    May 2018   Sepsis due to Streptococcus pneumoniae (HCC)    Septic shock (HCC)    Severe sepsis (HCC) 06/05/2019   Thyroid  cancer (HCC)    2015   Type II diabetes mellitus (HCC)    Vitamin D  deficiency     Assessment: Patient Reported Symptoms:  Cognitive Cognitive Status: Able to follow simple commands, Normal speech and language skills, Alert and oriented to person, place, and time      Neurological Neurological Review of Symptoms: Not assessed    HEENT HEENT Symptoms Reported: Not assessed      Cardiovascular Cardiovascular Symptoms Reported: Not assessed    Respiratory  Respiratory Symptoms Reported: Not assesed    Endocrine Endocrine Symptoms Reported: Not assessed    Gastrointestinal Gastrointestinal Symptoms Reported: Not assessed      Genitourinary Genitourinary Symptoms Reported: Not assessed    Integumentary Integumentary Symptoms Reported: Not assessed    Musculoskeletal Musculoskelatal Symptoms Reviewed: Not assessed        Psychosocial Psychosocial Symptoms Reported: Depression - if selected complete PHQ 2-9 (Patient declines rescreening today) Additional Psychological Details: Patient reports that depression is stable Behavioral Management Strategies: Counseling (Patient is agreeable to counseling) Behavioral Health Self-Management Outcome: 3 (uncertain) Major Change/Loss/Stressor/Fears (CP): Environment (caregiver stress) Techniques to Cope with Loss/Stress/Change: Counseling Quality of Family Relationships: stressful, supportive Do you feel physically threatened by others?: No      12/31/2023   11:26 AM  Depression screen PHQ 2/9  Decreased Interest 0  Down, Depressed, Hopeless 2  PHQ - 2 Score 2  Altered sleeping 1  Tired, decreased energy 1  Change in appetite 0  Feeling bad or failure about yourself  1  Trouble concentrating 0  Moving slowly or fidgety/restless 0  Suicidal thoughts 0  PHQ-9 Score 5  Difficult doing work/chores Somewhat difficult    There were no vitals filed for this visit.  Medications Reviewed Today     Reviewed by Sherren Olam FORBES KEN (Social Worker) on 01/24/24 at 1034  Med List Status: <None>  Medication Order Taking? Sig Documenting Provider Last Dose Status Informant  amphetamine -dextroamphetamine  (ADDERALL XR) 20 MG 24 hr capsule [id number]  Take 20 mg by mouth daily. 1 per day with the 10 mg and 30 mg xr [provider]  Active   amphetamine -dextroamphetamine  (ADDERALL XR) 20 MG 24 hr capsule [id number] No Take 1 capsule by mouth daily  Taking Active   amphetamine -dextroamphetamine   (ADDERALL XR) 20 MG 24 hr capsule [id number] No Take 1 capsule (20 mg total) by mouth daily.  Taking Active   amphetamine -dextroamphetamine  (ADDERALL XR) 20 MG 24 hr capsule [id number] No Take 1 capsule by mouth daily  Taking Active   amphetamine -dextroamphetamine  (ADDERALL XR) 20 MG 24 hr capsule [id number] No Take 1 capsule (20 mg total) by mouth daily.  Taking Active   amphetamine -dextroamphetamine  (ADDERALL XR) 20 MG 24 hr capsule [id number] No Take 1 capsule (20 mg total) by mouth daily.  Taking Active   amphetamine -dextroamphetamine  (ADDERALL XR) 30 MG 24 hr capsule [id number]  Take 1 capsule (30 mg total) by mouth daily.   Active   amphetamine -dextroamphetamine  (ADDERALL XR) 30 MG 24 hr capsule [id number] No Take 1 capsule by mouth daily  Taking Active   amphetamine -dextroamphetamine  (ADDERALL XR) 30 MG 24 hr capsule [id number] No Take 1 capsule (30 mg total) by mouth daily.  Taking Active   amphetamine -dextroamphetamine  (ADDERALL XR) 30 MG 24 hr capsule [id number] No Take 1 capsule (30 mg total) by mouth daily.  Taking Active   amphetamine -dextroamphetamine  (ADDERALL XR) 30 MG 24 hr capsule [id number] No Take 1 capsule by mouth daily  Taking Active   amphetamine -dextroamphetamine  (ADDERALL) 10 MG tablet [id number] No Take 1 tablet by mouth daily in the afternoon. May be filled 30 days after date prescribed  Taking Active   amphetamine -dextroamphetamine  (ADDERALL) 10 MG tablet [id number] No Take 1 tablet (10 mg total) by mouth daily in the afternoon.  Taking Active   amphetamine -dextroamphetamine  (ADDERALL) 10 MG tablet [id number]  Take 1 tablet (10 mg total) by mouth daily in the afternoon.   Active   amphetamine -dextroamphetamine  (ADDERALL) 10 MG tablet [id number] No Take 1 tablet (10 mg total) by mouth daily in the afternoon  Taking Active   amphetamine -dextroamphetamine  (ADDERALL) 10 MG tablet [id number] No Take 1 tablet (10 mg total) by mouth daily in the afternoon  Taking Active    amphetamine -dextroamphetamine  (ADDERALL) 10 MG tablet [id number] No Take 1 tablet (10 mg total) by mouth daily in the afternoon  Taking Active   cholecalciferol  (VITAMIN D3) 25 MCG (1000 UT) tablet [id number]  Take 1,000 Units by mouth daily. [provider]  Active Self  Cyanocobalamin  (VITAMIN B-12 PO) [id number]  Take 1 tablet by mouth once a week. Tuesdays [provider]  Active Self  Dapagliflozin  Pro-metFORMIN  ER (XIGDUO  XR) 11-998 MG TB24 [id number]  TAKE 1 TABLET TWICE DAILY. Jarold Medici, MD  Active   diazepam  (VALIUM ) 5 MG tablet [id number]  TAKE 1 TABLET BY MOUTH EVERY DAY AS NEEDED Jarold Medici, MD  Active   diltiazem  (CARTIA  XT) 240 MG 24 hr capsule [id number]  Take 1 capsule (240 mg total) by mouth daily. Jarold Medici, MD  Active   EPINEPHrine  0.3 mg/0.3 mL IJ SOAJ injection [id number]  Inject 0.3 mg into the muscle once as needed. Allergic reaction Jarold Medici, MD  Active   eszopiclone  (LUNESTA ) 2 MG TABS tablet [id number]  Take 1 tablet (2 mg total) by mouth at bedtime. Take immediately before bedtime Jarold Medici, MD  Active   famotidine  (PEPCID ) 20 MG  tablet [id number]  Take 20 mg by mouth as needed for heartburn or indigestion. [provider]  Active   levocetirizine (XYZAL ) 5 MG tablet [id number]  TAKE 1 TABLET BY MOUTH EVERY DAY IN THE [place]AR[place] Jarold Medici, MD  Active   Levothyroxine  Sodium 175 MCG/ML SOLN [id number]  Take 175 mcg by mouth daily. Jarold Medici, MD  Active   Magnesium  500 MG CAPS [id number]  Take 500 mg by mouth daily. [provider]  Active Self  pravastatin  (PRAVACHOL ) 80 MG tablet [id number]  TAKE 1 TABLET BY MOUTH DAILY ON MONDAY THROUGH GERILYN Jarold Medici, MD  Active   promethazine  (PHENERGAN ) 25 MG tablet [id number]  TAKE 1 TABLET BY MOUTH EVERY 8 HOURS AS NEEDED Jarold Medici, MD  Active   Rimegepant Sulfate (NURTEC) 75 MG TBDP [id number]  Take 1 tablet (75 mg total) by mouth daily as needed. Jarold Medici, MD   Active   Semaglutide , 2 MG/DOSE, (OZEMPIC , 2 MG/DOSE,) 8 MG/3ML SOPN [id number]  Inject 2mg  sq weekly Jarold Medici, MD  Active   TRINTELLIX  20 MG TABS tablet [id number]  TAKE 1 TABLET BY MOUTH EVERY DAY Jarold Medici, MD  Active   valsartan -hydrochlorothiazide  (DIOVAN -HCT) 160-25 MG tablet [id number]  TAKE 1 TABLET BY MOUTH EVERY DAY Jarold Medici, MD  Active   vitamin C  (ASCORBIC ACID ) 500 MG tablet [id number]  Take 500 mg by mouth daily. [provider]  Active Self            Recommendation:   Continue Current Plan of Care Follow up with making an appointment with a mental health provider. Follow Up Plan:   Telephone follow-up 8/07/24/2023 at 10:00 am  Olam Ally, MSW, [employer] [place]  [facility], [facility] Licensed Clinical Social Worker Direct Dial: [phone]

## 2024-01-24 NOTE — Patient Instructions (Signed)
 Visit Information  Thank you for taking time to visit with me today. Please don't hesitate to contact me if I can be of assistance to you before our next scheduled appointment.  Your next care management appointment is by telephone on 02/14/2024 at 10:00 am   Please call the care guide team at [phone] if you need to cancel, schedule, or reschedule an appointment.   Please call the Suicide and Crisis Lifeline: 988 if you are experiencing a Mental Health or Behavioral Health Crisis or need someone to talk to.  Olam Ally, MSW, LCSW [place]  [facility], [facility] Licensed Clinical Social Worker Direct Dial: [phone]

## 2024-01-27 NOTE — Patient Outreach (Signed)
 LCSW was alerted that the transportation SDOH screening was not completed for patient. LCSW called and spoke with patient about the SDOH for transportation and the screening was negative. LCSW reminded patient of appointment that is scheduled for next month.  Olam Ally, MSW, LCSW [place]  [facility], [facility] Licensed Clinical Social Worker Direct Dial: [phone]

## 2024-02-05 ENCOUNTER — Other Ambulatory Visit (HOSPITAL_COMMUNITY): Payer: Self-pay

## 2024-02-05 ENCOUNTER — Other Ambulatory Visit: Payer: Self-pay | Admitting: Internal Medicine

## 2024-02-05 DIAGNOSIS — F419 Anxiety disorder, unspecified: Secondary | ICD-10-CM

## 2024-02-05 MED ORDER — ESZOPICLONE 2 MG PO TABS: 2.0000 mg | ORAL_TABLET | Freq: Every day | ORAL | 1 refills | Status: DC

## 2024-02-14 ENCOUNTER — Other Ambulatory Visit: Payer: Self-pay

## 2024-02-14 NOTE — Patient Instructions (Signed)
 Visit Information  GRISELDA BRAMBLETT - I am sorry I was unable to reach you today for our scheduled appointment. I work with Jarold Medici, MD and am calling to support your healthcare needs. Please contact me at [phone] at your earliest convenience. I look forward to speaking with you soon.   Thank you,  Olam Ally, MSW, LCSW   [facility], [facility] Licensed Clinical Social Worker Direct Dial: [phone]

## 2024-02-14 NOTE — Patient Outreach (Signed)
 LCSW called patient at scheduled time and was unable to reach her. LCSW left a voicemail for a return called.  Olam Ally, MSW, LCSW [place]  [facility], [facility] Licensed Clinical Social Worker Direct Dial: [phone]

## 2024-03-02 ENCOUNTER — Encounter: Payer: Self-pay | Admitting: Internal Medicine

## 2024-03-10 ENCOUNTER — Telehealth: Payer: Self-pay

## 2024-03-10 NOTE — Patient Outreach (Signed)
 LCSW attempted to reach patient for the 2nd unsuccessful attempt. LCSW will attempt to call patient for the 3rd time. LCSW scheduled visit for  03/19/2024 at 9:30 am.   Olam Ally, MSW, LCSW [place]  [facility], [facility] Licensed Clinical Social Worker Direct Dial: [phone]

## 2024-03-10 NOTE — Patient Instructions (Signed)
 Visit Information  CIRA DEYOE - I am sorry I was unable to reach you today for our scheduled appointment. I work with Jarold Medici, MD and am calling to support your healthcare needs. Please contact me at [phone] at your earliest convenience. I look forward to speaking with you soon.    Your next care management appointment is by telephone on 03/19/2024 at 9;30 am    Please call the care guide team at [phone] if you need to cancel, schedule, or reschedule an appointment.   Please call 911 if you are experiencing a Mental Health or Behavioral Health Crisis or need someone to talk to.  Olam Ally, MSW, LCSW [place]  [facility], [facility] Licensed Clinical Social Worker Direct Dial: [phone]

## 2024-03-12 ENCOUNTER — Telehealth: Payer: Self-pay | Admitting: Pharmacy Technician

## 2024-03-12 ENCOUNTER — Other Ambulatory Visit (HOSPITAL_COMMUNITY): Payer: Self-pay

## 2024-03-12 ENCOUNTER — Ambulatory Visit (HOSPITAL_BASED_OUTPATIENT_CLINIC_OR_DEPARTMENT_OTHER): Admitting: Internal Medicine

## 2024-03-12 ENCOUNTER — Encounter (HOSPITAL_BASED_OUTPATIENT_CLINIC_OR_DEPARTMENT_OTHER): Payer: Self-pay | Admitting: Internal Medicine

## 2024-03-12 VITALS — BP 132/76 | HR 91 | Ht 64.0 in | Wt 204.0 lb

## 2024-03-12 DIAGNOSIS — Z79899 Other long term (current) drug therapy: Secondary | ICD-10-CM | POA: Diagnosis not present

## 2024-03-12 DIAGNOSIS — E785 Hyperlipidemia, unspecified: Secondary | ICD-10-CM | POA: Diagnosis not present

## 2024-03-12 DIAGNOSIS — R931 Abnormal findings on diagnostic imaging of heart and coronary circulation: Secondary | ICD-10-CM

## 2024-03-12 DIAGNOSIS — E7841 Elevated Lipoprotein(a): Secondary | ICD-10-CM | POA: Diagnosis not present

## 2024-03-12 MED ORDER — REPATHA SURECLICK 140 MG/ML ~~LOC~~ SOAJ: 140.0000 mg | SUBCUTANEOUS | 6 refills | Status: DC

## 2024-03-12 NOTE — Progress Notes (Signed)
 LIPID CLINIC CONSULT NOTE  Chief Complaint:  Manage dyslipidemia  Primary Care Physician: Jarold Medici, MD  Primary Cardiologist:  None  HPI:  Brittany Rubio is a 63 y.o. female who is being seen today for the evaluation of dyslipidemia at the request of Jarold Medici, MD. is a pleasant 63 year old female kindly referred for evaluation management of dyslipidemia.  She has a history of elevated cholesterol however recent lipid testing shows pretty good control with total cholesterol 168, triglycerides 129, HDL 43 and LDL 102.  She was recently found to have an elevated LP(a) at 124.7 nmol/L.  Subsequently she also had imaging which showed some fatty liver and an elevated coronary calcium  score of 282, 64th percentile for age and sex matched controls.  She is already on maximal dose pravastatin  80 mg daily.  PMHx:  Past Medical History:  Diagnosis Date   ADD (attention deficit disorder)    Anemia    Anxiety    Arthritis    left shoulder (07/07/2014)   Back pain    Breast cancer (HCC)    left   Depression    Fatty liver    Food allergy    shellfish   GERD (gastroesophageal reflux disease)    takes over the counters as needed   Hernia, umbilical    High cholesterol    Hypertension 11/26/2011   sees Dr. Grayce Jarold   Hypothyroidism    Incisional hernia without obstruction or gangrene 06/05/2018   Joint pain    Migraines    maybe once q 3 months (07/07/2014)   Multinodular thyroid  2015   OSA on CPAP    Personal history of radiation therapy    Pneumonia    May 2018   Sepsis due to Streptococcus pneumoniae (HCC)    Septic shock (HCC)    Severe sepsis (HCC) 06/05/2019   Thyroid  cancer (HCC)    2015   Type II diabetes mellitus (HCC)    Vitamin D  deficiency     Past Surgical History:  Procedure Laterality Date   ABDOMINAL HYSTERECTOMY  ? 1997   BREAST BIOPSY Left 2020   BREAST EXCISIONAL BIOPSY Right 2014   BREAST LUMPECTOMY Left 2009   BREAST  REDUCTION SURGERY Bilateral    DIAPHRAGM SURGERY  1986   trauma   INCISIONAL HERNIA REPAIR N/A 06/05/2018   Procedure: LAPAROSCOPIC INCISIONAL HERNIA ERAS PATHWAY;  Surgeon: Vanderbilt Ned, MD;  Location: [facility] OR;  Service: General;  Laterality: N/A;   INSERTION OF MESH N/A 06/05/2018   Procedure: INSERTION OF MESH;  Surgeon: Vanderbilt Ned, MD;  Location: [facility] OR;  Service: General;  Laterality: N/A;   PARTIAL MASTECTOMY WITH NEEDLE LOCALIZATION  08/12/2012   Procedure: PARTIAL MASTECTOMY WITH NEEDLE LOCALIZATION;  Surgeon: Ned LABOR. Cornett, MD;  Location: [facility] OR;  Service: General;  Laterality: Right;   REDUCTION MAMMAPLASTY Bilateral 1995   THYROIDECTOMY Left 07/07/2014   Procedure: LEFT HEMI-THYROIDECTOMY;  Surgeon: Ana LELON Moccasin, MD;  Location: [facility] OR;  Service: ENT;  Laterality: Left;   THYROIDECTOMY Right 07/08/2014   Procedure: Completion THYROIDECTOMY;  Surgeon: Ana LELON Moccasin, MD;  Location: [facility] OR;  Service: ENT;  Laterality: Right;   THYROIDECTOMY, PARTIAL Left 07/07/2014   hemi    FAMHx:  Family History  Problem Relation Age of Onset   Cancer Mother        Pancreatic   Diabetes Mother    Hypertension Mother    Depression Mother    Hypertension Father  Alcoholism Father     SOCHx:   reports that she has never smoked. She has never used smokeless tobacco. She reports current alcohol use. She reports that she does not use drugs.  ALLERGIES:  Allergies  Allergen Reactions   Crab [Shellfish Allergy] Hives and Swelling    Only when I eat too many crab legs Can tolerate CT scans with contrast-does not need premeds      ROS: Pertinent items noted in HPI and remainder of comprehensive ROS otherwise negative.  HOME MEDS: Current Outpatient Medications on File Prior to Visit  Medication Sig Dispense Refill   amphetamine -dextroamphetamine  (ADDERALL XR) 20 MG 24 hr capsule Take 20 mg by mouth daily. 1 per day with the 10 mg and 30 mg xr     amphetamine -dextroamphetamine   (ADDERALL XR) 20 MG 24 hr capsule Take 1 capsule by mouth daily 30 capsule 0   amphetamine -dextroamphetamine  (ADDERALL XR) 20 MG 24 hr capsule Take 1 capsule (20 mg total) by mouth daily. 30 capsule 0   amphetamine -dextroamphetamine  (ADDERALL XR) 20 MG 24 hr capsule Take 1 capsule by mouth daily 30 capsule 0   amphetamine -dextroamphetamine  (ADDERALL XR) 20 MG 24 hr capsule Take 1 capsule (20 mg total) by mouth daily. 30 capsule 0   amphetamine -dextroamphetamine  (ADDERALL XR) 20 MG 24 hr capsule Take 1 capsule (20 mg total) by mouth daily. 30 capsule 0   amphetamine -dextroamphetamine  (ADDERALL XR) 30 MG 24 hr capsule Take 1 capsule (30 mg total) by mouth daily. 30 capsule 0   amphetamine -dextroamphetamine  (ADDERALL XR) 30 MG 24 hr capsule Take 1 capsule by mouth daily 30 capsule 0   amphetamine -dextroamphetamine  (ADDERALL XR) 30 MG 24 hr capsule Take 1 capsule (30 mg total) by mouth daily. 30 capsule 0   amphetamine -dextroamphetamine  (ADDERALL XR) 30 MG 24 hr capsule Take 1 capsule (30 mg total) by mouth daily. 30 capsule 0   amphetamine -dextroamphetamine  (ADDERALL XR) 30 MG 24 hr capsule Take 1 capsule by mouth daily 30 capsule 0   amphetamine -dextroamphetamine  (ADDERALL) 10 MG tablet Take 1 tablet by mouth daily in the afternoon. May be filled 30 days after date prescribed 30 tablet 0   amphetamine -dextroamphetamine  (ADDERALL) 10 MG tablet Take 1 tablet (10 mg total) by mouth daily in the afternoon. 30 tablet 0   amphetamine -dextroamphetamine  (ADDERALL) 10 MG tablet Take 1 tablet (10 mg total) by mouth daily in the afternoon. 30 tablet 0   amphetamine -dextroamphetamine  (ADDERALL) 10 MG tablet Take 1 tablet (10 mg total) by mouth daily in the afternoon 30 tablet 0   amphetamine -dextroamphetamine  (ADDERALL) 10 MG tablet Take 1 tablet (10 mg total) by mouth daily in the afternoon 30 tablet 0   amphetamine -dextroamphetamine  (ADDERALL) 10 MG tablet Take 1 tablet (10 mg total) by mouth daily in the  afternoon 30 tablet 0   cholecalciferol  (VITAMIN D3) 25 MCG (1000 UT) tablet Take 1,000 Units by mouth daily.     Cyanocobalamin  (VITAMIN B-12 PO) Take 1 tablet by mouth once a week. Tuesdays     Dapagliflozin  Pro-metFORMIN  ER (XIGDUO  XR) 11-998 MG TB24 TAKE 1 TABLET TWICE DAILY. 90 tablet 1   diazepam  (VALIUM ) 5 MG tablet TAKE 1 TABLET BY MOUTH EVERY DAY AS NEEDED 20 tablet 0   diltiazem  (CARTIA  XT) 240 MG 24 hr capsule Take 1 capsule (240 mg total) by mouth daily. 90 capsule 2   EPINEPHrine  0.3 mg/0.3 mL IJ SOAJ injection Inject 0.3 mg into the muscle once as needed. Allergic reaction 1 each 3   famotidine  (PEPCID )  20 MG tablet Take 20 mg by mouth as needed for heartburn or indigestion.     levocetirizine (XYZAL ) 5 MG tablet TAKE 1 TABLET BY MOUTH EVERY DAY IN THE EVENING 90 tablet 1   Levothyroxine  Sodium 175 MCG/ML SOLN Take 175 mcg by mouth daily. 30 mL 5   Magnesium  500 MG CAPS Take 500 mg by mouth daily.     pravastatin  (PRAVACHOL ) 80 MG tablet TAKE 1 TABLET BY MOUTH DAILY ON MONDAY THROUGH SUNDAY 75 tablet 2   promethazine  (PHENERGAN ) 25 MG tablet TAKE 1 TABLET BY MOUTH EVERY 8 HOURS AS NEEDED 30 tablet 0   Rimegepant Sulfate (NURTEC) 75 MG TBDP Take 1 tablet (75 mg total) by mouth daily as needed. 10 tablet 2   Semaglutide , 2 MG/DOSE, (OZEMPIC , 2 MG/DOSE,) 8 MG/3ML SOPN Inject 2mg  sq weekly 9 mL 2   TRINTELLIX  20 MG TABS tablet TAKE 1 TABLET BY MOUTH EVERY DAY 90 tablet 1   valsartan -hydrochlorothiazide  (DIOVAN -HCT) 160-25 MG tablet TAKE 1 TABLET BY MOUTH EVERY DAY 90 tablet 1   No current facility-administered medications on file prior to visit.    LABS/IMAGING: No results found for this or any previous visit (from the past 48 hours). No results found.  LIPID PANEL:    Component Value Date/Time   CHOL 168 11/27/2023 1052   TRIG 129 11/27/2023 1052   HDL 43 11/27/2023 1052   CHOLHDL 3.9 11/27/2023 1052   LDLCALC 102 (H) 11/27/2023 1052    Lipoprotein (a)  Date/Time Value  Ref Range Status  11/27/2023 10:52 AM 124.7 (H) <75.0 nmol/L Final    Comment:    Note:  Values greater than or equal to 75.0 nmol/L may        indicate an independent risk factor for CHD,        but must be evaluated with caution when applied        to non-Caucasian populations due to the        influence of genetic factors on Lp(a) across        ethnicities.      WEIGHTS: Wt Readings from Last 3 Encounters:  03/12/24 204 lb (92.5 kg)  11/27/23 216 lb (98 kg)  09/02/23 216 lb 9.6 oz (98.2 kg)    VITALS: BP 132/76 (BP Location: Right Arm, Patient Position: Sitting, Cuff Size: Normal)   Pulse 91   Ht 5' 4 (1.626 m)   Wt 204 lb (92.5 kg)   SpO2 94%   BMI 35.02 kg/m   EXAM: Deferred  EKG: Deferred  ASSESSMENT: Dyslipidemia, goal LDL less than 70 Elevated OE(j)-875.2 nmol/L CAC score of 282, 64th percentile (11/2023) Hepatic steatosis  PLAN: 1.   Ms. Neuser has a dyslipidemia with a target LDL less than 70.  She was noted also to have an elevated LP(a).  She is currently not at goal on max dose pravastatin  which she is tolerating.  I advised adding Repatha  to her current regimen.  This should help her reach her target LDL and may give her up to 20 to 30% reduction in LP(a).  She seems asymptomatic although does have an elevated calcium  score.  I encouraged continued exercise, weight loss and healthy low-fat diet.  Although she had no imaging evidence of hepatic steatosis, she has not had any elevated liver enzymes on her statin therapy.  Plan follow-up with repeat lipids including NMR and LP(a) in about 3 to 4 months.  Thanks again for the kind referral.  Brittany KYM Maxcy,  MD, [facility], FNLA, FACP  [place]  [facility]  Medical Director of the Advanced Lipid Disorders &  Cardiovascular Risk Reduction Clinic Diplomate of the American Board of Clinical Lipidology Attending Cardiologist  Direct Dial: [phone]  Fax: [phone]  Website:   [email or web address]  Brittany Rubio 03/12/2024, 2:16 PM

## 2024-03-12 NOTE — Addendum Note (Signed)
 Addended by: GLADIS PORTER HERO on: 03/12/2024 05:45 PM   Modules accepted: Orders

## 2024-03-12 NOTE — Telephone Encounter (Signed)
 Repatha  prescription sent to [facility].   Left a detailed message on pts cell number about PA approval and repatha  script sent to [facility] for delivery.  Advised in the message to call back with any additional questions/concerns regarding this matter.

## 2024-03-12 NOTE — Patient Instructions (Addendum)
 Medication Instructions:   Dr. Mona recommends Repatha  (PCSK9). This is an injectable cholesterol medication self-administered once every 14 days. This medication will likely need prior approval with your insurance company, which we will work on. If the medication is not approved initially, we may need to do an appeal with your insurance. If approved, we will provide you with copay and cost information. We'll then send the prescription to your pharmacy. We would have you complete another set of fasting labs between 3-4 months to reassess cholesterol.   Repatha  is self-injected once every 14 days in subcutaneous or fatty tissue - such as belly or side/outer/upper thigh. It is best stored in the refrigerator but is stable at room temp up to 28 days. Please take the pen-injector out of fridge about 30 minutes - 1 hour prior to injection, to allow it to warm closer to room temperature.   This medication is very effective in lowering LDL and can lower LPa, as Dr. Mona mentioned. It is also generally well tolerated -- most common reaction may be cold-like symptoms such as runny nose, scratchy throat, as this is an antibody therapy. It is generally self-limiting and after a few doses, your body should have normalized to the medication.   Here is a demo video: https://www.repatha .com/how-to-start-repatha -injection   If you need a co-pay card for Repatha : https://www.repatha .com/repatha -cost If you need a co-pay card for Praluent: [email or web address]://[email or web address]  Patient Assistance:    These foundations have funds at various times.   The PAN Foundation: https://www.panfoundation.org/disease-funds/hypercholesterolemia/ -- can sign up for wait list  The [facility] offers assistance to help pay for medication copays.  They will cover copays for all cholesterol lowering meds, including statins, fibrates, omega-3 fish oils like Vascepa, ezetimibe, Repatha , Praluent, Nexletol,  Nexlizet.  The cards are usually good for $2,500 or 12 months, whichever comes first. Our fax # is [phone] (you will need this to apply) Go to healthwellfoundation.org Click on "Apply Now" Answer questions as to whom is applying (patient or representative) Your disease fund will be "hypercholesterolemia - Medicare access" They will ask questions about finances and which medications you are taking for cholesterol When you submit, the approval is usually within minutes.  You will need to print the card information from the site You will need to show this information to your pharmacy, they will bill your Medicare Part D plan first -then bill Health Well --for the copay.   You can also call them at [phone], although the hold times can be quite long.     *If you need a refill on your cardiac medications before your next appointment, please call your pharmacy*   Lab Work:  IN 3 MONTHS HERE AT OUR [facility] ON THE 3RD FLOOR [address]---NMR LIPOPROFILE AND LIPOPROTEIN A--PLEASE COME FASTING TO THIS LAB APPOINTMENT  If you have labs (blood work) drawn today and your tests are completely normal, you will receive your results only by: MyChart Message (if you have MyChart) OR A paper copy in the mail If you have any lab test that is abnormal or we need to change your treatment, we will call you to review the results.   Follow-Up:  AS NEEDED WITH DR. HILTY HERE IN LIPID CLINIC   Other Instructions  Subcutaneous Injection Instructions Using a Prefilled Syringe A subcutaneous injection is a shot of medicine that is given into the layer of fat and tissue between skin and muscle. The injection is given with a single-use syringe that is already filled with medicine.  The syringe is called a prefilled syringe. Read the medicine guide or package insert that came with the syringe. Follow directions from the guide about how to prepare and give the injection. This is important because the directions  may be different for each medicine. Use only the syringe, needle, and medicine that your health care provider prescribes. Use each prefilled syringe and needle only one time. Supplies needed: Prefilled syringe with needle. Use the needle length and size (gauge) that your provider or pharmacist gives to you. Alcohol wipes. Gauze. Bandage. A container to put used syringes. This may be a sharps container or a hard plastic container that has a secure lid, such as an empty laundry detergent bottle. How to choose a site for injection Follow instructions from your provider about where to give an injection. Do not inject in the same spot each time. There are five main areas that can be used for injecting. These areas include: Abdomen. Avoid the area that is within 2 inches (5 cm) of your navel (umbilicus). Front of thigh. Upper, outer side of thigh. Upper, outer side of arm. Upper, outer part of butt. How to give an injection using a prefilled syringe  Wash your hands with soap and water . If soap and water  are not available, use hand sanitizer. Use an alcohol wipe to clean the site where you will be injecting the needle. Let the site air-dry. Remove the plastic cover from the needle on the syringe. Do not let the needle touch anything. Hold the syringe with the needle pointing up. Check the syringe for any remaining air bubbles. If there are air bubbles, flick the syringe with your finger until the air bubbles rise to the top. Then, gently push on the plunger until you can see a drop of medicine appear at the tip of the needle. This will clear any remaining air bubbles from the syringe. Hold the syringe in your writing hand like a pencil. Use your other hand to pinch and hold about an inch (2.5 cm) of skin. Do not directly touch the cleaned part of the skin. Gently but quickly, put the needle straight into the skin. The needle should be at a 90-degree angle to the skin. The needle may need to be  injected at a 45-degree angle in thin adults or children who have a small amount of body fat. Follow instructions from your provider about the right size needle and angle you should use for the injection. After the needle is completely inserted into the skin, release the skin that you are pinching. Continue to hold the syringe with your writing hand. Use your thumb or index finger of your writing hand to push the plunger all the way into the syringe to inject the medicine. Pull the needle straight out of the skin. If there is bleeding: Press and hold a piece of gauze over the injection site until bleeding stops. Do not rub the area. Cover the injection site with a bandage, if needed. How to safely throw away the supplies If you are using a syringe that does not have a safety system for shielding the needle after injection: Do not recap the needle. Place the syringe and needle in the disposal container. If your syringe has a safety system for shielding the needle after injection: Firmly push down on the plunger after you complete the injection. The protective sleeve will automatically cover the needle, and you will hear a click. The click means that the needle is safely covered. Follow  the disposal regulations for the area where you live. Do not use any syringe or needle more than one time. Contact a health care provider if: You have trouble giving the injection. You think that the injection was not given correctly. You have trouble with any of the supplies. The medicine causes side effects. You get a rash on your skin. A get a fever. The condition that is being treated gets worse. Get help right away if: You get any of these symptoms after the injection is given: Trouble breathing. Chest pain. A rash over most or all of your body. Swelling of the lips or tongue. Trouble swallowing. These symptoms may be an emergency. Get help right away. Call 911. Do not wait to see if the symptoms will  go away. Do not drive yourself to the hospital. This information is not intended to replace advice given to you by your health care provider. Make sure you discuss any questions you have with your health care provider. Document Revised: 04/19/2022 Document Reviewed: 04/19/2022 Elsevier Patient Education  2024 [employer].

## 2024-03-12 NOTE — Telephone Encounter (Signed)
 Pharmacy Patient Advocate Encounter   Received notification from STAFF that prior authorization for REPATHA  is required/requested.   Insurance verification completed.   The patient is insured through [facility] .   Per test claim: PA required; PA submitted to above mentioned insurance via Latent Key/confirmation #/EOC [facility] Status is pending   From staff

## 2024-03-12 NOTE — Telephone Encounter (Signed)
 Pharmacy Patient Advocate Encounter  Received notification from [facility] that Prior Authorization for REPATHA  has been APPROVED from 03/12/24 to 03/12/25   PA #/Case ID/Reference #: [id number]

## 2024-03-19 ENCOUNTER — Other Ambulatory Visit: Payer: Self-pay

## 2024-03-19 ENCOUNTER — Other Ambulatory Visit (HOSPITAL_COMMUNITY): Payer: Self-pay

## 2024-03-19 NOTE — Patient Instructions (Signed)
 Visit Information  Brittany Rubio - I am sorry I was unable to reach you today for our scheduled appointment. I work with Jarold Medici, MD and am calling to support your healthcare needs. Please contact me at [phone] at your earliest convenience. I look forward to speaking with you soon.    Your next care management appointment is no further scheduled appointments.   We have been unable to make contact with the patient x 3 attempts. Closing from Complex Care Management..  Please call the care guide team at [phone] if you need to cancel, schedule, or reschedule an appointment.   Please call 911 if you are experiencing a Mental Health or Behavioral Health Crisis or need someone to talk to.  Olam Ally, MSW, LCSW [place]  [facility], [facility] Licensed Clinical Social Worker Direct Dial: [phone]

## 2024-03-19 NOTE — Patient Outreach (Addendum)
 LCSW attempted to reach patient for the 3rd unsuccessful attempt. LCSW left contact information and informed patient that patient would be dis-enrolled in services due to unsuccessful outreaches. LCSW will dis-enroll  patient from services.  Olam Ally, MSW, LCSW [place]  [facility], [facility] Licensed Clinical Social Worker Direct Dial: [phone]

## 2024-03-24 ENCOUNTER — Other Ambulatory Visit: Payer: Self-pay | Admitting: Internal Medicine

## 2024-04-22 ENCOUNTER — Other Ambulatory Visit (HOSPITAL_COMMUNITY): Payer: Self-pay

## 2024-04-25 ENCOUNTER — Other Ambulatory Visit (HOSPITAL_COMMUNITY): Payer: Self-pay

## 2024-04-25 MED ORDER — AMPHETAMINE-DEXTROAMPHET ER 30 MG PO CP24
30.0000 mg | ORAL_CAPSULE | Freq: Every day | ORAL | 0 refills | Status: DC
  Filled 2024-04-25: qty 30, 30d supply, fill #0

## 2024-04-25 MED ORDER — AMPHETAMINE-DEXTROAMPHET ER 20 MG PO CP24
20.0000 mg | ORAL_CAPSULE | Freq: Every day | ORAL | 0 refills | Status: DC
  Filled 2024-04-25: qty 30, 30d supply, fill #0

## 2024-04-25 MED ORDER — AMPHETAMINE-DEXTROAMPHETAMINE 10 MG PO TABS
10.0000 mg | ORAL_TABLET | Freq: Every day | ORAL | 0 refills | Status: DC
  Filled 2024-04-25: qty 30, 30d supply, fill #0

## 2024-05-26 ENCOUNTER — Other Ambulatory Visit: Payer: Self-pay

## 2024-05-26 ENCOUNTER — Other Ambulatory Visit (HOSPITAL_COMMUNITY): Payer: Self-pay

## 2024-05-26 MED ORDER — AMPHETAMINE-DEXTROAMPHET ER 30 MG PO CP24
30.0000 mg | ORAL_CAPSULE | Freq: Every day | ORAL | 0 refills | Status: DC
  Filled 2024-05-26: qty 30, 30d supply, fill #0

## 2024-05-26 MED ORDER — AMPHETAMINE-DEXTROAMPHETAMINE 10 MG PO TABS
10.0000 mg | ORAL_TABLET | Freq: Every day | ORAL | 0 refills | Status: DC
  Filled 2024-07-03: qty 30, 30d supply, fill #0

## 2024-05-26 MED ORDER — AMPHETAMINE-DEXTROAMPHET ER 20 MG PO CP24
20.0000 mg | ORAL_CAPSULE | Freq: Every day | ORAL | 0 refills | Status: DC
  Filled 2024-05-26: qty 30, 30d supply, fill #0

## 2024-05-26 MED ORDER — AMPHETAMINE-DEXTROAMPHETAMINE 10 MG PO TABS: 10.0000 mg | ORAL_TABLET | Freq: Every day | ORAL | 0 refills | Status: AC

## 2024-05-26 MED ORDER — AMPHETAMINE-DEXTROAMPHET ER 20 MG PO CP24
20.0000 mg | ORAL_CAPSULE | Freq: Every day | ORAL | 0 refills | Status: DC
  Filled 2024-07-03: qty 30, 30d supply, fill #0

## 2024-05-26 MED ORDER — AMPHETAMINE-DEXTROAMPHET ER 30 MG PO CP24: 30.0000 mg | ORAL_CAPSULE | Freq: Every day | ORAL | 0 refills | Status: AC

## 2024-05-26 MED ORDER — AMPHETAMINE-DEXTROAMPHETAMINE 10 MG PO TABS
10.0000 mg | ORAL_TABLET | Freq: Every day | ORAL | 0 refills | Status: DC
  Filled 2024-05-26: qty 30, 30d supply, fill #0

## 2024-05-26 MED ORDER — AMPHETAMINE-DEXTROAMPHET ER 30 MG PO CP24
30.0000 mg | ORAL_CAPSULE | Freq: Every day | ORAL | 0 refills | Status: AC
  Filled 2024-07-03: qty 30, 30d supply, fill #0

## 2024-05-26 MED ORDER — AMPHETAMINE-DEXTROAMPHET ER 20 MG PO CP24: 20.0000 mg | ORAL_CAPSULE | Freq: Every day | ORAL | 0 refills | Status: AC

## 2024-06-13 ENCOUNTER — Other Ambulatory Visit: Payer: Self-pay | Admitting: Internal Medicine

## 2024-06-13 DIAGNOSIS — E1122 Type 2 diabetes mellitus with diabetic chronic kidney disease: Secondary | ICD-10-CM

## 2024-06-22 ENCOUNTER — Other Ambulatory Visit: Payer: Self-pay | Admitting: Internal Medicine

## 2024-06-22 ENCOUNTER — Ambulatory Visit
Admission: RE | Admit: 2024-06-22 | Discharge: 2024-06-22 | Disposition: A | Source: Ambulatory Visit | Attending: Internal Medicine | Admitting: Internal Medicine

## 2024-06-22 ENCOUNTER — Ambulatory Visit: Payer: Self-pay | Admitting: Internal Medicine

## 2024-06-22 VITALS — BP 122/78 | HR 98 | Temp 98.1°F | Ht 64.0 in | Wt 207.0 lb

## 2024-06-22 DIAGNOSIS — Z23 Encounter for immunization: Secondary | ICD-10-CM

## 2024-06-22 DIAGNOSIS — I2510 Atherosclerotic heart disease of native coronary artery without angina pectoris: Secondary | ICD-10-CM | POA: Diagnosis not present

## 2024-06-22 DIAGNOSIS — I1310 Hypertensive heart and chronic kidney disease without heart failure, with stage 1 through stage 4 chronic kidney disease, or unspecified chronic kidney disease: Secondary | ICD-10-CM | POA: Diagnosis not present

## 2024-06-22 DIAGNOSIS — E890 Postprocedural hypothyroidism: Secondary | ICD-10-CM | POA: Diagnosis not present

## 2024-06-22 DIAGNOSIS — Z0000 Encounter for general adult medical examination without abnormal findings: Secondary | ICD-10-CM

## 2024-06-22 DIAGNOSIS — Z85850 Personal history of malignant neoplasm of thyroid: Secondary | ICD-10-CM

## 2024-06-22 DIAGNOSIS — R000 Tachycardia, unspecified: Secondary | ICD-10-CM

## 2024-06-22 DIAGNOSIS — N182 Chronic kidney disease, stage 2 (mild): Secondary | ICD-10-CM | POA: Diagnosis not present

## 2024-06-22 DIAGNOSIS — I119 Hypertensive heart disease without heart failure: Secondary | ICD-10-CM

## 2024-06-22 DIAGNOSIS — E7800 Pure hypercholesterolemia, unspecified: Secondary | ICD-10-CM | POA: Diagnosis not present

## 2024-06-22 DIAGNOSIS — E1122 Type 2 diabetes mellitus with diabetic chronic kidney disease: Secondary | ICD-10-CM

## 2024-06-22 DIAGNOSIS — Z1231 Encounter for screening mammogram for malignant neoplasm of breast: Secondary | ICD-10-CM

## 2024-06-22 DIAGNOSIS — I2584 Coronary atherosclerosis due to calcified coronary lesion: Secondary | ICD-10-CM | POA: Diagnosis not present

## 2024-06-22 DIAGNOSIS — E2839 Other primary ovarian failure: Secondary | ICD-10-CM

## 2024-06-22 DIAGNOSIS — I10 Essential (primary) hypertension: Secondary | ICD-10-CM

## 2024-06-22 LAB — POCT URINALYSIS DIPSTICK
Blood, UA: NEGATIVE
Glucose, UA: POSITIVE — AB
Leukocytes, UA: NEGATIVE
Nitrite, UA: NEGATIVE
Protein, UA: POSITIVE — AB
Spec Grav, UA: >=1.03 — AB (ref 1.010–1.025)
Urobilinogen, UA: 0.2 U/dL
pH, UA: 5.5 (ref 5.0–8.0)

## 2024-06-22 MED ORDER — PROMETHAZINE HCL 25 MG PO TABS: ORAL_TABLET | ORAL | 0 refills | Status: AC

## 2024-06-22 NOTE — Patient Instructions (Signed)

## 2024-06-22 NOTE — Progress Notes (Unsigned)
 I,Brittany Rubio, CMA,acting as a neurosurgeon for Brittany LOISE Slocumb, MD.,have documented all relevant documentation on the behalf of Brittany LOISE Slocumb, MD,as directed by  Brittany LOISE Slocumb, MD while in the presence of Brittany LOISE Slocumb, MD.  Subjective:    Patient ID: Brittany Rubio , female    DOB: [birth year] , 63 y.o.   MRN: [id number]  Chief Complaint  Patient presents with   Annual Exam    Patient presents today for annual exam. She reports compliance with medications. Denies headache, chest pain & sob.  She would like a refill on her Nurtec & Promethazine .    Diabetes   Hypertension    HPI Discussed the use of AI scribe software for clinical note transcription with the patient, who gave verbal consent to proceed.  History of Present Illness Brittany Rubio is a 63 year old female with diabetes who presents for a physical and diabetes check.  The last recorded A1c was in May, and she is not currently checking her blood sugars regularly.  She is on multiple medications including Adderall, levothyroxine  175 mcg daily, pravastatin , Nurtec as needed, Ozempic  twice weekly, valsartan  160/25 mg, and Zykadia once daily. There is confusion about the Zykadia dosage as her bottle indicates once daily, but she recalls it should be twice daily. She is also taking Xigduo  and inquires about taking Janumet  when she runs out of Xigduo . She has not started Repatha  due to an issue with pharmacy approval.  She has a past thyroid  condition and is currently on levothyroxine . She was previously seeing Dr. Balan for her thyroid  but has not had a follow-up scheduled, as she recalls being told not to return after being clear of cancer markers.  She experiences occasional nausea and requests promethazine  for it, although it is not frequent. No dizziness and she drinks water  regularly.  Her social situation is complex, with a busy household including a sister who recently fell and a mother-in-law requiring hospice  care. She describes her life as 'upside down' and works in a school with a new principal.  She has a history of osteopenia and has not had a bone density test this year. She typically gets her mammograms at the breast center and is due for another one.   Diabetes She presents for her follow-up diabetic visit. She has type 2 diabetes mellitus. Her disease course has been stable. Pertinent negatives for diabetes include no blurred vision. There are no hypoglycemic complications. Diabetic complications include nephropathy. Risk factors for coronary artery disease include diabetes mellitus, dyslipidemia, hypertension, obesity, sedentary lifestyle, post-menopausal and stress. Her weight is fluctuating minimally. She is following a diabetic diet. She participates in exercise intermittently. Her home blood glucose trend is increasing steadily. Her breakfast blood glucose is taken between 8-9 am. Her breakfast blood glucose range is generally 140-180 mg/dl. An ACE inhibitor/angiotensin II receptor blocker is being taken. Eye exam is current.  Hypertension This is a chronic problem. The current episode started more than 1 year ago. The problem has been gradually improving since onset. The problem is uncontrolled. Pertinent negatives include no blurred vision. The current treatment provides moderate improvement. Compliance problems include exercise.  Hypertensive end-organ damage includes kidney disease.     Past Medical History:  Diagnosis Date   ADD (attention deficit disorder)    Anemia    Anxiety    Arthritis    left shoulder (07/07/2014)   Back pain    Breast cancer (HCC)    left  Depression    Fatty liver    Food allergy    shellfish   GERD (gastroesophageal reflux disease)    takes over the counters as needed   Hernia, umbilical    High cholesterol    Hypertension 11/26/2011   sees Dr. Grayce Rubio   Hypothyroidism    Incisional hernia without obstruction or gangrene 06/05/2018    Joint pain    Migraines    maybe once q 3 months (07/07/2014)   Multinodular thyroid  2015   OSA on CPAP    Personal history of radiation therapy    Pneumonia    May 2018   Sepsis due to Streptococcus pneumoniae (HCC)    Septic shock (HCC)    Severe sepsis (HCC) 06/05/2019   Thyroid  cancer (HCC)    2015   Type II diabetes mellitus (HCC)    Vitamin D  deficiency      Family History  Problem Relation Age of Onset   Cancer Mother        Pancreatic   Diabetes Mother    Hypertension Mother    Depression Mother    Hypertension Father    Alcoholism Father      Current Outpatient Medications:    amphetamine -dextroamphetamine  (ADDERALL XR) 20 MG 24 hr capsule, Take 1 capsule (20 mg total) by mouth daily., Disp: 30 capsule, Rfl: 0   amphetamine -dextroamphetamine  (ADDERALL XR) 20 MG 24 hr capsule, Take 1 capsule (20 mg total) by mouth daily., Disp: 30 capsule, Rfl: 0   amphetamine -dextroamphetamine  (ADDERALL XR) 20 MG 24 hr capsule, Take 1 capsule (20 mg total) by mouth daily., Disp: 30 capsule, Rfl: 0   amphetamine -dextroamphetamine  (ADDERALL XR) 20 MG 24 hr capsule, Take 1 capsule (20 mg total) by mouth daily. 30 days after date prescribed, Disp: 30 capsule, Rfl: 0   amphetamine -dextroamphetamine  (ADDERALL XR) 20 MG 24 hr capsule, Take 1 capsule (20 mg total) by mouth daily. 60 days after date prescribed, Disp: 30 capsule, Rfl: 0   amphetamine -dextroamphetamine  (ADDERALL XR) 30 MG 24 hr capsule, Take 1 capsule by mouth daily, Disp: 30 capsule, Rfl: 0   amphetamine -dextroamphetamine  (ADDERALL XR) 30 MG 24 hr capsule, Take 1 capsule (30 mg total) by mouth daily., Disp: 30 capsule, Rfl: 0   amphetamine -dextroamphetamine  (ADDERALL XR) 30 MG 24 hr capsule, Take 1 capsule (30 mg total) by mouth daily., Disp: 30 capsule, Rfl: 0   amphetamine -dextroamphetamine  (ADDERALL XR) 30 MG 24 hr capsule, Take 1 capsule by mouth daily, Disp: 30 capsule, Rfl: 0   amphetamine -dextroamphetamine  (ADDERALL XR)  30 MG 24 hr capsule, Take 1 capsule (30 mg total) by mouth daily., Disp: 30 capsule, Rfl: 0   amphetamine -dextroamphetamine  (ADDERALL XR) 30 MG 24 hr capsule, Take 1 capsule (30 mg total) by mouth daily. 60 days after date prescribed, Disp: 30 capsule, Rfl: 0   amphetamine -dextroamphetamine  (ADDERALL XR) 30 MG 24 hr capsule, Take 1 capsule (30 mg total) by mouth daily. 30 days after date prescribed, Disp: 30 capsule, Rfl: 0   amphetamine -dextroamphetamine  (ADDERALL) 10 MG tablet, Take 1 tablet by mouth daily in the afternoon. May be filled 30 days after date prescribed, Disp: 30 tablet, Rfl: 0   amphetamine -dextroamphetamine  (ADDERALL) 10 MG tablet, Take 1 tablet (10 mg total) by mouth daily in the afternoon., Disp: 30 tablet, Rfl: 0   amphetamine -dextroamphetamine  (ADDERALL) 10 MG tablet, Take 1 tablet (10 mg total) by mouth daily in the afternoon, Disp: 30 tablet, Rfl: 0   amphetamine -dextroamphetamine  (ADDERALL) 10 MG tablet, Take 1 tablet (10  mg total) by mouth daily in the afternoon, Disp: 30 tablet, Rfl: 0   amphetamine -dextroamphetamine  (ADDERALL) 10 MG tablet, Take 1 tablet (10 mg total) by mouth daily in the afternoon, Disp: 30 tablet, Rfl: 0   amphetamine -dextroamphetamine  (ADDERALL) 10 MG tablet, Take 1 tablet (10 mg total) by mouth daily in the afternoon. 60 days after date prescribed, Disp: 30 tablet, Rfl: 0   amphetamine -dextroamphetamine  (ADDERALL) 10 MG tablet, Take 1 tablet (10 mg total) by mouth daily in the afternoon., Disp: 30 tablet, Rfl: 0   amphetamine -dextroamphetamine  (ADDERALL) 10 MG tablet, Take 1 tablet (10 mg total) by mouth daily in the afternoon. 30 days after date prescribed, Disp: 30 tablet, Rfl: 0   cholecalciferol  (VITAMIN D3) 25 MCG (1000 UT) tablet, Take 1,000 Units by mouth daily., Disp: , Rfl:    Cyanocobalamin  (VITAMIN B-12 PO), Take 1 tablet by mouth once a week. Tuesdays, Disp: , Rfl:    diazepam  (VALIUM ) 5 MG tablet, TAKE 1 TABLET BY MOUTH EVERY DAY AS NEEDED,  Disp: 20 tablet, Rfl: 0   diltiazem  (CARTIA  XT) 240 MG 24 hr capsule, Take 1 capsule (240 mg total) by mouth daily., Disp: 90 capsule, Rfl: 2   EPINEPHrine  0.3 mg/0.3 mL IJ SOAJ injection, Inject 0.3 mg into the muscle once as needed. Allergic reaction, Disp: 1 each, Rfl: 3   Evolocumab  (REPATHA  SURECLICK) 140 MG/ML SOAJ, Inject 140 mg into the skin every 14 (fourteen) days., Disp: 2 mL, Rfl: 6   famotidine  (PEPCID ) 20 MG tablet, Take 20 mg by mouth as needed for heartburn or indigestion., Disp: , Rfl:    levocetirizine (XYZAL ) 5 MG tablet, TAKE 1 TABLET BY MOUTH EVERY DAY IN THE EVENING, Disp: 90 tablet, Rfl: 1   Levothyroxine  Sodium 175 MCG/ML SOLN, Take 175 mcg by mouth daily., Disp: 30 mL, Rfl: 5   Magnesium  500 MG CAPS, Take 500 mg by mouth daily., Disp: , Rfl:    pravastatin  (PRAVACHOL ) 80 MG tablet, TAKE 1 TABLET BY MOUTH DAILY ON MONDAY THROUGH SUNDAY, Disp: 75 tablet, Rfl: 2   Rimegepant Sulfate (NURTEC) 75 MG TBDP, Take 1 tablet (75 mg total) by mouth daily as needed., Disp: 10 tablet, Rfl: 2   Semaglutide , 2 MG/DOSE, (OZEMPIC , 2 MG/DOSE,) 8 MG/3ML SOPN, Inject 2mg  sq weekly, Disp: 9 mL, Rfl: 2   TRINTELLIX  20 MG TABS tablet, TAKE 1 TABLET BY MOUTH EVERY DAY, Disp: 90 tablet, Rfl: 1   valsartan -hydrochlorothiazide  (DIOVAN -HCT) 160-25 MG tablet, TAKE 1 TABLET BY MOUTH EVERY DAY, Disp: 90 tablet, Rfl: 1   XIGDUO  XR 11-998 MG TB24, TAKE 1 TABLET BY MOUTH TWICE A DAY, Disp: 60 tablet, Rfl: 2   promethazine  (PHENERGAN ) 25 MG tablet, TAKE 1 TABLET BY MOUTH EVERY 8 HOURS AS NEEDED, Disp: 30 tablet, Rfl: 0   Allergies  Allergen Reactions   Crab [Shellfish Allergy] Hives and Swelling    Only when I eat too many crab legs Can tolerate CT scans with contrast-does not need premeds        The patient states she uses post menopausal status for birth control. No LMP recorded. Patient has had a hysterectomy.. Negative for Dysmenorrhea. Negative for: breast discharge, breast lump(s), breast pain  and breast self exam. Associated symptoms include abnormal vaginal bleeding. Pertinent negatives include abnormal bleeding (hematology), anxiety, decreased libido, depression, difficulty falling sleep, dyspareunia, history of infertility, nocturia, sexual dysfunction, sleep disturbances, urinary incontinence, urinary urgency, vaginal discharge and vaginal itching. Diet regular.The patient states her exercise level is  intermittent.  SABRA  The patient's tobacco use is:  Social History   Tobacco Use  Smoking Status Never  Smokeless Tobacco Never  . She has been exposed to passive smoke. The patient's alcohol use is:  Social History   Substance and Sexual Activity  Alcohol Use Yes   Comment: 07/07/2014 a few times/year; weddings, anniversary, etc    Review of Systems  Constitutional: Negative.   HENT: Negative.    Eyes: Negative.  Negative for blurred vision.  Respiratory: Negative.    Cardiovascular: Negative.   Gastrointestinal: Negative.   Endocrine: Negative.   Genitourinary: Negative.   Musculoskeletal: Negative.   Skin: Negative.   Allergic/Immunologic: Negative.   Hematological: Negative.   Psychiatric/Behavioral: Negative.       Today's Vitals   06/22/24 1134  BP: 122/78  Pulse: 98  Temp: 98.1 F (36.7 C)  SpO2: 98%  Weight: 207 lb (93.9 kg)  Height: 5' 4 (1.626 m)   Body mass index is 35.53 kg/m.  Wt Readings from Last 3 Encounters:  06/22/24 207 lb (93.9 kg)  03/12/24 204 lb (92.5 kg)  11/27/23 216 lb (98 kg)     Objective:  Physical Exam Vitals and nursing note reviewed.  Constitutional:      Appearance: Normal appearance. She is obese.  HENT:     Head: Normocephalic and atraumatic.     Right Ear: Tympanic membrane, ear canal and external ear normal. There is no impacted cerumen.     Left Ear: Tympanic membrane, ear canal and external ear normal. There is no impacted cerumen.     Mouth/Throat:     Pharynx: No oropharyngeal exudate or posterior  oropharyngeal erythema.  Eyes:     Extraocular Movements: Extraocular movements intact.     Conjunctiva/sclera: Conjunctivae normal.     Pupils: Pupils are equal, round, and reactive to light.  Cardiovascular:     Rate and Rhythm: Regular rhythm. Tachycardia present.     Pulses:          Dorsalis pedis pulses are 2+ on the right side and 2+ on the left side.     Heart sounds: Normal heart sounds.  Pulmonary:     Effort: Pulmonary effort is normal.     Breath sounds: Normal breath sounds.  Chest:  Breasts:    Tanner Score is 5.     Right: Normal.     Left: Normal.       Comments: Healed surgical scars b/l Left breast w/ healed surgical scar Abdominal:     General: Bowel sounds are normal.     Palpations: Abdomen is soft.     Comments: Obese, soft.   Genitourinary:    Comments: Deferred  Musculoskeletal:     Cervical back: Normal range of motion.  Feet:     Right foot:     Protective Sensation: 5 sites tested.  5 sites sensed.     Skin integrity: Dry skin present.     Toenail Condition: Right toenails are normal.     Left foot:     Protective Sensation: 5 sites tested.  5 sites sensed.     Skin integrity: Dry skin present.     Toenail Condition: Left toenails are normal.  Skin:    General: Skin is warm.  Neurological:     General: No focal deficit present.     Mental Status: She is alert.  Psychiatric:        Mood and Affect: Mood normal.        Behavior:  Behavior normal.         Assessment And Plan:     Annual physical exam Assessment & Plan: A full exam was performed.  Importance of monthly self breast exams was discussed with the patient.  She is advised to get 30-45 minutes of regular exercise, no less than four to five days per week. Both weight-bearing and aerobic exercises are recommended.  She is advised to follow a healthy diet with at least six fruits/veggies per day, decrease intake of red meat and other saturated fats and to increase fish intake to  twice weekly.  Meats/fish should not be fried -- baked, boiled or broiled is preferable. It is also important to cut back on your sugar intake.  Be sure to read labels - try to avoid anything with added sugar, high fructose corn syrup or other sweeteners.  If you must use a sweetener, you can try stevia or monkfruit.  It is also important to avoid artificially sweetened foods/beverages and diet drinks. Lastly, wear SPF 50 sunscreen on exposed skin and when in direct sunlight for an extended period of time.  Be sure to avoid fast food restaurants and aim for at least 60 ounces of water  daily.      Orders: -     CBC -     CMP14+EGFR -     Lipid panel -     Hemoglobin A1c  Type 2 diabetes mellitus with stage 2 chronic kidney disease, without long-term current use of insulin  (HCC) Assessment & Plan: Chronic, diabetic foot exam was performed.  Diabetes management is suboptimal with consistently high A1c levels. Importance of dietary and medication compliance was discussed with the patient.  She will continue with Xigduo   5/1000mg  twice daily and semaglutide  2mg  weekly. Emphasized importance of both meds for heart and kidney protection. - she will f/u in 3-4 months.  - I will refer her to [facility] pharmacist for assistance - Consider Endo referral - I DISCUSSED WITH THE PATIENT AT LENGTH REGARDING THE GOALS OF GLYCEMIC CONTROL AND POSSIBLE LONG-TERM COMPLICATIONS.  I  ALSO STRESSED THE IMPORTANCE OF COMPLIANCE WITH HOME GLUCOSE MONITORING, DIETARY RESTRICTIONS INCLUDING AVOIDANCE OF SUGARY DRINKS/PROCESSED FOODS,  ALONG WITH REGULAR EXERCISE.  I  ALSO STRESSED THE IMPORTANCE OF ANNUAL EYE EXAMS, SELF FOOT CARE AND COMPLIANCE WITH OFFICE VISITS.   Orders: -     Hemoglobin A1c -     POCT urinalysis dipstick -     Microalbumin / creatinine urine ratio -     EKG 12-Lead -     AMB Referral [facility]  Hypertensive heart disease without heart failure Assessment & Plan: Chronic, well controlled.   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  She will continue with valsartan /hct 160/25mg  daily and diltiazem  XT 240mg  daily. . Encouraged to follow low sodium diet. - She will follow up in four months.   Orders: -     POCT urinalysis dipstick -     Microalbumin / creatinine urine ratio -     EKG 12-Lead -     AMB Referral [facility]  Coronary artery calcification of native artery Assessment & Plan: Calcium  score is 282. She is encouraged to continue with pravastatin  80mg  daily.  She has been unable to tolerate higher intensity statins in the past. She has also been prescribed Repatha  by Cardiology; however, she states it was never approved by her insurance.  - Refer to [facility] pharmacist for PA assistance.  - Start ASA 81mg  daily. -  Follow heart healthy lifestyle.   Orders: -     ECHOCARDIOGRAM COMPLETE; Future  Sinus tachycardia Assessment & Plan: HR 110 on EKG. She is currently on CCB. She is encouraged to stay well hydrated and avoid caffeinated beverages.  - Consider Zio patch if she becomes asymptomatic - ? Need for Bblockade in addition to CCB   Pure hypercholesterolemia Assessment & Plan: Chronic, LDL goal is less than 55.  Management includes pravastatin  and Repatha . Repatha  not yet started due to pharmacy approval issues. Calcium  score of 282 indicates need for Repatha . - Continue pravastatin  daily. - Referred to pharmacist for Repatha  approval and administration training.   Postoperative hypothyroidism Assessment & Plan: Managed with levothyroxine  175 mcg daily. Last thyroid  function test in 2023 showed TSH <1. No recent follow-up with endocrinologist. - Continue levothyroxine  175 mcg daily. - Ordered TSH and thyroglobulin antibody tests. - Will review thyroid  function test results and adjust medication as needed.   Morbid obesity (HCC) Assessment & Plan: BMI 35 along with DM, HTN. She is encouraged to strive to lose ten percent of her body weight to  decrease cardiac risk.    Immunization due -     Flu vaccine trivalent PF, 6mos and older(Flulaval,Afluria,Fluarix,Fluzone)  History of thyroid  cancer -     TSH + free T4 -     Thyroid  antibodies (Thyroperoxidase & Thyroglobulin)  Other orders -     Promethazine  HCl; TAKE 1 TABLET BY MOUTH EVERY 8 HOURS AS NEEDED  Dispense: 30 tablet; Refill: 0    Return for 1 year physical, 3 month dm check. Patient was given opportunity to ask questions. Patient verbalized understanding of the plan and was able to repeat key elements of the plan. All questions were answered to their satisfaction.   I, Brittany LOISE Slocumb, MD, have reviewed all documentation for this visit. The documentation on 06/22/24 for the exam, diagnosis, procedures, and orders are all accurate and complete.

## 2024-06-23 ENCOUNTER — Ambulatory Visit (HOSPITAL_BASED_OUTPATIENT_CLINIC_OR_DEPARTMENT_OTHER)

## 2024-06-23 LAB — CBC
Hematocrit: 42.7 % (ref 34.0–46.6)
Hemoglobin: 13.1 g/dL (ref 11.1–15.9)
MCH: 23.5 pg — ABNORMAL LOW (ref 26.6–33.0)
MCHC: 30.7 g/dL — ABNORMAL LOW (ref 31.5–35.7)
MCV: 77 fL — ABNORMAL LOW (ref 79–97)
Platelets: 336 x10E3/uL (ref 150–450)
RBC: 5.57 x10E6/uL — ABNORMAL HIGH (ref 3.77–5.28)
RDW: 14.9 % (ref 11.7–15.4)
WBC: 7.5 x10E3/uL (ref 3.4–10.8)

## 2024-06-23 LAB — CMP14+EGFR
ALT: 25 IU/L (ref 0–32)
AST: 22 IU/L (ref 0–40)
Albumin: 4.4 g/dL (ref 3.9–4.9)
Alkaline Phosphatase: 62 IU/L (ref 49–135)
BUN/Creatinine Ratio: 17 (ref 12–28)
BUN: 19 mg/dL (ref 8–27)
Bilirubin Total: 0.2 mg/dL (ref 0.0–1.2)
CO2: 27 mmol/L (ref 20–29)
Calcium: 9.6 mg/dL (ref 8.7–10.3)
Chloride: 97 mmol/L (ref 96–106)
Creatinine, Ser: 1.13 mg/dL — ABNORMAL HIGH (ref 0.57–1.00)
Globulin, Total: 3.5 g/dL (ref 1.5–4.5)
Glucose: 108 mg/dL — ABNORMAL HIGH (ref 70–99)
Potassium: 4.5 mmol/L (ref 3.5–5.2)
Sodium: 140 mmol/L (ref 134–144)
Total Protein: 7.9 g/dL (ref 6.0–8.5)
eGFR: 54 mL/min/1.73 — ABNORMAL LOW (ref >59)

## 2024-06-23 LAB — LIPID PANEL
Chol/HDL Ratio: 3.5 ratio (ref 0.0–4.4)
Cholesterol, Total: 161 mg/dL (ref 100–199)
HDL: 46 mg/dL (ref >39)
LDL Chol Calc (NIH): 90 mg/dL (ref 0–99)
Triglycerides: 144 mg/dL (ref 0–149)
VLDL Cholesterol Cal: 25 mg/dL (ref 5–40)

## 2024-06-23 LAB — MICROALBUMIN / CREATININE URINE RATIO
Creatinine, Urine: 291.6 mg/dL
Microalb/Creat Ratio: 560 mg/g{creat} — ABNORMAL HIGH (ref 0–29)
Microalbumin, Urine: 1633.2 ug/mL

## 2024-06-23 LAB — HEMOGLOBIN A1C
Est. average glucose Bld gHb Est-mCnc: 186 mg/dL
Hgb A1c MFr Bld: 8.1 % — ABNORMAL HIGH (ref 4.8–5.6)

## 2024-06-23 LAB — TSH+FREE T4
Free T4: 2.18 ng/dL — ABNORMAL HIGH (ref 0.82–1.77)
TSH: 0.25 u[IU]/mL — ABNORMAL LOW (ref 0.450–4.500)

## 2024-06-23 LAB — THYROID ANTIBODIES (THYROPEROXIDASE & THYROGLOBULIN)
Thyroglobulin Antibody: <1 [IU]/mL (ref 0.0–0.9)
Thyroperoxidase Ab SerPl-aCnc: 32 [IU]/mL (ref 0–34)

## 2024-06-27 ENCOUNTER — Encounter: Payer: Self-pay | Admitting: Internal Medicine

## 2024-06-27 ENCOUNTER — Ambulatory Visit: Payer: Self-pay | Admitting: Internal Medicine

## 2024-06-27 ENCOUNTER — Other Ambulatory Visit: Payer: Self-pay | Admitting: Internal Medicine

## 2024-06-27 DIAGNOSIS — N1831 Chronic kidney disease, stage 3a: Secondary | ICD-10-CM

## 2024-06-28 DIAGNOSIS — R000 Tachycardia, unspecified: Secondary | ICD-10-CM | POA: Insufficient documentation

## 2024-06-28 DIAGNOSIS — I2510 Atherosclerotic heart disease of native coronary artery without angina pectoris: Secondary | ICD-10-CM | POA: Insufficient documentation

## 2024-06-28 NOTE — Assessment & Plan Note (Signed)
 Managed with levothyroxine  175 mcg daily. Last thyroid  function test in 2023 showed TSH <1. No recent follow-up with endocrinologist. - Continue levothyroxine  175 mcg daily. - Ordered TSH and thyroglobulin antibody tests. - Will review thyroid  function test results and adjust medication as needed.

## 2024-06-28 NOTE — Assessment & Plan Note (Signed)
 Chronic, diabetic foot exam was performed.  Diabetes management is suboptimal with consistently high A1c levels. Importance of dietary and medication compliance was discussed with the patient.  She will continue with Xigduo   5/1000mg  twice daily and semaglutide  2mg  weekly. Emphasized importance of both meds for heart and kidney protection. - she will f/u in 3-4 months.  - I will refer her to [facility] pharmacist for assistance - Consider Endo referral - I DISCUSSED WITH THE PATIENT AT LENGTH REGARDING THE GOALS OF GLYCEMIC CONTROL AND POSSIBLE LONG-TERM COMPLICATIONS.  I  ALSO STRESSED THE IMPORTANCE OF COMPLIANCE WITH HOME GLUCOSE MONITORING, DIETARY RESTRICTIONS INCLUDING AVOIDANCE OF SUGARY DRINKS/PROCESSED FOODS,  ALONG WITH REGULAR EXERCISE.  I  ALSO STRESSED THE IMPORTANCE OF ANNUAL EYE EXAMS, SELF FOOT CARE AND COMPLIANCE WITH OFFICE VISITS.

## 2024-06-28 NOTE — Assessment & Plan Note (Signed)

## 2024-06-28 NOTE — Assessment & Plan Note (Signed)
 BMI 35 along with DM, HTN. She is encouraged to strive to lose ten percent of her body weight to decrease cardiac risk.

## 2024-06-28 NOTE — Assessment & Plan Note (Addendum)
 Calcium  score is 282. She is encouraged to continue with pravastatin  80mg  daily.  She has been unable to tolerate higher intensity statins in the past. She has also been prescribed Repatha  by Cardiology; however, she states it was never approved by her insurance.  - Refer to [facility] pharmacist for PA assistance.  - Start ASA 81mg  daily. - Follow heart healthy lifestyle.

## 2024-06-28 NOTE — Assessment & Plan Note (Signed)
 HR 110 on EKG. She is currently on CCB. She is encouraged to stay well hydrated and avoid caffeinated beverages.  - Consider Zio patch if she becomes asymptomatic - ? Need for Bblockade in addition to CCB

## 2024-06-28 NOTE — Assessment & Plan Note (Addendum)
 Chronic, well controlled.  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  She will continue with valsartan /hct 160/25mg  daily and diltiazem  XT 240mg  daily. . Encouraged to follow low sodium diet. - She will follow up in four months.

## 2024-06-28 NOTE — Assessment & Plan Note (Signed)
 Chronic, LDL goal is less than 55.  Management includes pravastatin  and Repatha . Repatha  not yet started due to pharmacy approval issues. Calcium  score of 282 indicates need for Repatha . - Continue pravastatin  daily. - Referred to pharmacist for Repatha  approval and administration training.

## 2024-06-29 ENCOUNTER — Telehealth: Payer: Self-pay | Admitting: Neurology

## 2024-06-29 LAB — HM DEXA SCAN

## 2024-06-29 NOTE — Telephone Encounter (Signed)
 Request to cx appointment, pt states not needed

## 2024-06-29 NOTE — Patient Instructions (Incomplete)

## 2024-06-29 NOTE — Progress Notes (Deleted)
 PATIENT: SOHA THORUP DOB: Nov 22, 1960  REASON FOR VISIT: follow up HISTORY FROM: patient  No chief complaint on file.    HISTORY OF PRESENT ILLNESS:  06/29/2024 ALL: Dollene returns for follow up for OSA on CPAP. She was last seen by Dr Chalice 06/2023. HST 02/2023 showed severe OSA with AHI 48/hr and O2 of 83%. New AutoPAP ordered.   07/31/2019 ALL:  Marguerita G List is a 63 y.o. female here today for follow up for OSA on CPAP. She reports doing well, overall. She has recently recovered from COVID. She was hospitalized for 2 weeks and was in rehab for about 11 days. Unfortunately, she developed DVT's while hospitalized and is now on Eliquis . She reports that she is not getting back to baseline. She continues to experience loss of smell. She has used CPAP consistently since being home but admits that while in the hospital and rehab, she did not use therapy. She does note benefit with therapy. She denies any concerns today.   Compliance report dated 06/27/2019 through 08/05/2019 reveals that she used CPAP 22 last 30 days for compliance of 73%.  21 days she used CPAP greater than 4 hours for compliance of 70%.  Average usage on days used was 5 hours and 56 minutes.  Residual AHI was 2.5 on 12 cm of water  and an EPR of 3.  There was no significant leak noted.    HISTORY: (copied from Dr Dohmeier's note on 08/07/2018)  HPI:  TANETTE CHAUCA is an afro-american  63 y.o. female patient, Seen in a RV on 08-07-2018. Since I had seen Mrs. Stakes last she had a prolonged leave of absence from her job since mid November, she underwent a hernia surgery.  She return to work on the fifth and needs, with her short-term disability came also a less compliant use at the same time of her CPAP machine.  She states that she noted a significant air leak almost a whistle and contacted [facility] by email but she has still not seen of supply of new equipment holes, headgear or mask.  She used the machine  last on 29 June 2018.  In the meantime there have been 5 weeks without CPAP use and I will write a note to [facility] today asking them to please send the supplies so that she can resume a more compliant use.  We discussed again that 4 hours of nightly use of the minimum use of time.  Her average daily usage on days used is 4 hours and 30 minutes, but she has an only 83% compliance by days and 60% compliance by time for October 2019.  The air leaks were indeed very high -between 35.9 L  and 29 L / minute.  She had very few residual apneas 2.7 and most of those are obstructive in origin.  There was no evidence of Cheyne-Stokes or central apnea.  The pressure is set at 12 cmH2O with 3 cm EPR.  Her device is auto titration capable.   FSS 31/ 63 points. Epworth sleepiness score 12 points- currently not using CPAP for 5-6 weeks.     She was seen here as in a referral from Dr. Jarold for a sleep evaluation. Mrs. Nehring is a patient of Dr. Zackary that underwent about 8 years ago a sleep study. The place where her sleep testing took place is no longer existing, reportedly it was close to [address], possible Dr. Michelina laboratory on [address]. She felt that  the CPAP was cumbersome but helpful. She felt less sleepy and her sleep may have been deeper but the machine was not easy to use or set up. She is here today partially to see if she still would need CPAP, is unclear what stage of apnea she may be in, and also to see if the different treatment options today that were not available in 2010.   Past medical history is positive for diabetic nephropathy, chronic kidney disease stage II, type 2 diabetes attention deficit disorder since childhood, hypertensive renal disease, allergic rhinitis, joint arthritis, superobesity and obstructive sleep apnea I also reviewed briefly her list of medications. Mrs. Duffy is postmenopausal, she does still have insomnia and her obstructive sleep apnea has been  untreated for years. The patient had been diagnosed with sleep apnea and was given a CPAP machine, but she was unable to obtain supplies and has not used the machine in 4 or 5 years. She has been more daytime sleepy but usually can control her sleepiness either by starting to walk around or looking for other stimulation.     Sleep habits are as follows: She usually watches TV for the last hour before retreating to the bedroom, between 11 and midnight. She does watches TV in the bedroom and uses her smart phone. She struggles to fall asleep, but once asleep only sleeps for about 2 or 3 hour intervals fragmented by bathroom breaks. On average 2 bathroom breaks at night. She does not have preferred sleep position, sleeps prone or supine or on the side. She sleeps with 2-3 pillows for support. She sometimes experiences vivid dreams, and she has woken up out of dreams but rarely with palpitations or diaphoresis. She sets her alarm between 5:30 and 6 AM, she uses the snooze function several times before finally rising. She always feels that she needs more sleep and isn't restored and refreshed yet. Her nocturnal sleep time averages about 5 hours. She may take a Sunday afternoon nap but does not nap during week days. Her naps will be power naps of 30 minutes duration.   Mrs. Lantier had  been admitted to hospital with pneumonia and septic shock at [facility]. I reviewed the notes from Marina  [place]az[place] , [place].  Mrs. Rincon was hospitalized on 11-26-2016.  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 Diagnosed by Dr. Karis.      Sleep medical history and family sleep history:  Mother snored, she's not aware of anybody being diagnosed with sleep apnea, her maternal grandmother suffered from heart disease in the paternal grandmother for breast cancer her mother died of pancreatic cancer she was in her early 21s. Her  father died of meningitis - very young in 1979, smoked and drank .   Social history: Mrs. Telleria is married, has one adult adopted son, she is a nonsmoker never used tobacco products she very seldomly drinks alcohol, she does use coffee in the mornings about 2 cups and sometimes tea or soda in the afternoons but not daily. She has no shift work history. She works at [employer] in [place], she is an tax inspector.   REVIEW OF SYSTEMS: Out of a complete 14 system review of symptoms, the patient complains only of the following symptoms, headaches and all other reviewed systems are negative.  Epworth sleepiness scale: 5 Fatigue severity scale: 34  ALLERGIES: Allergies  Allergen Reactions  Crab [Shellfish Allergy] Hives and Swelling    Only when I eat too many crab legs Can tolerate CT scans with contrast-does not need premeds      HOME MEDICATIONS: Outpatient Medications Prior to Visit  Medication Sig Dispense Refill   amphetamine -dextroamphetamine  (ADDERALL XR) 20 MG 24 hr capsule Take 1 capsule (20 mg total) by mouth daily. 30 capsule 0   amphetamine -dextroamphetamine  (ADDERALL XR) 20 MG 24 hr capsule Take 1 capsule (20 mg total) by mouth daily. 30 capsule 0   amphetamine -dextroamphetamine  (ADDERALL XR) 20 MG 24 hr capsule Take 1 capsule (20 mg total) by mouth daily. 30 capsule 0   amphetamine -dextroamphetamine  (ADDERALL XR) 20 MG 24 hr capsule Take 1 capsule (20 mg total) by mouth daily. 30 days after date prescribed 30 capsule 0   amphetamine -dextroamphetamine  (ADDERALL XR) 20 MG 24 hr capsule Take 1 capsule (20 mg total) by mouth daily. 60 days after date prescribed 30 capsule 0   amphetamine -dextroamphetamine  (ADDERALL XR) 30 MG 24 hr capsule Take 1 capsule by mouth daily 30 capsule 0   amphetamine -dextroamphetamine  (ADDERALL XR) 30 MG 24 hr capsule Take 1 capsule (30 mg total) by mouth daily. 30 capsule 0   amphetamine -dextroamphetamine  (ADDERALL XR) 30  MG 24 hr capsule Take 1 capsule (30 mg total) by mouth daily. 30 capsule 0   amphetamine -dextroamphetamine  (ADDERALL XR) 30 MG 24 hr capsule Take 1 capsule by mouth daily 30 capsule 0   amphetamine -dextroamphetamine  (ADDERALL XR) 30 MG 24 hr capsule Take 1 capsule (30 mg total) by mouth daily. 30 capsule 0   amphetamine -dextroamphetamine  (ADDERALL XR) 30 MG 24 hr capsule Take 1 capsule (30 mg total) by mouth daily. 60 days after date prescribed 30 capsule 0   amphetamine -dextroamphetamine  (ADDERALL XR) 30 MG 24 hr capsule Take 1 capsule (30 mg total) by mouth daily. 30 days after date prescribed 30 capsule 0   amphetamine -dextroamphetamine  (ADDERALL) 10 MG tablet Take 1 tablet by mouth daily in the afternoon. May be filled 30 days after date prescribed 30 tablet 0   amphetamine -dextroamphetamine  (ADDERALL) 10 MG tablet Take 1 tablet (10 mg total) by mouth daily in the afternoon. 30 tablet 0   amphetamine -dextroamphetamine  (ADDERALL) 10 MG tablet Take 1 tablet (10 mg total) by mouth daily in the afternoon 30 tablet 0   amphetamine -dextroamphetamine  (ADDERALL) 10 MG tablet Take 1 tablet (10 mg total) by mouth daily in the afternoon 30 tablet 0   amphetamine -dextroamphetamine  (ADDERALL) 10 MG tablet Take 1 tablet (10 mg total) by mouth daily in the afternoon 30 tablet 0   amphetamine -dextroamphetamine  (ADDERALL) 10 MG tablet Take 1 tablet (10 mg total) by mouth daily in the afternoon. 60 days after date prescribed 30 tablet 0   amphetamine -dextroamphetamine  (ADDERALL) 10 MG tablet Take 1 tablet (10 mg total) by mouth daily in the afternoon. 30 tablet 0   amphetamine -dextroamphetamine  (ADDERALL) 10 MG tablet Take 1 tablet (10 mg total) by mouth daily in the afternoon. 30 days after date prescribed 30 tablet 0   cholecalciferol  (VITAMIN D3) 25 MCG (1000 UT) tablet Take 1,000 Units by mouth daily.     Cyanocobalamin  (VITAMIN B-12 PO) Take 1 tablet by mouth once a week. Tuesdays     diazepam  (VALIUM ) 5 MG  tablet TAKE 1 TABLET BY MOUTH EVERY DAY AS NEEDED 20 tablet 0   diltiazem  (CARTIA  XT) 240 MG 24 hr capsule Take 1 capsule (240 mg total) by mouth daily. 90 capsule 2   EPINEPHrine  0.3 mg/0.3 mL IJ SOAJ injection Inject  0.3 mg into the muscle once as needed. Allergic reaction 1 each 3   Evolocumab  (REPATHA  SURECLICK) 140 MG/ML SOAJ Inject 140 mg into the skin every 14 (fourteen) days. 2 mL 6   famotidine  (PEPCID ) 20 MG tablet Take 20 mg by mouth as needed for heartburn or indigestion.     levocetirizine (XYZAL ) 5 MG tablet TAKE 1 TABLET BY MOUTH EVERY DAY IN THE EVENING 90 tablet 1   Levothyroxine  Sodium 175 MCG/ML SOLN Take 175 mcg by mouth daily. 30 mL 5   Magnesium  500 MG CAPS Take 500 mg by mouth daily.     pravastatin  (PRAVACHOL ) 80 MG tablet TAKE 1 TABLET BY MOUTH DAILY ON MONDAY THROUGH SUNDAY 75 tablet 2   promethazine  (PHENERGAN ) 25 MG tablet TAKE 1 TABLET BY MOUTH EVERY 8 HOURS AS NEEDED 30 tablet 0   Rimegepant Sulfate (NURTEC) 75 MG TBDP Take 1 tablet (75 mg total) by mouth daily as needed. 10 tablet 2   Semaglutide , 2 MG/DOSE, (OZEMPIC , 2 MG/DOSE,) 8 MG/3ML SOPN Inject 2mg  sq weekly 9 mL 2   TRINTELLIX  20 MG TABS tablet TAKE 1 TABLET BY MOUTH EVERY DAY 90 tablet 1   valsartan -hydrochlorothiazide  (DIOVAN -HCT) 160-25 MG tablet TAKE 1 TABLET BY MOUTH EVERY DAY 90 tablet 1   XIGDUO  XR 11-998 MG TB24 TAKE 1 TABLET BY MOUTH TWICE A DAY 60 tablet 2   No facility-administered medications prior to visit.    PAST MEDICAL HISTORY: Past Medical History:  Diagnosis Date   ADD (attention deficit disorder)    Anemia    Anxiety    Arthritis    left shoulder (07/07/2014)   Back pain    Breast cancer (HCC)    left   Depression    Fatty liver    Food allergy    shellfish   GERD (gastroesophageal reflux disease)    takes over the counters as needed   Hernia, umbilical    High cholesterol    Hypertension 11/26/2011   sees Dr. Grayce Slocumb   Hypothyroidism    Incisional hernia  without obstruction or gangrene 06/05/2018   Joint pain    Migraines    maybe once q 3 months (07/07/2014)   Multinodular thyroid  2015   OSA on CPAP    Personal history of radiation therapy    Pneumonia    May 2018   Sepsis due to Streptococcus pneumoniae (HCC)    Septic shock (HCC)    Severe sepsis (HCC) 06/05/2019   Thyroid  cancer (HCC)    2015   Type II diabetes mellitus (HCC)    Vitamin D  deficiency     PAST SURGICAL HISTORY: Past Surgical History:  Procedure Laterality Date   ABDOMINAL HYSTERECTOMY  ? 1997   BREAST BIOPSY Left 2020   BREAST EXCISIONAL BIOPSY Right 2014   BREAST LUMPECTOMY Left 2009   BREAST REDUCTION SURGERY Bilateral    DIAPHRAGM SURGERY  1986   trauma   INCISIONAL HERNIA REPAIR N/A 06/05/2018   Procedure: LAPAROSCOPIC INCISIONAL HERNIA ERAS PATHWAY;  Surgeon: Vanderbilt Ned, MD;  Location: [facility] OR;  Service: General;  Laterality: N/A;   INSERTION OF MESH N/A 06/05/2018   Procedure: INSERTION OF MESH;  Surgeon: Vanderbilt Ned, MD;  Location: [facility] OR;  Service: General;  Laterality: N/A;   PARTIAL MASTECTOMY WITH NEEDLE LOCALIZATION  08/12/2012   Procedure: PARTIAL MASTECTOMY WITH NEEDLE LOCALIZATION;  Surgeon: Ned LABOR. Cornett, MD;  Location: [facility] OR;  Service: General;  Laterality: Right;   REDUCTION MAMMAPLASTY Bilateral 1995  THYROIDECTOMY Left 07/07/2014   Procedure: LEFT HEMI-THYROIDECTOMY;  Surgeon: Ana LELON Moccasin, MD;  Location[facility] OR;  Service: ENT;  Laterality: Left;   THYROIDECTOMY Right 07/08/2014   Procedure: Completion THYROIDECTOMY;  Surgeon: Ana LELON Moccasin, MD;  Location[facility];  Service: ENT;  Laterality: Right;   THYROIDECTOMY, PARTIAL Left 07/07/2014   hemi    FAMILY HISTORY: Family History  Problem Relation Age of Onset   Cancer Mother        Pancreatic   Diabetes Mother    Hypertension Mother    Depression Mother    Hypertension Father    Alcoholism Father     SOCIAL HISTORY: Social History   Socioeconomic History    Marital status: Married    Spouse name: Cathryne   Number of children: Not on file   Years of education: Not on file   Highest education level: Master's degree (e.g., MA, MS, MEng, MEd, MSW, MBA)  Occupational History   Not on file  Tobacco Use   Smoking status: Never   Smokeless tobacco: Never  Vaping Use   Vaping status: Never Used  Substance and Sexual Activity   Alcohol use: Yes    Comment: 07/07/2014 a few times/year; weddings, anniversary, etc   Drug use: No   Sexual activity: Yes  Other Topics Concern   Not on file  Social History Narrative   Not on file   Social Drivers of Health   Tobacco Use: Low Risk (06/22/2024)   Patient History    Smoking Tobacco Use: Never    Smokeless Tobacco Use: Never    Passive Exposure: Not on file  Financial Resource Strain: Low Risk (06/22/2024)   Overall Financial Resource Strain (CARDIA)    Difficulty of Paying Living Expenses: Not very hard  Food Insecurity: No Food Insecurity (06/22/2024)   Epic    Worried About Radiation Protection Practitioner of Food in the Last Year: Never true    Ran Out of Food in the Last Year: Never true  Transportation Needs: No Transportation Needs (06/22/2024)   Epic    Lack of Transportation (Medical): No    Lack of Transportation (Non-Medical): No  Physical Activity: Inactive (06/22/2024)   Exercise Vital Sign    Days of Exercise per Week: 0 days    Minutes of Exercise per Session: Not on file  Stress: Stress Concern Present (06/22/2024)   [employer] of Occupational Health - Occupational Stress Questionnaire    Feeling of Stress: Rather much  Social Connections: Socially Integrated (06/22/2024)   Social Connection and Isolation Panel    Frequency of Communication with Friends and Family: Three times a week    Frequency of Social Gatherings with Friends and Family: Once a week    Attends Religious Services: More than 4 times per year    Active Member of Clubs or Organizations: Yes    Attends Banker  Meetings: More than 4 times per year    Marital Status: Married  Catering Manager Violence: Not At Risk (12/31/2023)   Epic    Fear of Current or Ex-Partner: No    Emotionally Abused: No    Physically Abused: No    Sexually Abused: No  Depression (PHQ2-9): Medium Risk (12/31/2023)   Depression (PHQ2-9)    PHQ-2 Score: 5  Alcohol Screen: Low Risk (06/22/2024)   Alcohol Screen    Last Alcohol Screening Score (AUDIT): 1  Housing: Low Risk (06/22/2024)   Epic    Unable to Pay for Housing in the  Last Year: No    Number of Times Moved in the Last Year: 0    Homeless in the Last Year: No  Utilities: Not At Risk (12/31/2023)   Epic    Threatened with loss of utilities: No  Health Literacy: Not on file      PHYSICAL EXAM  There were no vitals filed for this visit.  There is no height or weight on file to calculate BMI.  Generalized: Well developed, in no acute distress  Cardiology: normal rate and rhythm, no murmur noted Respiratory: Clear to auscultation bilaterally  Neurological examination  Mentation: Alert oriented to time, place, history taking. Follows all commands speech and language fluent Cranial nerve II-XII: Pupils were equal round reactive to light. Extraocular movements were full, visual field were full. Patient reports loss of smell due to Covid.  Motor: The motor testing reveals 5 over 5 strength of all 4 extremities. Good symmetric motor tone is noted throughout.  Sensory: Sensory testing is intact to soft touch on all 4 extremities. No evidence of extinction is noted.  Gait and station: Gait is normal.    DIAGNOSTIC DATA (LABS, IMAGING, TESTING) - I reviewed patient records, labs, notes, testing and imaging myself where available.      No data to display           Lab Results  Component Value Date   WBC 7.5 06/22/2024   HGB 13.1 06/22/2024   HCT 42.7 06/22/2024   MCV 77 (L) 06/22/2024   PLT 336 06/22/2024      Component Value Date/Time   NA 140  06/22/2024 1243   NA 143 05/28/2013 0844   K 4.5 06/22/2024 1243   K 4.4 05/28/2013 0844   CL 97 06/22/2024 1243   CL 99 11/20/2012 0940   CO2 27 06/22/2024 1243   CO2 28 05/28/2013 0844   GLUCOSE 108 (H) 06/22/2024 1243   GLUCOSE 94 06/15/2019 0234   GLUCOSE 94 05/28/2013 0844   GLUCOSE 142 (H) 11/20/2012 0940   BUN 19 06/22/2024 1243   BUN 16.9 05/28/2013 0844   CREATININE 1.13 (H) 06/22/2024 1243   CREATININE 0.8 05/28/2013 0844   CALCIUM  9.6 06/22/2024 1243   CALCIUM  9.8 05/28/2013 0844   PROT 7.9 06/22/2024 1243   PROT 7.9 05/28/2013 0844   ALBUMIN  4.4 06/22/2024 1243   ALBUMIN  3.7 05/28/2013 0844   AST 22 06/22/2024 1243   AST 15 05/28/2013 0844   ALT 25 06/22/2024 1243   ALT 23 05/28/2013 0844   ALKPHOS 62 06/22/2024 1243   ALKPHOS 58 05/28/2013 0844   BILITOT 0.2 06/22/2024 1243   BILITOT 0.28 05/28/2013 0844   GFRNONAA 71 08/10/2020 1457   GFRAA 81 08/10/2020 1457   Lab Results  Component Value Date   CHOL 161 06/22/2024   HDL 46 06/22/2024   LDLCALC 90 06/22/2024   TRIG 144 06/22/2024   CHOLHDL 3.5 06/22/2024   Lab Results  Component Value Date   HGBA1C 8.1 (H) 06/22/2024   Lab Results  Component Value Date   VITAMINB12 1,588 (H) 08/10/2020   Lab Results  Component Value Date   TSH 0.250 (L) 06/22/2024       ASSESSMENT AND PLAN 64 y.o. year old female  has a past medical history of ADD (attention deficit disorder), Anemia, Anxiety, Arthritis, Back pain, Breast cancer (HCC), Depression, Fatty liver, Food allergy, GERD (gastroesophageal reflux disease), Hernia, umbilical, High cholesterol, Hypertension (11/26/2011), Hypothyroidism, Incisional hernia without obstruction or gangrene (06/05/2018), Joint pain, Migraines,  Multinodular thyroid  (2015), OSA on CPAP, Personal history of radiation therapy, Pneumonia, Sepsis due to Streptococcus pneumoniae [facility]), Septic shock (HCC), Severe sepsis (HCC) (06/05/2019), Thyroid  cancer (HCC), Type II diabetes mellitus  (HCC), and Vitamin D  deficiency. here with   No diagnosis found.   Overall, Mrs. Salemi is doing very well with CPAP therapy.  Unfortunately, she was recently hospitalized and required rehab for 11 days following a Covid infection complicated by DVTs.  She has been seen by primary care and doing very well.  She denies any concerns today.  Compliance report reveals that she has used CPAP 73% of the time over the last 30 days.  She reports that prior to Covid she was much more consistent use.  She was encouraged to continue using CPAP nightly and greater than 4 hours each night.  She reports having all the supplies that she needs.  She will call us  with any concerns.  She will follow-up with me in 1 year, sooner if needed.  She verbalizes understanding and agreement with this plan.   No orders of the defined types were placed in this encounter.    No orders of the defined types were placed in this encounter.     Greig Forbes, FNP-C 06/29/2024, 11:15 AM [facility] [address], [address] [place], [place] [address] [phone]

## 2024-06-30 ENCOUNTER — Ambulatory Visit: Payer: BC Managed Care – PPO | Admitting: Family Medicine

## 2024-07-02 ENCOUNTER — Ambulatory Visit: Payer: Self-pay | Admitting: Internal Medicine

## 2024-07-03 ENCOUNTER — Other Ambulatory Visit: Payer: Self-pay

## 2024-07-03 ENCOUNTER — Other Ambulatory Visit (HOSPITAL_COMMUNITY): Payer: Self-pay

## 2024-07-07 ENCOUNTER — Telehealth: Payer: Self-pay | Admitting: Pharmacist

## 2024-07-07 DIAGNOSIS — E7800 Pure hypercholesterolemia, unspecified: Secondary | ICD-10-CM

## 2024-07-07 MED ORDER — REPATHA SURECLICK 140 MG/ML ~~LOC~~ SOAJ: 140.0000 mg | SUBCUTANEOUS | 6 refills | Status: AC

## 2024-07-07 NOTE — Progress Notes (Signed)
" ° °  07/07/2024 Name: Brittany Rubio MRN: [id number] DOB: 1960-08-23  Chief Complaint  Patient presents with   Medication Assistance    Repatha      Brittany Rubio is a 63 y.o. year old female who presented for a telephone visit.   They were referred to the pharmacist by their PCP for assistance in managing medication access with Repatha .   Subjective: Brittany Rubio is a 63 year old female with multiple medical conditions including but not limited to: attention deficit disorder, anxiety, obesity, insomnia, depression, hypertension, gastroesophageal reflux disease, hyperlipidemia, sleep apnea, and type 2 diabetes.  Care Team: Primary Care Provider: Jarold Medici, MD ; Next Scheduled Visit: 09/21/24   Medication Access/Adherence  Current Pharmacy:  [id number] [place], [place] - [address] AT [facility] [address] [place] [address] Phone: [phone] Fax: [phone]  [facility] [address] [place] [address] Phone: [phone] Fax: [phone]  [facility] [id number], [place] - [address] [place] [address] Phone: [phone] Fax: [phone]   Patient reports affordability concerns with their medications: Yes  Patient reports access/transportation concerns to their pharmacy: No  Patient reports adherence concerns with their medications:  Yes  Repatha  due to cost   Hyperlipidemia/ASCVD Risk Reduction  Current lipid lowering medications:  Pravastatin  80 mg daily Repatha   140  bi weekly  Lab Results  Component Value Date   HGBA1C 8.1 (H) 06/22/2024   HGBA1C 11.1 (H) 11/27/2023   HGBA1C 9.2 (H) 09/02/2023   HgA1c down from >11% but not at goal.  Xigudo XR 11-998 twice daily  Ozempic  2 mg weekly  Objective:  Lab Results  Component Value Date   HGBA1C 8.1 (H) 06/22/2024    Lab Results   Component Value Date   CREATININE 1.13 (H) 06/22/2024   BUN 19 06/22/2024   NA 140 06/22/2024   K 4.5 06/22/2024   CL 97 06/22/2024   CO2 27 06/22/2024    Lab Results  Component Value Date   CHOL 161 06/22/2024   HDL 46 06/22/2024   LDLCALC 90 06/22/2024   TRIG 144 06/22/2024   CHOLHDL 3.5 06/22/2024    Medications Reviewed Today   Medications were not reviewed in this encounter       Assessment/Plan:  Patient was previously approved to receive Repatha  through an approved prior authorization. She was unsure why she had not been able to get her prescription filled through her insurance.  [employer] was called. Representative in the prior authorization department confirmed the patient was approved through 03/10/24.  Looks like the [facility] but the insurance will not pay for Repatha  through that Pharmacy. The Representative ran a test claim with [facility] and was able to get a paid claim for $30.  [facility]. A [facility]ature [facility]. Jarold.   Cassius DOROTHA Brought, PharmD, BCACP Clinical Pharmacist [phone]         "

## 2024-07-08 ENCOUNTER — Ambulatory Visit (HOSPITAL_COMMUNITY)

## 2024-08-08 ENCOUNTER — Other Ambulatory Visit: Payer: Self-pay | Admitting: Internal Medicine

## 2024-09-21 ENCOUNTER — Ambulatory Visit: Admitting: Internal Medicine

## 2025-06-23 ENCOUNTER — Encounter: Admitting: Internal Medicine
